# Patient Record
Sex: Male | Born: 1960 | Race: White | Hispanic: No | Marital: Married | Smoking: Current every day smoker
Health system: Southern US, Community
[De-identification: ages and names within clinical notes are randomized; demographics above are authoritative.]

## PROBLEM LIST (undated history)

## (undated) DIAGNOSIS — M5030 Other cervical disc degeneration, unspecified cervical region: Secondary | ICD-10-CM

## (undated) DIAGNOSIS — E785 Hyperlipidemia, unspecified: Secondary | ICD-10-CM

## (undated) DIAGNOSIS — I739 Peripheral vascular disease, unspecified: Secondary | ICD-10-CM

## (undated) DIAGNOSIS — I10 Essential (primary) hypertension: Secondary | ICD-10-CM

## (undated) DIAGNOSIS — I639 Cerebral infarction, unspecified: Secondary | ICD-10-CM

## (undated) DIAGNOSIS — I69320 Aphasia following cerebral infarction: Secondary | ICD-10-CM

## (undated) DIAGNOSIS — N529 Male erectile dysfunction, unspecified: Secondary | ICD-10-CM

## (undated) HISTORY — DX: Male erectile dysfunction, unspecified: N52.9

## (undated) HISTORY — DX: Cerebral infarction, unspecified: I63.9

## (undated) HISTORY — DX: Aphasia following cerebral infarction: I69.320

## (undated) HISTORY — DX: Other cervical disc degeneration, unspecified cervical region: M50.30

## (undated) HISTORY — DX: Peripheral vascular disease, unspecified: I73.9

## (undated) HISTORY — DX: Hyperlipidemia, unspecified: E78.5

---

## 2005-09-14 ENCOUNTER — Emergency Department: Payer: Self-pay | Admitting: Emergency Medicine

## 2012-08-14 ENCOUNTER — Emergency Department: Payer: Self-pay | Admitting: Unknown Physician Specialty

## 2012-08-14 LAB — URINALYSIS, COMPLETE
Bacteria: NONE SEEN
Bilirubin,UR: NEGATIVE
Glucose,UR: NEGATIVE mg/dL (ref 0–75)
Nitrite: NEGATIVE
Protein: NEGATIVE
RBC,UR: <1 /HPF (ref 0–5)
WBC UR: <1 /HPF (ref 0–5)

## 2012-08-14 LAB — COMPREHENSIVE METABOLIC PANEL
Alkaline Phosphatase: 84 U/L (ref 50–136)
Anion Gap: 8 (ref 7–16)
Chloride: 103 mmol/L (ref 98–107)
EGFR (Non-African Amer.): >60
Glucose: 84 mg/dL (ref 65–99)
Osmolality: 274 (ref 275–301)
Potassium: 3.9 mmol/L (ref 3.5–5.1)
SGOT(AST): 34 U/L (ref 15–37)
SGPT (ALT): 46 U/L (ref 12–78)
Sodium: 138 mmol/L (ref 136–145)
Total Protein: 8.5 g/dL — ABNORMAL HIGH (ref 6.4–8.2)

## 2012-08-14 LAB — CBC
HCT: 49 % (ref 40.0–52.0)
MCH: 31.5 pg (ref 26.0–34.0)
RDW: 13.5 % (ref 11.5–14.5)
WBC: 12.1 10*3/uL — ABNORMAL HIGH (ref 3.8–10.6)

## 2012-08-14 LAB — TROPONIN I: Troponin-I: <0.02 ng/mL

## 2012-08-16 ENCOUNTER — Ambulatory Visit: Payer: Self-pay | Admitting: Emergency Medicine

## 2012-08-27 ENCOUNTER — Ambulatory Visit: Payer: Self-pay | Admitting: Emergency Medicine

## 2015-06-30 ENCOUNTER — Other Ambulatory Visit: Payer: Self-pay | Admitting: Vascular Surgery

## 2015-07-20 ENCOUNTER — Other Ambulatory Visit
Admission: RE | Admit: 2015-07-20 | Discharge: 2015-07-20 | Disposition: A | Discharge status: Home / Self Care | Payer: BLUE CROSS/BLUE SHIELD | Source: Ambulatory Visit | Attending: Vascular Surgery | Admitting: Vascular Surgery

## 2015-07-20 DIAGNOSIS — Z029 Encounter for administrative examinations, unspecified: Secondary | ICD-10-CM | POA: Diagnosis not present

## 2015-07-20 LAB — BUN: BUN: 11 mg/dL (ref 6–20)

## 2015-07-20 LAB — CREATININE, SERUM
CREATININE: 0.8 mg/dL (ref 0.61–1.24)
GFR calc Af Amer: >60 mL/min (ref >60)

## 2015-07-21 ENCOUNTER — Ambulatory Visit
Admission: RE | Admit: 2015-07-21 | Discharge: 2015-07-21 | Disposition: A | Discharge status: Home / Self Care | Payer: BLUE CROSS/BLUE SHIELD | Source: Ambulatory Visit | Attending: Vascular Surgery | Admitting: Vascular Surgery

## 2015-07-21 ENCOUNTER — Encounter
Admission: RE | Disposition: A | Discharge status: Home / Self Care | Payer: Self-pay | Source: Ambulatory Visit | Attending: Vascular Surgery

## 2015-07-21 DIAGNOSIS — E785 Hyperlipidemia, unspecified: Secondary | ICD-10-CM | POA: Diagnosis not present

## 2015-07-21 DIAGNOSIS — I10 Essential (primary) hypertension: Secondary | ICD-10-CM | POA: Insufficient documentation

## 2015-07-21 DIAGNOSIS — Z79899 Other long term (current) drug therapy: Secondary | ICD-10-CM | POA: Insufficient documentation

## 2015-07-21 DIAGNOSIS — F17200 Nicotine dependence, unspecified, uncomplicated: Secondary | ICD-10-CM | POA: Insufficient documentation

## 2015-07-21 DIAGNOSIS — I70213 Atherosclerosis of native arteries of extremities with intermittent claudication, bilateral legs: Secondary | ICD-10-CM | POA: Insufficient documentation

## 2015-07-21 DIAGNOSIS — I120 Hypertensive chronic kidney disease with stage 5 chronic kidney disease or end stage renal disease: Secondary | ICD-10-CM | POA: Diagnosis not present

## 2015-07-21 DIAGNOSIS — I708 Atherosclerosis of other arteries: Secondary | ICD-10-CM | POA: Insufficient documentation

## 2015-07-21 HISTORY — PX: PERIPHERAL VASCULAR CATHETERIZATION: SHX172C

## 2015-07-21 SURGERY — ABDOMINAL AORTOGRAM W/LOWER EXTREMITY
Wound class: Clean

## 2015-07-21 MED ORDER — CLOPIDOGREL BISULFATE 75 MG PO TABS
300.0000 mg | ORAL_TABLET | ORAL | Status: AC
  Administered 2015-07-21: 300 mg via ORAL

## 2015-07-21 MED ORDER — DEXTROSE 5 % IV SOLN
INTRAVENOUS | Status: AC
  Filled 2015-07-21 (×12): qty 1.5

## 2015-07-21 MED ORDER — METHYLPREDNISOLONE SODIUM SUCC 125 MG IJ SOLR: 125.0000 mg | INTRAMUSCULAR | Status: DC | PRN

## 2015-07-21 MED ORDER — HEPARIN (PORCINE) IN NACL 2-0.9 UNIT/ML-% IJ SOLN
INTRAMUSCULAR | Status: AC
  Filled 2015-07-21: qty 1000

## 2015-07-21 MED ORDER — HYDROMORPHONE HCL 1 MG/ML IJ SOLN: 1.0000 mg | Freq: Once | INTRAMUSCULAR | Status: DC

## 2015-07-21 MED ORDER — DEXTROSE 5 % IV SOLN
1.5000 g | INTRAVENOUS | Status: AC
  Administered 2015-07-21: 1.5 g via INTRAVENOUS

## 2015-07-21 MED ORDER — FENTANYL CITRATE (PF) 100 MCG/2ML IJ SOLN
INTRAMUSCULAR | Status: DC | PRN
  Administered 2015-07-21: 50 ug via INTRAVENOUS
  Administered 2015-07-21: 100 ug via INTRAVENOUS
  Administered 2015-07-21: 50 ug via INTRAVENOUS

## 2015-07-21 MED ORDER — CLOPIDOGREL BISULFATE 75 MG PO TABS: 75.0000 mg | ORAL_TABLET | Freq: Every day | ORAL | Status: DC

## 2015-07-21 MED ORDER — MIDAZOLAM HCL 5 MG/5ML IJ SOLN
INTRAMUSCULAR | Status: AC
  Filled 2015-07-21: qty 5

## 2015-07-21 MED ORDER — FAMOTIDINE 20 MG PO TABS: 40.0000 mg | ORAL_TABLET | ORAL | Status: DC | PRN

## 2015-07-21 MED ORDER — SODIUM CHLORIDE 0.9 % IJ SOLN
INTRAMUSCULAR | Status: AC
  Filled 2015-07-21: qty 15

## 2015-07-21 MED ORDER — HEPARIN SODIUM (PORCINE) 1000 UNIT/ML IJ SOLN
INTRAMUSCULAR | Status: AC
  Filled 2015-07-21: qty 1

## 2015-07-21 MED ORDER — HEPARIN SODIUM (PORCINE) 1000 UNIT/ML IJ SOLN
INTRAMUSCULAR | Status: DC | PRN
  Administered 2015-07-21: 4000 [IU] via INTRAVENOUS

## 2015-07-21 MED ORDER — LIDOCAINE-EPINEPHRINE 1 %-1:100000 IJ SOLN
INTRAMUSCULAR | Status: DC | PRN
Start: 2015-07-21 — End: 2015-07-21
  Administered 2015-07-21: 10 mL

## 2015-07-21 MED ORDER — FENTANYL CITRATE (PF) 100 MCG/2ML IJ SOLN
INTRAMUSCULAR | Status: AC
  Filled 2015-07-21: qty 2

## 2015-07-21 MED ORDER — IOHEXOL 300 MG/ML  SOLN
INTRAMUSCULAR | Status: DC | PRN
Start: 2015-07-21 — End: 2015-07-21
  Administered 2015-07-21: 65 mL via INTRA_ARTERIAL

## 2015-07-21 MED ORDER — CLOPIDOGREL BISULFATE 75 MG PO TABS
ORAL_TABLET | ORAL | Status: AC
  Filled 2015-07-21: qty 4

## 2015-07-21 MED ORDER — ONDANSETRON HCL 4 MG/2ML IJ SOLN: 4.0000 mg | Freq: Four times a day (QID) | INTRAMUSCULAR | Status: DC | PRN

## 2015-07-21 MED ORDER — SODIUM CHLORIDE 0.9 % IV SOLN
INTRAVENOUS | Status: DC
Start: 2015-07-21 — End: 2015-07-21
  Administered 2015-07-21 (×2): via INTRAVENOUS

## 2015-07-21 MED ORDER — MIDAZOLAM HCL 2 MG/2ML IJ SOLN
INTRAMUSCULAR | Status: DC | PRN
  Administered 2015-07-21: 2 mg via INTRAVENOUS
  Administered 2015-07-21: 1 mg via INTRAVENOUS
  Administered 2015-07-21: 2 mg via INTRAVENOUS

## 2015-07-21 MED ORDER — LIDOCAINE HCL (PF) 1 % IJ SOLN
INTRAMUSCULAR | Status: AC
  Filled 2015-07-21: qty 30

## 2015-07-21 SURGICAL SUPPLY — 17 items
BALLN LUTONIX DCB 5X60X130 (BALLOONS) ×5
BALLN ULTRVRSE 6X60X130C (BALLOONS) ×5
BALLOON LUTONIX DCB 5X60X130 (BALLOONS) ×3 IMPLANT
BALLOON ULTRVRSE 6X60X130C (BALLOONS) ×3 IMPLANT
CATH PIG 70CM (CATHETERS) ×5 IMPLANT
DEVICE CLOSURE MYNXGRIP 5F (Vascular Products) ×5 IMPLANT
DEVICE PRESTO INFLATION (MISCELLANEOUS) ×5 IMPLANT
GLIDEWIRE ANGLED SS 035X260CM (WIRE) ×5 IMPLANT
LIFESTENT 6X60X130 (Permanent Stent) ×5 IMPLANT
PACK ANGIOGRAPHY (CUSTOM PROCEDURE TRAY) ×5 IMPLANT
SET INTRO CAPELLA COAXIAL (SET/KITS/TRAYS/PACK) ×5 IMPLANT
SHEATH BALKIN 6FR (SHEATH) ×5 IMPLANT
SHEATH BRITE TIP 5FRX11 (SHEATH) ×5 IMPLANT
SYR MEDRAD MARK V 150ML (SYRINGE) ×5 IMPLANT
TUBING CONTRAST HIGH PRESS 72 (TUBING) ×5 IMPLANT
WIRE J 3MM .035X145CM (WIRE) ×5 IMPLANT
WIRE MAGIC TORQUE 260C (WIRE) ×5 IMPLANT

## 2015-07-21 NOTE — Progress Notes (Signed)
Patient stable following procedure.  Voices no c/o of pain or discomfort.  Right groin site dressing C,D, and I.  Site w/o any swelling or drainage.  Dr. Gilda CreaseSchnier into see patient and talk with him.  Discharge instructions reviewed.  All questions answered.  No distress noted.  Wife into see patient and discharge instructions reviewed with her.

## 2015-07-21 NOTE — H&P (Signed)
[facility] History & Physical Update  The patient was interviewed and re-examined.  The patient's previous History and Physical has been reviewed and is unchanged.  There is no change in the plan of care. We plan to proceed with the scheduled procedure.  Tamarcus Condie, Latina CraverGregory G, MD  07/21/2015, 8:22 AM

## 2015-07-21 NOTE — Discharge Instructions (Signed)
Angiogram, Care After °Refer to this sheet in the next few weeks. These instructions provide you with information about caring for yourself after your procedure. Your health care provider may also give you more specific instructions. Your treatment has been planned according to current medical practices, but problems sometimes occur. Call your health care provider if you have any problems or questions after your procedure. °WHAT TO EXPECT AFTER THE PROCEDURE °After your procedure, it is typical to have the following: °· Bruising at the catheter insertion site that usually fades within 1-2 weeks. °· Blood collecting in the tissue (hematoma) that may be painful to the touch. It should usually decrease in size and tenderness within 1-2 weeks. °HOME CARE INSTRUCTIONS °· Take medicines only as directed by your health care provider. °· You may shower 24-48 hours after the procedure or as directed by your health care provider. Remove the bandage (dressing) and gently wash the site with plain soap and water. Pat the area dry with a clean towel. Do not rub the site, because this may cause bleeding. °· Do not take baths, swim, or use a hot tub until your health care provider approves. °· Check your insertion site every day for redness, swelling, or drainage. °· Do not apply powder or lotion to the site. °· Do not lift over 10 lb (4.5 kg) for 5 days after your procedure or as directed by your health care provider. °· Ask your health care provider when it is okay to: °¨ Return to work or school. °¨ Resume usual physical activities or sports. °¨ Resume sexual activity. °· Do not drive home if you are discharged the same day as the procedure. Have someone else drive you. °· You may drive 24 hours after the procedure unless otherwise instructed by your health care provider. °· Do not operate machinery or power tools for 24 hours after the procedure or as directed by your health care provider. °· If your procedure was done as an  outpatient procedure, which means that you went home the same day as your procedure, a responsible adult should be with you for the first 24 hours after you arrive home. °· Keep all follow-up visits as directed by your health care provider. This is important. °SEEK MEDICAL CARE IF: °· You have a fever. °· You have chills. °· You have increased bleeding from the catheter insertion site. Hold pressure on the site. °SEEK IMMEDIATE MEDICAL CARE IF: °· You have unusual pain at the catheter insertion site. °· You have redness, warmth, or swelling at the catheter insertion site. °· You have drainage (other than a small amount of blood on the dressing) from the catheter insertion site. °· The catheter insertion site is bleeding, and the bleeding does not stop after 30 minutes of holding steady pressure on the site. °· The area near or just beyond the catheter insertion site becomes pale, cool, tingly, or numb. °  °This information is not intended to replace advice given to you by your health care provider. Make sure you discuss any questions you have with your health care provider. °  °Document Released: 01/20/2005 Document Revised: 07/25/2014 Document Reviewed: 12/05/2012 °Elsevier Interactive Patient Education ©2016 [employer] ° °

## 2015-07-21 NOTE — Op Note (Signed)
Jonathan [name] Percutaneous Study/Intervention Procedural Note   Date of Surgery: 07/21/2015  Surgeon:  Katha Cabal, MD.  Pre-operative Diagnosis: Atherosclerotic occlusive disease bilateral lower extremities with lifestyle limiting claudication  Post-operative diagnosis: Same  Procedure(s) Performed: 1. Introduction catheter into left lower extremity 3rd order catheter placement  2. Contrast injection left lower extremity for distal runoff   3. Percutaneous transluminal angioplasty and stent placement left external iliac artery 4. Minx closure right common femoral arteriotomy  Anesthesia: Conscious sedation with IV Versed and fentanyl  Sheath: 6 [place] Balkan  Contrast: 65 cc  Fluoroscopy Time: 5.5 minutes  Indications: Jonathan [name] presents with lifestyle limiting claudication left leg is worse than the right. The risks and benefits are reviewed all questions answered patient agrees to proceed.  Procedure: Jonathan [name] is a 55 y.o. y.o. male who was identified and appropriate procedural time out was performed. The patient was then placed supine on the table and prepped and draped in the usual sterile fashion.   Ultrasound was placed in the sterile sleeve and the right groin was evaluated the right common femoral artery was echolucent and pulsatile indicating patency.  Image was recorded for the permanent record and under real-time visualization a microneedle was inserted into the common femoral artery microwire followed by a micro-sheath.  A J-wire was then advanced through the micro-sheath and a  5 [place] sheath was then inserted over a J-wire. J-wire was then advanced and a 5 French pigtail catheter was positioned at the level of T12. AP projection of the aorta was then obtained. Pigtail catheter was repositioned to above the bifurcation and a RAO view of the pelvis was obtained.   Subsequently a pigtail catheter with the stiff angle Glidewire was used to cross the aortic bifurcation the catheter wire were advanced down into the left distal external iliac artery. Oblique view of the femoral bifurcation was then obtained and subsequently the wire was reintroduced and the pigtail catheter negotiated into the SFA representing third order catheter placement. Distal runoff was then performed.  5000 units of heparin was then given and allowed to circulate and a 6 [place] Balkan sheath was advanced up and over the bifurcation and positioned in the femoral artery  A 5 x 60 Lutonix balloon was utilized inflating to 12 atm for 2 full minutes. Follow-up imaging demonstrated greater than 50% residual stenosis and therefore a 6 x 60 life stent was deployed and subsequently postdilated with a 6 x 60 balloon. Distal runoff was then reassessed.  After review of these images the sheath is pulled into the right external iliac oblique of the common femoral is obtained and a minx device deployed. There no immediate Complications.  Findings: Abdominal aorta is opacified with a bolus injection contrast. There are no hemodynamically significant lesions noted. Bilateral common iliacs are patent without hemodynamically significant stenosis. The external iliac arteries are noted to have high-grade stenosis bilaterally with a long segment string sign on the left and a more focal 70-80% narrowing on the right right at the ileo-inguinal ligament.  Left common femoral SFA popliteal and profunda femoris are widely patent. There appears to be three-vessel runoff to the foot although flow is very sluggish at the level of the ankle visualization is poor. Trifurcation however is widely patent.  The left external iliac is then angioplasty with a 5 mm Lutonix balloon subsequently a stent is placed for greater than 50% residual stenosis and postdilated to 6 mm with an excellent result  area  Right external iliac  lesion is not treated as it is too close to the actual puncture site to allow for safe treatment should the patient desired treatment of his right lower extremity I would wait a few months and then approach this lesion from the left femoral approach.   Disposition: Patient was taken to the recovery room in stable condition having tolerated the procedure well.  [name], Jonathan Lory 07/21/2015, 9:16 am

## 2015-07-22 ENCOUNTER — Encounter: Payer: Self-pay | Admitting: Vascular Surgery

## 2015-12-01 ENCOUNTER — Ambulatory Visit
Admission: RE | Admit: 2015-12-01 | Discharge: 2015-12-01 | Disposition: A | Discharge status: Home / Self Care | Payer: BLUE CROSS/BLUE SHIELD | Source: Ambulatory Visit | Attending: Family Medicine | Admitting: Family Medicine

## 2015-12-01 ENCOUNTER — Other Ambulatory Visit: Payer: Self-pay | Admitting: Family Medicine

## 2015-12-01 DIAGNOSIS — M542 Cervicalgia: Secondary | ICD-10-CM | POA: Insufficient documentation

## 2016-01-07 DIAGNOSIS — M542 Cervicalgia: Secondary | ICD-10-CM | POA: Insufficient documentation

## 2016-01-07 DIAGNOSIS — M5030 Other cervical disc degeneration, unspecified cervical region: Secondary | ICD-10-CM | POA: Insufficient documentation

## 2016-08-15 ENCOUNTER — Other Ambulatory Visit (INDEPENDENT_AMBULATORY_CARE_PROVIDER_SITE_OTHER): Payer: Self-pay | Admitting: Vascular Surgery

## 2016-10-25 DIAGNOSIS — I739 Peripheral vascular disease, unspecified: Secondary | ICD-10-CM | POA: Insufficient documentation

## 2017-03-16 ENCOUNTER — Other Ambulatory Visit (INDEPENDENT_AMBULATORY_CARE_PROVIDER_SITE_OTHER): Payer: Self-pay | Admitting: Vascular Surgery

## 2017-06-15 ENCOUNTER — Other Ambulatory Visit (INDEPENDENT_AMBULATORY_CARE_PROVIDER_SITE_OTHER): Payer: Self-pay | Admitting: Vascular Surgery

## 2019-09-17 ENCOUNTER — Other Ambulatory Visit: Payer: Self-pay

## 2019-09-17 ENCOUNTER — Emergency Department: Payer: BC Managed Care – PPO

## 2019-09-17 ENCOUNTER — Encounter: Payer: Self-pay | Admitting: Emergency Medicine

## 2019-09-17 ENCOUNTER — Emergency Department
Admission: EM | Admit: 2019-09-17 | Discharge: 2019-09-17 | Disposition: A | Discharge status: Home / Self Care | Payer: BC Managed Care – PPO | Attending: Emergency Medicine | Admitting: Emergency Medicine

## 2019-09-17 DIAGNOSIS — Z79899 Other long term (current) drug therapy: Secondary | ICD-10-CM | POA: Insufficient documentation

## 2019-09-17 DIAGNOSIS — Z7901 Long term (current) use of anticoagulants: Secondary | ICD-10-CM | POA: Diagnosis not present

## 2019-09-17 DIAGNOSIS — I63412 Cerebral infarction due to embolism of left middle cerebral artery: Secondary | ICD-10-CM | POA: Diagnosis not present

## 2019-09-17 DIAGNOSIS — I10 Essential (primary) hypertension: Secondary | ICD-10-CM | POA: Diagnosis not present

## 2019-09-17 DIAGNOSIS — F17210 Nicotine dependence, cigarettes, uncomplicated: Secondary | ICD-10-CM | POA: Diagnosis not present

## 2019-09-17 DIAGNOSIS — R4781 Slurred speech: Secondary | ICD-10-CM | POA: Diagnosis present

## 2019-09-17 DIAGNOSIS — Z20822 Contact with and (suspected) exposure to covid-19: Secondary | ICD-10-CM | POA: Diagnosis not present

## 2019-09-17 HISTORY — DX: Essential (primary) hypertension: I10

## 2019-09-17 LAB — PROTIME-INR
INR: 0.9 (ref 0.8–1.2)
Prothrombin Time: 12 seconds (ref 11.4–15.2)

## 2019-09-17 LAB — DIFFERENTIAL
Abs Immature Granulocytes: 0.04 10*3/uL (ref 0.00–0.07)
Basophils Absolute: 0.1 10*3/uL (ref 0.0–0.1)
Basophils Relative: 1 %
Eosinophils Absolute: 0.1 10*3/uL (ref 0.0–0.5)
Eosinophils Relative: 1 %
Immature Granulocytes: 0 %
Lymphocytes Relative: 36 %
Lymphs Abs: 3.3 10*3/uL (ref 0.7–4.0)
Monocytes Absolute: 0.9 10*3/uL (ref 0.1–1.0)
Monocytes Relative: 10 %
Neutro Abs: 4.7 10*3/uL (ref 1.7–7.7)
Neutrophils Relative %: 52 %

## 2019-09-17 LAB — CBC
HCT: 51.5 % (ref 39.0–52.0)
Hemoglobin: 17.7 g/dL — ABNORMAL HIGH (ref 13.0–17.0)
MCH: 31.6 pg (ref 26.0–34.0)
MCHC: 34.4 g/dL (ref 30.0–36.0)
MCV: 92 fL (ref 80.0–100.0)
Platelets: 238 10*3/uL (ref 150–400)
RBC: 5.6 MIL/uL (ref 4.22–5.81)
RDW: 12.7 % (ref 11.5–15.5)
WBC: 9.1 10*3/uL (ref 4.0–10.5)
nRBC: 0 % (ref 0.0–0.2)

## 2019-09-17 LAB — COMPREHENSIVE METABOLIC PANEL
ALT: 55 U/L — ABNORMAL HIGH (ref 0–44)
AST: 48 U/L — ABNORMAL HIGH (ref 15–41)
Albumin: 4.5 g/dL (ref 3.5–5.0)
Alkaline Phosphatase: 59 U/L (ref 38–126)
Anion gap: 13 (ref 5–15)
BUN: 11 mg/dL (ref 6–20)
CO2: 26 mmol/L (ref 22–32)
Calcium: 9.9 mg/dL (ref 8.9–10.3)
Chloride: 101 mmol/L (ref 98–111)
Creatinine, Ser: 0.82 mg/dL (ref 0.61–1.24)
GFR calc Af Amer: >60 mL/min (ref >60)
GFR calc non Af Amer: >60 mL/min (ref >60)
Glucose, Bld: 146 mg/dL — ABNORMAL HIGH (ref 70–99)
Potassium: 3.9 mmol/L (ref 3.5–5.1)
Sodium: 140 mmol/L (ref 135–145)
Total Bilirubin: 0.7 mg/dL (ref 0.3–1.2)
Total Protein: 8.2 g/dL — ABNORMAL HIGH (ref 6.5–8.1)

## 2019-09-17 LAB — SARS CORONAVIRUS 2 (TAT 6-24 HRS): SARS Coronavirus 2: NEGATIVE

## 2019-09-17 LAB — GLUCOSE, CAPILLARY: Glucose-Capillary: 129 mg/dL — ABNORMAL HIGH (ref 70–99)

## 2019-09-17 LAB — APTT: aPTT: 31 seconds (ref 24–36)

## 2019-09-17 MED ORDER — ASPIRIN EC 325 MG PO TBEC: 325.0000 mg | DELAYED_RELEASE_TABLET | Freq: Every day | ORAL | 0 refills | Status: DC

## 2019-09-17 MED ORDER — ASPIRIN 81 MG PO CHEW
324.0000 mg | CHEWABLE_TABLET | Freq: Once | ORAL | Status: AC
  Administered 2019-09-17: 324 mg via ORAL
  Filled 2019-09-17: qty 4

## 2019-09-17 MED ORDER — SODIUM CHLORIDE 0.9% FLUSH: 3.0000 mL | Freq: Once | INTRAVENOUS | Status: DC

## 2019-09-17 NOTE — ED Notes (Signed)
Patient on phone with MRI tech at this time 

## 2019-09-17 NOTE — ED Provider Notes (Signed)
[facility] Emergency Department Provider Note  ____________________________________________  Time seen: Approximately 1:11 PM  I have reviewed the triage vital signs and the nursing notes.   HISTORY  Chief Complaint Aphasia  Level 5 Caveat: Portions of the History and Physical including HPI and review of systems are unable to be completely obtained due to patient being a poor historian  Additional history obtained from wife at bedside   HPI Jonathan [name] is a 59 y.o. male with a history of hypertension who comes the ED complaining of slurred speech, incoherent speech.   Wife reports this is been going on for 2 days, intermittent, waxing waning, no aggravating or alleviating factors.  Patient denies any lateralizing weakness vision changes or headaches.  No trauma.  Wife notes that during these episodes he appears to have some facial droop as well but she does not remember which side  Wife notes that patient's been working extra shifts lately because 2 of his coworkers have had Covid and so he has been working extra while they are out.   Past Medical History:  Diagnosis Date  . Hypertension      There are no problems to display for this patient.    Past Surgical History:  Procedure Laterality Date  . PERIPHERAL VASCULAR CATHETERIZATION N/A 07/21/2015   Procedure: Abdominal Aortogram w/Lower Extremity;  Surgeon: Katha Cabal, MD;  Location: [facility] [facility];  Service: Cardiovascular;  Laterality: N/A;  . PERIPHERAL VASCULAR CATHETERIZATION  07/21/2015   Procedure: Lower Extremity Intervention;  Surgeon: Katha Cabal, MD;  Location: [place] [facility];  Service: Cardiovascular;;     Prior to Admission medications   Medication Sig Start Date End Date Taking? Authorizing Provider  amLODipine-benazepril (LOTREL) 10-20 MG capsule Take 1 capsule by mouth daily.   Yes [provider]  atenolol (TENORMIN) 50 MG tablet Take 50 mg by  mouth daily.   Yes [provider]  atorvastatin (LIPITOR) 40 MG tablet Take 40 mg by mouth daily. 04/29/19  Yes [provider]  clopidogrel (PLAVIX) 75 MG tablet TAKE 1 TABLET BY MOUTH EVERY DAY 06/15/17  Yes [name], [name], MD  aspirin EC 325 MG tablet Take 1 tablet (325 mg total) by mouth daily. 09/17/19   Carrie Mew, MD     Allergies Patient has no known allergies.   No family history on file.  Social History Social History   Tobacco Use  . Smoking status: Current Every Day Smoker    Types: Cigarettes  . Smokeless tobacco: Never Used  Substance Use Topics  . Alcohol use: Not on file  . Drug use: Not on file    Review of Systems  Constitutional:   No fever or chills.  ENT:   No sore throat. No rhinorrhea. Cardiovascular:   No chest pain or syncope. Respiratory:   No dyspnea or cough. Gastrointestinal:   Negative for abdominal pain, vomiting and diarrhea.  Musculoskeletal:   Negative for focal pain or swelling All other systems reviewed and are negative except as documented above in ROS and HPI.  ____________________________________________   PHYSICAL EXAM:  VITAL SIGNS: ED Triage Vitals  Enc Vitals Group     BP 09/17/19 0925 (!) 199/96     Pulse Rate 09/17/19 0925 88     Resp 09/17/19 0925 16     Temp 09/17/19 0925 98.2 F (36.8 C)     Temp Source 09/17/19 0925 Oral     SpO2 09/17/19 0925 97 %  Weight 09/17/19 0922 177 lb 14.6 oz (80.7 kg)     Height 09/17/19 0922 5\' 7"  (1.702 m)     Head Circumference --      Peak Flow --      Pain Score 09/17/19 0922 0     Pain Loc --      Pain Edu? --      Excl. in GC? --     Vital signs reviewed, nursing assessments reviewed.   Constitutional:   Alert and oriented. Non-toxic appearance. Eyes:   Conjunctivae are normal. EOMI. PERRL. ENT      Head:   Normocephalic and atraumatic.      Nose:   Wearing a mask.      Mouth/Throat:   Wearing a mask.      Neck:   No meningismus. Full  ROM. Hematological/Lymphatic/Immunilogical:   No cervical lymphadenopathy. Cardiovascular:   RRR. Symmetric bilateral radial and DP pulses.  No murmurs. Cap refill less than 2 seconds. Respiratory:   Normal respiratory effort without tachypnea/retractions. Breath sounds are clear and equal bilaterally. No wheezes/rales/rhonchi. Gastrointestinal:   Soft and nontender. Non distended. There is no CVA tenderness.  No rebound, rigidity, or guarding. Genitourinary:   deferred Musculoskeletal:   Normal range of motion in all extremities. No joint effusions.  No lower extremity tenderness.  No edema. Neurologic:   Normal speech and language.  Cranial nerves III through XII intact Motor grossly intact.  No drift Normal finger-to-nose, normal heel-to-shin, normal gait No acute focal neurologic deficits are appreciated.  Skin:    Skin is warm, dry and intact. No rash noted.  No petechiae, purpura, or bullae.  ____________________________________________    LABS (pertinent positives/negatives) (all labs ordered are listed, but only abnormal results are displayed) Labs Reviewed  CBC - Abnormal; Notable for the following components:      Result Value   Hemoglobin 17.7 (*)    All other components within normal limits  COMPREHENSIVE METABOLIC PANEL - Abnormal; Notable for the following components:   Glucose, Bld 146 (*)    Total Protein 8.2 (*)    AST 48 (*)    ALT 55 (*)    All other components within normal limits  GLUCOSE, CAPILLARY - Abnormal; Notable for the following components:   Glucose-Capillary 129 (*)    All other components within normal limits  SARS CORONAVIRUS 2 (TAT 6-24 HRS)  PROTIME-INR  APTT  DIFFERENTIAL  CBG MONITORING, ED   ____________________________________________   EKG  interpreted by me Sinus rhythm rate of 83, normal axis and intervals.  Poor R wave progression.  Normal ST segments and T waves.  No ischemic  changes.  ____________________________________________    RADIOLOGY  CT HEAD WO CONTRAST  Result Date: 09/17/2019 CLINICAL DATA:  59 year old male with history of focal neurologic deficit. Suspected stroke. EXAM: CT HEAD WITHOUT CONTRAST TECHNIQUE: Contiguous axial images were obtained from the base of the skull through the vertex without intravenous contrast. COMPARISON:  No priors. FINDINGS: Brain: Patchy and confluent areas of decreased attenuation are noted throughout the deep and periventricular white matter of the cerebral hemispheres bilaterally, compatible with chronic microvascular ischemic disease. No evidence of acute infarction, hemorrhage, hydrocephalus, extra-axial collection or mass lesion/mass effect. Vascular: No hyperdense vessel or unexpected calcification. Skull: Normal. Negative for fracture or focal lesion. Sinuses/Orbits: No acute finding. Other: None. IMPRESSION: 1. No acute intracranial abnormalities. 2. Mild chronic microvascular ischemic changes in the cerebral white matter, as above. Electronically Signed   By: 41  Entrikin M.D.   On: 09/17/2019 09:48   MR BRAIN WO CONTRAST  Result Date: 09/17/2019 CLINICAL DATA:  Transient ischemic attack (TIA). Additional history provided: Patient with slurred speech for 2 days EXAM: MRI HEAD WITHOUT CONTRAST TECHNIQUE: Multiplanar, multiecho pulse sequences of the brain and surrounding structures were obtained without intravenous contrast. COMPARISON:  Head CT 09/17/2019 FINDINGS: Brain: There are multiple small foci of restricted diffusion consistent with acute infarction within the left frontoparietal white matter. The largest infarct within the left parietal white matter measures 15 mm. Corresponding T2/FLAIR hyperintensity at these sites. No evidence of hemorrhagic conversion. No significant mass effect. No midline shift or extra-axial fluid collection. No evidence of intracranial mass. There is a small chronic cortical infarct  within the left middle frontal gyrus (series 15, images 37-38). Mild generalized parenchymal atrophy. Vascular: Flow voids maintained within the proximal large arterial vessels. Skull and upper cervical spine: No focal marrow lesion. Sinuses/Orbits: Visualized orbits demonstrate no acute abnormality. Minimal paranasal sinus mucosal thickening. No significant mastoid effusion. IMPRESSION: Multiple small acute infarcts within the left frontoparietal white matter within the left MCA vascular territory. These infarcts measure up to 15 mm. No hemorrhagic conversion or significant mass effect. Small chronic cortical infarct within the left superior frontal gyrus. Mild generalized parenchymal atrophy. Electronically Signed   By: Jackey Loge DO   On: 09/17/2019 12:40    ____________________________________________   PROCEDURES Procedures  ____________________________________________  DIFFERENTIAL DIAGNOSIS   Acute ischemic stroke, fatigue, anxiety  CLINICAL IMPRESSION / ASSESSMENT AND PLAN / ED COURSE  Medications ordered in the ED: Medications  aspirin chewable tablet 324 mg (324 mg Oral Given 09/17/19 1309)    Pertinent labs & imaging results that were available during my care of the patient were reviewed by me and considered in my medical decision making (see chart for details).  Jonathan [name] was evaluated in Emergency Department on 09/17/2019 for the symptoms described in the history of present illness. He was evaluated in the context of the global COVID-19 pandemic, which necessitated consideration that the patient might be at risk for infection with the SARS-CoV-2 virus that causes COVID-19. Institutional protocols and algorithms that pertain to the evaluation of patients at risk for COVID-19 are in a state of rapid change based on information released by regulatory bodies including the CDC and federal and state organizations. These policies and algorithms were followed during the patient's  care in the ED.     Clinical Course as of Sep 16 1316  Tue Sep 17, 2019  1052 CT head is unremarkable. Labs unremarkable. He is neurologically intact on exam, back to baseline according to spouse. Possible TIA versus fatigue versus anxiety attacks. I will obtain an MRI of the brain, if no signs of stroke with plan to discharge to outpatient follow-up.   [PS]    Clinical Course User Index [PS] Sharman Cheek, MD     ----------------------------------------- 1:17 PM on 09/17/2019 -----------------------------------------  MRI positive for multiple small acute strokes in the left MCA distribution, concerning for embolic phenomena versus a single stroke that dispersed in the small vessels.  Recommended admission given this finding for further work-up and telemetry monitoring, patient declines wishes to go home.  We will start him on full dose aspirin, strongly encouraged him to follow-up with neurology this week for further assessment, return precautions for any worsening or recurrent symptoms to come back to the ED via 911 immediately.  He does have medical decision-making capacity at this time, and is  in company of his wife, and and they both agree that he would not be admitted right now and chooses to go home.  Will discharge AGAINST MEDICAL ADVICE.  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 Dictation errors may occur despite best attempts at proofreading.   Sharman Cheek, MD 09/17/19 1318

## 2019-09-17 NOTE — ED Notes (Signed)
Per MD [address], no code stroke workup

## 2019-09-17 NOTE — Discharge Instructions (Signed)
Your MRI today shows multiple small strokes, which are the cause of your symptoms.  You should follow up with neurology and primary care as soon as possible to continue evaluating the causes of your strokes and work to prevent any future strokes.  Please call 911 and return to the emergency department immediately if you have any recurrent symptoms including difficulty speaking, confusion, weakness on one side of the body, loss of vision, or other new concerns.  Results for orders placed or performed during the hospital encounter of 09/17/19  Protime-INR  Result Value Ref Range   Prothrombin Time 12.0 11.4 - 15.2 seconds   INR 0.9 0.8 - 1.2  APTT  Result Value Ref Range   aPTT 31 24 - 36 seconds  CBC  Result Value Ref Range   WBC 9.1 4.0 - 10.5 K/uL   RBC 5.60 4.22 - 5.81 MIL/uL   Hemoglobin 17.7 (H) 13.0 - 17.0 g/dL   HCT 91.4 78.2 - 95.6 %   MCV 92.0 80.0 - 100.0 fL   MCH 31.6 26.0 - 34.0 pg   MCHC 34.4 30.0 - 36.0 g/dL   RDW 21.3 08.6 - 57.8 %   Platelets 238 150 - 400 K/uL   nRBC 0.0 0.0 - 0.2 %  Differential  Result Value Ref Range   Neutrophils Relative % 52 %   Neutro Abs 4.7 1.7 - 7.7 K/uL   Lymphocytes Relative 36 %   Lymphs Abs 3.3 0.7 - 4.0 K/uL   Monocytes Relative 10 %   Monocytes Absolute 0.9 0.1 - 1.0 K/uL   Eosinophils Relative 1 %   Eosinophils Absolute 0.1 0.0 - 0.5 K/uL   Basophils Relative 1 %   Basophils Absolute 0.1 0.0 - 0.1 K/uL   Immature Granulocytes 0 %   Abs Immature Granulocytes 0.04 0.00 - 0.07 K/uL  Comprehensive metabolic panel  Result Value Ref Range   Sodium 140 135 - 145 mmol/L   Potassium 3.9 3.5 - 5.1 mmol/L   Chloride 101 98 - 111 mmol/L   CO2 26 22 - 32 mmol/L   Glucose, Bld 146 (H) 70 - 99 mg/dL   BUN 11 6 - 20 mg/dL   Creatinine, Ser 4.69 0.61 - 1.24 mg/dL   Calcium 9.9 8.9 - 62.9 mg/dL   Total Protein 8.2 (H) 6.5 - 8.1 g/dL   Albumin 4.5 3.5 - 5.0 g/dL   AST 48 (H) 15 - 41 U/L   ALT 55 (H) 0 - 44 U/L   Alkaline Phosphatase 59  38 - 126 U/L   Total Bilirubin 0.7 0.3 - 1.2 mg/dL   GFR calc non Af Amer >60 >60 mL/min   GFR calc Af Amer >60 >60 mL/min   Anion gap 13 5 - 15  Glucose, capillary  Result Value Ref Range   Glucose-Capillary 129 (H) 70 - 99 mg/dL   CT HEAD WO CONTRAST  Result Date: 09/17/2019 CLINICAL DATA:  59 year old male with history of focal neurologic deficit. Suspected stroke. EXAM: CT HEAD WITHOUT CONTRAST TECHNIQUE: Contiguous axial images were obtained from the base of the skull through the vertex without intravenous contrast. COMPARISON:  No priors. FINDINGS: Brain: Patchy and confluent areas of decreased attenuation are noted throughout the deep and periventricular white matter of the cerebral hemispheres bilaterally, compatible with chronic microvascular ischemic disease. No evidence of acute infarction, hemorrhage, hydrocephalus, extra-axial collection or mass lesion/mass effect. Vascular: No hyperdense vessel or unexpected calcification. Skull: Normal. Negative for fracture or focal lesion. Sinuses/Orbits: No  acute finding. Other: None. IMPRESSION: 1. No acute intracranial abnormalities. 2. Mild chronic microvascular ischemic changes in the cerebral white matter, as above. Electronically Signed   By: Vinnie Langton M.D.   On: 09/17/2019 09:48   MR BRAIN WO CONTRAST  Result Date: 09/17/2019 CLINICAL DATA:  Transient ischemic attack (TIA). Additional history provided: Patient with slurred speech for 2 days EXAM: MRI HEAD WITHOUT CONTRAST TECHNIQUE: Multiplanar, multiecho pulse sequences of the brain and surrounding structures were obtained without intravenous contrast. COMPARISON:  Head CT 09/17/2019 FINDINGS: Brain: There are multiple small foci of restricted diffusion consistent with acute infarction within the left frontoparietal white matter. The largest infarct within the left parietal white matter measures 15 mm. Corresponding T2/FLAIR hyperintensity at these sites. No evidence of hemorrhagic  conversion. No significant mass effect. No midline shift or extra-axial fluid collection. No evidence of intracranial mass. There is a small chronic cortical infarct within the left middle frontal gyrus (series 15, images 37-38). Mild generalized parenchymal atrophy. Vascular: Flow voids maintained within the proximal large arterial vessels. Skull and upper cervical spine: No focal marrow lesion. Sinuses/Orbits: Visualized orbits demonstrate no acute abnormality. Minimal paranasal sinus mucosal thickening. No significant mastoid effusion. IMPRESSION: Multiple small acute infarcts within the left frontoparietal white matter within the left MCA vascular territory. These infarcts measure up to 15 mm. No hemorrhagic conversion or significant mass effect. Small chronic cortical infarct within the left superior frontal gyrus. Mild generalized parenchymal atrophy. Electronically Signed   By: Kellie Simmering DO   On: 09/17/2019 12:40

## 2019-09-17 NOTE — ED Notes (Signed)
Rainbow was drawn and sent to lab. ?

## 2019-09-17 NOTE — ED Triage Notes (Addendum)
C/O slurred speech x 2 days.  Patient states he has history of shuddering, but at work people are telling him that his speech is slurred.  Patient states he just wants a COVID test today.  AAOx3.  Skin warm and dry. MAE equally and strong.  Facial movements equal.  Speech is slurred, difficult to understand at times.  Patient states he recently restarted blood pressure medication.

## 2019-09-29 DIAGNOSIS — I10 Essential (primary) hypertension: Secondary | ICD-10-CM | POA: Insufficient documentation

## 2019-09-29 DIAGNOSIS — Z8673 Personal history of transient ischemic attack (TIA), and cerebral infarction without residual deficits: Secondary | ICD-10-CM | POA: Insufficient documentation

## 2019-09-29 DIAGNOSIS — I69320 Aphasia following cerebral infarction: Secondary | ICD-10-CM | POA: Insufficient documentation

## 2019-10-03 ENCOUNTER — Ambulatory Visit: Payer: BC Managed Care – PPO | Attending: Neurology | Admitting: Physical Therapy

## 2020-02-20 ENCOUNTER — Other Ambulatory Visit (INDEPENDENT_AMBULATORY_CARE_PROVIDER_SITE_OTHER): Payer: Self-pay | Admitting: Nurse Practitioner

## 2020-02-20 DIAGNOSIS — I739 Peripheral vascular disease, unspecified: Secondary | ICD-10-CM

## 2020-02-20 DIAGNOSIS — R230 Cyanosis: Secondary | ICD-10-CM

## 2020-02-21 ENCOUNTER — Inpatient Hospital Stay
Admission: RE | Admit: 2020-02-21 | Discharge: 2020-02-23 | DRG: 252 | Disposition: A | Discharge status: Other Acute Inpatient Hospital | Payer: BC Managed Care – PPO | Source: Ambulatory Visit | Attending: Surgery | Admitting: Surgery

## 2020-02-21 ENCOUNTER — Other Ambulatory Visit (INDEPENDENT_AMBULATORY_CARE_PROVIDER_SITE_OTHER): Payer: Self-pay | Admitting: Nurse Practitioner

## 2020-02-21 ENCOUNTER — Encounter (INDEPENDENT_AMBULATORY_CARE_PROVIDER_SITE_OTHER): Payer: Self-pay

## 2020-02-21 ENCOUNTER — Encounter (INDEPENDENT_AMBULATORY_CARE_PROVIDER_SITE_OTHER): Payer: Self-pay | Admitting: Nurse Practitioner

## 2020-02-21 ENCOUNTER — Encounter
Admission: RE | Disposition: A | Discharge status: Other Acute Inpatient Hospital | Payer: Self-pay | Source: Ambulatory Visit | Attending: Surgery

## 2020-02-21 ENCOUNTER — Ambulatory Visit (INDEPENDENT_AMBULATORY_CARE_PROVIDER_SITE_OTHER): Payer: BC Managed Care – PPO | Admitting: Nurse Practitioner

## 2020-02-21 ENCOUNTER — Other Ambulatory Visit: Payer: Self-pay

## 2020-02-21 ENCOUNTER — Ambulatory Visit (INDEPENDENT_AMBULATORY_CARE_PROVIDER_SITE_OTHER): Payer: BC Managed Care – PPO

## 2020-02-21 VITALS — BP 212/97 | HR 87 | Resp 16 | Ht 67.0 in | Wt 175.0 lb

## 2020-02-21 DIAGNOSIS — Z20822 Contact with and (suspected) exposure to covid-19: Secondary | ICD-10-CM | POA: Diagnosis present

## 2020-02-21 DIAGNOSIS — I6529 Occlusion and stenosis of unspecified carotid artery: Secondary | ICD-10-CM

## 2020-02-21 DIAGNOSIS — R230 Cyanosis: Secondary | ICD-10-CM | POA: Diagnosis not present

## 2020-02-21 DIAGNOSIS — I70222 Atherosclerosis of native arteries of extremities with rest pain, left leg: Secondary | ICD-10-CM

## 2020-02-21 DIAGNOSIS — I70201 Unspecified atherosclerosis of native arteries of extremities, right leg: Secondary | ICD-10-CM

## 2020-02-21 DIAGNOSIS — I10 Essential (primary) hypertension: Secondary | ICD-10-CM | POA: Diagnosis not present

## 2020-02-21 DIAGNOSIS — G8191 Hemiplegia, unspecified affecting right dominant side: Secondary | ICD-10-CM | POA: Diagnosis not present

## 2020-02-21 DIAGNOSIS — F17210 Nicotine dependence, cigarettes, uncomplicated: Secondary | ICD-10-CM | POA: Diagnosis present

## 2020-02-21 DIAGNOSIS — Z720 Tobacco use: Secondary | ICD-10-CM | POA: Diagnosis not present

## 2020-02-21 DIAGNOSIS — E785 Hyperlipidemia, unspecified: Secondary | ICD-10-CM | POA: Diagnosis not present

## 2020-02-21 DIAGNOSIS — I739 Peripheral vascular disease, unspecified: Secondary | ICD-10-CM | POA: Diagnosis not present

## 2020-02-21 DIAGNOSIS — I743 Embolism and thrombosis of arteries of the lower extremities: Secondary | ICD-10-CM | POA: Diagnosis present

## 2020-02-21 DIAGNOSIS — Z978 Presence of other specified devices: Secondary | ICD-10-CM

## 2020-02-21 DIAGNOSIS — R4701 Aphasia: Secondary | ICD-10-CM | POA: Diagnosis not present

## 2020-02-21 DIAGNOSIS — I745 Embolism and thrombosis of iliac artery: Secondary | ICD-10-CM | POA: Diagnosis present

## 2020-02-21 DIAGNOSIS — T82868A Thrombosis of vascular prosthetic devices, implants and grafts, initial encounter: Secondary | ICD-10-CM | POA: Diagnosis not present

## 2020-02-21 DIAGNOSIS — I63232 Cerebral infarction due to unspecified occlusion or stenosis of left carotid arteries: Secondary | ICD-10-CM | POA: Diagnosis not present

## 2020-02-21 DIAGNOSIS — Z825 Family history of asthma and other chronic lower respiratory diseases: Secondary | ICD-10-CM

## 2020-02-21 DIAGNOSIS — I998 Other disorder of circulatory system: Secondary | ICD-10-CM

## 2020-02-21 DIAGNOSIS — I70229 Atherosclerosis of native arteries of extremities with rest pain, unspecified extremity: Secondary | ICD-10-CM | POA: Diagnosis present

## 2020-02-21 DIAGNOSIS — Z8249 Family history of ischemic heart disease and other diseases of the circulatory system: Secondary | ICD-10-CM

## 2020-02-21 DIAGNOSIS — I70223 Atherosclerosis of native arteries of extremities with rest pain, bilateral legs: Principal | ICD-10-CM | POA: Diagnosis present

## 2020-02-21 DIAGNOSIS — Z95820 Peripheral vascular angioplasty status with implants and grafts: Secondary | ICD-10-CM

## 2020-02-21 DIAGNOSIS — Z9119 Patient's noncompliance with other medical treatment and regimen: Secondary | ICD-10-CM

## 2020-02-21 DIAGNOSIS — R29722 NIHSS score 22: Secondary | ICD-10-CM | POA: Diagnosis not present

## 2020-02-21 HISTORY — PX: LOWER EXTREMITY ANGIOGRAPHY: CATH118251

## 2020-02-21 LAB — CREATININE, SERUM
Creatinine, Ser: 0.68 mg/dL (ref 0.61–1.24)
GFR calc Af Amer: >60 mL/min (ref >60)
GFR calc non Af Amer: >60 mL/min (ref >60)

## 2020-02-21 LAB — SARS CORONAVIRUS 2 BY RT PCR (HOSPITAL ORDER, PERFORMED IN ~~LOC~~ HOSPITAL LAB): SARS Coronavirus 2: NEGATIVE

## 2020-02-21 LAB — GLUCOSE, CAPILLARY: Glucose-Capillary: 241 mg/dL — ABNORMAL HIGH (ref 70–99)

## 2020-02-21 LAB — BUN: BUN: 13 mg/dL (ref 6–20)

## 2020-02-21 SURGERY — LOWER EXTREMITY ANGIOGRAPHY
Anesthesia: Moderate Sedation | Site: Leg Lower | Laterality: Left

## 2020-02-21 MED ORDER — ATORVASTATIN CALCIUM 20 MG PO TABS
40.0000 mg | ORAL_TABLET | Freq: Every day | ORAL | Status: DC
  Administered 2020-02-22: 40 mg via ORAL
  Filled 2020-02-21 (×2): qty 2

## 2020-02-21 MED ORDER — NICOTINE 21 MG/24HR TD PT24
21.0000 mg | MEDICATED_PATCH | Freq: Every day | TRANSDERMAL | Status: DC
  Administered 2020-02-22 – 2020-02-23 (×2): 21 mg via TRANSDERMAL
  Filled 2020-02-21 (×2): qty 1

## 2020-02-21 MED ORDER — DIPHENHYDRAMINE HCL 50 MG/ML IJ SOLN
INTRAMUSCULAR | Status: DC | PRN
  Administered 2020-02-21: 25 mg via INTRAVENOUS

## 2020-02-21 MED ORDER — MIDAZOLAM HCL 5 MG/5ML IJ SOLN
INTRAMUSCULAR | Status: AC
  Filled 2020-02-21: qty 5

## 2020-02-21 MED ORDER — ALBUTEROL SULFATE (2.5 MG/3ML) 0.083% IN NEBU: 3.0000 mL | INHALATION_SOLUTION | Freq: Four times a day (QID) | RESPIRATORY_TRACT | Status: DC | PRN

## 2020-02-21 MED ORDER — OXYCODONE HCL 5 MG PO TABS: 5.0000 mg | ORAL_TABLET | Freq: Four times a day (QID) | ORAL | Status: DC | PRN

## 2020-02-21 MED ORDER — HEPARIN (PORCINE) 25000 UT/250ML-% IV SOLN: 1300.0000 [IU]/h | INTRAVENOUS | Status: DC

## 2020-02-21 MED ORDER — HEPARIN SODIUM (PORCINE) 1000 UNIT/ML IJ SOLN
INTRAMUSCULAR | Status: DC | PRN
  Administered 2020-02-21: 5000 [IU] via INTRAVENOUS

## 2020-02-21 MED ORDER — BENAZEPRIL HCL 20 MG PO TABS
20.0000 mg | ORAL_TABLET | Freq: Every day | ORAL | Status: DC
  Filled 2020-02-21 (×3): qty 1

## 2020-02-21 MED ORDER — HYDROMORPHONE HCL 1 MG/ML IJ SOLN: 1.0000 mg | Freq: Once | INTRAMUSCULAR | Status: DC | PRN

## 2020-02-21 MED ORDER — ACETAMINOPHEN 325 MG PO TABS
650.0000 mg | ORAL_TABLET | Freq: Four times a day (QID) | ORAL | Status: DC | PRN
  Filled 2020-02-21: qty 2

## 2020-02-21 MED ORDER — FAMOTIDINE 20 MG PO TABS: 40.0000 mg | ORAL_TABLET | Freq: Once | ORAL | Status: DC | PRN

## 2020-02-21 MED ORDER — HEPARIN (PORCINE) 25000 UT/250ML-% IV SOLN
1300.0000 [IU]/h | INTRAVENOUS | Status: DC
  Administered 2020-02-21: 1300 [IU]/h via INTRAVENOUS
  Filled 2020-02-21: qty 250

## 2020-02-21 MED ORDER — FENTANYL CITRATE (PF) 100 MCG/2ML IJ SOLN
INTRAMUSCULAR | Status: AC
  Filled 2020-02-21: qty 2

## 2020-02-21 MED ORDER — MIDAZOLAM HCL 2 MG/2ML IJ SOLN
INTRAMUSCULAR | Status: AC
  Filled 2020-02-21: qty 2

## 2020-02-21 MED ORDER — AMLODIPINE BESY-BENAZEPRIL HCL 10-20 MG PO CAPS: 1.0000 | ORAL_CAPSULE | Freq: Every day | ORAL | Status: DC

## 2020-02-21 MED ORDER — METHYLPREDNISOLONE SODIUM SUCC 125 MG IJ SOLR: 125.0000 mg | Freq: Once | INTRAMUSCULAR | Status: DC | PRN

## 2020-02-21 MED ORDER — CEFAZOLIN SODIUM-DEXTROSE 2-4 GM/100ML-% IV SOLN
INTRAVENOUS | Status: AC
  Administered 2020-02-21: 2 g via INTRAVENOUS
  Filled 2020-02-21: qty 100

## 2020-02-21 MED ORDER — MORPHINE SULFATE (PF) 4 MG/ML IV SOLN
2.0000 mg | INTRAVENOUS | Status: DC | PRN
  Administered 2020-02-22 (×2): 2 mg via INTRAVENOUS
  Filled 2020-02-21 (×2): qty 1

## 2020-02-21 MED ORDER — IODIXANOL 320 MG/ML IV SOLN
INTRAVENOUS | Status: DC | PRN
  Administered 2020-02-21: 110 mL via INTRA_ARTERIAL

## 2020-02-21 MED ORDER — ONDANSETRON HCL 4 MG/2ML IJ SOLN: 4.0000 mg | Freq: Four times a day (QID) | INTRAMUSCULAR | Status: DC | PRN

## 2020-02-21 MED ORDER — AMLODIPINE BESYLATE 10 MG PO TABS
10.0000 mg | ORAL_TABLET | Freq: Every day | ORAL | Status: DC
  Filled 2020-02-21: qty 1

## 2020-02-21 MED ORDER — SODIUM CHLORIDE 0.9 % IV SOLN: INTRAVENOUS | Status: DC

## 2020-02-21 MED ORDER — ACETAMINOPHEN 650 MG RE SUPP
650.0000 mg | Freq: Four times a day (QID) | RECTAL | Status: DC | PRN
  Filled 2020-02-21: qty 1

## 2020-02-21 MED ORDER — DIPHENHYDRAMINE HCL 50 MG/ML IJ SOLN: 50.0000 mg | Freq: Once | INTRAMUSCULAR | Status: DC | PRN

## 2020-02-21 MED ORDER — ATENOLOL 25 MG PO TABS
50.0000 mg | ORAL_TABLET | Freq: Every day | ORAL | Status: DC
  Filled 2020-02-21: qty 2
  Filled 2020-02-21 (×2): qty 1

## 2020-02-21 MED ORDER — SODIUM CHLORIDE 0.9 % IV SOLN
INTRAVENOUS | Status: DC
  Administered 2020-02-23: 75 mL/h via INTRAVENOUS

## 2020-02-21 MED ORDER — DIPHENHYDRAMINE HCL 50 MG/ML IJ SOLN
INTRAMUSCULAR | Status: AC
  Filled 2020-02-21: qty 1

## 2020-02-21 MED ORDER — FENTANYL CITRATE (PF) 100 MCG/2ML IJ SOLN
INTRAMUSCULAR | Status: DC | PRN
  Administered 2020-02-21: 25 ug via INTRAVENOUS
  Administered 2020-02-21 (×2): 50 ug via INTRAVENOUS
  Administered 2020-02-21: 25 ug via INTRAVENOUS
  Administered 2020-02-21: 50 ug via INTRAVENOUS

## 2020-02-21 MED ORDER — CEFAZOLIN SODIUM-DEXTROSE 2-4 GM/100ML-% IV SOLN: 2.0000 g | Freq: Once | INTRAVENOUS | Status: AC

## 2020-02-21 MED ORDER — MIDAZOLAM HCL 2 MG/ML PO SYRP: 8.0000 mg | ORAL_SOLUTION | Freq: Once | ORAL | Status: DC | PRN

## 2020-02-21 MED ORDER — MIDAZOLAM HCL 2 MG/2ML IJ SOLN
INTRAMUSCULAR | Status: DC | PRN
  Administered 2020-02-21: 1 mg via INTRAVENOUS
  Administered 2020-02-21: 2 mg via INTRAVENOUS
  Administered 2020-02-21: 1 mg via INTRAVENOUS
  Administered 2020-02-21: 2 mg via INTRAVENOUS

## 2020-02-21 MED ORDER — HEPARIN SODIUM (PORCINE) 1000 UNIT/ML IJ SOLN
INTRAMUSCULAR | Status: AC
  Filled 2020-02-21: qty 1

## 2020-02-21 SURGICAL SUPPLY — 33 items
BALLN DORADO 7X40X80 (BALLOONS) ×3
BALLN LUTONIX DCB 6X80X130 (BALLOONS) ×6
BALLN LUTONIX DCB 7X60X130 (BALLOONS) ×3
BALLN ULTRV 018 7X40X75 (BALLOONS) ×3
BALLOON DORADO 7X40X80 (BALLOONS) ×1 IMPLANT
BALLOON LUTONIX DCB 6X80X130 (BALLOONS) ×2 IMPLANT
BALLOON LUTONIX DCB 7X60X130 (BALLOONS) ×1 IMPLANT
BALLOON ULTRV 018 7X40X75 (BALLOONS) ×1 IMPLANT
CATH ANGIO 5F PIGTAIL 65CM (CATHETERS) ×3 IMPLANT
CATH BEACON 5 .035 65 KMP TIP (CATHETERS) ×3 IMPLANT
DEVICE CLOSURE MYNXGRIP 6/7F (Vascular Products) ×3 IMPLANT
DEVICE PRESTO INFLATION (MISCELLANEOUS) ×6 IMPLANT
DEVICE STARCLOSE SE CLOSURE (Vascular Products) ×3 IMPLANT
GLIDEWIRE ADV .035X180CM (WIRE) ×3 IMPLANT
INTRODUCER 7FR 23CM (INTRODUCER) ×6 IMPLANT
NEEDLE ENTRY 21GA 7CM ECHOTIP (NEEDLE) ×3 IMPLANT
PACK ANGIOGRAPHY (CUSTOM PROCEDURE TRAY) ×3 IMPLANT
SET INTRO CAPELLA COAXIAL (SET/KITS/TRAYS/PACK) ×3 IMPLANT
SHEATH BRITE TIP 5FRX11 (SHEATH) ×3 IMPLANT
SHEATH BRITE TIP 7FRX11 (SHEATH) ×3 IMPLANT
STENT LIFESTAR 8X80X80 (Permanent Stent) ×3 IMPLANT
STENT LIFESTREAM 7X58X80 (Permanent Stent) ×3 IMPLANT
STENT LIFESTREAM 8X58X80 (Permanent Stent) ×6 IMPLANT
STENT VIABAHN 7X25X120 (Permanent Stent) ×3 IMPLANT
STENT VIABAHN 7X50X120 (Permanent Stent) ×3 IMPLANT
STENT VIABAHN 7X5X120 7FR (Permanent Stent) ×1 IMPLANT
STENT VIABAHN2.5X120X7X (Permanent Stent) ×1 IMPLANT
SYR MEDRAD MARK 7 150ML (SYRINGE) ×3 IMPLANT
TUBING CONTRAST HIGH PRESS 72 (TUBING) ×3 IMPLANT
WIRE G 018X200 V18 (WIRE) ×6 IMPLANT
WIRE G V18X300CM (WIRE) ×3 IMPLANT
WIRE J 3MM .035X145CM (WIRE) ×6 IMPLANT
WIRE MAGIC TOR.035 180C (WIRE) ×3 IMPLANT

## 2020-02-21 NOTE — Progress Notes (Signed)
Subjective:    Patient ID: Jonathan [name], male    DOB: 1961/03/26, 59 y.o.   MRN: [id number] Chief Complaint  Patient presents with  . New Patient (Initial Visit)    PAD    The patient presents today as an urgent referral from Dr. Zada Finders.  The patient has a history of peripheral arterial disease and had a history of a stent placed in his left external iliac artery in 2017.  The patient did not have this done at our facility.  However, the patient has not been following up with his vascular surgeon.  The patient has also continued to smoke since that time.  The patient also stopped taking his Plavix as well.  The patient notes that he has had severe pain in his left leg for approximately 1 month but a week ago his foot became numb.  He notes that the pain is severe and constant.  He also notes color changes with elevation of his extremities.  He denies any fever, chills, nausea, vomiting or diarrhea.  Today noninvasive studies show a right ABI of 1.07 with a left ABI of 0.29 with a TBI of 0.12.  Duplex imaging showed dampened monophasic flow throughout.  The anterior tibial and peroneal arteries are patent however the posterior tibial artery distally is occluded.  The mid external iliac artery shows significant stenosis with no flow seen in the proximal external iliac artery which is suggestive of occlusion.   Review of Systems  Cardiovascular:       Rest pain and severe claudication  Musculoskeletal: Positive for gait problem.  Neurological: Positive for numbness.       Objective:   Physical Exam Vitals reviewed.  HENT:     Head: Normocephalic.  Cardiovascular:     Rate and Rhythm: Normal rate.     Pulses:          Dorsalis pedis pulses are 0 on the left side.       Posterior tibial pulses are 0 on the left side.  Pulmonary:     Effort: Pulmonary effort is normal.  Skin:    General: Skin is cool.     Capillary Refill: Capillary refill takes more than 3 seconds.      Coloration: Skin is cyanotic and pale.  Neurological:     Mental Status: He is alert and oriented to person, place, and time.  Psychiatric:        Mood and Affect: Mood normal.        Behavior: Behavior normal.        Thought Content: Thought content normal.        Judgment: Judgment normal.     BP (!) 212/97 (BP Location: Right Arm)   Pulse 87   Resp 16   Ht 5\' 7"  (1.702 m)   Wt 175 lb (79.4 kg)   BMI 27.41 kg/m   Past Medical History:  Diagnosis Date  . Aphasia S/P CVA   . DDD (degenerative disc disease), cervical   . ED (erectile dysfunction)   . Hyperlipidemia   . Hypertension   . Peripheral arterial disease (HCC)   . Stroke [facility])     Social History   Socioeconomic History  . Marital status: Married    Spouse name: Not on file  . Number of children: Not on file  . Years of education: Not on file  . Highest education level: Not on file  Occupational History  . Not on file  Tobacco Use  .  Smoking status: Current Every Day Smoker    Types: Cigarettes  . Smokeless tobacco: Never Used  Substance and Sexual Activity  . Alcohol use: Yes  . Drug use: Never  . Sexual activity: Not on file  Other Topics Concern  . Not on file  Social History Narrative  . Not on file   Social Determinants of Health   Financial Resource Strain:   . Difficulty of Paying Living Expenses:   Food Insecurity:   . Worried About Running Out of Food in the Last Year:   . Ran Out of Food in the Last Year:   Transportation Needs:   . Lack of Transportation (Medical):   . Lack of Transportation (Non-Medical):   Physical Activity:   . Days of Exercise per Week:   . Minutes of Exercise per Session:   Stress:   . Feeling of Stress :   Social Connections:   . Frequency of Communication with Friends and Family:   . Frequency of Social Gatherings with Friends and Family:   . Attends Religious Services:   . Active Member of Clubs or Organizations:   . Attends Club or Organization  Meetings:   . Marital Status:   Intimate Partner Violence:   . Fear of Current or Ex-Partner:   . Emotionally Abused:   . Physically Abused:   . Sexually Abused:     Past Surgical History:  Procedure Laterality Date  . PERIPHERAL VASCULAR CATHETERIZATION N/A 07/21/2015   Procedure: Abdominal Aortogram w/Lower Extremity;  Surgeon: Gregory G [name], MD;  Location: [facility] INVASIVE CV LAB;  Service: Cardiovascular;  Laterality: N/A;  . PERIPHERAL VASCULAR CATHETERIZATION  07/21/2015   Procedure: Lower Extremity Intervention;  Surgeon: Gregory G [name], MD;  Location: [facility] INVASIVE CV LAB;  Service: Cardiovascular;;    Family History  Problem Relation Age of Onset  . Emphysema Mother   . Heart attack Father   . Heart attack Paternal Grandmother     No Known Allergies     Assessment & Plan:   1. Ischemic leg Recommend:  The patient has evidence of an ischemic left lower extremity with rest pain that is associated with preulcerative changes and impending tissue loss of the foot.  Patient also has severe numbness within the last week.  This represents a limb threatening ischemia and places the patient at the risk for limb loss.  Patient should undergo angiography of the lower extremities with the hope for intervention for limb salvage.  The risks and benefits as well as the alternative therapies was discussed in detail with the patient.  All questions were answered.  Patient agrees to proceed with angiography.  Due to the significant ischemia the patient is currently having, he was sent directly to the angiogram suite from our office today.      2. Hyperlipidemia, unspecified hyperlipidemia type Continue statin as ordered and reviewed, no changes at this time   3. Tobacco use Smoking cessation was discussed, 3-10 minutes spent on this topic specifically   4. Hypertension, essential Blood pressure is excessively elevated today.  This may be exacerbated by his ischemic leg.   However this will be addressed in the hospital.   Current Outpatient Medications on File Prior to Visit  Medication Sig Dispense Refill  . albuterol (VENTOLIN HFA) 108 (90 Base) MCG/ACT inhaler Inhale into the lungs.    . amLODipine (NORVASC) 5 MG tablet Take 5 mg by mouth every morning.    . aspirin 81 MG chewable tablet Chew by   mouth.    . atenolol (TENORMIN) 50 MG tablet Take 50 mg by mouth daily.    [name] atorvastatin (LIPITOR) 40 MG tablet Take 40 mg by mouth daily.    . clopidogrel (PLAVIX) 75 MG tablet TAKE 1 TABLET BY MOUTH EVERY DAY 90 tablet 3  . lisinopril (ZESTRIL) 20 MG tablet Take by mouth.    [name] amLODipine-benazepril (LOTREL) 10-20 MG capsule Take 1 capsule by mouth daily. (Patient not taking: Reported on 02/21/2020)    . aspirin EC 325 MG tablet Take 1 tablet (325 mg total) by mouth daily. (Patient not taking: Reported on 02/21/2020) 30 tablet 0   No current facility-administered medications on file prior to visit.    There are no Patient Instructions on file for this visit. No follow-ups on file.   Georgiana Spinner, NP

## 2020-02-21 NOTE — Interval H&P Note (Signed)
History and Physical Interval Note:  02/21/2020 4:22 PM  Jonathan [name]  has presented today for surgery, with the diagnosis of LT leg angio      BARD    Ischemic leg  Covid  Aug 6.  The various methods of treatment have been discussed with the patient and family. After consideration of risks, benefits and other options for treatment, the patient has consented to  Procedure(s): LOWER EXTREMITY ANGIOGRAPHY (Left) as a surgical intervention.  The patient's history has been reviewed, patient examined, no change in status, stable for surgery.  I have reviewed the patient's chart and labs.  Questions were answered to the patient's satisfaction.     Levora Dredge

## 2020-02-21 NOTE — Progress Notes (Signed)
Spoke to Dr. Myra Gianotti by phone; received orders for carotid US, IVF, and nicotine patch. Will CTM closely.

## 2020-02-21 NOTE — H&P (View-Only) (Signed)
Subjective:    Patient ID: Jonathan [name], male    DOB: 1961/03/26, 59 y.o.   MRN: [id number] Chief Complaint  Patient presents with  . New Patient (Initial Visit)    PAD    The patient presents today as an urgent referral from Dr. Zada Finders.  The patient has a history of peripheral arterial disease and had a history of a stent placed in his left external iliac artery in 2017.  The patient did not have this done at our facility.  However, the patient has not been following up with his vascular surgeon.  The patient has also continued to smoke since that time.  The patient also stopped taking his Plavix as well.  The patient notes that he has had severe pain in his left leg for approximately 1 month but a week ago his foot became numb.  He notes that the pain is severe and constant.  He also notes color changes with elevation of his extremities.  He denies any fever, chills, nausea, vomiting or diarrhea.  Today noninvasive studies show a right ABI of 1.07 with a left ABI of 0.29 with a TBI of 0.12.  Duplex imaging showed dampened monophasic flow throughout.  The anterior tibial and peroneal arteries are patent however the posterior tibial artery distally is occluded.  The mid external iliac artery shows significant stenosis with no flow seen in the proximal external iliac artery which is suggestive of occlusion.   Review of Systems  Cardiovascular:       Rest pain and severe claudication  Musculoskeletal: Positive for gait problem.  Neurological: Positive for numbness.       Objective:   Physical Exam Vitals reviewed.  HENT:     Head: Normocephalic.  Cardiovascular:     Rate and Rhythm: Normal rate.     Pulses:          Dorsalis pedis pulses are 0 on the left side.       Posterior tibial pulses are 0 on the left side.  Pulmonary:     Effort: Pulmonary effort is normal.  Skin:    General: Skin is cool.     Capillary Refill: Capillary refill takes more than 3 seconds.      Coloration: Skin is cyanotic and pale.  Neurological:     Mental Status: He is alert and oriented to person, place, and time.  Psychiatric:        Mood and Affect: Mood normal.        Behavior: Behavior normal.        Thought Content: Thought content normal.        Judgment: Judgment normal.     BP (!) 212/97 (BP Location: Right Arm)   Pulse 87   Resp 16   Ht 5\' 7"  (1.702 m)   Wt 175 lb (79.4 kg)   BMI 27.41 kg/m   Past Medical History:  Diagnosis Date  . Aphasia S/P CVA   . DDD (degenerative disc disease), cervical   . ED (erectile dysfunction)   . Hyperlipidemia   . Hypertension   . Peripheral arterial disease (HCC)   . Stroke [facility])     Social History   Socioeconomic History  . Marital status: Married    Spouse name: Not on file  . Number of children: Not on file  . Years of education: Not on file  . Highest education level: Not on file  Occupational History  . Not on file  Tobacco Use  .  Smoking status: Current Every Day Smoker    Types: Cigarettes  . Smokeless tobacco: Never Used  Substance and Sexual Activity  . Alcohol use: Yes  . Drug use: Never  . Sexual activity: Not on file  Other Topics Concern  . Not on file  Social History Narrative  . Not on file   Social Determinants of Health   Financial Resource Strain:   . Difficulty of Paying Living Expenses:   Food Insecurity:   . Worried About Programme researcher, broadcasting/film/video in the Last Year:   . Barista in the Last Year:   Transportation Needs:   . Freight forwarder (Medical):   [name] Lack of Transportation (Non-Medical):   Physical Activity:   . Days of Exercise per Week:   . Minutes of Exercise per Session:   Stress:   . Feeling of Stress :   Social Connections:   . Frequency of Communication with Friends and Family:   . Frequency of Social Gatherings with Friends and Family:   . Attends Religious Services:   . Active Member of Clubs or Organizations:   . Attends Banker  Meetings:   [name] Marital Status:   Intimate Partner Violence:   . Fear of Current or Ex-Partner:   . Emotionally Abused:   [name] Physically Abused:   . Sexually Abused:     Past Surgical History:  Procedure Laterality Date  . PERIPHERAL VASCULAR CATHETERIZATION N/A 07/21/2015   Procedure: Abdominal Aortogram w/Lower Extremity;  Surgeon: Renford Dills, MD;  Location: [facility] INVASIVE CV LAB;  Service: Cardiovascular;  Laterality: N/A;  . PERIPHERAL VASCULAR CATHETERIZATION  07/21/2015   Procedure: Lower Extremity Intervention;  Surgeon: Renford Dills, MD;  Location: [facility] INVASIVE CV LAB;  Service: Cardiovascular;;    Family History  Problem Relation Age of Onset  . Emphysema Mother   . Heart attack Father   . Heart attack Paternal Grandmother     No Known Allergies     Assessment & Plan:   1. Ischemic leg Recommend:  The patient has evidence of an ischemic left lower extremity with rest pain that is associated with preulcerative changes and impending tissue loss of the foot.  Patient also has severe numbness within the last week.  This represents a limb threatening ischemia and places the patient at the risk for limb loss.  Patient should undergo angiography of the lower extremities with the hope for intervention for limb salvage.  The risks and benefits as well as the alternative therapies was discussed in detail with the patient.  All questions were answered.  Patient agrees to proceed with angiography.  Due to the significant ischemia the patient is currently having, he was sent directly to the angiogram suite from our office today.      2. Hyperlipidemia, unspecified hyperlipidemia type Continue statin as ordered and reviewed, no changes at this time   3. Tobacco use Smoking cessation was discussed, 3-10 minutes spent on this topic specifically   4. Hypertension, essential Blood pressure is excessively elevated today.  This may be exacerbated by his ischemic leg.   However this will be addressed in the hospital.   Current Outpatient Medications on File Prior to Visit  Medication Sig Dispense Refill  . albuterol (VENTOLIN HFA) 108 (90 Base) MCG/ACT inhaler Inhale into the lungs.    [name] amLODipine (NORVASC) 5 MG tablet Take 5 mg by mouth every morning.    [name] aspirin 81 MG chewable tablet Chew by  mouth.    . atenolol (TENORMIN) 50 MG tablet Take 50 mg by mouth daily.    [name] atorvastatin (LIPITOR) 40 MG tablet Take 40 mg by mouth daily.    . clopidogrel (PLAVIX) 75 MG tablet TAKE 1 TABLET BY MOUTH EVERY DAY 90 tablet 3  . lisinopril (ZESTRIL) 20 MG tablet Take by mouth.    [name] amLODipine-benazepril (LOTREL) 10-20 MG capsule Take 1 capsule by mouth daily. (Patient not taking: Reported on 02/21/2020)    . aspirin EC 325 MG tablet Take 1 tablet (325 mg total) by mouth daily. (Patient not taking: Reported on 02/21/2020) 30 tablet 0   No current facility-administered medications on file prior to visit.    There are no Patient Instructions on file for this visit. No follow-ups on file.   Georgiana Spinner, NP

## 2020-02-21 NOTE — Progress Notes (Signed)
Spoke to Dr. Myra Gianotti by phone; patient complains of severe left leg pain; yelling out and moaning. Left leg has increased coldness; DP pulse not palpable as previously. Leg is mottled. Heparin is infusing.

## 2020-02-21 NOTE — Progress Notes (Signed)
Patient post bilateral groin arterial sticks for bil. Kissing ileac stent placement per DR [name], patient tolerated procedure well. Bilateral PAD's intact with 67ml air instilled to protect from bleeding. No visible bleeding nor hematoma noted at this time.noting patient having pre procedure large hernia extending from right mid groin to bilateral scrotum in which patient states has never had surgery for and in fact has been present for "years". Awake/alert and oriented,post procedure. To restart heparin gtt at 2300.denies complaints of pain at this time. Will cont to monitor. Noting slight discoloration on right lower leg however DR [name] present at time, to assess with positive popliteal pulses felt.

## 2020-02-21 NOTE — Progress Notes (Addendum)
Patient was talking with this RN, and patient was noted to have slurred speech. He was unable to state his name, or DOB with garbled speech and was attempting to count. Vital signs were taken and CBG checked. All WNL. Pt was unable to follow commands, attempting to get OOB to void, and also attempting to sit up, despite this RN reminding and assisting him to lie back. Episode lasted approximately 5 minutes.   Patient is currently alert and oriented x 4, back to baseline. Neuro checks, ie pupils, hand grasps, Bilat lower extremity WNL.  Secure chat was sent to Dr. Gilda Crease, and on call Dr. Myra Gianotti, on call for vascular paged to this RN's phone.   Groin sites are unchanged. Bladder scan reveals no urine in bladder; (was in and out cathed at 7 pm by procedure nurse for 200 cc). Pt offered urinal and able to void 50 cc. Will CTM  Update at 10:09: No response from Dr. Gilda Crease re: secure chat that was sent at 8:34 pm. Also no response from Dr. Myra Gianotti re page that was sent around that same time. A second page was sent at 10:10 pm. Patient remains alert and oreinted with VSS, and groins without bleeding or other changes. States that he has history of a stroke and sometimes he does get "tongue tied" from time to time.

## 2020-02-21 NOTE — Consult Note (Signed)
ANTICOAGULATION CONSULT NOTE  Pharmacy Consult for heparin infusion Indication: lower extremity ischemia  No Known Allergies  Patient Measurements: Height: 5\' 7"  (170.2 cm) Weight: 79.4 kg (175 lb) IBW/kg (Calculated) : 66.1  Vital Signs: Temp: 98.2 F (36.8 C) (08/06 1612) Temp Source: Oral (08/06 1612) BP: 189/94 (08/06 1805) Pulse Rate: 70 (08/06 1805)  Labs: Recent Labs    02/21/20 1618  CREATININE 0.68    Estimated Creatinine Clearance: 100.4 mL/min (by C-G formula based on SCr of 0.68 mg/dL).   Medical History: Past Medical History:  Diagnosis Date  . Aphasia S/P CVA   . DDD (degenerative disc disease), cervical   . ED (erectile dysfunction)   . Hyperlipidemia   . Hypertension   . Peripheral arterial disease (HCC)   . Stroke [facility])     Assessment: 42 YOM presents with a limb threatening ischemia and places the patient at the risk for limb loss. He has a h/o PAD and had a history of a stent placed in his left external iliac artery in 2017. He is now s/p angiography of the lower extremity for limb salvage. He is on no chronic anticoagulation therapy according to medical records. He received 5000 units of heparin in the cath lab.  Goal of Therapy:  Heparin level 0.3-0.7 units/ml Monitor platelets by anticoagulation protocol: Yes   Plan:   Start heparin infusion at 1300 units/hr (avoid bolus given recent administration in cath lab)  Check anti-Xa level in 6 hours after heparin infusion initiation and daily while on heparin  Continue to monitor H&H and platelets  2018 02/21/2020,6:20 PM

## 2020-02-21 NOTE — Progress Notes (Signed)
Report given to Seward Speck on telemetry, noting no bleeding nor hematoma at bilateral sites, upon arrival, also Dr Gilda Crease in to speak with patient and reassessing patient prior to my departure.

## 2020-02-21 NOTE — Op Note (Signed)
[facility]  Percutaneous Study/Intervention Procedural Note   Date of Surgery: 02/21/2020  Surgeon:Teshia Mahone, [name]   Pre-operative Diagnosis: Atherosclerotic occlusive disease bilateral lower extremities with rest pain of the left lower extremity; acute ischemia left lower extremity  Post-operative diagnosis:  Same  Procedure(s) Performed:  1.  Abdominal aortogram  2.  Bilateral distal runoff  3.  Percutaneous transluminal angioplasty and stent placement right common iliac artery; "kissing balloon" technique  4.  Percutaneous transluminal angioplasty and stent placement left common iliac artery; "kissing balloon" technique  5.  Percutaneous transluminal angioplasty and stent placement bilateral external iliac arteries.             6.  Ultrasound guided access bilateral common femoral arteries  7.  StarClose closure device bilateral common femoral arteries  Anesthesia: Conscious sedation was administered under my direct supervision by the interventional radiology RN. IV Versed plus fentanyl were utilized. Continuous ECG, pulse oximetry and blood pressure was monitored throughout the entire procedure. Conscious sedation was for a total of 130 minutes.  Sheath: Bilateral 7 French sheath retrograde common femoral arteries  Contrast: 110 cc  Fluoroscopy Time: 11.0  Indications: Patient presented with acute critical limb ischemia.  Noninvasive studies suggested occlusion of the external iliac on the left.  Risks and benefits for intervention were reviewed potential for limb loss was also discussed.  All questions been answered patient wishes for [place] to proceed  Procedure:  Jonathan [name] a 58 y.o. male who was identified and appropriate procedural time out was performed.  The patient was then placed supine on the table and prepped and draped in the usual sterile fashion.  Ultrasound was used to evaluate the right common femoral artery.  It was echolucent and  pulsatile indicating it is patent .  An ultrasound image was acquired for the permanent record.  A micropuncture needle was used to access the right common femoral artery under direct ultrasound guidance.  The microwire was then advanced under fluoroscopic guidance without difficulty followed by the micro-sheath  A 0.035 J wire was advanced without resistance and a 5Fr sheath was placed.    The pigtail catheter was then positioned at the level of T12 and an AP image of the aorta was obtained. After review the images the pigtail catheter was repositioned above the aortic bifurcation and bilateral oblique views of the pelvis were obtained. Subsequently the detector was returned to the AP position and bilateral lower extremity runoff was obtained.  Diagnostic interpretation: The abdominal aorta is opacified with a bolus injection contrast.  Is diffusely diseased but there are no hemodynamically significant lesions noted.  The right common iliac artery demonstrates a greater than 70% stenosis in its proximal and mid portions.  The right external iliac artery demonstrates greater than 90% stenosis throughout the majority of its course.  The left common and external iliac artery are occluded.  Upon accessing the left common femoral artery with a microsheath hand-injection contrast demonstrates the left common femoral artery is patent filling both the proximal SFA as well as the profunda femoris.  On the right the common femoral artery is patent filling the proximal SFA and the profunda femoris.  After review the images the ultrasound was reprepped and delivered back onto the sterile field. The left common femoral was then imaged with the ultrasound it was noted to be echolucent and pulsatile indicating patency. Images recorded for the permanent record. Under real-time visualization a microneedle was inserted into the anterior wall the  common femoral artery microwire was then advanced without difficulty under  fluoroscopic guidance followed by placement of the micro-sheath.  It was at this point that hand-injection contrast demonstrated this anatomy and I felt that crossing the lesion was a appropriate course of action.  An advantage wire was then negotiated under fluoroscopic guidance into the aorta from the left femoral approach.  A 7 French sheath was then placed.  5000 units of heparin was given and allowed to circulate for proximally 4 minutes.  The right sheath was then upsized to a 7 [place] sheath as well after a Magic torque wire was advanced through the pigtail catheter. Magnified images of the aortic bifurcation were then made using hand injection contrast from the femoral sheaths. After appropriate sizing a 8 mm x 58 lifestream stent was selected for the right and a 8 mm x 58 lifestream stent was selected for the left. There were then advanced and positioned just above the aortic bifurcation. Insufflation for full expansion of the stents was performed simultaneously.  Focusing now on the left a 7 mm x 58 mm lifestream stent was then deployed extending into the external iliac and then adding a 7 mm x 50 mm Viabahn down to the level of the ileal inguinal ligament.  This was postdilated with a 7 mm balloon.  Follow-up imaging demonstrated an excellent result with complete reconstruction of the common and external iliac arteries down to the circumflex vessels and less than 10% residual stenosis.  Now returning to the right side a 9 x 80 life star stent was then deployed overlapping the initial lifestream followed by a 7 mm x 25 mm Viabahn.  These were then postdilated using 7 mm Lutonix drug-eluting balloons inflated to 12 atm for 1 minute.  Follow-up imaging now demonstrated reconstruction of the right common and external iliac arteries down to the common femoral with less than 10% residual stenosis.  The pigtail catheter was then introduced up the left and bolus injection of contrast was used to perform  final imaging of the distal aortic reconstruction.  This demonstrates the reconstruction in its entirety there is less than 10% residual stenosis with successful reconstruction of both the right and left iliac systems  Oblique views were then obtained of the groins in succession and Star close device is deployed without difficulty. There were no immediate complications   Findings:   Aortogram:  The abdominal aorta is opacified with a bolus injection contrast.  Is diffusely diseased but there are no hemodynamically significant lesions noted.  The right common iliac artery demonstrates a greater than 70% stenosis in its proximal and mid portions.  The right external iliac artery demonstrates greater than 90% stenosis throughout the majority of its course.  The left common and external iliac artery are occluded.  Upon accessing the left common femoral artery with a microsheath hand-injection contrast demonstrates the left common femoral artery is patent filling both the proximal SFA as well as the profunda femoris.  On the right the common femoral artery is patent filling the proximal SFA and the profunda femoris.   Following placement of the iliac stents there is now wide patency with less than 10% residual stenosis with rapid flow through the aortic bifurcation bilaterally.  Summary:  Successful reconstruction of the distal aorta and bilateral iliac arteries  Disposition: Patient was taken to the recovery room in stable condition having tolerated the procedure well.  Jonathan [name] 02/21/2020,7:07 PM

## 2020-02-22 ENCOUNTER — Observation Stay: Payer: BC Managed Care – PPO | Admitting: Anesthesiology

## 2020-02-22 ENCOUNTER — Encounter
Admission: RE | Disposition: A | Discharge status: Other Acute Inpatient Hospital | Payer: Self-pay | Source: Ambulatory Visit | Attending: Surgery

## 2020-02-22 ENCOUNTER — Observation Stay: Payer: BC Managed Care – PPO

## 2020-02-22 DIAGNOSIS — I63232 Cerebral infarction due to unspecified occlusion or stenosis of left carotid arteries: Secondary | ICD-10-CM | POA: Diagnosis not present

## 2020-02-22 DIAGNOSIS — I6389 Other cerebral infarction: Secondary | ICD-10-CM | POA: Diagnosis not present

## 2020-02-22 DIAGNOSIS — I745 Embolism and thrombosis of iliac artery: Secondary | ICD-10-CM | POA: Diagnosis present

## 2020-02-22 DIAGNOSIS — Z95820 Peripheral vascular angioplasty status with implants and grafts: Secondary | ICD-10-CM | POA: Diagnosis not present

## 2020-02-22 DIAGNOSIS — I743 Embolism and thrombosis of arteries of the lower extremities: Secondary | ICD-10-CM | POA: Diagnosis present

## 2020-02-22 DIAGNOSIS — R29722 NIHSS score 22: Secondary | ICD-10-CM | POA: Diagnosis not present

## 2020-02-22 DIAGNOSIS — I70229 Atherosclerosis of native arteries of extremities with rest pain, unspecified extremity: Secondary | ICD-10-CM | POA: Diagnosis present

## 2020-02-22 DIAGNOSIS — T82868A Thrombosis of vascular prosthetic devices, implants and grafts, initial encounter: Secondary | ICD-10-CM | POA: Diagnosis not present

## 2020-02-22 DIAGNOSIS — Z9119 Patient's noncompliance with other medical treatment and regimen: Secondary | ICD-10-CM | POA: Diagnosis not present

## 2020-02-22 DIAGNOSIS — R4701 Aphasia: Secondary | ICD-10-CM | POA: Diagnosis not present

## 2020-02-22 DIAGNOSIS — I70223 Atherosclerosis of native arteries of extremities with rest pain, bilateral legs: Secondary | ICD-10-CM | POA: Diagnosis present

## 2020-02-22 DIAGNOSIS — Z978 Presence of other specified devices: Secondary | ICD-10-CM

## 2020-02-22 DIAGNOSIS — Z8249 Family history of ischemic heart disease and other diseases of the circulatory system: Secondary | ICD-10-CM | POA: Diagnosis not present

## 2020-02-22 DIAGNOSIS — G8191 Hemiplegia, unspecified affecting right dominant side: Secondary | ICD-10-CM | POA: Diagnosis not present

## 2020-02-22 DIAGNOSIS — Z20822 Contact with and (suspected) exposure to covid-19: Secondary | ICD-10-CM | POA: Diagnosis present

## 2020-02-22 DIAGNOSIS — R0602 Shortness of breath: Secondary | ICD-10-CM | POA: Diagnosis not present

## 2020-02-22 DIAGNOSIS — I63132 Cerebral infarction due to embolism of left carotid artery: Secondary | ICD-10-CM | POA: Diagnosis not present

## 2020-02-22 DIAGNOSIS — F17210 Nicotine dependence, cigarettes, uncomplicated: Secondary | ICD-10-CM | POA: Diagnosis present

## 2020-02-22 DIAGNOSIS — E785 Hyperlipidemia, unspecified: Secondary | ICD-10-CM | POA: Diagnosis present

## 2020-02-22 DIAGNOSIS — Z825 Family history of asthma and other chronic lower respiratory diseases: Secondary | ICD-10-CM | POA: Diagnosis not present

## 2020-02-22 DIAGNOSIS — I10 Essential (primary) hypertension: Secondary | ICD-10-CM | POA: Diagnosis present

## 2020-02-22 HISTORY — PX: FEMORAL-FEMORAL BYPASS GRAFT: SHX936

## 2020-02-22 LAB — BLOOD GAS, ARTERIAL
Acid-base deficit: 7 mmol/L — ABNORMAL HIGH (ref 0.0–2.0)
Acid-base deficit: 18.3 mmol/L — ABNORMAL HIGH (ref 0.0–2.0)
Acid-base deficit: 4.6 mmol/L — ABNORMAL HIGH (ref 0.0–2.0)
Bicarbonate: 19.7 mmol/L — ABNORMAL LOW (ref 20.0–28.0)
Bicarbonate: 10.1 mmol/L — ABNORMAL LOW (ref 20.0–28.0)
Bicarbonate: 21.6 mmol/L (ref 20.0–28.0)
FIO2: 0.4
MECHVT: 500 mL
O2 Saturation: 100 %
O2 Saturation: 94.3 %
O2 Saturation: 98.8 %
PEEP: 5 cmH2O
Patient temperature: 37
Patient temperature: 37
Patient temperature: 37
RATE: 15 resp/min
pCO2 arterial: 43 mmHg (ref 32.0–48.0)
pCO2 arterial: 34 mmHg (ref 32.0–48.0)
pCO2 arterial: 44 mmHg (ref 32.0–48.0)
pH, Arterial: 7.27 — ABNORMAL LOW (ref 7.350–7.450)
pH, Arterial: 7.08 — CL (ref 7.350–7.450)
pH, Arterial: 7.3 — ABNORMAL LOW (ref 7.350–7.450)
pO2, Arterial: 493 mmHg — ABNORMAL HIGH (ref 83.0–108.0)
pO2, Arterial: 99 mmHg (ref 83.0–108.0)
pO2, Arterial: 136 mmHg — ABNORMAL HIGH (ref 83.0–108.0)

## 2020-02-22 LAB — CBC
HCT: 35.2 % — ABNORMAL LOW (ref 39.0–52.0)
HCT: 26.6 % — ABNORMAL LOW (ref 39.0–52.0)
Hemoglobin: 12 g/dL — ABNORMAL LOW (ref 13.0–17.0)
Hemoglobin: 9.3 g/dL — ABNORMAL LOW (ref 13.0–17.0)
MCH: 32.3 pg (ref 26.0–34.0)
MCH: 32.2 pg (ref 26.0–34.0)
MCHC: 34.1 g/dL (ref 30.0–36.0)
MCHC: 35 g/dL (ref 30.0–36.0)
MCV: 94.6 fL (ref 80.0–100.0)
MCV: 92 fL (ref 80.0–100.0)
Platelets: 170 10*3/uL (ref 150–400)
Platelets: 148 10*3/uL — ABNORMAL LOW (ref 150–400)
RBC: 3.72 MIL/uL — ABNORMAL LOW (ref 4.22–5.81)
RBC: 2.89 MIL/uL — ABNORMAL LOW (ref 4.22–5.81)
RDW: 12.4 % (ref 11.5–15.5)
RDW: 12.9 % (ref 11.5–15.5)
WBC: 14.2 10*3/uL — ABNORMAL HIGH (ref 4.0–10.5)
WBC: 12.4 10*3/uL — ABNORMAL HIGH (ref 4.0–10.5)
nRBC: 0 % (ref 0.0–0.2)
nRBC: 0 % (ref 0.0–0.2)

## 2020-02-22 LAB — COMPREHENSIVE METABOLIC PANEL
ALT: 32 U/L (ref 0–44)
AST: 25 U/L (ref 15–41)
Albumin: 3.2 g/dL — ABNORMAL LOW (ref 3.5–5.0)
Alkaline Phosphatase: 37 U/L — ABNORMAL LOW (ref 38–126)
Anion gap: 11 (ref 5–15)
BUN: 16 mg/dL (ref 6–20)
CO2: 20 mmol/L — ABNORMAL LOW (ref 22–32)
Calcium: 7.9 mg/dL — ABNORMAL LOW (ref 8.9–10.3)
Chloride: 107 mmol/L (ref 98–111)
Creatinine, Ser: 0.87 mg/dL (ref 0.61–1.24)
GFR calc Af Amer: >60 mL/min (ref >60)
GFR calc non Af Amer: >60 mL/min (ref >60)
Glucose, Bld: 246 mg/dL — ABNORMAL HIGH (ref 70–99)
Potassium: 3.5 mmol/L (ref 3.5–5.1)
Sodium: 138 mmol/L (ref 135–145)
Total Bilirubin: 0.9 mg/dL (ref 0.3–1.2)
Total Protein: 5.5 g/dL — ABNORMAL LOW (ref 6.5–8.1)

## 2020-02-22 LAB — ABO/RH: ABO/RH(D): B POS

## 2020-02-22 LAB — URINALYSIS, ROUTINE W REFLEX MICROSCOPIC
Bilirubin Urine: NEGATIVE
Glucose, UA: NEGATIVE mg/dL
Ketones, ur: NEGATIVE mg/dL
Nitrite: NEGATIVE
Protein, ur: 30 mg/dL — AB
Specific Gravity, Urine: 1.032 — ABNORMAL HIGH (ref 1.005–1.030)
pH: 5 (ref 5.0–8.0)

## 2020-02-22 LAB — LACTIC ACID, PLASMA: Lactic Acid, Venous: 2 mmol/L (ref 0.5–1.9)

## 2020-02-22 LAB — GLUCOSE, CAPILLARY: Glucose-Capillary: 224 mg/dL — ABNORMAL HIGH (ref 70–99)

## 2020-02-22 LAB — BASIC METABOLIC PANEL
Anion gap: 8 (ref 5–15)
BUN: 14 mg/dL (ref 6–20)
CO2: 24 mmol/L (ref 22–32)
Calcium: 7.2 mg/dL — ABNORMAL LOW (ref 8.9–10.3)
Chloride: 107 mmol/L (ref 98–111)
Creatinine, Ser: 0.86 mg/dL (ref 0.61–1.24)
GFR calc Af Amer: >60 mL/min (ref >60)
GFR calc non Af Amer: >60 mL/min (ref >60)
Glucose, Bld: 185 mg/dL — ABNORMAL HIGH (ref 70–99)
Potassium: 5 mmol/L (ref 3.5–5.1)
Sodium: 139 mmol/L (ref 135–145)

## 2020-02-22 LAB — PROTIME-INR
INR: 1.1 (ref 0.8–1.2)
Prothrombin Time: 13.5 seconds (ref 11.4–15.2)

## 2020-02-22 LAB — APTT
aPTT: 34 seconds (ref 24–36)
aPTT: 28 seconds (ref 24–36)
aPTT: 28 seconds (ref 24–36)
aPTT: 29 seconds (ref 24–36)

## 2020-02-22 LAB — FIBRINOGEN: Fibrinogen: 275 mg/dL (ref 210–475)

## 2020-02-22 LAB — HEPARIN LEVEL (UNFRACTIONATED): Heparin Unfractionated: <0.1 IU/mL — ABNORMAL LOW (ref 0.30–0.70)

## 2020-02-22 SURGERY — CREATION, BYPASS, ARTERIAL, FEMORAL TO FEMORAL, USING GRAFT
Anesthesia: General | Site: Groin | Laterality: Bilateral

## 2020-02-22 MED ORDER — EPHEDRINE SULFATE 50 MG/ML IJ SOLN
INTRAMUSCULAR | Status: DC | PRN
  Administered 2020-02-22: 15 mg via INTRAVENOUS
  Administered 2020-02-22: 10 mg via INTRAVENOUS

## 2020-02-22 MED ORDER — FIBRIN SEALANT 2 ML SINGLE DOSE KIT
PACK | CUTANEOUS | Status: DC | PRN
  Administered 2020-02-22: 2 mL via TOPICAL

## 2020-02-22 MED ORDER — ACETAMINOPHEN 650 MG RE SUPP: 325.0000 mg | RECTAL | Status: DC | PRN

## 2020-02-22 MED ORDER — ORAL CARE MOUTH RINSE
15.0000 mL | OROMUCOSAL | Status: DC
  Administered 2020-02-22 (×2): 15 mL via OROMUCOSAL

## 2020-02-22 MED ORDER — ONDANSETRON HCL 4 MG/2ML IJ SOLN: 4.0000 mg | Freq: Four times a day (QID) | INTRAMUSCULAR | Status: DC | PRN

## 2020-02-22 MED ORDER — FENTANYL CITRATE (PF) 100 MCG/2ML IJ SOLN
INTRAMUSCULAR | Status: AC
  Filled 2020-02-22: qty 2

## 2020-02-22 MED ORDER — PROPOFOL 10 MG/ML IV BOLUS
INTRAVENOUS | Status: DC | PRN
  Administered 2020-02-22: 100 mg via INTRAVENOUS

## 2020-02-22 MED ORDER — PROPOFOL 500 MG/50ML IV EMUL
INTRAVENOUS | Status: DC | PRN
Start: 2020-02-22 — End: 2020-02-22
  Administered 2020-02-22: 50 ug/kg/min via INTRAVENOUS

## 2020-02-22 MED ORDER — CHLORHEXIDINE GLUCONATE CLOTH 2 % EX PADS
6.0000 | MEDICATED_PAD | Freq: Every day | CUTANEOUS | Status: DC
  Administered 2020-02-22 – 2020-02-23 (×2): 6 via TOPICAL

## 2020-02-22 MED ORDER — PROPOFOL 1000 MG/100ML IV EMUL
INTRAVENOUS | Status: AC
  Administered 2020-02-22: 30 ug/kg/min via INTRAVENOUS
  Filled 2020-02-22: qty 100

## 2020-02-22 MED ORDER — SUCCINYLCHOLINE CHLORIDE 20 MG/ML IJ SOLN
INTRAMUSCULAR | Status: DC | PRN
  Administered 2020-02-22: 100 mg via INTRAVENOUS

## 2020-02-22 MED ORDER — INSULIN ASPART 100 UNIT/ML ~~LOC~~ SOLN
0.0000 [IU] | SUBCUTANEOUS | Status: DC
  Administered 2020-02-23: 2 [IU] via SUBCUTANEOUS
  Administered 2020-02-23 (×4): 1 [IU] via SUBCUTANEOUS
  Filled 2020-02-22 (×4): qty 1

## 2020-02-22 MED ORDER — SODIUM CHLORIDE 0.9 % IV SOLN
INTRAVENOUS | Status: DC | PRN
  Administered 2020-02-22: 25 ug/min via INTRAVENOUS

## 2020-02-22 MED ORDER — MIDAZOLAM HCL 2 MG/2ML IJ SOLN
INTRAMUSCULAR | Status: AC
  Filled 2020-02-22: qty 4

## 2020-02-22 MED ORDER — SODIUM CHLORIDE 0.9 % IV SOLN: INTRAVENOUS | Status: DC

## 2020-02-22 MED ORDER — FENTANYL CITRATE (PF) 100 MCG/2ML IJ SOLN: 100.0000 ug | Freq: Once | INTRAMUSCULAR | Status: DC

## 2020-02-22 MED ORDER — MIDAZOLAM HCL 2 MG/2ML IJ SOLN: 2.0000 mg | Freq: Once | INTRAMUSCULAR | Status: DC

## 2020-02-22 MED ORDER — PHENOL 1.4 % MT LIQD: 1.0000 | OROMUCOSAL | Status: DC | PRN

## 2020-02-22 MED ORDER — CEFAZOLIN SODIUM 1 G IJ SOLR
INTRAMUSCULAR | Status: AC
  Filled 2020-02-22: qty 10

## 2020-02-22 MED ORDER — LACTATED RINGERS IV SOLN: INTRAVENOUS | Status: DC | PRN

## 2020-02-22 MED ORDER — CHLORHEXIDINE GLUCONATE 0.12% ORAL RINSE (MEDLINE KIT)
15.0000 mL | Freq: Two times a day (BID) | OROMUCOSAL | Status: DC
  Administered 2020-02-22 – 2020-02-23 (×2): 15 mL via OROMUCOSAL

## 2020-02-22 MED ORDER — PANTOPRAZOLE SODIUM 40 MG PO TBEC
40.0000 mg | DELAYED_RELEASE_TABLET | Freq: Every day | ORAL | Status: DC
  Filled 2020-02-22: qty 1

## 2020-02-22 MED ORDER — SODIUM CHLORIDE 0.9 % IV SOLN: 500.0000 mL | Freq: Once | INTRAVENOUS | Status: DC | PRN

## 2020-02-22 MED ORDER — ARGATROBAN 50 MG/50ML IV SOLN
0.5500 ug/kg/min | INTRAVENOUS | Status: DC
  Administered 2020-02-22: 0.25 ug/kg/min via INTRAVENOUS
  Administered 2020-02-22: 0.4225 ug/kg/min via INTRAVENOUS
  Filled 2020-02-22: qty 50

## 2020-02-22 MED ORDER — MAGNESIUM SULFATE 2 GM/50ML IV SOLN: 2.0000 g | Freq: Every day | INTRAVENOUS | Status: DC | PRN

## 2020-02-22 MED ORDER — CEFAZOLIN SODIUM-DEXTROSE 2-3 GM-%(50ML) IV SOLR
INTRAVENOUS | Status: DC | PRN
  Administered 2020-02-22 (×2): 2 g via INTRAVENOUS

## 2020-02-22 MED ORDER — DEXAMETHASONE SODIUM PHOSPHATE 10 MG/ML IJ SOLN
INTRAMUSCULAR | Status: DC | PRN
  Administered 2020-02-22: 5 mg via INTRAVENOUS

## 2020-02-22 MED ORDER — LABETALOL HCL 5 MG/ML IV SOLN: 10.0000 mg | INTRAVENOUS | Status: DC | PRN

## 2020-02-22 MED ORDER — POLYETHYLENE GLYCOL 3350 17 G PO PACK
17.0000 g | PACK | Freq: Every day | ORAL | Status: DC
  Administered 2020-02-22: 17 g via ORAL
  Filled 2020-02-22 (×2): qty 1

## 2020-02-22 MED ORDER — DOCUSATE SODIUM 50 MG/5ML PO LIQD
100.0000 mg | Freq: Two times a day (BID) | ORAL | Status: DC
  Administered 2020-02-22: 100 mg via ORAL
  Filled 2020-02-22 (×3): qty 10

## 2020-02-22 MED ORDER — SUCCINYLCHOLINE CHLORIDE 200 MG/10ML IV SOSY
PREFILLED_SYRINGE | INTRAVENOUS | Status: AC
  Filled 2020-02-22: qty 10

## 2020-02-22 MED ORDER — MIDAZOLAM HCL 2 MG/2ML IJ SOLN
INTRAMUSCULAR | Status: DC | PRN
Start: 2020-02-22 — End: 2020-02-22
  Administered 2020-02-22: 2 mg via INTRAVENOUS
  Administered 2020-02-22: 4 mg via INTRAVENOUS

## 2020-02-22 MED ORDER — SODIUM CHLORIDE 0.9 % IV SOLN
INTRAVENOUS | Status: DC | PRN
  Administered 2020-02-22: 1000 mL via INTRAMUSCULAR

## 2020-02-22 MED ORDER — DOCUSATE SODIUM 100 MG PO CAPS
100.0000 mg | ORAL_CAPSULE | Freq: Every day | ORAL | Status: DC
  Filled 2020-02-22: qty 1

## 2020-02-22 MED ORDER — BUPIVACAINE LIPOSOME 1.3 % IJ SUSP
INTRAMUSCULAR | Status: AC
  Filled 2020-02-22: qty 20

## 2020-02-22 MED ORDER — LIDOCAINE HCL (CARDIAC) PF 100 MG/5ML IV SOSY
PREFILLED_SYRINGE | INTRAVENOUS | Status: DC | PRN
  Administered 2020-02-22: 50 mg via INTRAVENOUS

## 2020-02-22 MED ORDER — ARTIFICIAL TEARS OPHTHALMIC OINT
TOPICAL_OINTMENT | OPHTHALMIC | Status: AC
  Filled 2020-02-22: qty 3.5

## 2020-02-22 MED ORDER — HYDRALAZINE HCL 20 MG/ML IJ SOLN: 5.0000 mg | INTRAMUSCULAR | Status: DC | PRN

## 2020-02-22 MED ORDER — FENTANYL CITRATE (PF) 100 MCG/2ML IJ SOLN: 50.0000 ug | INTRAMUSCULAR | Status: DC | PRN

## 2020-02-22 MED ORDER — HEPARIN SODIUM (PORCINE) 5000 UNIT/ML IJ SOLN
INTRAMUSCULAR | Status: AC
  Filled 2020-02-22: qty 1

## 2020-02-22 MED ORDER — SODIUM CHLORIDE 0.9 % IV SOLN
INTRAVENOUS | Status: DC | PRN
Start: 2020-02-22 — End: 2020-02-22

## 2020-02-22 MED ORDER — IPRATROPIUM-ALBUTEROL 0.5-2.5 (3) MG/3ML IN SOLN
3.0000 mL | Freq: Four times a day (QID) | RESPIRATORY_TRACT | Status: DC
  Administered 2020-02-22 – 2020-02-23 (×4): 3 mL via RESPIRATORY_TRACT
  Filled 2020-02-22 (×3): qty 3

## 2020-02-22 MED ORDER — METOPROLOL TARTRATE 5 MG/5ML IV SOLN: 2.0000 mg | INTRAVENOUS | Status: DC | PRN

## 2020-02-22 MED ORDER — PANTOPRAZOLE SODIUM 40 MG IV SOLR
40.0000 mg | Freq: Every day | INTRAVENOUS | Status: DC
  Administered 2020-02-22 – 2020-02-23 (×2): 40 mg via INTRAVENOUS
  Filled 2020-02-22 (×2): qty 40

## 2020-02-22 MED ORDER — BUPIVACAINE HCL (PF) 0.5 % IJ SOLN
INTRAMUSCULAR | Status: AC
  Filled 2020-02-22: qty 30

## 2020-02-22 MED ORDER — ROCURONIUM BROMIDE 100 MG/10ML IV SOLN
INTRAVENOUS | Status: DC | PRN
  Administered 2020-02-22: 40 mg via INTRAVENOUS
  Administered 2020-02-22 (×3): 20 mg via INTRAVENOUS

## 2020-02-22 MED ORDER — POTASSIUM CHLORIDE CRYS ER 20 MEQ PO TBCR: 20.0000 meq | EXTENDED_RELEASE_TABLET | Freq: Every day | ORAL | Status: DC | PRN

## 2020-02-22 MED ORDER — PROPOFOL 10 MG/ML IV BOLUS
INTRAVENOUS | Status: AC
  Filled 2020-02-22: qty 20

## 2020-02-22 MED ORDER — ACETAMINOPHEN 325 MG PO TABS: 325.0000 mg | ORAL_TABLET | ORAL | Status: DC | PRN

## 2020-02-22 MED ORDER — MIDAZOLAM HCL 2 MG/2ML IJ SOLN
INTRAMUSCULAR | Status: AC
  Filled 2020-02-22: qty 2

## 2020-02-22 MED ORDER — HEPARIN SODIUM (PORCINE) 1000 UNIT/ML IJ SOLN
INTRAMUSCULAR | Status: AC
  Filled 2020-02-22: qty 1

## 2020-02-22 MED ORDER — FENTANYL CITRATE (PF) 100 MCG/2ML IJ SOLN
INTRAMUSCULAR | Status: DC | PRN
  Administered 2020-02-22: 100 ug via INTRAVENOUS
  Administered 2020-02-22 (×2): 50 ug via INTRAVENOUS
  Administered 2020-02-22 (×2): 100 ug via INTRAVENOUS

## 2020-02-22 MED ORDER — PROPOFOL 500 MG/50ML IV EMUL
INTRAVENOUS | Status: AC
  Filled 2020-02-22: qty 50

## 2020-02-22 MED ORDER — MORPHINE SULFATE (PF) 2 MG/ML IV SOLN: 2.0000 mg | Freq: Once | INTRAVENOUS | Status: AC

## 2020-02-22 MED ORDER — CEFAZOLIN SODIUM-DEXTROSE 2-4 GM/100ML-% IV SOLN
2.0000 g | Freq: Three times a day (TID) | INTRAVENOUS | Status: AC
  Administered 2020-02-22 (×2): 2 g via INTRAVENOUS
  Filled 2020-02-22 (×2): qty 100

## 2020-02-22 MED ORDER — CEFAZOLIN SODIUM 1 G IJ SOLR
INTRAMUSCULAR | Status: AC
  Filled 2020-02-22: qty 20

## 2020-02-22 MED ORDER — SODIUM BICARBONATE 8.4 % IV SOLN
INTRAVENOUS | Status: DC | PRN
  Administered 2020-02-22: 50 meq via INTRAVENOUS

## 2020-02-22 MED ORDER — LIDOCAINE HCL (PF) 2 % IJ SOLN
INTRAMUSCULAR | Status: AC
  Filled 2020-02-22: qty 5

## 2020-02-22 MED ORDER — EPHEDRINE 5 MG/ML INJ
INTRAVENOUS | Status: AC
  Filled 2020-02-22: qty 10

## 2020-02-22 MED ORDER — PROPOFOL 1000 MG/100ML IV EMUL
0.0000 ug/kg/min | INTRAVENOUS | Status: DC
  Administered 2020-02-22: 30 ug/kg/min via INTRAVENOUS
  Filled 2020-02-22 (×2): qty 100

## 2020-02-22 MED ORDER — PHENYLEPHRINE HCL (PRESSORS) 10 MG/ML IV SOLN
INTRAVENOUS | Status: DC | PRN
Start: 2020-02-22 — End: 2020-02-22
  Administered 2020-02-22 (×2): 200 ug via INTRAVENOUS
  Administered 2020-02-22: 100 ug via INTRAVENOUS
  Administered 2020-02-22: 200 ug via INTRAVENOUS
  Administered 2020-02-22 (×2): 100 ug via INTRAVENOUS

## 2020-02-22 MED ORDER — ROCURONIUM BROMIDE 10 MG/ML (PF) SYRINGE
PREFILLED_SYRINGE | INTRAVENOUS | Status: AC
  Filled 2020-02-22: qty 10

## 2020-02-22 MED ORDER — GUAIFENESIN-DM 100-10 MG/5ML PO SYRP: 15.0000 mL | ORAL_SOLUTION | ORAL | Status: DC | PRN

## 2020-02-22 MED ORDER — HEPARIN SODIUM (PORCINE) 1000 UNIT/ML IJ SOLN
INTRAMUSCULAR | Status: DC | PRN
Start: 2020-02-22 — End: 2020-02-22
  Administered 2020-02-22: 8000 [IU] via INTRAVENOUS
  Administered 2020-02-22 (×3): 3000 [IU] via INTRAVENOUS

## 2020-02-22 MED ORDER — ALUM & MAG HYDROXIDE-SIMETH 200-200-20 MG/5ML PO SUSP: 15.0000 mL | ORAL | Status: DC | PRN

## 2020-02-22 SURGICAL SUPPLY — 80 items
ADH SKN CLS APL DERMABOND .7 (GAUZE/BANDAGES/DRESSINGS)
BAG COUNTER SPONGE EZ (MISCELLANEOUS) IMPLANT
BAG DECANTER FOR FLEXI CONT (MISCELLANEOUS) ×3 IMPLANT
BAG SPNG 4X4 CLR HAZ (MISCELLANEOUS)
BLADE SURG 15 STRL LF DISP TIS (BLADE) ×1 IMPLANT
BLADE SURG 15 STRL SS (BLADE) ×3
BLADE SURG SZ11 CARB STEEL (BLADE) ×3 IMPLANT
BOOT SUTURE AID YELLOW STND (SUTURE) ×3 IMPLANT
BRUSH SCRUB EZ  4% CHG (MISCELLANEOUS)
BRUSH SCRUB EZ 4% CHG (MISCELLANEOUS) IMPLANT
CANISTER SUCT 1200ML W/VALVE (MISCELLANEOUS) ×3 IMPLANT
CATH EMBOLECTOMY 3X80 (CATHETERS) ×3 IMPLANT
CATH EMBOLECTOMY 4X80 (CATHETERS) ×6 IMPLANT
COUNTER SPONGE BAG EZ (MISCELLANEOUS)
COVER WAND RF STERILE (DRAPES) ×3 IMPLANT
DERMABOND ADVANCED (GAUZE/BANDAGES/DRESSINGS)
DERMABOND ADVANCED .7 DNX12 (GAUZE/BANDAGES/DRESSINGS) IMPLANT
DRAPE 3/4 80X56 (DRAPES) ×3 IMPLANT
DRAPE INCISE IOBAN 66X45 STRL (DRAPES) ×6 IMPLANT
DRESSING SURGICEL FIBRLLR 1X2 (HEMOSTASIS) IMPLANT
DRSG OPSITE POSTOP 4X8 (GAUZE/BANDAGES/DRESSINGS) ×6 IMPLANT
DRSG SURGICEL FIBRILLAR 1X2 (HEMOSTASIS)
DURAPREP 26ML APPLICATOR (WOUND CARE) IMPLANT
ELECT CAUTERY BLADE 6.4 (BLADE) ×6 IMPLANT
ELECT REM PT RETURN 9FT ADLT (ELECTROSURGICAL) ×3
ELECTRODE REM PT RTRN 9FT ADLT (ELECTROSURGICAL) ×1 IMPLANT
GAUZE 4X4 16PLY RFD (DISPOSABLE) IMPLANT
GEL ULTRASOUND 20GR AQUASONIC (MISCELLANEOUS) ×3 IMPLANT
GLOVE BIO SURGEON STRL SZ7 (GLOVE) ×3 IMPLANT
GLOVE INDICATOR 7.5 STRL GRN (GLOVE) ×6 IMPLANT
GLOVE PROTEXIS LATEX SZ 7.5 (GLOVE) ×18 IMPLANT
GLOVE SURG SYN 8.0 (GLOVE) IMPLANT
GOWN STRL REUS W/ TWL LRG LVL3 (GOWN DISPOSABLE) ×1 IMPLANT
GOWN STRL REUS W/ TWL XL LVL3 (GOWN DISPOSABLE) ×1 IMPLANT
GOWN STRL REUS W/TWL LRG LVL3 (GOWN DISPOSABLE) ×3
GOWN STRL REUS W/TWL XL LVL3 (GOWN DISPOSABLE) ×3
HEMOSTAT SURGICEL 2X14 (HEMOSTASIS) ×6 IMPLANT
IV NS 500ML (IV SOLUTION) ×3
IV NS 500ML BAXH (IV SOLUTION) ×1 IMPLANT
KIT PREVENA INCISION MGT 13 (CANNISTER) ×6 IMPLANT
KIT TURNOVER KIT A (KITS) ×3 IMPLANT
LABEL OR SOLS (LABEL) ×3 IMPLANT
LOOP RED MAXI  1X406MM (MISCELLANEOUS) ×6
LOOP VESSEL MAXI 1X406 RED (MISCELLANEOUS) ×3 IMPLANT
LOOP VESSEL MINI 0.8X406 BLUE (MISCELLANEOUS) ×2 IMPLANT
LOOPS BLUE MINI 0.8X406MM (MISCELLANEOUS) ×4
NEEDLE FILTER BLUNT 18X 1/2SAF (NEEDLE) ×2
NEEDLE FILTER BLUNT 18X1 1/2 (NEEDLE) ×1 IMPLANT
NS IRRIG 500ML POUR BTL (IV SOLUTION) ×3 IMPLANT
PACK BASIN MAJOR ARMC (MISCELLANEOUS) ×3 IMPLANT
PACK UNIVERSAL (MISCELLANEOUS) ×3 IMPLANT
PATCH CAROTID ECM VASC 1X10 (Prosthesis & Implant Heart) ×3 IMPLANT
PENCIL ELECTRO HAND CTR (MISCELLANEOUS) IMPLANT
SPONGE LAP 18X18 RF (DISPOSABLE) ×3 IMPLANT
STAPLER SKIN PROX 35W (STAPLE) ×3 IMPLANT
STOCKINETTE STRL 6IN 960660 (GAUZE/BANDAGES/DRESSINGS) ×6 IMPLANT
SUT GTX CV-5 TTC13 DBL (SUTURE) IMPLANT
SUT MNCRL+ 5-0 UNDYED PC-3 (SUTURE) ×1 IMPLANT
SUT MONOCRYL 5-0 (SUTURE) ×2
SUT PROLENE 5 0 RB 1 DA (SUTURE) ×15 IMPLANT
SUT PROLENE 6 0 BV (SUTURE) ×6 IMPLANT
SUT PROLENE 6 0 CC (SUTURE) ×6 IMPLANT
SUT SILK 2 0 (SUTURE) ×3
SUT SILK 2 0 SH (SUTURE) IMPLANT
SUT SILK 2-0 18XBRD TIE 12 (SUTURE) ×1 IMPLANT
SUT SILK 3 0 (SUTURE) ×3
SUT SILK 3-0 18XBRD TIE 12 (SUTURE) ×1 IMPLANT
SUT SILK 4 0 (SUTURE) ×3
SUT SILK 4-0 18XBRD TIE 12 (SUTURE) ×1 IMPLANT
SUT VIC AB 2-0 CT1 (SUTURE) ×6 IMPLANT
SUT VIC AB 3-0 SH 27 (SUTURE) ×9
SUT VIC AB 3-0 SH 27X BRD (SUTURE) ×3 IMPLANT
SUT VICRYL+ 3-0 36IN CT-1 (SUTURE) IMPLANT
SYR 20ML LL LF (SYRINGE) ×3 IMPLANT
SYR 3ML LL SCALE MARK (SYRINGE) ×3 IMPLANT
SYR BULB IRRIG 60ML STRL (SYRINGE) IMPLANT
TAPE UMBIL 1/8X18 RADIOPA (MISCELLANEOUS) IMPLANT
TOWEL OR 17X26 4PK STRL BLUE (TOWEL DISPOSABLE) ×12 IMPLANT
TRAY FOLEY MTR SLVR 16FR STAT (SET/KITS/TRAYS/PACK) ×3 IMPLANT
lemaitre 4f over-the-wire embolectomy cath IMPLANT

## 2020-02-22 NOTE — Consult Note (Signed)
ANTICOAGULATION CONSULT NOTE   Pharmacy Consult for Argatroban dosing and monitoring  Indication: Possible HIT  No Known Allergies  Patient Measurements: Height: 5\' 7"  (170.2 cm) Weight: 79.6 kg (175 lb 8 oz) IBW/kg (Calculated) : 66.1   Vital Signs: Temp: 94.3 F (34.6 C) (08/07 0830) BP: 101/70 (08/07 0830) Pulse Rate: 74 (08/07 0830)  Labs: Recent Labs    02/21/20 1618 02/22/20 0210 02/22/20 1137  HGB  --  12.0* 9.3*  HCT  --  35.2* 26.6*  PLT  --  170 148*  APTT  --  34  --   LABPROT  --  13.5  --   INR  --  1.1  --   HEPARINUNFRC  --  <0.10*  --   CREATININE 0.68 0.87  --     Estimated Creatinine Clearance: 92.5 mL/min (by C-G formula based on SCr of 0.87 mg/dL).   Medical History: Past Medical History:  Diagnosis Date  . Aphasia S/P CVA   . DDD (degenerative disc disease), cervical   . ED (erectile dysfunction)   . Hyperlipidemia   . Hypertension   . Peripheral arterial disease (HCC)   . Stroke [facility])     Assessment: Pharmacy consulted for argatroban dosing and monitoring for possible HIT. Patient admitted 8/6 with ischemic left lower extremity and initiated on heparin infusion. Patient under went recanalization of occluded left iliac w/stenting of bilateral iliac arteries 8/6. On 8/7 AM patient was complaining of significant leg pain with loss of sensation. He was taken to operating room for emergent revascularization where multiple clots were found.   Goal of Therapy:  aPTT 50-90 seconds Monitor platelets by anticoagulation protocol: Yes   Plan:  8/7 After discussion with Dr. 10/7, argatroban will be initiated at half rate due to high risk of bleeds from surgical procedure this morning.   Will start argatroban @ 0.98mcg/kg/min (19.9 mcg/min). Will order aPTT level every 2 hours after infusion is started until aPTT within goal range.   Pharmacy will adjust infusion rate based on aPTT levels.    32m, PharmD, BCPS Clinical  Pharmacist 02/22/2020 12:20 PM

## 2020-02-22 NOTE — Progress Notes (Signed)
Dr. Myra Gianotti in to see patient. Patient had received morphine 2 mg IVP without relief. Per Dr. Myra Gianotti, patient given second dose of Morphine 2 mg IV.

## 2020-02-22 NOTE — Transfer of Care (Signed)
Immediate Anesthesia Transfer of Care Note  Patient: Jonathan [name]  Procedure(s) Performed: bilateral ileofemoral thrombectomy, left popliteal exposure and thrombectomy, four compartment fasciotomy, left femoral endarterectomy with patch angioplasty (Bilateral Groin)  Patient Location: ICU  Anesthesia Type:General  Level of Consciousness: Patient remains intubated per anesthesia plan  Airway & Oxygen Therapy: Patient remains intubated per anesthesia plan  Post-op Assessment: Report given to RN and Post -op Vital signs reviewed and stable  Post vital signs: Reviewed  Last Vitals:  Vitals Value Taken Time  BP 123/55 02/22/20 0622  Temp    Pulse 89 02/22/20 0622  Resp 15 02/22/20 0623  SpO2 100 % 02/22/20 0622  Vitals shown include unvalidated device data.  Last Pain:  Vitals:   02/22/20 0022  TempSrc:   PainSc: 10-Worst pain ever         Complications: No complications documented.

## 2020-02-22 NOTE — Consult Note (Signed)
PULMONARY / CRITICAL CARE MEDICINE  Name: Jonathan [name] MRN: [id number] DOB: 03-28-1961    LOS: 0  Referring Provider:  Dr Myra Gianotti Reason for Referral:  Ventilator support  Brief patient description:   59 Y/O MALE presenting with leg ischemia. Now s/p angiography with PCI and stent placement   HPI:  This is a 59 y/o male who presented with a medical history as indicated below who was sent from [facility] for an urgent due to leg pain with severe claudication at rest.  He was diagnosed with Atherosclerotic occlusive disease bilateral lower extremities with rest pain of the left lower extremity; acute ischemia left lower extremity.  On 02/21/2020, he underwent:  Procedure(s) Performed:             1.  Abdominal aortogram             2.  Bilateral distal runoff             3.  Percutaneous transluminal angioplasty and stent placement right common iliac artery; "kissing balloon" technique             4.  Percutaneous transluminal angioplasty and stent placement left common iliac artery; "kissing balloon" technique             5.  Percutaneous transluminal angioplasty and stent placement bilateral external iliac arteries.             6.  Ultrasound guided access bilateral common femoral arteries             7.  StarClose closure device bilateral common femoral arteries  At about 11:51 PM, patient started complaining of severe left leg pain.  Upon assessment he had no palpable pulses in his left leg was mottled.  He was on a heparin drip at the time.  He was taken back to the OR by Dr. Cheree Ditto and underwent lower extremity angiography with PCI and stent placement in the left iliofemoral artery and right external iliac artery.  He has been kept intubated post procedure as he is due to return to the OR later today.  PCCM is consulted for vent management  SIGNIFICANT EVENTS: 02/21/2020: OR for revascularization with stent placement to the right common iliac artery and the  left common iliac artery 02/22/2020: Back to the OR for revascularization of the right and left iliac arteries; kept intubated and sedated     Past Medical History:  Diagnosis Date  . Aphasia S/P CVA   . DDD (degenerative disc disease), cervical   . ED (erectile dysfunction)   . Hyperlipidemia   . Hypertension   . Peripheral arterial disease (HCC)   . Stroke [facility])    Past Surgical History:  Procedure Laterality Date  . PERIPHERAL VASCULAR CATHETERIZATION N/A 07/21/2015   Procedure: Abdominal Aortogram w/Lower Extremity;  Surgeon: Renford Dills, MD;  Location: [facility] INVASIVE CV LAB;  Service: Cardiovascular;  Laterality: N/A;  . PERIPHERAL VASCULAR CATHETERIZATION  07/21/2015   Procedure: Lower Extremity Intervention;  Surgeon: Renford Dills, MD;  Location: [facility] INVASIVE CV LAB;  Service: Cardiovascular;;   No current facility-administered medications on file prior to encounter.   Current Outpatient Medications on File Prior to Encounter  Medication Sig  . albuterol (VENTOLIN HFA) 108 (90 Base) MCG/ACT inhaler Inhale into the lungs.  [name] amLODipine (NORVASC) 5 MG tablet Take 5 mg by mouth every morning.  [name] amLODipine-benazepril (LOTREL) 10-20 MG capsule Take 1 capsule by mouth daily.   [name] atenolol (  TENORMIN) 50 MG tablet Take 50 mg by mouth daily.  [name] atorvastatin (LIPITOR) 40 MG tablet Take 40 mg by mouth daily.  . clopidogrel (PLAVIX) 75 MG tablet TAKE 1 TABLET BY MOUTH EVERY DAY  . lisinopril (ZESTRIL) 20 MG tablet Take by mouth.  [name] aspirin 81 MG chewable tablet Chew by mouth.  [name] aspirin EC 325 MG tablet Take 1 tablet (325 mg total) by mouth daily. (Patient not taking: Reported on 02/21/2020)    Allergies No Known Allergies  Family History Family History  Problem Relation Age of Onset  . Emphysema Mother   . Heart attack Father   . Heart attack Paternal Grandmother    Social History  reports that he has been smoking cigarettes. He has never used smokeless tobacco. He  reports current alcohol use. He reports that he does not use drugs.  Review Of Systems: Unable to obtain as patient is intubated and sedated  VITAL SIGNS: BP (!) 123/55   Pulse 89   Temp 97.9 F (36.6 C)   Resp 12   Ht 5\' 7"  (1.702 m)   Wt 79.6 kg   SpO2 100%   BMI 27.49 kg/m   HEMODYNAMICS:    VENTILATOR SETTINGS: Vent Mode: PRVC FiO2 (%):  [40 %] 40 % Set Rate:  [15 bmp] 15 bmp Vt Set:  [500 mL] 500 mL PEEP:  [5 cmH20] 5 cmH20  INTAKE / OUTPUT: I/O last 3 completed shifts: In: 5000 [I.V.:5000] Out: 600 [Urine:500; Blood:100]  PHYSICAL EXAMINATION: General: Intubated, sedated and in no acute distress HEENT: PERRLA, trachea midline, no JVD Neuro: Withdraws to noxious stimulus, corneal reflexes intact Cardiovascular: Apical pulse regular, S1-S2, no murmur regurg or gallop Extremities: +2 pulses in bilateral upper extremities, bilateral lower extremities cool to touch, pulses only by Doppler, no bleeding noted from surgical incisions in bilateral lower extremities Lungs: Bilateral breath sounds with expiratory wheezes in right lung field, no rhonchi Abdomen: Nondistended, normal bowel sounds in all 4 quadrants Musculoskeletal: Positive range of motion, no joint deformities Skin: Warm and dry, mild cyanosis of left lower extremity  LABS:  BMET Recent Labs  Lab 02/21/20 1618 02/22/20 0210  NA  --  138  K  --  3.5  CL  --  107  CO2  --  20*  BUN 13 16  CREATININE 0.68 0.87  GLUCOSE  --  246*    Electrolytes Recent Labs  Lab 02/22/20 0210  CALCIUM 7.9*    CBC Recent Labs  Lab 02/22/20 0210  WBC 14.2*  HGB 12.0*  HCT 35.2*  PLT 170    Coag's Recent Labs  Lab 02/22/20 0210  APTT 34  INR 1.1    Sepsis Markers Recent Labs  Lab 02/22/20 0242  LATICACIDVEN 2.0*    ABG Recent Labs  Lab 02/22/20 0214 02/22/20 0417  PHART 7.27* 7.08*  PCO2ART 43 34  PO2ART 493* 99    Liver Enzymes Recent Labs  Lab 02/22/20 0210  AST 25  ALT 32   ALKPHOS 37*  BILITOT 0.9  ALBUMIN 3.2*    Cardiac Enzymes No results for input(s): TROPONINI, PROBNP in the last 168 hours.  Glucose Recent Labs  Lab 02/21/20 2021 02/22/20 0632  GLUCAP 241* 224*    Imaging PERIPHERAL VASCULAR CATHETERIZATION  Result Date: 02/21/2020 See op note   STUDIES:  Bilateral lower extremity angiography  CULTURES: None  ANTIBIOTICS: Empiric antibiotics for surgical prophylaxis  LINES/TUBES: PIV's ET tube OG tube StarClose closure device bilateral common femoral arteries  DISCUSSION: 59 year old male presenting with severe atherosclerotic occlusive disease of bilateral lower extremities now s/p PCI with stent placement.  Patient is at extremely high risk for limb loss.  Currently intubated and sedated for procedural purposes.  ASSESSMENT  Acute limb ischemia Vent dependence for procedures Atherosclerotic occlusive disease of bilateral lower extremity extremities Hypertension Hyperlipidemia Tobacco use disorder PAD History of CVA Degenerative disc disease  PLAN Full vent support with current settings VAP protocol Weaning trials once cleared by vascular surgery Nebulized bronchodilators every 6 hours ABG and chest x-ray post intubation ordered Rest of the treatment plan per vascular surgery  Best Practice: Code Status: Full code Diet: N.p.o. GI prophylaxis: Protonix VTE prophylaxis: Already on heparin infusion  FAMILY  - Updates: No family at bedside.  Please update after interdisciplinary rounds  Yalanda Soderman S. [facility] ANP-BC Pulmonary and Critical Care Medicine [facility] Pager [phone] or [phone]  NB: This document was prepared using Dragon voice recognition software and may include unintentional dictation errors.    02/22/2020, 7:07 AM

## 2020-02-22 NOTE — Progress Notes (Signed)
Pt extubated and placed on 3 1/2 Lnc per MD order

## 2020-02-22 NOTE — Progress Notes (Signed)
Report called to [facility] RN in ICU.

## 2020-02-22 NOTE — Plan of Care (Signed)
Pt was extubated today around 1600 to 3l/[place], tolerating well. VSS, wife was updated bt bedside over the phone by DR Myra Gianotti,.

## 2020-02-22 NOTE — Consult Note (Signed)
ANTICOAGULATION CONSULT NOTE   Pharmacy Consult for Argatroban dosing and monitoring  Indication: Possible HIT  No Known Allergies  Patient Measurements: Height: 5\' 7"  (170.2 cm) Weight: 79.6 kg (175 lb 8 oz) IBW/kg (Calculated) : 66.1   Vital Signs: Temp: 99.9 F (37.7 C) (08/07 1543) Temp Source: Rectal (08/07 1400) BP: 134/74 (08/07 1400) Pulse Rate: 108 (08/07 1543)  Labs: Recent Labs    02/21/20 1618 02/22/20 0210 02/22/20 1137 02/22/20 1542  HGB  --  12.0* 9.3*  --   HCT  --  35.2* 26.6*  --   PLT  --  170 148*  --   APTT  --  34  --  28  LABPROT  --  13.5  --   --   INR  --  1.1  --   --   HEPARINUNFRC  --  <0.10*  --   --   CREATININE 0.68 0.87 0.86  --     Estimated Creatinine Clearance: 93.5 mL/min (by C-G formula based on SCr of 0.86 mg/dL).   Medical History: Past Medical History:  Diagnosis Date  . Aphasia S/P CVA   . DDD (degenerative disc disease), cervical   . ED (erectile dysfunction)   . Hyperlipidemia   . Hypertension   . Peripheral arterial disease (HCC)   . Stroke [facility])     Assessment: Pharmacy consulted for argatroban dosing and monitoring for possible HIT. Patient admitted 8/6 with ischemic left lower extremity and initiated on heparin infusion. Patient under went recanalization of occluded left iliac w/stenting of bilateral iliac arteries 8/6. On 8/7 AM patient was complaining of significant leg pain with loss of sensation. He was taken to operating room for emergent revascularization where multiple clots were found.   After discussion with Dr. 10/7, argatroban was initiated at half rate due to high risk of bleeds following surgical procedure 8/7.  8/7 @ 1400 argatroban started @ 0.67mcg/kg/min.   Goal of Therapy:  aPTT 50-90 seconds Monitor platelets by anticoagulation protocol: Yes   Plan:  8/7 @ 1542 aPTT: 28. Level is subtherapeutic.   Will increase argatroban by ~30% to  0.325 mcg/kg/min (25.87 mcg/min). APTT ordered for  2 hours after infusion rate change and every 2 hours until aPTT within goal range.   Pharmacy will adjust infusion rate based on aPTT levels.    10/7, PharmD, BCPS Clinical Pharmacist 02/22/2020 4:33 PM

## 2020-02-22 NOTE — Consult Note (Addendum)
ANTICOAGULATION CONSULT NOTE   Pharmacy Consult for Argatroban dosing and monitoring  Indication: Possible HIT  No Known Allergies  Patient Measurements: Height: 5\' 7"  (170.2 cm) Weight: 79.6 kg (175 lb 8 oz) Actual BW/kg (Calculated) : 79.4 kg   Vital Signs: Temp: 99.1 F (37.3 C) (08/07 2200) Temp Source: Rectal (08/07 1400) BP: 148/78 (08/07 2200) Pulse Rate: 92 (08/07 2200)  Labs: Recent Labs    02/21/20 1618 02/22/20 0210 02/22/20 0210 02/22/20 1137 02/22/20 1542 02/22/20 1851 02/22/20 2200  HGB  --  12.0*  --  9.3*  --   --   --   HCT  --  35.2*  --  26.6*  --   --   --   PLT  --  170  --  148*  --   --   --   APTT  --  34   < >  --  28 28 29   LABPROT  --  13.5  --   --   --   --   --   INR  --  1.1  --   --   --   --   --   HEPARINUNFRC  --  <0.10*  --   --   --   --   --   CREATININE 0.68 0.87  --  0.86  --   --   --    < > = values in this interval not displayed.    Estimated Creatinine Clearance: 93.5 mL/min (by C-G formula based on SCr of 0.86 mg/dL).   Medical History: Past Medical History:  Diagnosis Date  . Aphasia S/P CVA   . DDD (degenerative disc disease), cervical   . ED (erectile dysfunction)   . Hyperlipidemia   . Hypertension   . Peripheral arterial disease (HCC)   . Stroke [facility])     Assessment: Pharmacy consulted for argatroban dosing and monitoring for possible HIT. Patient admitted 8/6 with ischemic left lower extremity and initiated on heparin infusion. Patient under went recanalization of occluded left iliac w/stenting of bilateral iliac arteries 8/6. On 8/7 AM patient was complaining of significant leg pain with loss of sensation. He was taken to operating room for emergent revascularization where multiple clots were found.   After discussion with Dr. 10/6, argatroban was initiated at half rate due to high risk of bleeds following surgical procedure 8/7.  8/7 @ 1400 argatroban started @ 0.17mcg/kg/min.  8/7 @ 1542 aPTT: 28.  Level is subtherapeutic.   Will increase argatroban by ~30% to 0.325 mcg/kg/min (25.87 mcg/min). 8/7 @ 1851 aPTT: 28. Level is subtherapeutic.   Will increase argatroban by ~30% to 0.4225 mcg/kg/min (33.63 mcg/min).   Goal of Therapy:  aPTT 50-90 seconds Monitor platelets by anticoagulation protocol: Yes   Plan:  8/7 @ 2304  aPTT: 29. Level is subtherapeutic.   Will increase argatroban by ~30% to 0.5500 mcg/kg/min (43.67 mcg/min). APTT ordered for 2 hours after infusion rate change and every 2 hours until aPTT within goal range.   Pharmacy will adjust infusion rate based on aPTT levels.    10/7 02/22/2020 11:10 PM

## 2020-02-22 NOTE — Progress Notes (Signed)
eLink Physician-Brief Progress Note Patient Name: Jonathan [name] DOB: 14-Jun-1961 MRN: [id number]   Date of Service  02/22/2020  HPI/Events of Note  Patient with post-op respiratory failure s/p lower extremity vascular surgery, he is on the ventilator.[name]  eICU Interventions  New Patient Evaluation completed.        Thomasene Lot Iyahna Obriant 02/22/2020, 7:04 AM

## 2020-02-22 NOTE — Anesthesia Procedure Notes (Signed)
Arterial Line Insertion Performed by: Karleen Hampshire, MD, anesthesiologist  Patient location: Pre-op. Preanesthetic checklist: patient identified, IV checked, site marked, risks and benefits discussed, surgical consent, monitors and equipment checked, pre-op evaluation, timeout performed and anesthesia consent Patient sedated Left, radial was placed Catheter size: 20 Fr Hand hygiene performed , maximum sterile barriers used  and Seldinger technique used  Attempts: 1 Procedure performed using ultrasound guided technique. Ultrasound Notes:anatomy identified, needle tip was noted to be adjacent to the nerve/plexus identified and no ultrasound evidence of intravascular and/or intraneural injection Following insertion, dressing applied. Post procedure assessment: normal and unchanged  Patient tolerated the procedure well with no immediate complications.

## 2020-02-22 NOTE — Consult Note (Signed)
ANTICOAGULATION CONSULT NOTE   Pharmacy Consult for Argatroban dosing and monitoring  Indication: Possible HIT  No Known Allergies  Patient Measurements: Height: 5\' 7"  (170.2 cm) Weight: 79.6 kg (175 lb 8 oz) IBW/kg (Calculated) : 66.1   Vital Signs: Temp: 99.5 F (37.5 C) (08/07 1800) Temp Source: Rectal (08/07 1400) BP: 150/74 (08/07 1800) Pulse Rate: 86 (08/07 1800)  Labs: Recent Labs    02/21/20 1618 02/22/20 0210 02/22/20 1137 02/22/20 1542 02/22/20 1851  HGB  --  12.0* 9.3*  --   --   HCT  --  35.2* 26.6*  --   --   PLT  --  170 148*  --   --   APTT  --  34  --  28 28  LABPROT  --  13.5  --   --   --   INR  --  1.1  --   --   --   HEPARINUNFRC  --  <0.10*  --   --   --   CREATININE 0.68 0.87 0.86  --   --     Estimated Creatinine Clearance: 93.5 mL/min (by C-G formula based on SCr of 0.86 mg/dL).   Medical History: Past Medical History:  Diagnosis Date   Aphasia S/P CVA    DDD (degenerative disc disease), cervical    ED (erectile dysfunction)    Hyperlipidemia    Hypertension    Peripheral arterial disease (HCC)    Stroke [facility])     Assessment: Pharmacy consulted for argatroban dosing and monitoring for possible HIT. Patient admitted 8/6 with ischemic left lower extremity and initiated on heparin infusion. Patient under went recanalization of occluded left iliac w/stenting of bilateral iliac arteries 8/6. On 8/7 AM patient was complaining of significant leg pain with loss of sensation. He was taken to operating room for emergent revascularization where multiple clots were found.   After discussion with Dr. 10/7, argatroban was initiated at half rate due to high risk of bleeds following surgical procedure 8/7.  8/7 @ 1400 argatroban started @ 0.25mcg/kg/min.  8/7 @ 1542 aPTT: 28. Level is subtherapeutic.   Will increase argatroban by ~30% to 0.325 mcg/kg/min (25.87 mcg/min).  Goal of Therapy:  aPTT 50-90 seconds Monitor platelets by  anticoagulation protocol: Yes   Plan:  8/7 @ 1851 aPTT: 28. Level is subtherapeutic.   Will increase argatroban by ~30% to 0.4225 mcg/kg/min (33.63 mcg/min). APTT ordered for 2 hours after infusion rate change and every 2 hours until aPTT within goal range.   Pharmacy will adjust infusion rate based on aPTT levels.    10/7 02/22/2020 7:25 PM

## 2020-02-22 NOTE — Anesthesia Postprocedure Evaluation (Signed)
Anesthesia Post Note  Patient: Jonathan [name]  Procedure(s) Performed: bilateral ileofemoral thrombectomy, left popliteal exposure and thrombectomy, four compartment fasciotomy, left femoral endarterectomy with patch angioplasty (Bilateral Groin)  Patient location during evaluation: SICU Anesthesia Type: General Level of consciousness: sedated Pain management: pain level controlled Vital Signs Assessment: post-procedure vital signs reviewed and stable Respiratory status: patient remains intubated per anesthesia plan Cardiovascular status: stable Postop Assessment: no apparent nausea or vomiting Anesthetic complications: no   No complications documented.   Last Vitals:  Vitals:   02/22/20 0829 02/22/20 0830  BP: 97/67 101/70  Pulse:  74  Resp:    Temp:  (!) 34.6 C  SpO2:      Last Pain:  Vitals:   02/22/20 0022  TempSrc:   PainSc: 10-Worst pain ever                 Cleda Mccreedy Heith Haigler

## 2020-02-22 NOTE — Op Note (Signed)
Patient name: Jonathan [name] MRN: [id number] DOB: February 10, 1961 Sex: male  02/22/2020 Pre-operative Diagnosis: Acute bilateral lower extremity ischemia Post-operative diagnosis:  Same Surgeon:  Durene Cal Assistants: None Procedure:   #1: Bilateral iliofemoral thromboembolectomy   #2: Left femoral endarterectomy with CorMatrix patch angioplasty   #3: Left below-knee popliteal artery exposure and tibial thrombectomy   #4: 4 compartment left leg fasciotomy   #5: Bilateral groin Prevena wound VAC Anesthesia: General Blood Loss: 500 cc Specimens: None  Findings: Acute thrombus was noted within bilateral iliac stents which was able to be evacuated.  I was able to pass the Fogarty catheter down to the ankle bilaterally.  There was posterior plaque in the left groin necessitating endarterectomy and patch angioplasty.  I was not happy with the signals in the left foot after embolectomy and so I exposed the below-knee popliteal artery and did a thrombectomy of the tibial vessels.  4 compartment fasciotomies of the left leg was performed.  I stapled the skin closed.  Indications: Earlier today the patient underwent recanalization of the occluded left iliac stents and right iliac stenting as well.  He developed acute ischemia in the left leg.  When I evaluated him he did not have any motor or sensory function and was in agonizing pain.  Both legs appeared mottled.  He also did not have signals in the right leg.  I discussed proceeding with emergent revascularization.  After his procedure the nurses did describe an episode of transient aphasia which he has had in the past.  Procedure:  The patient was identified in the holding area and taken to [facility] OR ROOM 08  The patient was then placed supine on the table. general anesthesia was administered.  The patient was prepped and draped in the usual sterile fashion.  A time out was called and antibiotics were administered.  A longitudinal incision was made  in the left groin.  Cautery was used divide subcutaneous tissue down to the femoral sheath which was opened sharply.  A small amount of hematoma around the artery was identified from prior catheterization.  I exposed the common femoral profundofemoral and superficial femoral artery and isolated them with Vesseloops.  The patient was bolused with heparin.  Heparin was redosed throughout the procedure.  A #11 blade was used to make an arteriotomy which was extended longitudinally with Potts scissors.  The prior [employer] was removed.  There was no flow within the common femoral artery but no thrombus was visualized.  There was a significant posterior plaque.  I inserted a Fogarty catheter proximally which went without resistance into the aorta.  I gently performed thrombectomy so as not to disrupt his recently placed stents.  A significant amount of thrombus was evacuated and blood flow was reestablished.  Approximately 6 passes were made until there was a negative pass and excellent inflow.  I then proceeded with endarterectomy of the common femoral artery.  I got a good distal endpoint just proximal to the profunda origin and tacked the plaque down.  I then passed the Fogarty catheter down the superficial femoral artery to the ankle as well as the profundofemoral artery and did evacuate thrombus in both vessels.  Thrombectomies were performed until a negative pass was achieved.  The vessels were flushed with heparin saline.  A core matrix patch was then brought onto the table.  Patch angioplasty was performed with running 5-0 Prolene.  Prior to completion the appropriate flushing maneuvers were performed and the anastomosis was  completed.  The patient had brisk superficial femoral and profundofemoral Doppler signals and an excellent pulse within the common femoral artery.  I was unable to get pedal Doppler signals however he did have a good popliteal signal.  I packed the groin with a lap pad and turned attention  towards the right groin as there was no right femoral pulse.  I wanted to let the left leg warm up to see if pedal Doppler signals returned.  A longitudinal left groin incision was made with a 10 blade.  Cautery was used divide subcutaneous tissue down to the femoral sheath which was opened sharply.  I exposed the common femoral artery from the inguinal ligament down to the bifurcation.  The superficial femoral artery and profundofemoral artery were also individually isolated with Vesseloops.  The common femoral artery was soft without significant plaque.  Therefore I elected to make a transverse arteriotomy in the distal common femoral artery.  There was no obvious thrombus however there was no backbleeding or antegrade blood flow.  #4 Fogarty catheter was then advanced retrograde up into the aorta without resistance.  Thrombectomy was performed, evacuating acute thrombus.  Several passes were made until a negative pass was achieved.  There was excellent inflow.  I then passed the Fogarty catheter down the profundofemoral artery and evacuated a small thrombus.  A second negative pass was performed.  The artery was flushed with heparin saline and occluded.  The catheter was then advanced down the superficial femoral artery to the ankle.  Thrombectomy was performed evacuating a small amount of thrombus.  A second negative pass was performed.  The artery was flushed with heparin saline.  I then closed the arteriotomy transversely with 5-0 Prolene.  There were excellent signals in the groin and popliteal artery however no Doppler signal was obtained at the ankle.  I then reevaluated the left foot.  Again I could not get pedal Doppler signals and the popliteal signal had diminished.  The groin signals were still very strong.  I then made a medial below-knee incision with a 10 blade.  Cautery was used about subcutaneous tissue.  The saphenous vein was not visualized or disturbed.  The fascia was divided with cautery.   The gastrocnemius muscle was reflected posteriorly.  The popliteal artery was exposed.  It was without a palpable pulse.  I divided the anterior tibial vein between silk ties so that I can isolate the tibioperoneal trunk and anterior tibial artery.  The below-knee popliteal artery was soft without calcified plaque.  I made a transverse arteriotomy at the origin of the anterior tibial artery.  There was good backbleeding from the tibioperoneal trunk and anterior tibial artery.  There was minimal bleeding from the popliteal artery.  I passed a #3 Fogarty and a #4 Fogarty up to the groin and performed thrombectomy, removing a 2 cm piece of thrombus.  With this maneuver excellent inflow was established.  I then tried to pass the #3 Fogarty catheter down the anterior tibial artery.  It would only go approximately 10 cm.  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  The catheter did go all the way down to the ankle when passed down the tibioperoneal trunk.  There was good backbleeding.  I then closed the arteriotomy transversely with 6-0 Prolene.  At this point, I was able to get a dorsalis pedis signal on the right leg and a faint signal in the anterior tibial on the  left.  I took down the tibial attachments of the gastrocnemius muscle entering the deep posterior compartment and used Metzenbaum scissors to fully open the superficial deep compartment.  I then made a lateral incision between the tibia and fibula.  The anterior and lateral compartments were then opened with cautery and released down to the ankle with Metzenbaum scissors.  There was mild swelling in the muscle.  All muscle appeared viable.  At this point I continued to have a Doppler signal in the anterior tibial artery bilaterally.  I elected to stop at this time and to rewarm the patient and minimize his pressors to see if his blood flow improves.  I elected to close the skin on the fasciotomy sites as this was  not under significant tension at this time.  The groins were then irrigated.  Hemostasis was achieved.  I did not reverse the heparin.  The groins were closed by reapproximating the femoral sheath and the subcutaneous tissue with 2-0 and 3-0 Vicryl.  The skin was closed with 3-0 Vicryl.  Prevena wound vacs were then placed.  The patient was brought to the ICU and remained intubated.   Disposition: To ICU intubated.   Juleen China, M.D., [facility] [place] Office: [phone] Pager:  [phone]

## 2020-02-22 NOTE — Anesthesia Procedure Notes (Signed)
Procedure Name: Intubation Performed by: Mathews Argyle, CRNA Pre-anesthesia Checklist: Patient identified, Patient being monitored, Timeout performed, Emergency Drugs available and Suction available Patient Re-evaluated:Patient Re-evaluated prior to induction Oxygen Delivery Method: Circle system utilized Preoxygenation: Pre-oxygenation with 100% oxygen Induction Type: IV induction and Rapid sequence Laryngoscope Size: 3 and McGraph Grade View: Grade I Tube type: Oral Tube size: 8.0 mm Number of attempts: 1 Airway Equipment and Method: Stylet and Video-laryngoscopy Placement Confirmation: ETT inserted through vocal cords under direct vision,  positive ETCO2 and breath sounds checked- equal and bilateral Secured at: 21 cm Tube secured with: Tape Dental Injury: Teeth and Oropharynx as per pre-operative assessment

## 2020-02-22 NOTE — Progress Notes (Signed)
    Subjective  -  Yesterday, the patient under went recanalization of an occluded left iiliac with subsequent stenting of bilateral iliac arteries.  He is now complaining of significant pain in his left leg with the loss of sensation and motor function   Physical Exam:  No palpable femoral pulses.  No pedal doppler signals Mottling to both legs No motor or sensory function of the left leg       Assessment/Plan:  Acute left leg ischemia  I discussed emergent embolectomy, possible bilateral for acute ischemia.  I am concerned that the right leg is ischemic as well.  He reportedly had a palpable right DP 10 minutes ago, but I can not feel a femoral pulse or get a pedal doppler signal  Wells Jonathan [name] 02/22/2020 12:40 AM --  Vitals:   02/22/20 0009 02/22/20 0022  BP: (!) 189/92 (!) 189/92  Pulse: 97 99  Resp:    Temp:    SpO2: 98% 99%    Intake/Output Summary (Last 24 hours) at 02/22/2020 0040 Last data filed at 02/21/2020 2321 Gross per 24 hour  Intake --  Output 250 ml  Net -250 ml     Laboratory CBC    Component Value Date/Time   WBC 9.1 09/17/2019 0927   HGB 17.7 (H) 09/17/2019 0927   HGB 16.8 08/14/2012 1008   HCT 51.5 09/17/2019 0927   HCT 49.0 08/14/2012 1008   PLT 238 09/17/2019 0927   PLT 291 08/14/2012 1008    BMET    Component Value Date/Time   NA 140 09/17/2019 0927   NA 138 08/14/2012 1008   K 3.9 09/17/2019 0927   K 3.9 08/14/2012 1008   CL 101 09/17/2019 0927   CL 103 08/14/2012 1008   CO2 26 09/17/2019 0927   CO2 27 08/14/2012 1008   GLUCOSE 146 (H) 09/17/2019 0927   GLUCOSE 84 08/14/2012 1008   BUN 13 02/21/2020 1618   BUN 10 08/14/2012 1008   CREATININE 0.68 02/21/2020 1618   CREATININE 0.70 08/14/2012 1008   CALCIUM 9.9 09/17/2019 0927   CALCIUM 9.3 08/14/2012 1008   GFRNONAA >60 02/21/2020 1618   GFRNONAA >60 08/14/2012 1008   GFRAA >60 02/21/2020 1618   GFRAA >60 08/14/2012 1008    COAG Lab Results  Component Value Date    INR 0.9 09/17/2019   No results found for: PTT  Antibiotics Anti-infectives (From admission, onward)   Start     Dose/Rate Route Frequency Ordered Stop   02/21/20 1600  ceFAZolin (ANCEF) IVPB 2g/100 mL premix        2 g 200 mL/hr over 30 Minutes Intravenous  Once 02/21/20 1555 02/21/20 1724       V. Charlena Cross, M.D., [facility] [place] Office: [phone] Pager:  [phone]

## 2020-02-22 NOTE — Anesthesia Preprocedure Evaluation (Addendum)
Anesthesia Evaluation  Patient identified by MRN, date of birth, ID band Patient awake    Reviewed: Allergy & Precautions, H&P , NPO status , Patient's Chart, lab work & pertinent test results  History of Anesthesia Complications Negative for: history of anesthetic complications  Airway Mallampati: II  TM Distance: >3 FB     Dental  (+) Upper Dentures, Lower Dentures   Pulmonary neg sleep apnea, neg COPD, Current Smoker,    breath sounds clear to auscultation       Cardiovascular hypertension, (-) angina+ Peripheral Vascular Disease  (-) Past MI and (-) Cardiac Stents (-) dysrhythmias  Rhythm:regular Rate:Tachycardia     Neuro/Psych -H/o stroke with aphasia in past -Episode of aphasia/confusion lasting 5 minutes occurred about 5 hours ago per ner nursing notes, now back to baseline mental status TIACVA negative psych ROS   GI/Hepatic negative GI ROS, Neg liver ROS,   Endo/Other  negative endocrine ROS  Renal/GU      Musculoskeletal  (+) Arthritis ,   Abdominal   Peds  Hematology negative hematology ROS (+)   Anesthesia Other Findings Yesterday underwent recanalization of an occluded left iliac with subsequent stenting of bilateral iliac arteries, now presenting with ischemic left leg, possibly ischemic right leg, for emergent fem-fem bypass graft  Past Medical History: No date: Aphasia S/P CVA No date: DDD (degenerative disc disease), cervical No date: ED (erectile dysfunction) No date: Hyperlipidemia No date: Hypertension No date: Peripheral arterial disease (HCC) No date: Stroke [facility])  Past Surgical History: 07/21/2015: PERIPHERAL VASCULAR CATHETERIZATION; N/A     Comment:  Procedure: Abdominal Aortogram w/Lower Extremity;                Surgeon: Renford Dills, MD;  Location: [facility] INVASIVE               CV LAB;  Service: Cardiovascular;  Laterality: N/A; 07/21/2015: PERIPHERAL VASCULAR CATHETERIZATION      Comment:  Procedure: Lower Extremity Intervention;  Surgeon:               Renford Dills, MD;  Location: [facility] INVASIVE CV LAB;                Service: Cardiovascular;;  BMI    Body Mass Index: 27.49 kg/m      Reproductive/Obstetrics negative OB ROS                           Anesthesia Physical Anesthesia Plan  ASA: IV and emergent  Anesthesia Plan: General ETT and Rapid Sequence   Post-op Pain Management:    Induction: Rapid sequence  PONV Risk Score and Plan: Ondansetron, Dexamethasone and Treatment may vary due to age or medical condition  Airway Management Planned:   Additional Equipment: Arterial line and Ultrasound Guidance Line Placement  Intra-op Plan:   Post-operative Plan: Post-operative intubation/ventilation  Informed Consent: I have reviewed the patients History and Physical, chart, labs and discussed the procedure including the risks, benefits and alternatives for the proposed anesthesia with the patient or authorized representative who has indicated his/her understanding and acceptance.     Dental Advisory Given  Plan Discussed with: Anesthesiologist, CRNA and Surgeon  Anesthesia Plan Comments:        Anesthesia Quick Evaluation

## 2020-02-23 ENCOUNTER — Inpatient Hospital Stay: Payer: BC Managed Care – PPO

## 2020-02-23 ENCOUNTER — Inpatient Hospital Stay (HOSPITAL_COMMUNITY): Payer: BC Managed Care – PPO

## 2020-02-23 ENCOUNTER — Inpatient Hospital Stay (HOSPITAL_COMMUNITY)
Admission: AD | Admit: 2020-02-23 | Discharge: 2020-03-10 | DRG: 064 | Disposition: A | Discharge status: Skilled Nursing Facility | Payer: BC Managed Care – PPO | Attending: Internal Medicine | Admitting: Internal Medicine

## 2020-02-23 DIAGNOSIS — Z720 Tobacco use: Secondary | ICD-10-CM | POA: Diagnosis not present

## 2020-02-23 DIAGNOSIS — I248 Other forms of acute ischemic heart disease: Secondary | ICD-10-CM | POA: Diagnosis present

## 2020-02-23 DIAGNOSIS — I482 Chronic atrial fibrillation, unspecified: Secondary | ICD-10-CM | POA: Diagnosis present

## 2020-02-23 DIAGNOSIS — J690 Pneumonitis due to inhalation of food and vomit: Secondary | ICD-10-CM | POA: Diagnosis not present

## 2020-02-23 DIAGNOSIS — E785 Hyperlipidemia, unspecified: Secondary | ICD-10-CM

## 2020-02-23 DIAGNOSIS — E876 Hypokalemia: Secondary | ICD-10-CM | POA: Diagnosis present

## 2020-02-23 DIAGNOSIS — R29721 NIHSS score 21: Secondary | ICD-10-CM | POA: Diagnosis present

## 2020-02-23 DIAGNOSIS — I63132 Cerebral infarction due to embolism of left carotid artery: Principal | ICD-10-CM | POA: Diagnosis present

## 2020-02-23 DIAGNOSIS — Z825 Family history of asthma and other chronic lower respiratory diseases: Secondary | ICD-10-CM

## 2020-02-23 DIAGNOSIS — R471 Dysarthria and anarthria: Secondary | ICD-10-CM | POA: Diagnosis present

## 2020-02-23 DIAGNOSIS — I69320 Aphasia following cerebral infarction: Secondary | ICD-10-CM | POA: Diagnosis not present

## 2020-02-23 DIAGNOSIS — I480 Paroxysmal atrial fibrillation: Secondary | ICD-10-CM | POA: Diagnosis present

## 2020-02-23 DIAGNOSIS — I745 Embolism and thrombosis of iliac artery: Secondary | ICD-10-CM | POA: Diagnosis not present

## 2020-02-23 DIAGNOSIS — M5030 Other cervical disc degeneration, unspecified cervical region: Secondary | ICD-10-CM | POA: Diagnosis present

## 2020-02-23 DIAGNOSIS — I10 Essential (primary) hypertension: Secondary | ICD-10-CM | POA: Diagnosis present

## 2020-02-23 DIAGNOSIS — I6522 Occlusion and stenosis of left carotid artery: Secondary | ICD-10-CM

## 2020-02-23 DIAGNOSIS — G936 Cerebral edema: Secondary | ICD-10-CM | POA: Diagnosis present

## 2020-02-23 DIAGNOSIS — R29810 Facial weakness: Secondary | ICD-10-CM | POA: Diagnosis present

## 2020-02-23 DIAGNOSIS — I4821 Permanent atrial fibrillation: Secondary | ICD-10-CM | POA: Diagnosis not present

## 2020-02-23 DIAGNOSIS — Z8673 Personal history of transient ischemic attack (TIA), and cerebral infarction without residual deficits: Secondary | ICD-10-CM | POA: Diagnosis not present

## 2020-02-23 DIAGNOSIS — R0902 Hypoxemia: Secondary | ICD-10-CM | POA: Diagnosis not present

## 2020-02-23 DIAGNOSIS — Z79899 Other long term (current) drug therapy: Secondary | ICD-10-CM

## 2020-02-23 DIAGNOSIS — N5089 Other specified disorders of the male genital organs: Secondary | ICD-10-CM | POA: Diagnosis present

## 2020-02-23 DIAGNOSIS — Z8249 Family history of ischemic heart disease and other diseases of the circulatory system: Secondary | ICD-10-CM

## 2020-02-23 DIAGNOSIS — R4701 Aphasia: Secondary | ICD-10-CM

## 2020-02-23 DIAGNOSIS — I63512 Cerebral infarction due to unspecified occlusion or stenosis of left middle cerebral artery: Secondary | ICD-10-CM | POA: Diagnosis present

## 2020-02-23 DIAGNOSIS — D72829 Elevated white blood cell count, unspecified: Secondary | ICD-10-CM | POA: Diagnosis not present

## 2020-02-23 DIAGNOSIS — R1310 Dysphagia, unspecified: Secondary | ICD-10-CM

## 2020-02-23 DIAGNOSIS — R7401 Elevation of levels of liver transaminase levels: Secondary | ICD-10-CM

## 2020-02-23 DIAGNOSIS — Z20822 Contact with and (suspected) exposure to covid-19: Secondary | ICD-10-CM | POA: Diagnosis present

## 2020-02-23 DIAGNOSIS — D62 Acute posthemorrhagic anemia: Secondary | ICD-10-CM | POA: Diagnosis present

## 2020-02-23 DIAGNOSIS — R0602 Shortness of breath: Secondary | ICD-10-CM

## 2020-02-23 DIAGNOSIS — F17210 Nicotine dependence, cigarettes, uncomplicated: Secondary | ICD-10-CM | POA: Diagnosis present

## 2020-02-23 DIAGNOSIS — I70229 Atherosclerosis of native arteries of extremities with rest pain, unspecified extremity: Secondary | ICD-10-CM | POA: Diagnosis present

## 2020-02-23 DIAGNOSIS — E870 Hyperosmolality and hypernatremia: Secondary | ICD-10-CM

## 2020-02-23 DIAGNOSIS — I4891 Unspecified atrial fibrillation: Secondary | ICD-10-CM | POA: Diagnosis not present

## 2020-02-23 DIAGNOSIS — G8191 Hemiplegia, unspecified affecting right dominant side: Secondary | ICD-10-CM | POA: Diagnosis present

## 2020-02-23 DIAGNOSIS — I998 Other disorder of circulatory system: Secondary | ICD-10-CM | POA: Diagnosis not present

## 2020-02-23 DIAGNOSIS — I6389 Other cerebral infarction: Secondary | ICD-10-CM | POA: Diagnosis not present

## 2020-02-23 DIAGNOSIS — Z7982 Long term (current) use of aspirin: Secondary | ICD-10-CM

## 2020-02-23 LAB — CBC
HCT: 21.7 % — ABNORMAL LOW (ref 39.0–52.0)
HCT: 21.3 % — ABNORMAL LOW (ref 39.0–52.0)
HCT: 22.3 % — ABNORMAL LOW (ref 39.0–52.0)
Hemoglobin: 7.3 g/dL — ABNORMAL LOW (ref 13.0–17.0)
Hemoglobin: 7.2 g/dL — ABNORMAL LOW (ref 13.0–17.0)
Hemoglobin: 7.4 g/dL — ABNORMAL LOW (ref 13.0–17.0)
MCH: 31.9 pg (ref 26.0–34.0)
MCH: 31.9 pg (ref 26.0–34.0)
MCH: 31 pg (ref 26.0–34.0)
MCHC: 33.6 g/dL (ref 30.0–36.0)
MCHC: 33.8 g/dL (ref 30.0–36.0)
MCHC: 33.2 g/dL (ref 30.0–36.0)
MCV: 94.8 fL (ref 80.0–100.0)
MCV: 94.2 fL (ref 80.0–100.0)
MCV: 93.3 fL (ref 80.0–100.0)
Platelets: 136 10*3/uL — ABNORMAL LOW (ref 150–400)
Platelets: 126 10*3/uL — ABNORMAL LOW (ref 150–400)
Platelets: UNDETERMINED 10*3/uL (ref 150–400)
RBC: 2.29 MIL/uL — ABNORMAL LOW (ref 4.22–5.81)
RBC: 2.26 MIL/uL — ABNORMAL LOW (ref 4.22–5.81)
RBC: 2.39 MIL/uL — ABNORMAL LOW (ref 4.22–5.81)
RDW: 12.8 % (ref 11.5–15.5)
RDW: 12.8 % (ref 11.5–15.5)
RDW: 13.1 % (ref 11.5–15.5)
WBC: 10.1 10*3/uL (ref 4.0–10.5)
WBC: 9.9 10*3/uL (ref 4.0–10.5)
WBC: 10.1 10*3/uL (ref 4.0–10.5)
nRBC: 0 % (ref 0.0–0.2)
nRBC: 0 % (ref 0.0–0.2)
nRBC: 0 % (ref 0.0–0.2)

## 2020-02-23 LAB — COMPREHENSIVE METABOLIC PANEL
ALT: 46 U/L — ABNORMAL HIGH (ref 0–44)
AST: 203 U/L — ABNORMAL HIGH (ref 15–41)
Albumin: 2.3 g/dL — ABNORMAL LOW (ref 3.5–5.0)
Alkaline Phosphatase: 34 U/L — ABNORMAL LOW (ref 38–126)
Anion gap: 10 (ref 5–15)
BUN: 11 mg/dL (ref 6–20)
CO2: 24 mmol/L (ref 22–32)
Calcium: 7.9 mg/dL — ABNORMAL LOW (ref 8.9–10.3)
Chloride: 105 mmol/L (ref 98–111)
Creatinine, Ser: 0.68 mg/dL (ref 0.61–1.24)
GFR calc Af Amer: >60 mL/min (ref >60)
GFR calc non Af Amer: >60 mL/min (ref >60)
Glucose, Bld: 159 mg/dL — ABNORMAL HIGH (ref 70–99)
Potassium: 3.3 mmol/L — ABNORMAL LOW (ref 3.5–5.1)
Sodium: 139 mmol/L (ref 135–145)
Total Bilirubin: 0.6 mg/dL (ref 0.3–1.2)
Total Protein: 4.6 g/dL — ABNORMAL LOW (ref 6.5–8.1)

## 2020-02-23 LAB — TRIGLYCERIDES: Triglycerides: 186 mg/dL — ABNORMAL HIGH (ref <150)

## 2020-02-23 LAB — BASIC METABOLIC PANEL
Anion gap: 7 (ref 5–15)
BUN: 18 mg/dL (ref 6–20)
CO2: 24 mmol/L (ref 22–32)
Calcium: 7.4 mg/dL — ABNORMAL LOW (ref 8.9–10.3)
Chloride: 107 mmol/L (ref 98–111)
Creatinine, Ser: 0.84 mg/dL (ref 0.61–1.24)
GFR calc Af Amer: >60 mL/min (ref >60)
GFR calc non Af Amer: >60 mL/min (ref >60)
Glucose, Bld: 158 mg/dL — ABNORMAL HIGH (ref 70–99)
Potassium: 3.7 mmol/L (ref 3.5–5.1)
Sodium: 138 mmol/L (ref 135–145)

## 2020-02-23 LAB — PREPARE RBC (CROSSMATCH)

## 2020-02-23 LAB — APTT
aPTT: 33 seconds (ref 24–36)
aPTT: 43 seconds — ABNORMAL HIGH (ref 24–36)
aPTT: 46 seconds — ABNORMAL HIGH (ref 24–36)
aPTT: 27 seconds (ref 24–36)

## 2020-02-23 LAB — HEMOGLOBIN A1C
Hgb A1c MFr Bld: 6.2 % — ABNORMAL HIGH (ref 4.8–5.6)
Mean Plasma Glucose: 131.24 mg/dL

## 2020-02-23 LAB — LACTIC ACID, PLASMA: Lactic Acid, Venous: 1.4 mmol/L (ref 0.5–1.9)

## 2020-02-23 LAB — GLUCOSE, CAPILLARY
Glucose-Capillary: 130 mg/dL — ABNORMAL HIGH (ref 70–99)
Glucose-Capillary: 136 mg/dL — ABNORMAL HIGH (ref 70–99)
Glucose-Capillary: 138 mg/dL — ABNORMAL HIGH (ref 70–99)
Glucose-Capillary: 146 mg/dL — ABNORMAL HIGH (ref 70–99)
Glucose-Capillary: 164 mg/dL — ABNORMAL HIGH (ref 70–99)

## 2020-02-23 LAB — MRSA PCR SCREENING: MRSA by PCR: NEGATIVE

## 2020-02-23 LAB — PHOSPHORUS: Phosphorus: 1.9 mg/dL — ABNORMAL LOW (ref 2.5–4.6)

## 2020-02-23 LAB — SODIUM: Sodium: 139 mmol/L (ref 135–145)

## 2020-02-23 LAB — MAGNESIUM: Magnesium: 1.9 mg/dL (ref 1.7–2.4)

## 2020-02-23 MED ORDER — POLYETHYLENE GLYCOL 3350 17 G PO PACK: 17.0000 g | PACK | Freq: Every day | ORAL | 0 refills | Status: DC

## 2020-02-23 MED ORDER — FAMOTIDINE IN NACL 20-0.9 MG/50ML-% IV SOLN
20.0000 mg | Freq: Two times a day (BID) | INTRAVENOUS | Status: DC
  Administered 2020-02-23 – 2020-02-24 (×2): 20 mg via INTRAVENOUS
  Filled 2020-02-23: qty 50

## 2020-02-23 MED ORDER — SODIUM CHLORIDE 0.9% IV SOLUTION: Freq: Once | INTRAVENOUS | Status: DC

## 2020-02-23 MED ORDER — ARGATROBAN 50 MG/50ML IV SOLN
0.5500 ug/kg/min | INTRAVENOUS | Status: DC
  Administered 2020-02-23: 0.55 ug/kg/min via INTRAVENOUS
  Filled 2020-02-23: qty 50

## 2020-02-23 MED ORDER — GUAIFENESIN-DM 100-10 MG/5ML PO SYRP: 15.0000 mL | ORAL_SOLUTION | ORAL | 0 refills | Status: DC | PRN

## 2020-02-23 MED ORDER — INSULIN ASPART 100 UNIT/ML ~~LOC~~ SOLN: 0.0000 [IU] | SUBCUTANEOUS | 11 refills | Status: DC

## 2020-02-23 MED ORDER — ONDANSETRON HCL 4 MG/2ML IJ SOLN: 4.0000 mg | Freq: Four times a day (QID) | INTRAMUSCULAR | 0 refills | Status: DC | PRN

## 2020-02-23 MED ORDER — ATENOLOL 50 MG PO TABS: 50.0000 mg | ORAL_TABLET | Freq: Every day | ORAL | 3 refills | Status: DC

## 2020-02-23 MED ORDER — CHLORHEXIDINE GLUCONATE 0.12 % MT SOLN
15.0000 mL | Freq: Two times a day (BID) | OROMUCOSAL | Status: DC
  Administered 2020-02-23 – 2020-02-24 (×2): 15 mL via OROMUCOSAL

## 2020-02-23 MED ORDER — STROKE: EARLY STAGES OF RECOVERY BOOK: Freq: Once | Status: DC

## 2020-02-23 MED ORDER — NICOTINE 21 MG/24HR TD PT24: 21.0000 mg | MEDICATED_PATCH | Freq: Every day | TRANSDERMAL | 0 refills | Status: DC

## 2020-02-23 MED ORDER — IOHEXOL 350 MG/ML SOLN
100.0000 mL | Freq: Once | INTRAVENOUS | Status: AC | PRN
  Administered 2020-02-23: 100 mL via INTRAVENOUS

## 2020-02-23 MED ORDER — POLYETHYLENE GLYCOL 3350 17 G PO PACK: 17.0000 g | PACK | Freq: Every day | ORAL | Status: DC | PRN

## 2020-02-23 MED ORDER — ASPIRIN 81 MG PO CHEW: 324.0000 mg | CHEWABLE_TABLET | Freq: Once | ORAL | Status: DC

## 2020-02-23 MED ORDER — DOCUSATE SODIUM 100 MG PO CAPS: 100.0000 mg | ORAL_CAPSULE | Freq: Every day | ORAL | 0 refills | Status: DC

## 2020-02-23 MED ORDER — POTASSIUM PHOSPHATES 15 MMOLE/5ML IV SOLN
20.0000 mmol | Freq: Once | INTRAVENOUS | Status: AC
  Administered 2020-02-23: 20 mmol via INTRAVENOUS
  Filled 2020-02-23: qty 6.67

## 2020-02-23 MED ORDER — BENAZEPRIL HCL 20 MG PO TABS: 20.0000 mg | ORAL_TABLET | Freq: Every day | ORAL | 1 refills | Status: DC

## 2020-02-23 MED ORDER — ENOXAPARIN SODIUM 40 MG/0.4ML ~~LOC~~ SOLN: 40.0000 mg | SUBCUTANEOUS | Status: DC

## 2020-02-23 MED ORDER — ASPIRIN 81 MG PO CHEW: 324.0000 mg | CHEWABLE_TABLET | Freq: Once | ORAL | 0 refills | Status: DC

## 2020-02-23 MED ORDER — ALBUTEROL SULFATE (2.5 MG/3ML) 0.083% IN NEBU: 3.0000 mL | INHALATION_SOLUTION | Freq: Four times a day (QID) | RESPIRATORY_TRACT | 12 refills | Status: DC | PRN

## 2020-02-23 MED ORDER — SODIUM CHLORIDE 3 % IV SOLN
INTRAVENOUS | Status: DC
  Filled 2020-02-23 (×11): qty 500

## 2020-02-23 MED ORDER — DOCUSATE SODIUM 100 MG PO CAPS: 100.0000 mg | ORAL_CAPSULE | Freq: Two times a day (BID) | ORAL | Status: DC | PRN

## 2020-02-23 MED ORDER — ATORVASTATIN CALCIUM 20 MG PO TABS: 80.0000 mg | ORAL_TABLET | Freq: Every day | ORAL | Status: DC

## 2020-02-23 MED ORDER — DOCUSATE SODIUM 50 MG/5ML PO LIQD: 100.0000 mg | Freq: Two times a day (BID) | ORAL | 0 refills | Status: DC

## 2020-02-23 MED ORDER — CLOPIDOGREL BISULFATE 75 MG PO TABS: 75.0000 mg | ORAL_TABLET | Freq: Every day | ORAL | 30 refills | Status: DC

## 2020-02-23 MED ORDER — ALBUTEROL SULFATE (2.5 MG/3ML) 0.083% IN NEBU: 2.5000 mg | INHALATION_SOLUTION | RESPIRATORY_TRACT | Status: DC | PRN

## 2020-02-23 MED ORDER — SODIUM CHLORIDE 0.9 % IV SOLN: INTRAVENOUS | Status: DC

## 2020-02-23 MED ORDER — ASPIRIN 325 MG PO TABS: 325.0000 mg | ORAL_TABLET | Freq: Every day | ORAL | Status: DC

## 2020-02-23 MED ORDER — ACETAMINOPHEN 650 MG RE SUPP: 650.0000 mg | Freq: Four times a day (QID) | RECTAL | 0 refills | Status: DC | PRN

## 2020-02-23 MED ORDER — ALUM & MAG HYDROXIDE-SIMETH 200-200-20 MG/5ML PO SUSP: 15.0000 mL | ORAL | 0 refills | Status: DC | PRN

## 2020-02-23 MED ORDER — ONDANSETRON HCL 4 MG/2ML IJ SOLN: 4.0000 mg | Freq: Four times a day (QID) | INTRAMUSCULAR | Status: DC | PRN

## 2020-02-23 MED ORDER — CHLORHEXIDINE GLUCONATE CLOTH 2 % EX PADS
6.0000 | MEDICATED_PAD | Freq: Every day | CUTANEOUS | Status: DC
  Administered 2020-02-24 – 2020-03-10 (×16): 6 via TOPICAL

## 2020-02-23 MED ORDER — ATORVASTATIN CALCIUM 80 MG PO TABS: 80.0000 mg | ORAL_TABLET | Freq: Every day | ORAL | 1 refills | Status: DC

## 2020-02-23 MED ORDER — CLOPIDOGREL BISULFATE 75 MG PO TABS: 75.0000 mg | ORAL_TABLET | Freq: Every day | ORAL | Status: DC

## 2020-02-23 MED ORDER — CHLORHEXIDINE GLUCONATE 0.12% ORAL RINSE (MEDLINE KIT): 15.0000 mL | Freq: Two times a day (BID) | OROMUCOSAL | 0 refills | Status: DC

## 2020-02-23 MED ORDER — ORAL CARE MOUTH RINSE: 15.0000 mL | Freq: Two times a day (BID) | OROMUCOSAL | Status: DC

## 2020-02-23 MED ORDER — IPRATROPIUM-ALBUTEROL 0.5-2.5 (3) MG/3ML IN SOLN: 3.0000 mL | RESPIRATORY_TRACT | Status: DC | PRN

## 2020-02-23 NOTE — Progress Notes (Signed)
CRITICAL CARE   Patient was noted to have slurred speech consistent with acute CVA. He was communicative last night and this morning with last normal around 8AM - 02/23/20. Ordered stat CTH and MRI at 2pm with code stroke activation if +.    Vida Rigger, M.D.  Pulmonary & Critical Care Medicine  [facility]

## 2020-02-23 NOTE — Consult Note (Addendum)
ANTICOAGULATION CONSULT NOTE   Pharmacy Consult for Argatroban dosing and monitoring  Indication: Possible HIT  No Known Allergies  Patient Measurements: Height: 5\' 7"  (170.2 cm) Weight: 79.6 kg (175 lb 8 oz) Actual BW/kg (Calculated) : 79.4 kg   Vital Signs: Temp: 99.3 F (37.4 C) (08/08 0200) BP: 157/68 (08/08 0200) Pulse Rate: 91 (08/08 0200)  Labs: Recent Labs    02/22/20 0210 02/22/20 0210 02/22/20 1137 02/22/20 1137 02/22/20 1542 02/22/20 2200 02/23/20 0124 02/23/20 0401  HGB 12.0*   < > 9.3*   < >  --   --  7.3* 7.2*  HCT 35.2*   < > 26.6*  --   --   --  21.7* 21.3*  PLT 170   < > 148*  --   --   --  136* 126*  APTT 34  --   --   --    < > 29 33 43*  LABPROT 13.5  --   --   --   --   --   --   --   INR 1.1  --   --   --   --   --   --   --   HEPARINUNFRC <0.10*  --   --   --   --   --   --   --   CREATININE 0.87  --  0.86  --   --   --  0.84  --    < > = values in this interval not displayed.    Estimated Creatinine Clearance: 95.8 mL/min (by C-G formula based on SCr of 0.84 mg/dL).   Medical History: Past Medical History:  Diagnosis Date  . Aphasia S/P CVA   . DDD (degenerative disc disease), cervical   . ED (erectile dysfunction)   . Hyperlipidemia   . Hypertension   . Peripheral arterial disease (HCC)   . Stroke [facility])     Assessment: Pharmacy consulted for argatroban dosing and monitoring for possible HIT. Patient admitted 8/6 with ischemic left lower extremity and initiated on heparin infusion. Patient under went recanalization of occluded left iliac w/stenting of bilateral iliac arteries 8/6. On 8/7 AM patient was complaining of significant leg pain with loss of sensation. He was taken to operating room for emergent revascularization where multiple clots were found.   After discussion with Dr. 10/7, argatroban was initiated at half rate due to high risk of bleeds following surgical procedure 8/7.  8/7 @ 1400 argatroban started @  0.9mcg/kg/min.  8/7 @ 1542 aPTT: 28. Level is subtherapeutic.   Will increase argatroban by ~30% to 0.325 mcg/kg/min (25.87 mcg/min). 8/7 @ 1851 aPTT: 28. Level is subtherapeutic.   Will increase argatroban by ~30% to 0.4225 mcg/kg/min (33.63 mcg/min). 8/7 @ 2304  aPTT: 29. Level is subtherapeutic.   Will increase argatroban by ~30% to 0.5500 mcg/kg/min (43.78 mcg/min). 8/8 @ 0200  aPTT: 33. Level is subtherapeutic.  Hemoglobin 12> 9.3> 7.3. D/w ICU nurse bleedings concerns and was informed there are no active bleeding events. Per Dr. 10/7, do not change rate from 0.5500 mcg/kg/min and to recheck aPTT.  8/8 @ 0435 aPTT: 43. Level is subtherapeutic with no rate change. Hgb 7.2. Confirmed with nurse no active bleeding.  Goal of Therapy:  aPTT 50-90 seconds Monitor platelets by anticoagulation protocol: Yes   Plan:  8/8 @ 0200  aPTT: 43. Level is subtherapeutic. Notified Dr. Myra Gianotti and awaiting reply.  Will CONTINUE current rate of 0.5500 mcg/kg/min given prior  discussion with Dr. Myra Gianotti (bleeding concern) and will await provider's response on whether rate should be increased.   Will order aPTT in 2 hours.   Pharmacy will adjust infusion rate based on aPTT levels.    Katha Cabal 02/23/2020 4:42 AM

## 2020-02-23 NOTE — Evaluation (Signed)
Occupational Therapy Evaluation Patient Details Name: Jonathan [name] MRN: [id number] DOB: 07/21/1960 Today's Date: 02/23/2020    History of Present Illness Pt is 59 y/o M with PMH: DDD, HTN, HLD, h/o CVA with some acute aphasia (wife reports his speech was normal before this admission), and PAD s/p stent in 2017. Presented with L LE pain. Now s/p angio with PCI and stent placement on 8/6 and revascularization of L LE on 8/7. Extubated 02/23/20. Of note, during OT assessment, significant R sided weakness and significant aphasia present. OT notified RN immediately who called code stroke.   Clinical Impression   Pt was seen for OT evaluation this date in setting of acute hospitalization for L LE pain now s/p Now s/p angio with PCI and stent placement on 8/6 and revascularization of L LE on 8/7 . Prior to hospital admission, pt was Indep with all I/ADLs and was working and driving. Pt lives in [facility] with 2 STE at the primarily used entrance (side entrance) with no railing. Currently pt demonstrates impairments as described below (See OT problem list) which functionally limit his ability to perform ADL/self-care tasks. Pt currently requires MOD A to participate in bed level UB grooming ADLs and is unsafe to mobilize as pt's heart rate significantly increases at bed level/at rest, with his attempts to communicate (from 107bpm to 141bpm during OT assessment).  Pt appears to have difficulty with expressive speech t/o OT assessment as well as R sided weakness of UE and LE. Basic assessment data gathered, but mobility deferred d/t safety concerns. RN notified by this author immediately and she calls code stroke. Dr. Karna Christmas present on unit and aware as well. At this time, it appears plan is for pt to transfer to [facility]. Anticipate that CIR is most appropriate d/c recommendation based on current functional performance relative to PLOF.     Follow Up Recommendations  CIR    Equipment Recommendations  Other  (comment) (defer to next venue of care)    Recommendations for Other Services Rehab consult     Precautions / Restrictions Precautions Precautions: Fall Restrictions Weight Bearing Restrictions: No      Mobility Bed Mobility               General bed mobility comments: NT d/t concerns re: stroke-like symptoms. Reported to RN and mobility deferred this date.  Transfers                 General transfer comment: NT    Balance                                           ADL either performed or assessed with clinical judgement   ADL Overall ADL's : Needs assistance/impaired                                       General ADL Comments: Minimal ADL assessment able to be completed. Pt requires MOD A to perform oral care with swab at bed level with HOB elevated. Only able to contribute with L hand, but requires hand over hand d/t decreased coordination and R sided facial droop.     Vision   Additional Comments: unable to formally assess as pt with difficulty communicating. Pt appears to have less visual attention/potential visual neglect to R  side and is more engaged when OT speaks to him from his L side.     Perception     Praxis      Pertinent Vitals/Pain Pain Assessment: Faces Faces Pain Scale: No hurt     Hand Dominance Right (per spouse)   Extremity/Trunk Assessment Upper Extremity Assessment Upper Extremity Assessment: RUE deficits/detail;LUE deficits/detail RUE Deficits / Details: elbow, wrist, hand all 0/5, shld 1/5 MMT. Unable to initiate movement against gravity. RUE Coordination: decreased fine motor;decreased gross motor LUE Deficits / Details: WFL ROM and MMT   Lower Extremity Assessment Lower Extremity Assessment: RLE deficits/detail;LLE deficits/detail;Defer to PT evaluation RLE Deficits / Details: hip grossly 1/5 MMT. Knee, ankle and foot MMT all 0/5. LLE Deficits / Details: assessed at bed level d/t concerns  re: R UE/R LE weakness. ROM and MMT of L LE appear to be [facility].       Communication Communication Communication: Expressive difficulties (Pt with significant expressive aphasia noted on assessment which pt's spouse reports is different from baseline. OT noted that nursing notes indicated short period of slurred speech on 8/6 but had resolved. OT notifies RN immediately of findings.)   Cognition Arousal/Alertness: Awake/alert Behavior During Therapy: Restless;Agitated;Anxious Overall Cognitive Status: Difficult to assess                                 General Comments: Pt's spouse reoprts that at baseline, he is alert and oriented with clear speech and appropriate communication. This date, pt is difficult to understand, but clearly trying to communicate. He is able to follow simple one step commands showing appropriate receptive communication. He becomes frustrated with being unable to make needs known at this time through speech. OT attempts to cue patient to relax, take his time as his HR elevates with his frustration (from 107 to 141bpm). When calmin tactics don't work, OT leaves room and reports this finding to Charity fundraiser.   General Comments  NT    Exercises     Shoulder Instructions      Home Living Family/patient expects to be discharged to:: Private residence Living Arrangements: Spouse/significant other;Other (Comment) (2 grand-children) Available Help at Discharge: Family;Available PRN/intermittently Type of Home: House Home Access: Stairs to enter [employer] of Steps: 2 STE at side entrance from car port with no railing, 3 STE in back entrance to deck with one railing, 5 STE to front entrance with b/l railing.   Home Layout: One level     Bathroom Shower/Tub: Tub/shower unit         Home Equipment: Cane - single point   Additional Comments: spouse via phone states that her sister ordered pt SPC, but that he doesn't use. No other equipment.       Prior Functioning/Environment Level of Independence: Independent        Comments: Pt's spouse via phone indicates that he was completely Indep, working nights at [employer]" standing/walking/active work. Has called out x1wk d/t L LE pain.        OT Problem List: Decreased strength;Decreased range of motion;Decreased activity tolerance;Impaired balance (sitting and/or standing);Decreased coordination;Decreased cognition;Decreased knowledge of use of DME or AE;Cardiopulmonary status limiting activity;Impaired tone;Impaired UE functional use      OT Treatment/Interventions: Self-care/ADL training;Therapeutic exercise;DME and/or AE instruction;Therapeutic activities;Patient/family education;Balance training;Neuromuscular education    OT Goals(Current goals can be found in the care plan section) Acute Rehab OT Goals Patient Stated Goal: none stated OT Goal Formulation:  Patient unable to participate in goal setting Time For Goal Achievement: 03/08/20 Potential to Achieve Goals: Fair  OT Frequency: Min 2X/week   Barriers to D/C:            Co-evaluation              AM-PAC OT "6 Clicks" Daily Activity     Outcome Measure Help from another person eating meals?: A Lot Help from another person taking care of personal grooming?: A Lot Help from another person toileting, which includes using toliet, bedpan, or urinal?: Total Help from another person bathing (including washing, rinsing, drying)?: Total Help from another person to put on and taking off regular upper body clothing?: A Lot Help from another person to put on and taking off regular lower body clothing?: Total 6 Click Score: 9   End of Session Nurse Communication: Other (comment) (communicated to nurse re: concerns r/t HR increase at bed level with pt's attempts to utilize expressive speech, potentially new appearance of expressive aphasia and R sided weakness.)  Activity Tolerance: Treatment limited secondary to medical  complications (Comment) Patient left: in bed;with call bell/phone within reach;with bed alarm set;with nursing/sitter in room (nurse in room to assess pt.)  OT Visit Diagnosis: Muscle weakness (generalized) (M62.81);Other symptoms and signs involving the nervous system (R29.898);Hemiplegia and hemiparesis Hemiplegia - Right/Left: Right Hemiplegia - dominant/non-dominant: Dominant Hemiplegia - caused by: Cerebral infarction (unspecified at time of eval. After imaging, noted to have acute CVA)                Time: 0630-1601 OT Time Calculation (min): 18 min Charges:  OT General Charges $OT Visit: 1 Visit OT Evaluation $OT Eval Moderate Complexity: 1 [address], [place], OTR/L ascom [phone] 02/23/20, 4:57 PM

## 2020-02-23 NOTE — Progress Notes (Signed)
PULMONARY / CRITICAL CARE MEDICINE  Name: Jonathan [name] MRN: [id number] DOB: 23-Feb-1961    LOS: 1  Referring Provider:  Dr Myra Gianotti Reason for Referral:  Ventilator support  Brief patient description:   59 Y/O MALE presenting with leg ischemia. Now s/p angiography with PCI and stent placement   HPI:  This is a 59 y/o male who presented with a medical history as indicated below who was sent from [facility] for an urgent due to leg pain with severe claudication at rest.  He was diagnosed with Atherosclerotic occlusive disease bilateral lower extremities with rest pain of the left lower extremity; acute ischemia left lower extremity.  On 02/21/2020, he underwent:  Procedure(s) Performed:             1.  Abdominal aortogram             2.  Bilateral distal runoff             3.  Percutaneous transluminal angioplasty and stent placement right common iliac artery; "kissing balloon" technique             4.  Percutaneous transluminal angioplasty and stent placement left common iliac artery; "kissing balloon" technique             5.  Percutaneous transluminal angioplasty and stent placement bilateral external iliac arteries.             6.  Ultrasound guided access bilateral common femoral arteries             7.  StarClose closure device bilateral common femoral arteries  At about 11:51 PM, patient started complaining of severe left leg pain.  Upon assessment he had no palpable pulses in his left leg was mottled.  He was on a heparin drip at the time.  He was taken back to the OR by Dr. Cheree Ditto and underwent lower extremity angiography with PCI and stent placement in the left iliofemoral artery and right external iliac artery.  He has been kept intubated post procedure as he is due to return to the OR later today.  PCCM is consulted for vent management  SIGNIFICANT EVENTS: 02/21/2020: OR for revascularization with stent placement to the right common iliac artery and the  left common iliac artery 02/22/2020: Back to the OR for revascularization of the right and left iliac arteries; kept intubated and sedated 02/23/20- patient extubated yesteday liberated from MV, no events overnight. Disucssed with vascular surgeon no additional plan for surgery at this time. Will optimize for transfer, have paged [facility] for pick up.     Past Medical History:  Diagnosis Date  . Aphasia S/P CVA   . DDD (degenerative disc disease), cervical   . ED (erectile dysfunction)   . Hyperlipidemia   . Hypertension   . Peripheral arterial disease (HCC)   . Stroke [facility])    Past Surgical History:  Procedure Laterality Date  . PERIPHERAL VASCULAR CATHETERIZATION N/A 07/21/2015   Procedure: Abdominal Aortogram w/Lower Extremity;  Surgeon: Renford Dills, MD;  Location: [facility] INVASIVE CV LAB;  Service: Cardiovascular;  Laterality: N/A;  . PERIPHERAL VASCULAR CATHETERIZATION  07/21/2015   Procedure: Lower Extremity Intervention;  Surgeon: Renford Dills, MD;  Location: [facility] INVASIVE CV LAB;  Service: Cardiovascular;;   No current facility-administered medications on file prior to encounter.   Current Outpatient Medications on File Prior to Encounter  Medication Sig  . albuterol (VENTOLIN HFA) 108 (90 Base) MCG/ACT inhaler Inhale into the  lungs.  . amLODipine (NORVASC) 5 MG tablet Take 5 mg by mouth every morning.  [name] atenolol (TENORMIN) 50 MG tablet Take 50 mg by mouth daily.  [name] atorvastatin (LIPITOR) 40 MG tablet Take 40 mg by mouth daily.  . clopidogrel (PLAVIX) 75 MG tablet TAKE 1 TABLET BY MOUTH EVERY DAY (Patient taking differently: Take 75 mg by mouth daily. )  . lisinopril (ZESTRIL) 20 MG tablet Take 20 mg by mouth daily.   [name] amLODipine-benazepril (LOTREL) 10-20 MG capsule Take 1 capsule by mouth daily.  (Patient not taking: Reported on 02/22/2020)  . aspirin 81 MG chewable tablet Chew by mouth. (Patient not taking: Reported on 02/22/2020)  . aspirin EC 325 MG tablet Take 1 tablet (325 mg  total) by mouth daily. (Patient not taking: Reported on 02/21/2020)    Allergies No Known Allergies  Family History Family History  Problem Relation Age of Onset  . Emphysema Mother   . Heart attack Father   . Heart attack Paternal Grandmother    Social History  reports that he has been smoking cigarettes. He has never used smokeless tobacco. He reports current alcohol use. He reports that he does not use drugs.  Review Of Systems: Unable to obtain as patient is intubated and sedated  VITAL SIGNS: BP (!) 157/75   Pulse 95   Temp 99.5 F (37.5 C)   Resp 17   Ht 5\' 7"  (1.702 m)   Wt 79.6 kg   SpO2 99%   BMI 27.49 kg/m   HEMODYNAMICS:    VENTILATOR SETTINGS: Vent Mode: PSV FiO2 (%):  [35 %] 35 % Set Rate:  [16 bmp] 16 bmp Vt Set:  [500 mL] 500 mL PEEP:  [5 cmH20] 5 cmH20 Pressure Support:  [5 cmH20] 5 cmH20 Plateau Pressure:  [9 cmH20-12 cmH20] 9 cmH20  INTAKE / OUTPUT: I/O last 3 completed shifts: In: 6537.3 [I.V.:6374.7; IV Piggyback:162.7] Out: 1650 [Urine:1550; Blood:100]  PHYSICAL EXAMINATION: General: Intubated, sedated and in no acute distress HEENT: PERRLA, trachea midline, no JVD Neuro: Withdraws to noxious stimulus, corneal reflexes intact Cardiovascular: Apical pulse regular, S1-S2, no murmur regurg or gallop Extremities: +2 pulses in bilateral upper extremities, bilateral lower extremities cool to touch, pulses only by Doppler, no bleeding noted from surgical incisions in bilateral lower extremities Lungs: Bilateral breath sounds with expiratory wheezes in right lung field, no rhonchi Abdomen: Nondistended, normal bowel sounds in all 4 quadrants Musculoskeletal: Positive range of motion, no joint deformities Skin: Warm and dry, mild cyanosis of left lower extremity  LABS:  BMET Recent Labs  Lab 02/22/20 0210 02/22/20 1137 02/23/20 0124  NA 138 139 138  K 3.5 5.0 3.7  CL 107 107 107  CO2 20* 24 24  BUN 16 14 18   CREATININE 0.87 0.86 0.84   GLUCOSE 246* 185* 158*    Electrolytes Recent Labs  Lab 02/22/20 0210 02/22/20 1137 02/23/20 0124  CALCIUM 7.9* 7.2* 7.4*    CBC Recent Labs  Lab 02/22/20 1137 02/23/20 0124 02/23/20 0401  WBC 12.4* 10.1 9.9  HGB 9.3* 7.3* 7.2*  HCT 26.6* 21.7* 21.3*  PLT 148* 136* 126*    Coag's Recent Labs  Lab 02/22/20 0210 02/22/20 1542 02/23/20 0124 02/23/20 0401 02/23/20 0630  APTT 34   < > 33 43* 46*  INR 1.1  --   --   --   --    < > = values in this interval not displayed.    Sepsis Markers Recent Labs  Lab 02/22/20  0242 02/23/20 0630  LATICACIDVEN 2.0* 1.4    ABG Recent Labs  Lab 02/22/20 0214 02/22/20 0417 02/22/20 0730  PHART 7.27* 7.08* 7.30*  PCO2ART 43 34 44  PO2ART 493* 99 136*    Liver Enzymes Recent Labs  Lab 02/22/20 0210  AST 25  ALT 32  ALKPHOS 37*  BILITOT 0.9  ALBUMIN 3.2*    Cardiac Enzymes No results for input(s): TROPONINI, PROBNP in the last 168 hours.  Glucose Recent Labs  Lab 02/21/20 2021 02/22/20 0632 02/23/20 0017 02/23/20 0346 02/23/20 0727  GLUCAP 241* 224* 164* 136* 130*    Imaging No results found.  STUDIES:  Bilateral lower extremity angiography  CULTURES: None  ANTIBIOTICS: Empiric antibiotics for surgical prophylaxis  LINES/TUBES: PIV's ET tube OG tube StarClose closure device bilateral common femoral arteries   DISCUSSION: 59 year old male presenting with severe atherosclerotic occlusive disease of bilateral lower extremities now s/p PCI with stent placement.  Patient is at extremely high risk for limb loss.  Currently intubated and sedated for procedural purposes.  ASSESSMENT  Acute limb ischemia- s/p vascular surgery as outlined in Op note.            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 GI prophylaxis: Protonix VTE prophylaxis: Already on heparin infusion  FAMILY  - Updates: No family at bedside.  Please update after interdisciplinary rounds    Critical care provider statement:    Critical care time (minutes):  33   Critical care time was exclusive of:  Separately billable procedures and  treating other patients   Critical care was necessary to treat or prevent imminent or  life-threatening deterioration of the following conditions:  critical limb ischemia, multiple comorbid conditions   Critical care was time spent personally by me on the following  activities:  Development of treatment plan with patient or surrogate,  discussions with consultants, evaluation of patient's response to  treatment, examination of patient, obtaining history from patient or  surrogate, ordering and performing treatments and interventions, ordering  and review of laboratory studies and re-evaluation of patient's condition   I assumed direction of critical care for this patient from another  provider in my specialty: no      Vida Rigger, M.D.  Pulmonary & Critical Care Medicine  [facility]

## 2020-02-23 NOTE — Consult Note (Signed)
Neurology Consultation [name] for Consult: Stroke Referring Physician: Genia Hotter  CC: Right-sided weakness  History is obtained from: Chart review  HPI: Jonathan [name] is a 59 y.o. male with a history of hypertension, hyperlipidemia who presented with acute critical limb ischemia secondary to occlusion of the iliac artery who underwent stenting on 8/6.  He was taken back to the OR on 8/7 found to have acute thrombosis of bilateral iliac stents underwent thrombectomy and endarterectomy.  He also underwent fasciotomies of the left leg.  There was concern for heparin-induced thrombocytopenia, though his platelets have not dropped significantly, and he was started on argatroban and a HIT panel was sent.  Today, he developed right hemiplegia with aphasia.  Due to the large size of his infarct, there is concern that he may develop malignant cerebral edema therefore he was transferred to [facility] for close neuro ICU monitoring.   LKW: 8 AM tpa given?: no, large infarct on initial head CT, on argatroban    ROS: Unable to obtain due to altered mental status.   Past Medical History:  Diagnosis Date   Aphasia S/P CVA    DDD (degenerative disc disease), cervical    ED (erectile dysfunction)    Hyperlipidemia    Hypertension    Peripheral arterial disease (HCC)    Stroke (HCC)      Family History  Problem Relation Age of Onset   Emphysema Mother    Heart attack Father    Heart attack Paternal Grandmother      Social History:  reports that he has been smoking cigarettes. He has never used smokeless tobacco. He reports current alcohol use. He reports that he does not use drugs.   Exam: Current vital signs: BP 131/65    Pulse 96    Temp (!) 100.6 F (38.1 C) (Axillary)    Resp (!) 22    SpO2 94%  Vital signs in last 24 hours: Temp:  [99.1 F (37.3 C)-100.6 F (38.1 C)] 100.6 F (38.1 C) (08/08 1720) Pulse Rate:  [88-126] 96 (08/08 1807) Resp:  [16-25] 22 (08/08  1807) BP: (131-171)/(63-99) 131/65 (08/08 1800) SpO2:  [94 %-100 %] 94 % (08/08 1800) Arterial Line BP: (124-166)/(55-86) 138/61 (08/08 1800)   Physical Exam  Constitutional: Appears well-developed and well-nourished.  Psych: Affect appropriate to situation Eyes: No scleral injection HENT: No OP obstruction MSK: no joint deformities.  Cardiovascular: Normal rate and regular rhythm.  Respiratory: Effort normal, non-labored breathing GI: Soft.  No distension. There is no tenderness.  Skin: WDI  Neuro: Mental Status: Patient is awake, alert, oriented to person, place, month, year, and situation.  Patient is able to give a clear and coherent history. No signs of aphasia or neglect Cranial Nerves: II:  Pupils are equal, round, and reactive to light.   III,IV, VI: L gaze deviation.  V: Facial sensation is symmetric to temperature VII: Facial movement with right facial weakness.  Motor: Tone is normal. Bulk is normal. R hemiplegia, he has minimal shoulder adduction, but no movement distally.  Sensory: Sensation is diminished on the left.  Cerebellar: Does not perform  I have reviewed labs in epic and the results pertinent to this consultation are: BMP - unremarkable  I have reviewed the images obtained: CT head - large area of developing edema in the L MCA distribution  Impression: 59 yo M with left MCA infarct and bilateral LE iliac stent thrombosis. With concern for HIT, he had been on argatroban, but with the  size of his infarct he is at high risk of hemorrhagic conversion. Also, it is very likely that he will need an emergent hemicraniectomy and anticoagulation could delay or preclude this. There is risk to his stents, but at the current time, unfortunately I think that the risk of anticoagulation is higher than holding it.   Recommendations: - HgbA1c, fasting lipid panel - MRI of the brain without contrast - Frequent neuro checks - Echocardiogram - Prophylactic  therapy-Antiplatelet med: Aspirin - dose 325mg  PO or 300mg  PR - Risk factor modification - Telemetry monitoring - PT consult, OT consult, Speech consult - Start hypertonic saline at 75 ml/hr - Will discuss options for hemicrani with neurosurgery.  - Stroke team to follow   This patient is critically ill and at significant risk of neurological worsening, death and care requires constant monitoring of vital signs, hemodynamics,respiratory and cardiac monitoring, neurological assessment, discussion with family, other specialists and medical decision making of high complexity. I spent 60 minutes of neurocritical care time  in the care of  this patient. This was time spent independent of any time provided by nurse practitioner or PA.  , MD [facility] [phone]  If 7pm- 7am, please page neurology on call as listed in AMION. 02/23/2020  7:42 PM

## 2020-02-23 NOTE — Progress Notes (Addendum)
1635 Transferred to [facility] [place] [facility] via [facility]. Wife called to inform of transfer. 1645 report called to [place] on [facility] [place]

## 2020-02-23 NOTE — Consult Note (Addendum)
ANTICOAGULATION CONSULT NOTE   Pharmacy Consult for Argatroban dosing and monitoring  Indication: Possible HIT  No Known Allergies  Patient Measurements: Height: 5\' 7"  (170.2 cm) Weight: 79.6 kg (175 lb 8 oz) Actual BW/kg (Calculated) : 79.4 kg   Vital Signs: Temp: 99.1 F (37.3 C) (08/08 0100) BP: 149/64 (08/08 0100) Pulse Rate: 88 (08/08 0100)  Labs: Recent Labs    02/22/20 0210 02/22/20 0210 02/22/20 1137 02/22/20 1542 02/22/20 1851 02/22/20 2200 02/23/20 0124  HGB 12.0*   < > 9.3*  --   --   --  7.3*  HCT 35.2*  --  26.6*  --   --   --  21.7*  PLT 170  --  148*  --   --   --  136*  APTT 34  --   --    < > 28 29 33  LABPROT 13.5  --   --   --   --   --   --   INR 1.1  --   --   --   --   --   --   HEPARINUNFRC <0.10*  --   --   --   --   --   --   CREATININE 0.87  --  0.86  --   --   --  0.84   < > = values in this interval not displayed.    Estimated Creatinine Clearance: 95.8 mL/min (by C-G formula based on SCr of 0.84 mg/dL).   Medical History: Past Medical History:  Diagnosis Date  . Aphasia S/P CVA   . DDD (degenerative disc disease), cervical   . ED (erectile dysfunction)   . Hyperlipidemia   . Hypertension   . Peripheral arterial disease (HCC)   . Stroke [facility])     Assessment: Pharmacy consulted for argatroban dosing and monitoring for possible HIT. Patient admitted 8/6 with ischemic left lower extremity and initiated on heparin infusion. Patient under went recanalization of occluded left iliac w/stenting of bilateral iliac arteries 8/6. On 8/7 AM patient was complaining of significant leg pain with loss of sensation. He was taken to operating room for emergent revascularization where multiple clots were found.   After discussion with Dr. 10/7, argatroban was initiated at half rate due to high risk of bleeds following surgical procedure 8/7.  8/7 @ 1400 argatroban started @ 0.66mcg/kg/min.  8/7 @ 1542 aPTT: 28. Level is subtherapeutic.   Will  increase argatroban by ~30% to 0.325 mcg/kg/min (25.87 mcg/min). 8/7 @ 1851 aPTT: 28. Level is subtherapeutic.   Will increase argatroban by ~30% to 0.4225 mcg/kg/min (33.63 mcg/min). 8/7 @ 2304  aPTT: 29. Level is subtherapeutic.   Will increase argatroban by ~30% to 0.5500 mcg/kg/min (43.78 mcg/min). Hemoglobin 12> 9.3> 7.3. D/w ICU nurse bleedings concerns and was informed there are no active bleeding events. Paged Dr. 10/7 and waiting response.    Goal of Therapy:  aPTT 50-90 seconds Monitor platelets by anticoagulation protocol: Yes   Plan:  8/8 @ 0200  aPTT: 33. Level is subtherapeutic.   D/w Dr. Myra Gianotti the downtrending Hgb and concern for bleeding. It was discussed to CONTINUE current rate of 0.5500 mcg/kg/min and follow up with next lab draw to determine appropriate rate. Nursing aware there are no changes to rate. Therefore, will NOT increase argatroban by ~30% to 0.7150 mcg/kg/min (56.77 mcg/min) for the above reason.  Will order aPTT and CBC in 2 hours.   Pharmacy will adjust infusion rate based on aPTT  levels.    Katha Cabal 02/23/2020 2:10 AM

## 2020-02-23 NOTE — Progress Notes (Signed)
PT Cancellation Note  Patient Details Name: Jonathan [name] MRN: [id number] DOB: 1960-09-09   Cancelled Treatment:    Reason Eval/Treat Not Completed: Patient not medically ready.  Per OT and chart review, recent stroke symptoms noted.  Pt now pending CT of head and MRI of brain.  Will hold PT at this time and re-attempt PT evaluation at a later date/time as medically appropriate.  Hendricks Limes, PT 02/23/20, 11:54 AM

## 2020-02-23 NOTE — Discharge Summary (Signed)
Physician Discharge Summary  Patient ID: Jonathan [name] MRN: [id number] DOB/AGE: [birth year] 59 y.o.  Admit date: 02/21/2020 Discharge date: 02/23/2020  Admission Diagnoses:  Discharge Diagnoses:  Active Problems:   Ischemic leg   Endotracheal tube present   Discharged Condition: critical  Hospital Course: This is a 59 year old male who presented to vascular clinic on 02-21-2020 with left leg rest pain.  He has a history of a left iliac stent placed in 2017.  He has been medically non-compliant.  ABI was 0.29.  He went to the Cath Lab later that day and underwent recanalization of his occluded left iliac system as well as kissing iliac stents.  After the procedure, he had a transient episode of slurred speech and confusion which completely resolved.  Shortly after that, he developed severe ischemic changes to his left leg and moderate ischemic changes to the right leg.  I took him to the operating room that night and performed open thrombectomy of bilateral iliac systems.  I also performed thrombectomy of the distal extremity on the left through a popliteal incision.  Fasciotomies were performed on the left leg.  He returned to the ICU intubated.  Throughout the course of the next several hours he regained Doppler signals in both legs and his legs were clearly viable.  He was then extubated.  I started him on argatroban because I did not have an obvious explanation of his stent occlusion and could not rule out the possibility of HIT.  A HIT antibody was sent but has not returned.  This morning, 02/23/2020 he developed confusion and right-sided hemiparesis.  Stroke work-up was performed which showed a large left MCA distribution stroke and an occluded left internal carotid artery.  It was felt that because of his recent surgery, he was not a candidate for TPA or clot retrieval.  Because of the large size of his stroke, transferred to [facility] for neurosurgical availability was recommended in case he developed  significant swelling and was at risk for herniation.  Consults: stroke team    Discharge Exam: Blood pressure (!) 154/75, pulse (!) 115, temperature (!) 100.6 F (38.1 C), resp. rate (!) 21, height '5\' 7"'  (1.702 m), weight 79.6 kg, SpO2 97 %. Right-sided hemiparesis palpable Doppler signalsSoft compartments  Disposition: Critical condition for transfer to [facility] for neurosurgical availability   Allergies as of 02/23/2020   No Known Allergies     Medication List    STOP taking these medications   albuterol 108 (90 Base) MCG/ACT inhaler Commonly known as: VENTOLIN HFA Replaced by: albuterol (2.5 MG/3ML) 0.083% nebulizer solution   amLODipine 5 MG tablet Commonly known as: NORVASC     TAKE these medications   acetaminophen 650 MG suppository Commonly known as: TYLENOL Place 1 suppository (650 mg total) rectally every 6 (six) hours as needed for mild pain (or Fever >/= 101).   albuterol (2.5 MG/3ML) 0.083% nebulizer solution Commonly known as: PROVENTIL Inhale 3 mLs into the lungs every 6 (six) hours as needed for wheezing or shortness of breath. Replaces: albuterol 108 (90 Base) MCG/ACT inhaler   alum & mag hydroxide-simeth 200-200-20 MG/5ML suspension Commonly known as: MAALOX/MYLANTA Take 15-30 mLs by mouth every 2 (two) hours as needed for indigestion.   amLODipine-benazepril 10-20 MG capsule Commonly known as: LOTREL Take 1 capsule by mouth daily.   aspirin EC 325 MG tablet Take 1 tablet (325 mg total) by mouth daily. What changed: Another medication with the same name was changed. Make sure  you understand how and when to take each.   aspirin 81 MG chewable tablet Chew 4 tablets (324 mg total) by mouth once for 1 dose. What changed:   how much to take  when to take this   atenolol 50 MG tablet Commonly known as: TENORMIN Take 1 tablet (50 mg total) by mouth daily. Start taking on: February 24, 2020   atorvastatin 80 MG tablet Commonly known as:  LIPITOR Take 1 tablet (80 mg total) by mouth daily. Start taking on: February 24, 2020 What changed:   medication strength  how much to take   benazepril 20 MG tablet Commonly known as: LOTENSIN Take 1 tablet (20 mg total) by mouth daily. Start taking on: February 24, 2020   chlorhexidine gluconate (MEDLINE KIT) 0.12 % solution Commonly known as: PERIDEX 15 mLs by Mouth Rinse route 2 (two) times daily.   clopidogrel 75 MG tablet Commonly known as: PLAVIX Take 1 tablet (75 mg total) by mouth daily.   docusate 50 MG/5ML liquid Commonly known as: COLACE Take 10 mLs (100 mg total) by mouth 2 (two) times daily.   docusate sodium 100 MG capsule Commonly known as: COLACE Take 1 capsule (100 mg total) by mouth daily. Start taking on: February 24, 2020   guaiFENesin-dextromethorphan 100-10 MG/5ML syrup Commonly known as: ROBITUSSIN DM Take 15 mLs by mouth every 4 (four) hours as needed for cough.   insulin aspart 100 UNIT/ML injection Commonly known as: novoLOG Inject 0-9 Units into the skin every 4 (four) hours.   lisinopril 20 MG tablet Commonly known as: ZESTRIL Take 20 mg by mouth daily.   nicotine 21 mg/24hr patch Commonly known as: NICODERM CQ - dosed in mg/24 hours Place 1 patch (21 mg total) onto the skin daily. Start taking on: February 24, 2020   ondansetron 4 MG/2ML Soln injection Commonly known as: ZOFRAN Inject 2 mLs (4 mg total) into the vein every 6 (six) hours as needed for nausea or vomiting.   ondansetron 4 MG/2ML Soln injection Commonly known as: ZOFRAN Inject 2 mLs (4 mg total) into the vein every 6 (six) hours as needed for nausea or vomiting.   polyethylene glycol 17 g packet Commonly known as: MIRALAX / GLYCOLAX Take 17 g by mouth daily. Start taking on: February 24, 2020        Signed: Annamarie Major 02/23/2020, 3:44 PM

## 2020-02-23 NOTE — Consult Note (Addendum)
NEUROLOGY CONSULTATION NOTE   Date of service: February 23, 2020 Patient Name: Jonathan [name] MRN:  [id number] DOB:  [birth year] Reason for consult: "Stroke code"  History of Present Illness  Jonathan [name] is a 59 y.o. male with PMH significant for DJD, HLD, HTN, Peripheral arterial disease who is admitted with acute critical limb ischemia 2/2 occlusion of the external iliac artery occlusion s/p BL iliac stenting on 8/6. He was taken back to the OR on 8/7 for acute left limb ischemia and found to have acute thrombosis of BL iliac stents s/p thrombectomy and endarterectomy of plaque in left groin and 4 compartment fasciotomies of left leg. He was noted to have a transient episode of aphasia after the procedure. He was placed on argatroban after the procedure and a HIT Panel was sent which is pending.  Today a stroke code was called for R hemiplegia and aphasia with a LKW of 0800 on 02/23/20. He was noted to be at his baseline at shift change per staff. Talkative and making sense. A STAT CTH demonstrated a large hypodensity concerning for a large territory infarct in the L MCA territory with an ASPECT of 4. CTA with L ICA occlusion noted at the bifurcation and remains occluded intracranially with minimal distal reconstitution.  TPA: Not eligible, he is Argatroban Thrombectomy: Given his ASPECT score of 4, he was not a candidate for thrombectomy. NIHSS 22 Baseline mRS: 1  1a Level of Conscious.: 0 1b LOC Questions: 2 1c LOC Commands:2  2 Best Gaze: 1 3 Visual: 1 4 Facial Palsy: 2 5a Motor Arm - left: 0 5b Motor Arm - Right: 3 6a Motor Leg - Left: 0 6b Motor Leg - Right: 4 7 Limb Ataxia: 0 8 Sensory: 2 9 Best Language: 2 10 Dysarthria: 2 11 Extinct. and Inatten.: 1 TOTAL: 22   ROS   Unable to obtain 2/2 significant aphasia. He does not appear to be in pain.  Past History   Past Medical History:  Diagnosis Date  . Aphasia S/P CVA   . DDD (degenerative disc disease),  cervical   . ED (erectile dysfunction)   . Hyperlipidemia   . Hypertension   . Peripheral arterial disease (HCC)   . Stroke [facility])    Past Surgical History:  Procedure Laterality Date  . PERIPHERAL VASCULAR CATHETERIZATION N/A 07/21/2015   Procedure: Abdominal Aortogram w/Lower Extremity;  Surgeon: Renford Dills, MD;  Location: [facility] INVASIVE CV LAB;  Service: Cardiovascular;  Laterality: N/A;  . PERIPHERAL VASCULAR CATHETERIZATION  07/21/2015   Procedure: Lower Extremity Intervention;  Surgeon: Renford Dills, MD;  Location: [facility] INVASIVE CV LAB;  Service: Cardiovascular;;   Family History  Problem Relation Age of Onset  . Emphysema Mother   . Heart attack Father   . Heart attack Paternal Grandmother    Social History   Socioeconomic History  . Marital status: Married    Spouse name: Not on file  . Number of children: Not on file  . Years of education: Not on file  . Highest education level: Not on file  Occupational History  . Not on file  Tobacco Use  . Smoking status: Current Every Day Smoker    Types: Cigarettes  . Smokeless tobacco: Never Used  Substance and Sexual Activity  . Alcohol use: Yes  . Drug use: Never  . Sexual activity: Not on file  Other Topics Concern  . Not on file  Social History Narrative  . Not on file  Social Determinants of Health   Financial Resource Strain:   . Difficulty of Paying Living Expenses:   Food Insecurity:   . Worried About Programme researcher, broadcasting/film/video in the Last Year:   . Barista in the Last Year:   Transportation Needs:   . Freight forwarder (Medical):   [name] Lack of Transportation (Non-Medical):   Physical Activity:   . Days of Exercise per Week:   . Minutes of Exercise per Session:   Stress:   . Feeling of Stress :   Social Connections:   . Frequency of Communication with Friends and Family:   . Frequency of Social Gatherings with Friends and Family:   . Attends Religious Services:   . Active Member of Clubs  or Organizations:   . Attends Banker Meetings:   [name] Marital Status:    No Known Allergies  Medications   Medications Prior to Admission  Medication Sig Dispense Refill Last Dose  . albuterol (VENTOLIN HFA) 108 (90 Base) MCG/ACT inhaler Inhale into the lungs.   02/20/2020 at Unknown time  . amLODipine (NORVASC) 5 MG tablet Take 5 mg by mouth every morning.   Past Week at Unknown time  . atenolol (TENORMIN) 50 MG tablet Take 50 mg by mouth daily.   Past Week at Unknown time  . atorvastatin (LIPITOR) 40 MG tablet Take 40 mg by mouth daily.   Past Week at Unknown time  . clopidogrel (PLAVIX) 75 MG tablet TAKE 1 TABLET BY MOUTH EVERY DAY (Patient taking differently: Take 75 mg by mouth daily. ) 90 tablet 3 Past Week at Unknown time  . lisinopril (ZESTRIL) 20 MG tablet Take 20 mg by mouth daily.    02/20/2020 at Unknown time  . amLODipine-benazepril (LOTREL) 10-20 MG capsule Take 1 capsule by mouth daily.  (Patient not taking: Reported on 02/22/2020)   Not Taking at Unknown time  . aspirin 81 MG chewable tablet Chew by mouth. (Patient not taking: Reported on 02/22/2020)   Not Taking at Unknown time  . aspirin EC 325 MG tablet Take 1 tablet (325 mg total) by mouth daily. (Patient not taking: Reported on 02/21/2020) 30 tablet 0      Vitals  Temp:  [99.1 F (37.3 C)-99.9 F (37.7 C)] 99.5 F (37.5 C) (08/08 0600) Pulse Rate:  [86-116] 95 (08/08 0600) Resp:  [14-21] 17 (08/08 0600) BP: (111-166)/(63-85) 157/75 (08/08 0600) SpO2:  [96 %-100 %] 99 % (08/08 0600) FiO2 (%):  [35 %] 35 % (08/07 1500)  Body mass index is 27.49 kg/m.  Physical Exam   General: Laying comfortably in bed; in no acute distress.  HENT: Normal oropharynx and mucosa. Normal external appearance of ears and nose. Neck: Supple, no pain or tenderness CV: No JVD. No peripheral edema. Pulmonary: Symmetric Chest rise. Normal respiratory effort. Abdomen: Soft to touch, non-tender Ext: No cyanosis, edema, or deformity   Skin: No rash. Normal palpation of skin.   Musculoskeletal: Normal digits and nails by inspection. No clubbing.  Neurologic Examination  Mental status/Cognition: awake alert, smiles at me. Speech/language: mumbling and says yeah Cranial nerves:   CN II Pupils equal and reactive to light, does not blink to threat on the R   CN III,IV,VI L gaze preference, no nystagmus, eyes do not cross midline   CN V Does not respond to pinprick in R face.   CN VII R facial droop   CN VIII Looks towards the direction of speech   CN  IX & X normal palatal elevation, no uvular deviation   CN XI    CN XII midline tongue protrusion   Motor:  Muscle bulk: normal, tone flaccid in RUE and RLE  RUE: 1/5 LUE: 5/5 LLE: 5/5 across the foot and knee. Held off on assessing hip 2/2 recent angioplasty. RLE: 0/5.  Reflexes:  Right Left Comments  Pectoralis      Biceps (C5/6)     Brachioradialis (C5/6) 0 0    Triceps (C6/7) 0 0    Patellar (L3/4) 0 0    Achilles (S1) 0 0    Hoffman      Plantar mute withdraws   Jaw jerk    Sensation:  Light touch Intact on left, dminished on R   Pin prick diminshed on R   Temperature    Vibration   Proprioception    Coordination/Complex Motor:  - Finger to Nose intact on left - Gait: not safe to assess.  Labs   Lab Results  Component Value Date   NA 138 02/23/2020   K 3.7 02/23/2020   CL 107 02/23/2020   CO2 24 02/23/2020   GLUCOSE 158 (H) 02/23/2020   BUN 18 02/23/2020   CREATININE 0.84 02/23/2020   CALCIUM 7.4 (L) 02/23/2020   ALBUMIN 3.2 (L) 02/22/2020   AST 25 02/22/2020   ALT 32 02/22/2020   ALKPHOS 37 (L) 02/22/2020   BILITOT 0.9 02/22/2020   GFRNONAA >60 02/23/2020   GFRAA >60 02/23/2020     Imaging and Diagnostic studies  CTH without contrast: Large acute demarcated left MCA vascular territory as described. ASPECTS is 4. No evidence of hemorrhagic conversion. Mild mass effect with 3 mm rightward midline shift. Stable background mild  generalized parenchymal atrophy and chronic small vessel ischemic disease. Superimposed small chronic infarcts within the left cerebral hemisphere.  CTA neck:  1. 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 2. There is at least moderate calcified plaque at the right carotid bifurcation. There is stenosis at the origin of the cervical right vertebral artery which is poorly quantified due to motion degradation and blooming artifact from calcified plaque at this site. Carotid artery duplex is recommended for further evaluation. Additionally, there is apparent 50-60% stenosis of the mid cervical right ICA. 3. Moderate/severe atherosclerotic narrowing within the Dom V1 right vertebral artery. 4. The left vertebral artery is developmentally diminutive, but patent throughout the neck.  CTA head:  1. The left internal carotid artery remains occluded intracranially throughout the siphon region. There is minimal reconstitution of flow within the supraclinoid left ICA. The M1 and M2 left middle cerebral arteries are patent, although with asymmetrically decreased opacification as compared to the right side. Additionally, there is a moderate to moderately severe focal stenosis within a superior division proximal M2 left MCA branch. 2. Additional intracranial atherosclerotic disease with multifocal stenoses, most notably as follows. 3. High-grade stenoses within the cavernous/paraclinoid right ICA. 4. Moderate to moderately severe focal stenosis within an inferior division mid M2 right MCA branch vessel. 5. Moderate stenosis within the V4 right vertebral artery. 6. High-grade stenosis within the P2 left PCA. 7. High-grade stenosis within the right PCA at the P2/P3 junction. 8. Multifocal high-grade stenoses within the A2 anterior cerebral arteries bilaterally.  CT perfusion head:  The perfusion software identifies an  89 mL core infarct within the left MCA vascular territory. The perfusion software identifies a 149 mL region of critically hypoperfused parenchyma  within the left MCA vascular territory. Reported mismatch volume 60 mL.   Impression   Timmothy SoursRaymond C Veilleux is a 59 y.o. male with PMH significant for DJD, HLD, HTN, Peripheral arterial disease who is admitted with acute critical limb ischemia 2/2 occlusion of the external iliac artery occlusion s/p BL iliac stenting on 8/6 and reocclusion s/p thrombectomy and endarterectomy who had acute onset R hemiplegia and aphasia with NIHSS of 22 found to have Large L MCA hypodensity on CTH concerning for acute ischemic stroke. Not a candidate for tPA 2/2 argatroban. Found to have L ICA occlusion at the bifurcation but not a candidate for thrombectomy 2/2 ASPECT of 4.  He needs Argatroban for the noted rethrombosis of the L iliac artery resulting in acute critical limb ischemia but is going to be very high risk for hemorrhagic conversion on Argatroban and will need to be closely watched in the ICU for potential need for hemicraniectomy. His HIT panel is pending.  I did discuss with his wife Harriett Sineancy in detail his stroke with language deficits and R sided weakness. Disussed his need for anticoagulation 2/2 recent thrombosis of the iliac artery stent but high  risk of ICH given his large infarct size. Discussed risk of swelling, cerebral edema and potential mass effect from the noted L MCA stroke and need [facility] Neuro ICU.  Recommendations  - I stopped argatroban and ordered Aspirin 325mg  daily and Plavix 75 mg daily. - Frequent Neuro checks Q1H - MRI Brain is pending - TTE - pending - Lipid panel - ordered - Statin - Atorvastatin 80mg  daily - A1C - ordered  - DVT ppx - SCD (hold off on Lovenox given pending HIT panel). - SBP goal - permissive hypertension first 24 h < 220/110. - Telemetry monitoring for arrythmia- ordered -  Swallow screen - ordered - Stroke education - ordered  - PT/OT/SLP consulted - [facility] for close monitoring and Hemicrani watch. - Sodium and Osmolality ordered for tomorrow AM - Repeat CTH without contrast tomorrow AM to assess for any edema/mass effect/midline shift - STAT CTH for worsening mentation or increase in NIHSS of 3 or more.  ______________________________________________________________________  Thank you for the opportunity to take part in the care of this patient. If you have any further questions, please contact the neurology consultation attending.  Signed,  Erick BlinksSalman Marchelle Rinella [facility] Pager Number [phone]

## 2020-02-23 NOTE — Progress Notes (Signed)
PCCM to [facility] transfer:  Patient with h/o CVA: PAD; HTN; and HLD who was admitted by vascular surgery Dr. Myra Gianotti on 8/6.  Has h/o severe PAD, prior h/o stents, presenting with rethrombosis and critical limb ischemia.  He had thrombectomy with restored blood flow.  He remained intubated to go back to OR but this was not necessary and so extubated and can transfer to hospitalist service.  [facility] will assume care on 8/9.  Georgana Curio, M.D.

## 2020-02-23 NOTE — H&P (Signed)
NAME:  Jonathan [name], MRN:  [id number], DOB:  Jan 13, 1961, LOS: 0 ADMISSION DATE:  02/23/2020, CONSULTATION DATE:  02/23/2020 REFERRING MD:  Dr. 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 Transferred to [facility] in case neuro status declined and needed neurosurgical support.  History of present illness   This is a 59 y/o male who presented with a medical history HTN, dyslipidemia, tobacco abuse,and significant peripheral vascular disease sent to [facility] from [facility] for an urgent due to leg pain with severe claudication at rest on 8/6.  He was diagnosed with occlusive disease of the bilateral lower extremities,  acute ischemia left lower extremity.  On admission 02/21/2020, he underwent:  Percutaneous transluminal angioplasty and stent placement bilateral common iliac arteries ([place] balloon), and angioplasty and stent placement bilateral external iliac arteries.  In the early morning of 8/7, patient started complaining of severe left leg pain.  Upon assessment he had no palpable pulses in his left leg was mottled.  He was on a heparin drip at the time.  He was taken back to the OR urgently by Dr. Trula Slade and underwent bilateral thrombectomy, left popliteal exposure and thrombectomy, left femoral endarterectomy and patch angioplasty as well as left 4-compartement fasciotomy. Argatroban was started with concern of maintaining vascular patency in a setting of rethrombosis while on heparin (and thus question of HIIT).  He was recovering in the ICU, extubated, when he acutely developed slurred speech, right sided weakness late morning 8/8. Stat angiography and head CT was obtained that demonstrated L MCA  stroke. Volume of stroke including penumbra is ~150cc. Neurology at [facility] concerned for potential expansion requiring NS assistance and transferred to [facility].  Past Medical History  HTN Tobacco abuse Dyslipidemia PAD  Significant Hospital Events   8/6-8/7--critical limb ischemia requiring surgical intervention 8/8--stroke in the face of anticoagulation with heparin, subsequently argatroban when rethrombosed.   Consults:  >>neurology >>vascular  Procedures:  8/7 a-line ([place])  Significant Diagnostic Tests:  8/8 CT head: Large acute demarcated left MCA vascular territory as described.ASPECTS is 4. No evidence of hemorrhagic conversion. Mild mass effect with 3 mm rightward midline shift. 8/8 CT angio head/neck: The cervical left ICA is occluded from its origin and remains occluded throughout the remainder of the neck.The left internal carotid artery remains occluded intracranially throughout the siphon region. There is minimal reconstitution of flow within the supraclinoid left ICA. The M1 and M2 left middle cerebral arteries are patent, although with asymmetrically decreased opacification as compared to the right side. Additionally, there is a moderate to moderately severe focal stenosis within a superior division proximal M2 left MCA branch.The perfusion software identifies an 89 mL core infarct within the left MCA vascular territory. The perfusion software identifies a 189m region of critically hypoperfused parenchyma within the left MCA vascular territory. Reported mismatch volume 60 mL.  Micro Data:  8/6>> COVID neg 8/8>>MRSA pending  Antimicrobials:  none   Interim history/subjective:  Patient stable in transport, see physical exam below  Objective   Blood pressure (!) 146/97, pulse (!) 123, temperature (!) 100.6 F (38.1 C), temperature source Axillary, resp. rate 18, SpO2 95 %.       No intake or output data in the 24  hours ending 02/23/20 1748 There were no  vitals filed for this visit.  Examination: General: NAD HENT: AT, [place], MMM,  Lungs: upper airway sounds, CTAB otherwise Cardiovascular: tachy, regular, no r/m/g Abdomen: umbilical hernia some 5 cm, reduces easily; SNDNT Extremities: wound vacs in place B groin, L calf, a-line left radial, no c/c/e Neuro: PERRL, EOM but L dominant gaze, hard to direct. CN V grossly intact; left facial droop, asymetry; R shoulder shrug, L neglible. Pt. Clearing secretions, can't swallow on request. Tongue deviates left. Expressive aphasia  LUE: grip, biceps, triceps 5/5 strength, full ROM LLE: 5/5, follows requests though less predictably RUE flacid, RLE moves toes, minimal proximal strength. 2 beat clonus r ankle.   GU: scrotal, penile edema, eccymosis; foley in place  VASC: 2+ radial pulses, 1-PT right, 1-/doppler PT left Resolved Hospital Problem list     Assessment & Plan:  59 year old with bilateral LE PVD, LLE ischemia s/p revascularization, now with L MCA stroke  Blood loss anemia: --Transfused 1 U PRBC for HGb 7.2.  --T & S here, H&H, transfuse if needed  Lower extremity thrombosis, s/p revascularization:  --will ask Dr. Trula Slade and colleagues to follow --possible HIT syndrome, panel still out, but seeming less likely --balance risk of hemorrhagic conversion of stroke against risks of stent thrombosis  Stroke: --neurology/stroke following --serial neuro exam --BP currently within normal parameters; will treat as needed --GCS >8; no need for intubation/airway protection.   F/E/N:  --serial labs, correct as needed --NPO for now, will get swallow study for patient safety     Best practice:  Diet: NPO, SLP to eval Pain/Anxiety/Delirium protocol (if indicated): n/a VAP protocol (if indicated): n/a DVT prophylaxis: mechanical for now; high risk for hemorrhagic conversion of stroke vs DVT and risk of stent rethrombosis GI prophylaxis: pepcid Glucose control: SSI if needed Mobility:  bed Code Status: full Family Communication: will update Disposition: ICU  Labs   CBC: Recent Labs  Lab 02/22/20 0210 02/22/20 1137 02/23/20 0124 02/23/20 0401  WBC 14.2* 12.4* 10.1 9.9  HGB 12.0* 9.3* 7.3* 7.2*  HCT 35.2* 26.6* 21.7* 21.3*  MCV 94.6 92.0 94.8 94.2  PLT 170 148* 136* 126*    Basic Metabolic Panel: Recent Labs  Lab 02/21/20 1618 02/22/20 0210 02/22/20 1137 02/23/20 0124  NA  --  138 139 138  K  --  3.5 5.0 3.7  CL  --  107 107 107  CO2  --  20* 24 24  GLUCOSE  --  246* 185* 158*  BUN '13 16 14 18  ' CREATININE 0.68 0.87 0.86 0.84  CALCIUM  --  7.9* 7.2* 7.4*   GFR: Estimated Creatinine Clearance: 95.8 mL/min (by C-G formula based on SCr of 0.84 mg/dL). Recent Labs  Lab 02/22/20 0210 02/22/20 0242 02/22/20 1137 02/23/20 0124 02/23/20 0401 02/23/20 0630  WBC 14.2*  --  12.4* 10.1 9.9  --   LATICACIDVEN  --  2.0*  --   --   --  1.4    Liver Function Tests: Recent Labs  Lab 02/22/20 0210  AST 25  ALT 32  ALKPHOS 37*  BILITOT 0.9  PROT 5.5*  ALBUMIN 3.2*   No results for input(s): LIPASE, AMYLASE in the last 168 hours. No results for input(s): AMMONIA in the last 168 hours.  ABG    Component Value Date/Time   PHART 7.30 (L) 02/22/2020 0730   PCO2ART 44 02/22/2020 0730   PO2ART 136 (H) 02/22/2020 0730   HCO3 21.6 02/22/2020  0730   ACIDBASEDEF 4.6 (H) 02/22/2020 0730   O2SAT 98.8 02/22/2020 0730     Coagulation Profile: Recent Labs  Lab 02/22/20 0210  INR 1.1    Cardiac Enzymes: No results for input(s): CKTOTAL, CKMB, CKMBINDEX, TROPONINI in the last 168 hours.  HbA1C: Hgb A1c MFr Bld  Date/Time Value Ref Range Status  02/23/2020 01:24 AM 6.2 (H) 4.8 - 5.6 % Final    Comment:    (NOTE) Pre diabetes:          5.7%-6.4%  Diabetes:              >6.4%  Glycemic control for   <7.0% adults with diabetes     CBG: Recent Labs  Lab 02/23/20 0017 02/23/20 0346 02/23/20 0727 02/23/20 1128 02/23/20 1553  GLUCAP 164*  136* 130* 138* 146*    Review of Systems:   n/a  Past Medical History  He,  has a past medical history of Aphasia S/P CVA, DDD (degenerative disc disease), cervical, ED (erectile dysfunction), Hyperlipidemia, Hypertension, Peripheral arterial disease ([place]), and Stroke ([place]).   Surgical History    Past Surgical History:  Procedure Laterality Date  . PERIPHERAL VASCULAR CATHETERIZATION N/A 07/21/2015   Procedure: Abdominal Aortogram w/Lower Extremity;  Surgeon: Katha Cabal, MD;  Location: [place] [facility];  Service: Cardiovascular;  Laterality: N/A;  . PERIPHERAL VASCULAR CATHETERIZATION  07/21/2015   Procedure: Lower Extremity Intervention;  Surgeon: Katha Cabal, MD;  Location: [place] [facility];  Service: Cardiovascular;;     Social History   reports that he has been smoking cigarettes. He has never used smokeless tobacco. He reports current alcohol use. He reports that he does not use drugs.   Family History   His family history includes Emphysema in his mother; Heart attack in his father and paternal grandmother.   Allergies No Known Allergies   Home Medications  Prior to Admission medications   Medication Sig Start Date End Date Taking? Authorizing Provider  acetaminophen (TYLENOL) 650 MG suppository Place 1 suppository (650 mg total) rectally every 6 (six) hours as needed for mild pain (or Fever >/= 101). 02/23/20   Serafina Mitchell, MD  albuterol (PROVENTIL) (2.5 MG/3ML) 0.083% nebulizer solution Inhale 3 mLs into the lungs every 6 (six) hours as needed for wheezing or shortness of breath. 02/23/20   Serafina Mitchell, MD  alum & mag hydroxide-simeth (MAALOX/MYLANTA) 200-200-20 MG/5ML suspension Take 15-30 mLs by mouth every 2 (two) hours as needed for indigestion. 02/23/20   Serafina Mitchell, MD  amLODipine-benazepril (LOTREL) 10-20 MG capsule Take 1 capsule by mouth daily.     [provider]  aspirin 81 MG chewable tablet Chew 4 tablets (324 mg total) by  mouth once for 1 dose. 02/23/20 02/23/20  Serafina Mitchell, MD  aspirin EC 325 MG tablet Take 1 tablet (325 mg total) by mouth daily. 09/17/19   Carrie Mew, MD  atenolol (TENORMIN) 50 MG tablet Take 1 tablet (50 mg total) by mouth daily. 02/24/20   Serafina Mitchell, MD  atorvastatin (LIPITOR) 80 MG tablet Take 1 tablet (80 mg total) by mouth daily. 02/24/20   Serafina Mitchell, MD  benazepril (LOTENSIN) 20 MG tablet Take 1 tablet (20 mg total) by mouth daily. 02/24/20   Serafina Mitchell, MD  chlorhexidine gluconate, MEDLINE KIT, (PERIDEX) 0.12 % solution 15 mLs by Mouth Rinse route 2 (two) times daily. 02/23/20   Serafina Mitchell, MD  clopidogrel (PLAVIX) 75 MG tablet  Take 1 tablet (75 mg total) by mouth daily. 02/23/20   Serafina Mitchell, MD  docusate (COLACE) 50 MG/5ML liquid Take 10 mLs (100 mg total) by mouth 2 (two) times daily. 02/23/20   Serafina Mitchell, MD  docusate sodium (COLACE) 100 MG capsule Take 1 capsule (100 mg total) by mouth daily. 02/24/20   Serafina Mitchell, MD  guaiFENesin-dextromethorphan (ROBITUSSIN DM) 100-10 MG/5ML syrup Take 15 mLs by mouth every 4 (four) hours as needed for cough. 02/23/20   Serafina Mitchell, MD  insulin aspart (NOVOLOG) 100 UNIT/ML injection Inject 0-9 Units into the skin every 4 (four) hours. 02/23/20   Serafina Mitchell, MD  lisinopril (ZESTRIL) 20 MG tablet Take 20 mg by mouth daily.  02/19/20 02/18/21  [provider]  nicotine (NICODERM CQ - DOSED IN MG/24 HOURS) 21 mg/24hr patch Place 1 patch (21 mg total) onto the skin daily. 02/24/20   Serafina Mitchell, MD  ondansetron (ZOFRAN) 4 MG/2ML SOLN injection Inject 2 mLs (4 mg total) into the vein every 6 (six) hours as needed for nausea or vomiting. 02/23/20   Serafina Mitchell, MD  ondansetron (ZOFRAN) 4 MG/2ML SOLN injection Inject 2 mLs (4 mg total) into the vein every 6 (six) hours as needed for nausea or vomiting. 02/23/20   Serafina Mitchell, MD  polyethylene glycol (MIRALAX / GLYCOLAX) 17 g packet Take 17 g by mouth  daily. 02/24/20   Serafina Mitchell, MD     Critical care time: 60 minutes     Bonna Gains MD, PhD 6:46 PM

## 2020-02-24 ENCOUNTER — Inpatient Hospital Stay (HOSPITAL_COMMUNITY): Payer: BC Managed Care – PPO

## 2020-02-24 ENCOUNTER — Encounter: Payer: Self-pay | Admitting: Vascular Surgery

## 2020-02-24 DIAGNOSIS — I6522 Occlusion and stenosis of left carotid artery: Secondary | ICD-10-CM

## 2020-02-24 DIAGNOSIS — I6389 Other cerebral infarction: Secondary | ICD-10-CM

## 2020-02-24 DIAGNOSIS — I63132 Cerebral infarction due to embolism of left carotid artery: Principal | ICD-10-CM

## 2020-02-24 DIAGNOSIS — I745 Embolism and thrombosis of iliac artery: Secondary | ICD-10-CM

## 2020-02-24 LAB — TYPE AND SCREEN
ABO/RH(D): B POS
Antibody Screen: NEGATIVE
Unit division: 0

## 2020-02-24 LAB — BPAM RBC
Blood Product Expiration Date: 202109072359
ISSUE DATE / TIME: 202108081351
Unit Type and Rh: 7300

## 2020-02-24 LAB — CBC
HCT: 28.3 % — ABNORMAL LOW (ref 39.0–52.0)
Hemoglobin: 9.4 g/dL — ABNORMAL LOW (ref 13.0–17.0)
MCH: 31.2 pg (ref 26.0–34.0)
MCHC: 33.2 g/dL (ref 30.0–36.0)
MCV: 94 fL (ref 80.0–100.0)
Platelets: 117 10*3/uL — ABNORMAL LOW (ref 150–400)
RBC: 3.01 MIL/uL — ABNORMAL LOW (ref 4.22–5.81)
RDW: 14.1 % (ref 11.5–15.5)
WBC: 10 10*3/uL (ref 4.0–10.5)
nRBC: 0 % (ref 0.0–0.2)

## 2020-02-24 LAB — LIPID PANEL
Cholesterol: 132 mg/dL (ref 0–200)
HDL: 35 mg/dL — ABNORMAL LOW (ref >40)
LDL Cholesterol: 71 mg/dL (ref 0–99)
Total CHOL/HDL Ratio: 3.8 RATIO
Triglycerides: 129 mg/dL (ref <150)
VLDL: 26 mg/dL (ref 0–40)

## 2020-02-24 LAB — ECHOCARDIOGRAM COMPLETE
Area-P 1/2: 7.16 cm2
Calc EF: 63.2 %
S' Lateral: 3.6 cm
Single Plane A2C EF: 45.7 %
Single Plane A4C EF: 73.4 %

## 2020-02-24 LAB — SODIUM
Sodium: 142 mmol/L (ref 135–145)
Sodium: 147 mmol/L — ABNORMAL HIGH (ref 135–145)
Sodium: 143 mmol/L (ref 135–145)

## 2020-02-24 LAB — HEPARIN INDUCED PLATELET AB (HIT ANTIBODY): Heparin Induced Plt Ab: 0.068 OD (ref 0.000–0.400)

## 2020-02-24 LAB — BASIC METABOLIC PANEL
Anion gap: 10 (ref 5–15)
BUN: 8 mg/dL (ref 6–20)
CO2: 23 mmol/L (ref 22–32)
Calcium: 8 mg/dL — ABNORMAL LOW (ref 8.9–10.3)
Chloride: 112 mmol/L — ABNORMAL HIGH (ref 98–111)
Creatinine, Ser: 0.66 mg/dL (ref 0.61–1.24)
GFR calc Af Amer: >60 mL/min (ref >60)
GFR calc non Af Amer: >60 mL/min (ref >60)
Glucose, Bld: 135 mg/dL — ABNORMAL HIGH (ref 70–99)
Potassium: 3.9 mmol/L (ref 3.5–5.1)
Sodium: 145 mmol/L (ref 135–145)

## 2020-02-24 LAB — APTT: aPTT: <20 seconds — ABNORMAL LOW (ref 24–36)

## 2020-02-24 LAB — HIV ANTIBODY (ROUTINE TESTING W REFLEX): HIV Screen 4th Generation wRfx: NONREACTIVE

## 2020-02-24 MED ORDER — PANTOPRAZOLE SODIUM 40 MG PO TBEC
40.0000 mg | DELAYED_RELEASE_TABLET | Freq: Every day | ORAL | Status: DC
  Administered 2020-02-25 – 2020-03-09 (×14): 40 mg via ORAL
  Filled 2020-02-24 (×14): qty 1

## 2020-02-24 MED ORDER — ORAL CARE MOUTH RINSE
15.0000 mL | Freq: Two times a day (BID) | OROMUCOSAL | Status: DC
  Administered 2020-02-24 – 2020-03-10 (×24): 15 mL via OROMUCOSAL

## 2020-02-24 MED ORDER — ASPIRIN 325 MG PO TABS
325.0000 mg | ORAL_TABLET | Freq: Every day | ORAL | Status: DC
  Administered 2020-02-25 – 2020-03-01 (×6): 325 mg via ORAL
  Filled 2020-02-24 (×6): qty 1

## 2020-02-24 MED ORDER — CHLORHEXIDINE GLUCONATE 0.12 % MT SOLN
15.0000 mL | Freq: Two times a day (BID) | OROMUCOSAL | Status: DC
  Administered 2020-02-24 – 2020-03-10 (×31): 15 mL via OROMUCOSAL
  Filled 2020-02-24 (×29): qty 15

## 2020-02-24 MED ORDER — POLYETHYLENE GLYCOL 3350 17 G PO PACK: 17.0000 g | PACK | Freq: Every day | ORAL | Status: DC

## 2020-02-24 MED ORDER — SODIUM CHLORIDE 0.9 % IV SOLN
INTRAVENOUS | Status: DC | PRN
  Administered 2020-02-24: 500 mL via INTRAVENOUS

## 2020-02-24 MED ORDER — PERFLUTREN LIPID MICROSPHERE
1.0000 mL | INTRAVENOUS | Status: DC | PRN
  Administered 2020-02-24: 2 mL via INTRAVENOUS
  Filled 2020-02-24: qty 10

## 2020-02-24 MED ORDER — SODIUM CHLORIDE 3 % IN NEBU
4.0000 mL | INHALATION_SOLUTION | Freq: Every day | RESPIRATORY_TRACT | Status: AC
  Administered 2020-02-24 – 2020-02-26 (×3): 4 mL via RESPIRATORY_TRACT
  Filled 2020-02-24 (×3): qty 4

## 2020-02-24 MED ORDER — NICOTINE 21 MG/24HR TD PT24
21.0000 mg | MEDICATED_PATCH | Freq: Every day | TRANSDERMAL | Status: DC
  Administered 2020-02-24 – 2020-03-09 (×15): 21 mg via TRANSDERMAL
  Filled 2020-02-24 (×15): qty 1

## 2020-02-24 MED ORDER — ATORVASTATIN CALCIUM 80 MG PO TABS
80.0000 mg | ORAL_TABLET | Freq: Every day | ORAL | Status: DC
  Administered 2020-02-25 – 2020-03-10 (×15): 80 mg via ORAL
  Filled 2020-02-24 (×15): qty 1

## 2020-02-24 MED ORDER — CLOPIDOGREL BISULFATE 75 MG PO TABS
75.0000 mg | ORAL_TABLET | Freq: Every day | ORAL | Status: DC
  Administered 2020-02-25 – 2020-03-10 (×15): 75 mg via ORAL
  Filled 2020-02-24 (×15): qty 1

## 2020-02-24 MED ORDER — ASPIRIN 300 MG RE SUPP
300.0000 mg | Freq: Every day | RECTAL | Status: DC
  Administered 2020-02-24: 300 mg via RECTAL

## 2020-02-24 MED ORDER — ENOXAPARIN SODIUM 40 MG/0.4ML ~~LOC~~ SOLN: 40.0000 mg | SUBCUTANEOUS | Status: DC

## 2020-02-24 MED ORDER — POTASSIUM CHLORIDE 10 MEQ/100ML IV SOLN: 10.0000 meq | INTRAVENOUS | Status: DC

## 2020-02-24 NOTE — Evaluation (Signed)
Clinical/Bedside Swallow Evaluation Patient Details  Name: Jonathan [name] MRN: [id number] Date of Birth: 01/01/1961  Today's Date: 02/24/2020 Time: SLP Start Time (ACUTE ONLY): 1131 SLP Stop Time (ACUTE ONLY): 1145 SLP Time Calculation (min) (ACUTE ONLY): 14 min  Past Medical History:  Past Medical History:  Diagnosis Date  . Aphasia S/P CVA   . DDD (degenerative disc disease), cervical   . ED (erectile dysfunction)   . Hyperlipidemia   . Hypertension   . Peripheral arterial disease (HCC)   . Stroke [facility])    Past Surgical History:  Past Surgical History:  Procedure Laterality Date  . FEMORAL-FEMORAL BYPASS GRAFT Bilateral 02/22/2020   Procedure: bilateral ileofemoral thrombectomy, left popliteal exposure and thrombectomy, four compartment fasciotomy, left femoral endarterectomy with patch angioplasty;  Surgeon: Nada Libman, MD;  Location: [facility] ORS;  Service: Vascular;  Laterality: Bilateral;  . LOWER EXTREMITY ANGIOGRAPHY Left 02/21/2020   Procedure: LOWER EXTREMITY ANGIOGRAPHY;  Surgeon: Renford Dills, MD;  Location: [facility];  Service: Cardiovascular;  Laterality: Left;  . PERIPHERAL VASCULAR CATHETERIZATION N/A 07/21/2015   Procedure: Abdominal Aortogram w/Lower Extremity;  Surgeon: Renford Dills, MD;  Location: [facility];  Service: Cardiovascular;  Laterality: N/A;  . PERIPHERAL VASCULAR CATHETERIZATION  07/21/2015   Procedure: Lower Extremity Intervention;  Surgeon: Renford Dills, MD;  Location: [facility];  Service: Cardiovascular;;   HPI:  59 y.o. male with a history of hypertension, hyperlipidemia, CVA, severe PAD  who presented with acute critical limb ischemia secondary to occlusion of the iliac artery who underwent stenting on 8/6 at [facility].  He was taken back to the OR on 8/7 found to have acute thrombosis of bilateral iliac stents underwent thrombectomy and endarterectomy;  fasciotomies of the left leg. Extubated 8/8. Developed  right hemiplegia and aphasia morning of 8/8. CTH: large territory infarct left MCA. Pt transferred to [facility] same day.     Assessment / Plan / Recommendation Clinical Impression  Pt presents with s/s of a neurogenic dysphagia with sensorimotor deficits and concerns for aspiration at bedside.  He has difficulty following commands due to aphasia.  There is sensory loss on the right side of oral cavity; poor recognition of material spilling from right side.  Pt verbalized continuously with perseveratory output - voice was wet and expirations were congested.  There was no coughing associated with PO intake, but pt is at high risk for silent aspiration. Recommend proceeding with MBS today to determine nature of dysphagia. D/W RN and contacted RD in the event cortrak is needed this afternoon.  SLP Visit Diagnosis: Dysphagia, oropharyngeal phase (R13.12)    Aspiration Risk    tba   Diet Recommendation   Pending results of MBS         Swallow Study   General HPI: 59 y.o. male with a history of hypertension, hyperlipidemia, CVA, severe PAD  who presented with acute critical limb ischemia secondary to occlusion of the iliac artery who underwent stenting on 8/6 at [facility]  He was taken back to the OR on 8/7 found to have acute thrombosis of bilateral iliac stents underwent thrombectomy and endarterectomy;  fasciotomies of the left leg. Extubated 8/8. Developed right hemiplegia and aphasia morning of 8/8. CTH: large territory infarct left MCA. Pt transferred to [facility] same day.   Type of Study: Bedside Swallow Evaluation Previous Swallow Assessment: no Diet Prior to this Study: NPO Temperature Spikes Noted: No Respiratory Status: Nasal cannula History of Recent Intubation: Yes  Length of Intubations (days):  (for procedure) Behavior/Cognition: Alert;Impulsive Oral Cavity Assessment: Within Functional Limits Oral Care Completed by SLP: No Oral Cavity - Dentition: Missing dentition Self-Feeding Abilities:  Needs assist Patient Positioning: Upright in bed Baseline Vocal Quality: Wet Volitional Cough: Cognitively unable to elicit Volitional Swallow: Unable to elicit    Oral/Motor/Sensory Function Overall Oral Motor/Sensory Function: Mild impairment Facial Symmetry: Abnormal symmetry right;Suspected CN VII (facial) dysfunction Facial Sensation: Reduced right   Ice Chips Ice chips: Within functional limits   Thin Liquid Thin Liquid: Impaired Presentation: Cup Oral Phase Impairments: Reduced labial seal Oral Phase Functional Implications: Right anterior spillage Pharyngeal  Phase Impairments: Suspected delayed Swallow;Wet Vocal Quality    Nectar Thick Nectar Thick Liquid: Not tested   Honey Thick Honey Thick Liquid: Not tested   Puree Puree: Within functional limits Presentation: Spoon Oral Phase Functional Implications: Right anterior spillage   Solid     Solid: Not tested      Blenda Mounts Laurice 02/24/2020,12:10 PM  Marchelle Folks L. Samson Frederic, MA CCC/SLP Acute Rehabilitation Services Office number [phone] Pager [phone]

## 2020-02-24 NOTE — Progress Notes (Signed)
Pt pulled off L groin site wound vac.  Incision w/ no drainage.  Dr. Darrick Penna notified and ordered to leave wound vac off.

## 2020-02-24 NOTE — Progress Notes (Signed)
Called by nurse.  PT pulled off one provena from groin will leave off for now.  Dr Myra Gianotti to reval in am  Fabienne Bruns, MD [facility] Office: [phone]

## 2020-02-24 NOTE — Evaluation (Addendum)
Speech Language Pathology Evaluation Patient Details Name: Jonathan [name] MRN: [id number] DOB: 07-May-1961 Today's Date: 02/24/2020 Time: 4497-5300 SLP Time Calculation (min) (ACUTE ONLY): 15 min  Problem List:  Patient Active Problem List   Diagnosis Date Noted  . 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 Endotracheal tube present   . Tobacco use 02/21/2020  . Ischemic leg 02/21/2020  . Aphasia S/P CVA 09/29/2019  . History of stroke 09/29/2019  . Hypertension, essential 09/29/2019  . PAD (peripheral artery disease) (HCC) 10/25/2016  . DDD (degenerative disc disease), cervical 01/07/2016  . Neck pain 01/07/2016  . Hyperlipidemia, unspecified 05/23/2013  . ED (erectile dysfunction) 11/13/2012   Past Medical History:  Past Medical History:  Diagnosis Date  . Aphasia S/P CVA   . DDD (degenerative disc disease), cervical   . ED (erectile dysfunction)   . Hyperlipidemia   . Hypertension   . Peripheral arterial disease (HCC)   . Stroke [facility])    Past Surgical History:  Past Surgical History:  Procedure Laterality Date  . FEMORAL-FEMORAL BYPASS GRAFT Bilateral 02/22/2020   Procedure: bilateral ileofemoral thrombectomy, left popliteal exposure and thrombectomy, four compartment fasciotomy, left femoral endarterectomy with patch angioplasty;  Surgeon: Jonathan Libman, MD;  Location: [facility] ORS;  Service: Vascular;  Laterality: Bilateral;  . LOWER EXTREMITY ANGIOGRAPHY Left 02/21/2020   Procedure: LOWER EXTREMITY ANGIOGRAPHY;  Surgeon: Jonathan Dills, MD;  Location: [facility];  Service: Cardiovascular;  Laterality: Left;  . PERIPHERAL VASCULAR CATHETERIZATION N/A 07/21/2015   Procedure: Abdominal Aortogram w/Lower Extremity;  Surgeon: Jonathan Dills, MD;  Location: [facility];  Service: Cardiovascular;  Laterality: N/A;  . PERIPHERAL VASCULAR CATHETERIZATION  07/21/2015   Procedure: Lower Extremity Intervention;  Surgeon: Jonathan Dills, MD;  Location: [facility];  Service: Cardiovascular;;   HPI:  59 y.o. male with a history of hypertension, hyperlipidemia, CVA, severe PAD  who presented with acute critical limb ischemia secondary to occlusion of the iliac artery who underwent stenting on 8/6 at [facility].  He was taken back to the OR on 8/7 found to have acute thrombosis of bilateral iliac stents underwent thrombectomy and endarterectomy;  fasciotomies of the left leg. Extubated 8/8. Developed right hemiplegia and aphasia morning of 8/8. CTH: large territory infarct left MCA. Pt transferred to [facility] same day. Notes indicate there may have been a mild baseline aphasia, but degree unknown.     Assessment / Plan / Recommendation Clinical Impression  Pt presents with severe receptive and expressive aphasia. Expression is marked my stereotypies like "I want a.Marland KitchenMarland KitchenI want a...." followed by expletives and reasonable frustration.  He was able to inhibit perseveratory responses on one occasion, when asked his name he stated "39." (He is 59.) Pt had difficulty following commands excluding the direction to close his eyes.  Yes/ no reliability was poor during verbal responses (answered "yes" to all questions with 50% accuracy), however perseverations may have interfered with responses.  He was unable to discriminate between two familiar objects (0/3 opportunities).  He was unable to repeat.  Recommend intensive speech therapy; SLP will f/u to determine if any augmentative communication will help Jonathan [name] express his needs.  He demonstrates awareness of difficulty but thus far is unable to self-correct.      SLP Assessment  SLP Recommendation/Assessment: Patient needs continued Speech Lanaguage Pathology Services SLP Visit Diagnosis: Aphasia (R47.01)    Follow Up Recommendations  Inpatient Rehab    Frequency  and Duration min 3x week  2 weeks      SLP Evaluation Cognition  Overall Cognitive Status: Impaired/Different from  baseline Arousal/Alertness: Awake/alert Orientation Level: Oriented to person Attention: Focused Focused Attention: Appears intact       Comprehension  Auditory Comprehension Overall Auditory Comprehension: Impaired Yes/No Questions: Impaired Basic Biographical Questions: 0-25% accurate Commands: Impaired One Step Basic Commands: 0-24% accurate Conversation: Simple EffectiveTechniques: Extra processing time;Pausing Visual Recognition/Discrimination Discrimination: Exceptions to [facility] Common Objects: Unable to indentify Reading Comprehension Reading Status: Not tested    Expression Expression Primary Mode of Expression: Verbal Verbal Expression Overall Verbal Expression: Impaired Automatic Speech:  (unable) Level of Generative/Spontaneous Verbalization: Phrase Repetition: Impaired Level of Impairment: Word level Naming: Impairment Responsive: 0-25% accurate Confrontation: Impaired Common Objects: Unable to indentify Convergent: 0-24% accurate Verbal Errors: Semantic paraphasias;Perseveration;Aware of errors Written Expression Dominant Hand: Right   Oral / Motor  Oral Motor/Sensory Function Overall Oral Motor/Sensory Function: Mild impairment Facial Symmetry: Abnormal symmetry right;Suspected CN VII (facial) dysfunction Facial Sensation: Reduced right Motor Speech Overall Motor Speech: Appears within functional limits for tasks assessed Phonation: Normal Resonance: Within functional limits Articulation: Within functional limitis Intelligibility: Intelligible   GO                   Jonathan [name] L. Jonathan Frederic, MA CCC/SLP Acute Rehabilitation Services Office number [phone] Pager [phone]  Jonathan [name] Jonathan [name] 02/24/2020, 12:23 PM

## 2020-02-24 NOTE — Progress Notes (Signed)
  Echocardiogram 2D Echocardiogram with defintiy has been performed.  Leta Jungling M 02/24/2020, 9:46 AM

## 2020-02-24 NOTE — Progress Notes (Addendum)
NAME:  Jonathan [name], MRN:  [id number], DOB:  1961/07/03, LOS: 1 ADMISSION DATE:  02/23/2020, CONSULTATION DATE:  02/23/2020 REFERRING MD:  Dr. Myra Gianotti , CHIEF COMPLAINT:  L MCA stroke, vasculopathy  Brief History   Jonathan [name] is a 59 year old male with past medical history of hypertension, hyperlipidemia, tobacco abuse, significant PAD, who was admitted to [facility] for acute ischemia of the left lower extremity status post percutaneous transluminal angioplasty and stent placement bilateral common iliac arteries and external iliac arteries on 02/21/2020.  On 02/22/2020, he was found to have acute thrombosis of bilateral iliac stents which he underwent bilateral thrombectomy, left popliteal exposure and thrombectomy, left femoral endarterectomy and patch angioplasty as well as left fasciotomy.  Argatroban was started for concerning of heparin-induced thrombocytopenia.  On 02/23/2020, patient developed slurred speech, right-sided weakness.  CT head shows left MCA stroke.  Patient was transferred to [facility] for further care   Past Medical History  Hypertension Tobacco abuse Hyperlipidemia PAD  Significant Hospital Events   8/6-8/7--critical limb ischemia requiring surgical intervention 8/8--stroke in the face of anticoagulation with heparin, subsequently argatroban when rethrombosed.   Consults:  Neurology Vascular surgery  Procedures:  02/21/2020 angioplasty and stents in iliac arteries 02/22/2020 thrombectomy and endarterectomy and fasciotomy  Significant Diagnostic Tests:  8/8 CT head: Large acute demarcated left MCA vascular territory as described.ASPECTS is 4. No evidence of hemorrhagic conversion. Mild mass effect with 3 mm rightward midline shift. 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 There is minimal reconstitution of flow within the supraclinoid left ICA. The M1 and M2 left middle cerebral arteries are patent, although with asymmetrically decreased opacification as compared to the right side. Additionally, there is a moderate to moderately severe focal stenosis within a superior division proximal M2 left MCA branch.The perfusion software identifies an 89 mL core infarct within the left MCA vascular territory. The perfusion software identifies a region of critically hypoperfused parenchyma within the left MCA vascular territory. Reported mismatch volume 60 mL  Micro Data:  8/6>> COVID neg 8/8>>MRSA negative  Antimicrobials:  None  Interim history/subjective:  Patient is seen at bedside.  Unable to obtain any history due to profound expressive aphasia.  He appears in no acute respiratory distress.  Objective   Blood pressure (!) 151/80, pulse 93, temperature 99.2 F (37.3 C), temperature source Oral, resp. rate (!) 21, SpO2 95 %.        Intake/Output Summary (Last 24 hours) at 02/24/2020 0803 Last data filed at 02/24/2020 0600 Gross per 24 hour  Intake 1235.08 ml  Output 1100 ml  Net 135.08 ml   There were no vitals filed for this visit.  Examination: Physical Exam Constitutional:      General: He is not in acute distress.    Comments: Expressive aphasia  HENT:     Head: Normocephalic.  Eyes:     General: No scleral icterus.       Right eye: No discharge.        Left eye: No discharge.     Extraocular Movements: Extraocular movements intact.     Conjunctiva/sclera: Conjunctivae normal.  Cardiovascular:     Rate and Rhythm: Normal rate and regular rhythm.     Heart sounds: No murmur heard.   Pulmonary:     Effort: Pulmonary effort is normal. No respiratory distress.  Comments: Coarse breath sounds Abdominal:     General: Bowel sounds are normal.  Musculoskeletal:     Cervical back: Normal range of motion.     Right lower leg: No  edema.     Left lower leg: No edema.  Skin:    General: Skin is warm.     Coloration: Skin is not jaundiced.  Neurological:     Mental Status: He is alert.     Comments: Profound expressive aphasia Patient is able to follow commands Right upper extremity: Flaccid Right lower extremities: Able to raise leg against gravity Left lower extremity: Able to raise leg against gravity, seems to be a little bit weaker compared to the right     Resolved Hospital Problem list     Assessment & Plan:  Left MCA stroke Acute lower leg ischemia status post angioplasty and stent placement Blood loss anemia  Plan:  Left ICA stroke Patient developed slurred speech and right-sided weakness on 8/8.  CT head showed large acute left MCA infarct with no acute hemorrhage, 3 mm rightward midline shift.  CTA shows occluded left ICA, moderately severe focal stenosis of proximal M2 left MCA branch, and high-grade stenosis of PCA.  Neurology is on board.  After speaking with Dr. Roda Shutters, this is likely an ischemic stroke secondary to hypotension following the procedure.  Patient had history of left MCA infarct in March 2021 and the hypotension has exacerbated the carotid stenosis. -Appreciate neurology recommendation -Frequent neurochecks -Pending MRI and echocardiogram -Continue aspirin 325 mg p.o. -Hypertonic saline IV 75 cc/h for cerebral edema -PT OT and speech   Acute lower leg ischemia status post angioplasty and stent placement Patient underwent thrombectomy and endarterectomy with fasciotomy.  There is a concern for heparin-induced thrombocytopenia even though the platelet drops is not significant.  Pending HIT panel.  Argatroban was held due to risk of hemorrhagic stroke conversion. -Pending HIT panel -Mechanical DVT prophylaxis   Blood loss anemia Patient received 1 unit of packed RBC yesterday.  Current hemoglobin of 7.4 -CBC daily -Transfuse if less than 7  Best practice:  Diet: NPO, SLP to  eval Pain/Anxiety/Delirium protocol (if indicated): n/a VAP protocol (if indicated): n/a DVT prophylaxis: mechanical for now; high risk for hemorrhagic conversion of stroke vs DVT and risk of stent rethrombosis GI prophylaxis: pepcid Glucose control: SSI if needed Mobility: bed Code Status: full Family Communication: will update Disposition: ICU   Critical care time: 20 min     Doran Stabler, DO Internal Medicine Residency My pager: [phone]

## 2020-02-24 NOTE — Progress Notes (Signed)
Pt having increased work of breathing and increased rhonchi within the past hour.  Pt is restless and states it's hard to catch his breath.  A few sips of water given 2 hours ago.  Joneen Roach NP called to bedside.  Chest xray and hypertonic nebulizer ordered.   Will continue to monitor.

## 2020-02-24 NOTE — Progress Notes (Signed)
° ° °  Subjective  - POD #3  Remains confused   Physical Exam:  Doppler bilateral At signals, triphasic Groins with provena intact Bilateral calfs are soft without evidence of compartment syndrome      Assessment/Plan:  POD #3  Still with significant neuro issues Acute blood loss anemia:  Hb pending for today.  Received blood yesterday PAD:  Argatroban off due to stroke and risk for hemorrhagic conversion.  He is now on 325 aspirin.  I would at least like to add Plavix when cleared by neurology.  Wells Jonathan [name] 02/24/2020 8:29 AM --  Vitals:   02/24/20 0500 02/24/20 0600  BP: (!) 153/78 (!) 151/80  Pulse: 89 93  Resp: (!) 23 (!) 21  Temp:    SpO2: 98% 95%    Intake/Output Summary (Last 24 hours) at 02/24/2020 0829 Last data filed at 02/24/2020 0600 Gross per 24 hour  Intake 1235.08 ml  Output 1100 ml  Net 135.08 ml     Laboratory CBC    Component Value Date/Time   WBC 10.1 02/23/2020 1741   HGB 7.4 (L) 02/23/2020 1741   HGB 16.8 08/14/2012 1008   HCT 22.3 (L) 02/23/2020 1741   HCT 49.0 08/14/2012 1008   PLT PLATELET CLUMPS NOTED ON SMEAR, UNABLE TO ESTIMATE 02/23/2020 1741   PLT 291 08/14/2012 1008    BMET    Component Value Date/Time   NA 142 02/24/2020 0346   NA 138 08/14/2012 1008   K 3.3 (L) 02/23/2020 1741   K 3.9 08/14/2012 1008   CL 105 02/23/2020 1741   CL 103 08/14/2012 1008   CO2 24 02/23/2020 1741   CO2 27 08/14/2012 1008   GLUCOSE 159 (H) 02/23/2020 1741   GLUCOSE 84 08/14/2012 1008   BUN 11 02/23/2020 1741   BUN 10 08/14/2012 1008   CREATININE 0.68 02/23/2020 1741   CREATININE 0.70 08/14/2012 1008   CALCIUM 7.9 (L) 02/23/2020 1741   CALCIUM 9.3 08/14/2012 1008   GFRNONAA >60 02/23/2020 1741   GFRNONAA >60 08/14/2012 1008   GFRAA >60 02/23/2020 1741   GFRAA >60 08/14/2012 1008    COAG Lab Results  Component Value Date   INR 1.1 02/22/2020   INR 0.9 09/17/2019   No results found for: PTT  Antibiotics Anti-infectives (From  admission, onward)   None       V. Charlena Cross, M.D., [facility] [place] Office: [phone] Pager:  [phone]

## 2020-02-24 NOTE — Progress Notes (Signed)
STROKE TEAM PROGRESS NOTE   SUBJECTIVE (INTERVAL HISTORY) His RN is at the bedside.  Overall his condition is stable. Pt lying in bed, awake alert, global aphasia with word salad. Still has right facial droop, right UE flaccid but some against gravity on the RLE. Left fasciotomy drain in place. Scrotum significant ecchymosis and swelling. On 3% saline and ASA. Has not passed swallowing and likely need cortrak.   OBJECTIVE Temp:  [98.7 F (37.1 C)-100.6 F (38.1 C)] 98.7 F (37.1 C) (08/09 0800) Pulse Rate:  [89-126] 93 (08/09 0600) Cardiac Rhythm: Normal sinus rhythm (08/08 2000) Resp:  [18-32] 21 (08/09 0600) BP: (120-171)/(65-99) 151/80 (08/09 0600) SpO2:  [91 %-99 %] 95 % (08/09 0600) Arterial Line BP: (94-166)/(55-149) 152/127 (08/09 0600)  Recent Labs  Lab 02/23/20 0017 02/23/20 0346 02/23/20 0727 02/23/20 1128 02/23/20 1553  GLUCAP 164* 136* 130* 138* 146*   Recent Labs  Lab 02/22/20 0210 02/22/20 0210 02/22/20 1137 02/22/20 1137 02/23/20 0124 02/23/20 1741 02/23/20 2113 02/24/20 0346 02/24/20 0747  NA 138   < > 139   < > 138 139 139 142 145  K 3.5  --  5.0  --  3.7 3.3*  --   --  3.9  CL 107  --  107  --  107 105  --   --  112*  CO2 20*  --  24  --  24 24  --   --  23  GLUCOSE 246*  --  185*  --  158* 159*  --   --  135*  BUN 16  --  14  --  18 11  --   --  8  CREATININE 0.87  --  0.86  --  0.84 0.68  --   --  0.66  CALCIUM 7.9*   < > 7.2*   < > 7.4* 7.9*  --   --  8.0*  MG  --   --   --   --   --  1.9  --   --   --   PHOS  --   --   --   --   --  1.9*  --   --   --    < > = values in this interval not displayed.   Recent Labs  Lab 02/22/20 0210 02/23/20 1741  AST 25 203*  ALT 32 46*  ALKPHOS 37* 34*  BILITOT 0.9 0.6  PROT 5.5* 4.6*  ALBUMIN 3.2* 2.3*   Recent Labs  Lab 02/22/20 0210 02/22/20 1137 02/23/20 0124 02/23/20 0401 02/23/20 1741  WBC 14.2* 12.4* 10.1 9.9 10.1  HGB 12.0* 9.3* 7.3* 7.2* 7.4*  HCT 35.2* 26.6* 21.7* 21.3* 22.3*  MCV  94.6 92.0 94.8 94.2 93.3  PLT 170 148* 136* 126* PLATELET CLUMPS NOTED ON SMEAR, UNABLE TO ESTIMATE   No results for input(s): CKTOTAL, CKMB, CKMBINDEX, TROPONINI in the last 168 hours. Recent Labs    02/22/20 0210  LABPROT 13.5  INR 1.1   Recent Labs    02/22/20 1400  COLORURINE AMBER*  LABSPEC 1.032*  PHURINE 5.0  GLUCOSEU NEGATIVE  HGBUR MODERATE*  BILIRUBINUR NEGATIVE  KETONESUR NEGATIVE  PROTEINUR 30*  NITRITE NEGATIVE  LEUKOCYTESUR TRACE*       Component Value Date/Time   CHOL 132 02/24/2020 0748   TRIG 129 02/24/2020 0748   HDL 35 (L) 02/24/2020 0748   CHOLHDL 3.8 02/24/2020 0748   VLDL 26 02/24/2020 0748   LDLCALC 71 02/24/2020 0748  Lab Results  Component Value Date   HGBA1C 6.2 (H) 02/23/2020   No results found for: LABOPIA, COCAINSCRNUR, LABBENZ, AMPHETMU, THCU, LABBARB  No results for input(s): ETH in the last 168 hours.  I have personally reviewed the radiological images below and agree with the radiology interpretations.  CT HEAD WO CONTRAST  Result Date: 02/24/2020 CLINICAL DATA:  Stroke follow-up EXAM: CT HEAD WITHOUT CONTRAST TECHNIQUE: Contiguous axial images were obtained from the base of the skull through the vertex without intravenous contrast. COMPARISON:  02/23/2020 head CT. FINDINGS: Brain: Sequela of evolving left MCA territory infarct. 3-4 mm rightward midline shift is unchanged. No intracranial hemorrhage. No mass lesion. No ventriculomegaly or extra-axial fluid collection. Vascular: No hyperdense vessel. Bilateral skull base atherosclerotic calcifications. Skull: Negative for fracture or focal lesion. Sinuses/Orbits: Normal orbits. Soft tissue impacted left maxillary molar, unchanged. Mild right sphenoid sinus mucosal thickening. No mastoid effusion. Other: None. IMPRESSION: Evolving left MCA territory infarct with unchanged 3-4 mm rightward midline shift. No intracranial hemorrhage. Electronically Signed   By: Stana Bunting M.D.   On:  02/24/2020 09:29   CT HEAD WO CONTRAST  Result Date: 02/23/2020 CLINICAL DATA:  Stroke follow-up EXAM: CT HEAD WITHOUT CONTRAST TECHNIQUE: Contiguous axial images were obtained from the base of the skull through the vertex without intravenous contrast. COMPARISON:  Head CT 02/21/2020 FINDINGS: Brain: Large area of cytotoxic edema throughout the left MCA territory. No acute hemorrhage. 3 mm rightward midline shift. Vascular: Atherosclerotic calcification of the internal carotid arteries at the skull base. Skull: Normal. Negative for fracture or focal lesion. Sinuses/Orbits: No acute finding. Other: None. IMPRESSION: 1. Slightly increased cytotoxic edema within the left MCA territory. 2. No acute hemorrhage. 3. 3 mm rightward midline shift. Electronically Signed   By: Deatra Robinson M.D.   On: 02/23/2020 22:00   US Carotid Bilateral  Result Date: 02/23/2020 CLINICAL DATA:  Slurred speech.  Carotid stenosis. EXAM: BILATERAL CAROTID DUPLEX ULTRASOUND TECHNIQUE: Wallace Cullens scale imaging, color Doppler and duplex ultrasound were performed of bilateral carotid and vertebral arteries in the neck. COMPARISON:  CTA neck 02/23/2020 FINDINGS: Criteria: Quantification of carotid stenosis is based on velocity parameters that correlate the residual internal carotid diameter with NASCET-based stenosis levels, using the diameter of the distal internal carotid lumen as the denominator for stenosis measurement. The following velocity measurements were obtained: RIGHT ICA: 117/63 cm/sec CCA: 130/38 cm/sec SYSTOLIC ICA/CCA RATIO:  0.9 ECA: 220 cm/sec LEFT ICA: Occluded CCA: 75/10 cm/sec ECA: 195 cm/sec RIGHT CAROTID ARTERY: Mild atherosclerotic disease in the right common carotid artery. Heterogeneous plaque at the right carotid bulb. External carotid artery is patent with normal waveform. Atherosclerotic plaque in the proximal internal carotid artery. Normal waveforms and velocities in the internal carotid artery. RIGHT VERTEBRAL  ARTERY: Antegrade flow and normal waveform in the right vertebral artery. LEFT CAROTID ARTERY: Left common carotid artery is patent with intimal thickening. Left carotid artery is patent. External carotid artery is patent with normal waveform. 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 No flow in the mid or distal left internal carotid artery. LEFT VERTEBRAL ARTERY: Antegrade flow and normal waveform in the left vertebral artery. IMPRESSION: 1. 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 Findings correspond with recent CTA findings. 2. Atherosclerotic plaque involving the carotid arteries. Estimated degree of stenosis in the right internal carotid artery is less than 50%. 3. Patent vertebral arteries with antegrade flow. Electronically Signed   By: Richarda Overlie M.D.   On: 02/23/2020  14:42   PERIPHERAL VASCULAR CATHETERIZATION  Result Date: 02/21/2020 See op note  CT CEREBRAL PERFUSION W CONTRAST  Result Date: 02/23/2020 CLINICAL DATA:  Neuro deficit, acute, stroke suspected. EXAM: CT ANGIOGRAPHY HEAD AND NECK CT PERFUSION BRAIN TECHNIQUE: Multidetector CT imaging of the head and neck was performed using the standard protocol during bolus administration of intravenous contrast. Multiplanar CT image reconstructions and MIPs were obtained to evaluate the vascular anatomy. Carotid stenosis measurements (when applicable) are obtained utilizing NASCET criteria, using the distal internal carotid diameter as the denominator. Multiphase CT imaging of the brain was performed following IV bolus contrast injection. Subsequent parametric perfusion maps were calculated using RAPID software. CONTRAST:  OMNIPAQUE IOHEXOL 350 MG/ML SOLN COMPARISON:  Noncontrast head CT performed earlier the same day. FINDINGS: CTA NECK FINDINGS Aortic arch: The left vertebral artery arises directly from the aortic arch. Atherosclerotic plaque within the visualized aortic arch and proximal major branch vessels of  the neck. No hemodynamically significant innominate or proximal subclavian artery stenosis. Right carotid system: The CCA and ICA are patent within the neck. Atherosclerotic plaque within these vessels. Most notably there is at least moderate calcified plaque within the carotid bifurcation. Exact quantification of stenosis within the proximal right ICA is difficult due to motion artifact and blooming artifact of calcified plaque at this site. Additionally, there is 50-60% stenosis of the mid cervical ICA. Left carotid system: The CCA is patent to the bifurcation. 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 Vertebral arteries: The dominant right vertebral artery is patent throughout the neck. Moderate/severe atherosclerotic narrowing within the V1 right vertebral artery. The non dominant left vertebral artery is developmentally diminutive, but patent throughout the neck Skeleton: Reversal of the expected cervical lordosis. Cervical spondylosis with multilevel disc space narrowing, posterior disc osteophytes and uncovertebral hypertrophy. Other neck: No neck mass or cervical lymphadenopathy. Upper chest: No consolidation within the imaged lung apices. 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 There is minimal reconstitution of flow within the supraclinoid left ICA. The M1 and M2 left middle cerebral arteries are patent, although with asymmetrically decreased opacification as compared to the right side. Additionally, there is a moderate to moderately severe focal stenosis within a superior division proximal M2 left MCA branch (series 8, image 17). The intracranial right internal carotid artery is patent. However, there are sites of severe stenosis within the  cavernous and paraclinoid segments. The M1 right middle cerebral artery is patent without significant stenosis. No right M2 proximal branch occlusion is identified. There is a moderate to moderately severe stenosis within an inferior division mid M2 right MCA branch vessel (series 9, image 19). The A1 left anterior cerebral artery is hypoplastic. The A1 right anterior cerebral artery is dominant. The A2 and more distal anterior cerebral arteries are patent. However, there are multifocal high-grade stenoses within the bilateral A2 anterior cerebral arteries. Posterior circulation: The non dominant intracranial left vertebral artery is developmentally diminutive, but patent and terminates as the left PICA. The dominant intracranial right vertebral artery is patent, although with sites of moderate stenosis. The basilar artery is patent. The posterior cerebral arteries are patent. However, there is a high-grade focal stenosis within the P2 left PCA and high-grade focal stenosis within the right PCA at the P2/P3 junction.  Venous sinuses: Within limitations of contrast timing, no convincing thrombus. Anatomic variants: Posterior communicating arteries are hypoplastic or absent bilaterally. Review of the MIP images confirms the above findings CT Brain Perfusion Findings: ASPECTS: 4 CBF (<30%) Volume: 89mL (left MCA vascular territory) Perfusion (Tmax>6.0s) volume: 149mL (left MCA vascular territory) Mismatch Volume: 60mL Infarction Location:Left MCA vascular territory. These results were called by telephone at the time of interpretation on 02/23/2020 at 12:40 pm to provider [facility] K[facility]DINA , who verbally acknowledged these results. IMPRESSION: CTA neck: 1. 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 2. There is at least moderate calcified plaque at the right carotid bifurcation. There is stenosis at the origin of the cervical right vertebral artery which is  poorly quantified due to motion degradation and blooming artifact from calcified plaque at this site. Carotid artery duplex is recommended for further evaluation. Additionally, there is apparent 50-60% stenosis of the mid cervical right ICA. 3. Moderate/severe atherosclerotic narrowing within the Dom V1 right vertebral artery. 4. The left vertebral artery is developmentally diminutive, but patent throughout the neck. CTA head: 1. The left internal carotid artery remains occluded intracranially throughout the siphon region. There is minimal reconstitution of flow within the supraclinoid left ICA. The M1 and M2 left middle cerebral arteries are patent, although with asymmetrically decreased opacification as compared to the right side. Additionally, there is a moderate to moderately severe focal stenosis within a superior division proximal M2 left MCA branch. 2. Additional intracranial atherosclerotic disease with multifocal stenoses, most notably as follows. 3. High-grade stenoses within the cavernous/paraclinoid right ICA. 4. Moderate to moderately severe focal stenosis within an inferior division mid M2 right MCA branch vessel. 5. Moderate stenosis within the V4 right vertebral artery. 6. High-grade stenosis within the P2 left PCA. 7. High-grade stenosis within the right PCA at the P2/P3 junction. 8. Multifocal high-grade stenoses within the A2 anterior cerebral arteries bilaterally. CT perfusion head: The perfusion software identifies an 89 mL core infarct within the left MCA vascular territory. The perfusion software identifies a 149 mL region of critically hypoperfused parenchyma within the left MCA vascular territory. Reported mismatch volume 60 mL. Electronically Signed   By: Jackey LogeKyle  Golden DO   On: 02/23/2020 12:53   Portable Chest x-ray  Result Date: 02/22/2020 CLINICAL DATA:  Left cerebral artery and polyp stroke, postop bilateral iliofemoral thrombectomy, ETT EXAM: PORTABLE CHEST 1 VIEW COMPARISON:   01/12/2013 FINDINGS: Lungs are essentially clear.  No pleural effusion or pneumothorax. The heart is normal in size. Endotracheal tube terminates 5 cm above the carina. Enteric tube terminates in the proximal gastric body. IMPRESSION: Endotracheal tube terminates 5 cm above the carina. Enteric tube terminates in the proximal gastric body. Electronically Signed   By: Charline BillsSriyesh  Krishnan M.D.   On: 02/22/2020 08:54   CT HEAD CODE STROKE WO CONTRAST  Result Date: 02/23/2020 CLINICAL DATA:  Code stroke. Neuro deficit, acute, stroke suspected. Mental status change at 11 a.m., last known well 8 a.m. this morning. EXAM: CT HEAD WITHOUT CONTRAST TECHNIQUE: Contiguous axial images were obtained from the base of the skull through the vertex without intravenous contrast. COMPARISON:  Brain MRI 09/17/2018 FINDINGS: Brain: There is a large acute demarcated ischemic infarct within the left MCA vascular territory affecting the left frontal, temporal and parietal lobes as well as left insula and subinsular region. The left caudate and lentiform nuclei are also affected. No evidence of hemorrhagic conversion. There is mild associated  mass effect with 3 mm rightward midline shift. Redemonstrated superimposed small chronic infarcts within the left cerebral hemisphere. Background mild generalized parenchymal atrophy and chronic small vessel ischemic disease. No extra-axial fluid collection. No evidence of intracranial mass. Vascular: No hyperdense vessel Skull: Atherosclerotic calcifications. Sinuses/Orbits: Visualized orbits show no acute finding. Mild ethmoid sinus mucosal thickening. No significant mastoid effusion. ASPECTS [facility] Stroke Program Early CT Score) - Ganglionic level infarction (caudate, lentiform nuclei, internal capsule, insula, M1-M3 cortex): 2 - Supraganglionic infarction (M4-M6 cortex): 2 Total score (0-10 with 10 being normal): 4 These results were called by telephone at the time of interpretation on 02/23/2020  at 12:20 pm to provider Erick Blinks, who verbally acknowledged these results. IMPRESSION: Large acute demarcated left MCA vascular territory as described. ASPECTS is 4. No evidence of hemorrhagic conversion. Mild mass effect with 3 mm rightward midline shift. Stable background mild generalized parenchymal atrophy and chronic small vessel ischemic disease. Superimposed small chronic infarcts within the left cerebral hemisphere. Electronically Signed   By: Jackey Loge DO   On: 02/23/2020 12:22   CT ANGIO HEAD CODE STROKE  Result Date: 02/23/2020 CLINICAL DATA:  Neuro deficit, acute, stroke suspected. EXAM: CT ANGIOGRAPHY HEAD AND NECK CT PERFUSION BRAIN TECHNIQUE: Multidetector CT imaging of the head and neck was performed using the standard protocol during bolus administration of intravenous contrast. Multiplanar CT image reconstructions and MIPs were obtained to evaluate the vascular anatomy. Carotid stenosis measurements (when applicable) are obtained utilizing NASCET criteria, using the distal internal carotid diameter as the denominator. Multiphase CT imaging of the brain was performed following IV bolus contrast injection. Subsequent parametric perfusion maps were calculated using RAPID software. CONTRAST:  OMNIPAQUE IOHEXOL 350 MG/ML SOLN COMPARISON:  Noncontrast head CT performed earlier the same day. FINDINGS: CTA NECK FINDINGS Aortic arch: The left vertebral artery arises directly from the aortic arch. Atherosclerotic plaque within the visualized aortic arch and proximal major branch vessels of the neck. No hemodynamically significant innominate or proximal subclavian artery stenosis. Right carotid system: The CCA and ICA are patent within the neck. Atherosclerotic plaque within these vessels. Most notably there is at least moderate calcified plaque within the carotid bifurcation. Exact quantification of stenosis within the proximal right ICA is difficult due to motion artifact and blooming  artifact of calcified plaque at this site. Additionally, there is 50-60% stenosis of the mid cervical ICA. Left carotid system: The CCA is patent to the bifurcation. 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 Vertebral arteries: The dominant right vertebral artery is patent throughout the neck. Moderate/severe atherosclerotic narrowing within the V1 right vertebral artery. The non dominant left vertebral artery is developmentally diminutive, but patent throughout the neck Skeleton: Reversal of the expected cervical lordosis. Cervical spondylosis with multilevel disc space narrowing, posterior disc osteophytes and uncovertebral hypertrophy. Other neck: No neck mass or cervical lymphadenopathy. Upper chest: No consolidation within the imaged lung apices. 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 There is minimal reconstitution of flow within the supraclinoid left ICA. The M1 and M2 left middle cerebral arteries are patent, although with asymmetrically decreased opacification as compared to the right side. Additionally, there is a moderate to moderately severe focal stenosis within a superior division proximal M2 left MCA branch (series 8, image 17).  The intracranial right internal carotid artery is patent. However, there are sites of severe stenosis within the cavernous and paraclinoid segments. The M1 right middle cerebral artery is patent without significant stenosis. No right M2 proximal branch occlusion is identified. There is a moderate to moderately severe stenosis within an inferior division mid M2 right MCA branch vessel (series 9, image 19). The A1 left anterior cerebral artery is hypoplastic. The A1 right anterior cerebral artery is dominant. The A2 and  more distal anterior cerebral arteries are patent. However, there are multifocal high-grade stenoses within the bilateral A2 anterior cerebral arteries. Posterior circulation: The non dominant intracranial left vertebral artery is developmentally diminutive, but patent and terminates as the left PICA. The dominant intracranial right vertebral artery is patent, although with sites of moderate stenosis. The basilar artery is patent. The posterior cerebral arteries are patent. However, there is a high-grade focal stenosis within the P2 left PCA and high-grade focal stenosis within the right PCA at the P2/P3 junction. Venous sinuses: Within limitations of contrast timing, no convincing thrombus. Anatomic variants: Posterior communicating arteries are hypoplastic or absent bilaterally. Review of the MIP images confirms the above findings CT Brain Perfusion Findings: ASPECTS: 4 CBF (<30%) Volume: 89mL (left MCA vascular territory) Perfusion (Tmax>6.0s) volume: (left MCA vascular territory) Mismatch Volume: 60mL Infarction Location:Left MCA vascular territory. These results were called by telephone at the time of interpretation on 02/23/2020 at 12:40 pm to provider [facility] , who verbally acknowledged these results. IMPRESSION: CTA neck: 1. 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 2. There is at least moderate calcified plaque at the right carotid bifurcation. There is stenosis at the origin of the cervical right vertebral artery which is poorly quantified due to motion degradation and blooming artifact from calcified plaque at this site. Carotid artery duplex is recommended for further evaluation. Additionally, there is apparent 50-60% stenosis of the mid cervical right ICA. 3. Moderate/severe atherosclerotic narrowing within the Dom V1 right vertebral artery. 4. The left vertebral artery is developmentally diminutive, but patent throughout  the neck. CTA head: 1. The left internal carotid artery remains occluded intracranially throughout the siphon region. There is minimal reconstitution of flow within the supraclinoid left ICA. The M1 and M2 left middle cerebral arteries are patent, although with asymmetrically decreased opacification as compared to the right side. Additionally, there is a moderate to moderately severe focal stenosis within a superior division proximal M2 left MCA branch. 2. Additional intracranial atherosclerotic disease with multifocal stenoses, most notably as follows. 3. High-grade stenoses within the cavernous/paraclinoid right ICA. 4. Moderate to moderately severe focal stenosis within an inferior division mid M2 right MCA branch vessel. 5. Moderate stenosis within the V4 right vertebral artery. 6. High-grade stenosis within the P2 left PCA. 7. High-grade stenosis within the right PCA at the P2/P3 junction. 8. Multifocal high-grade stenoses within the A2 anterior cerebral arteries bilaterally. CT perfusion head: The perfusion software identifies an 89 mL core infarct within the left MCA vascular territory. The perfusion software identifies a 149 mL region of critically hypoperfused parenchyma within the left MCA vascular territory. Reported mismatch volume 60 mL. Electronically Signed   By: Jackey Loge DO   On: 02/23/2020 12:53   CT ANGIO NECK CODE STROKE  Result Date: 02/23/2020 CLINICAL DATA:  Neuro deficit, acute, stroke suspected. EXAM: CT ANGIOGRAPHY HEAD AND NECK CT PERFUSION BRAIN TECHNIQUE: Multidetector CT imaging of the head and neck was  performed using the standard protocol during bolus administration of intravenous contrast. Multiplanar CT image reconstructions and MIPs were obtained to evaluate the vascular anatomy. Carotid stenosis measurements (when applicable) are obtained utilizing NASCET criteria, using the distal internal carotid diameter as the denominator. Multiphase CT imaging of the brain was performed  following IV bolus contrast injection. Subsequent parametric perfusion maps were calculated using RAPID software. CONTRAST:  OMNIPAQUE IOHEXOL 350 MG/ML SOLN COMPARISON:  Noncontrast head CT performed earlier the same day. FINDINGS: CTA NECK FINDINGS Aortic arch: The left vertebral artery arises directly from the aortic arch. Atherosclerotic plaque within the visualized aortic arch and proximal major branch vessels of the neck. No hemodynamically significant innominate or proximal subclavian artery stenosis. Right carotid system: The CCA and ICA are patent within the neck. Atherosclerotic plaque within these vessels. Most notably there is at least moderate calcified plaque within the carotid bifurcation. Exact quantification of stenosis within the proximal right ICA is difficult due to motion artifact and blooming artifact of calcified plaque at this site. Additionally, there is 50-60% stenosis of the mid cervical ICA. Left carotid system: The CCA is patent to the bifurcation. 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 Vertebral arteries: The dominant right vertebral artery is patent throughout the neck. Moderate/severe atherosclerotic narrowing within the V1 right vertebral artery. The non dominant left vertebral artery is developmentally diminutive, but patent throughout the neck Skeleton: Reversal of the expected cervical lordosis. Cervical spondylosis with multilevel disc space narrowing, posterior disc osteophytes and uncovertebral hypertrophy. Other neck: No neck mass or cervical lymphadenopathy. Upper chest: No consolidation within the imaged lung apices. 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 There is minimal reconstitution of flow within the  supraclinoid left ICA. The M1 and M2 left middle cerebral arteries are patent, although with asymmetrically decreased opacification as compared to the right side. Additionally, there is a moderate to moderately severe focal stenosis within a superior division proximal M2 left MCA branch (series 8, image 17). The intracranial right internal carotid artery is patent. However, there are sites of severe stenosis within the cavernous and paraclinoid segments. The M1 right middle cerebral artery is patent without significant stenosis. No right M2 proximal branch occlusion is identified. There is a moderate to moderately severe stenosis within an inferior division mid M2 right MCA branch vessel (series 9, image 19). The A1 left anterior cerebral artery is hypoplastic. The A1 right anterior cerebral artery is dominant. The A2 and more distal anterior cerebral arteries are patent. However, there are multifocal high-grade stenoses within the bilateral A2 anterior cerebral arteries. Posterior circulation: The non dominant intracranial left vertebral artery is developmentally diminutive, but patent and terminates as the left PICA. The dominant intracranial right vertebral artery is patent, although with sites of moderate stenosis. The basilar artery is patent. The posterior cerebral arteries are patent. However, there is a high-grade focal stenosis within the P2 left PCA and high-grade focal stenosis within the right PCA at the P2/P3 junction. Venous sinuses: Within limitations of contrast timing, no convincing thrombus. Anatomic variants: Posterior communicating arteries are hypoplastic or absent bilaterally. Review of the MIP images confirms the above findings CT Brain Perfusion Findings: ASPECTS: 4 CBF (<30%) Volume: 67mL (left MCA vascular territory) Perfusion (Tmax>6.0s) volume: (left MCA vascular  territory) Mismatch Volume: 60mL Infarction Location:Left MCA vascular territory. These results were called by telephone  at the time of interpretation on 02/23/2020 at 12:40 pm to provider [facility] , who verbally acknowledged these results. IMPRESSION: CTA neck: 1. 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 2. There is at least moderate calcified plaque at the right carotid bifurcation. There is stenosis at the origin of the cervical right vertebral artery which is poorly quantified due to motion degradation and blooming artifact from calcified plaque at this site. Carotid artery duplex is recommended for further evaluation. Additionally, there is apparent 50-60% stenosis of the mid cervical right ICA. 3. Moderate/severe atherosclerotic narrowing within the Dom V1 right vertebral artery. 4. The left vertebral artery is developmentally diminutive, but patent throughout the neck. CTA head: 1. The left internal carotid artery remains occluded intracranially throughout the siphon region. There is minimal reconstitution of flow within the supraclinoid left ICA. The M1 and M2 left middle cerebral arteries are patent, although with asymmetrically decreased opacification as compared to the right side. Additionally, there is a moderate to moderately severe focal stenosis within a superior division proximal M2 left MCA branch. 2. Additional intracranial atherosclerotic disease with multifocal stenoses, most notably as follows. 3. High-grade stenoses within the cavernous/paraclinoid right ICA. 4. Moderate to moderately severe focal stenosis within an inferior division mid M2 right MCA branch vessel. 5. Moderate stenosis within the V4 right vertebral artery. 6. High-grade stenosis within the P2 left PCA. 7. High-grade stenosis within the right PCA at the P2/P3 junction. 8. Multifocal high-grade stenoses within the A2 anterior cerebral arteries bilaterally. CT perfusion head: The perfusion software identifies an 89 mL core infarct within the left MCA vascular territory. The  perfusion software identifies a 149 mL region of critically hypoperfused parenchyma within the left MCA vascular territory. Reported mismatch volume 60 mL. Electronically Signed   By: Jackey Loge DO   On: 02/23/2020 12:53    PHYSICAL EXAM  Temp:  [98.7 F (37.1 C)-100.6 F (38.1 C)] 98.7 F (37.1 C) (08/09 0800) Pulse Rate:  [89-126] 93 (08/09 0600) Resp:  [18-32] 21 (08/09 0600) BP: (120-171)/(65-99) 151/80 (08/09 0600) SpO2:  [91 %-99 %] 95 % (08/09 0600) Arterial Line BP: (94-166)/(55-149) 152/127 (08/09 0600)  General - Well nourished, well developed, in no apparent distress.  Ophthalmologic - fundi not visualized due to noncooperation.  Cardiovascular - Regular rhythm and rate.  Neuro - awake alert, however, global aphasia with word salad. Only able to close eyes on command, but then perseverated on it. Not able to follow peripheral commands. Eyes left gaze preference but able to cross midline, right gaze incomplete. Blinking to visual threat on the left but not on the right. Right significant facial droop. Tongue midline in mouth. LUE 5/5, RUE flaccid. LLE 3/5 and RLE 3-/5 proximally. Not moving toes bilaterally. Sensation, coordination not cooperative and gait not tested.   ASSESSMENT/PLAN Mr. NIAL HAWE is a 59 y.o. male with history of hypertension, hyperlipidemia, smoker, stroke in 09/2019 admitted for bilateral acute limb ischemia status post stenting with reocclusion status post thrombectomy and endarterectomy as well as left leg fasciotomy.  Developed aphasia and right hemiplegia.  CT showed left large MCA infarct.  Transferred to [facility] for further evaluation. No tPA given due to on argatroban and established stroke.    Stroke:  left large MCA infarct due to left ICA occlusion most likely due to chronic left  carotid artery stenosis/occlusion in the setting of operation (possible hypotension), but HIT needs to be ruled out  CT head left MCA large infarct  CTA head and  neck left ICA occlusion, right ICA bulb moderate stenosis with mid cervical right ICA 50 to 60% stenosis.  High-grade stenosis right ICA siphon.  Bilateral M2 moderate to severe focal stenosis.  Right V1 and V4 moderate to severe stenosis.  High-grade stenosis bilateral P2 and A2.  Carotid Doppler left ICA occluded at origin, right ICA less than 50% stenosis.  Repeat CT left MCA infarct increased size and surrounding edema  2D Echo EF 60 to 65%  LDL 71  HgbA1c 6.2  SCDs for VTE prophylaxis -> consider Arixtra??  Argatroban prior to admission, now on aspirin 325 mg daily and clopidogrel 75 mg daily.   Patient counseled to be compliant with his antithrombotic medications  Ongoing aggressive stroke risk factor management  Therapy recommendations: Pending  Disposition: Pending  Cerebral edema  CT showed large left MCA infarct with mild midline shift  On 3% saline  Sodium goal 150-155  Sodium check every 6 hours  ?? HIT  Platelet [id number]  Was on argatroban, now off  HIT panel pending  Avoid heparin or Lovenox  May consider Arixtra for DVT prophylaxis  Bilateral ischemic lower limb  S/p bilateral iliac artery stents  Bilateral stent reocclusion -> bilateral thrombectomy and endarterectomy + as well as left LE fasciotomy  VVS on board  On fasciotomy drainage  History of stroke  09/2019 presented with slurred speech and incoherent speech.  MRI showed left MCA scattered infarcts.  No MRI or CTA performed.  However from MRI T2 imaging concerning for left ICA stenosis  Patient otherwise for admission at that time for further stroke work-up however patient refused and signed AMA.  Home medication including aspirin 325, Plavix 75 and Lipitor 80, not sure compliance  No neurology follow-up noted  Hypertension . Stable on the high end . Permissive hypertension (OK if <180/105) given recent surgery  Long term BP goal 130-150 given left ICA  occlusion  Hyperlipidemia  Home meds: Lipitor 80  LDL 71, goal < 70  Now on Lipitor 80  Continue statin at discharge  Transaminitis  AST/ALT 203/46  Repeat in a.m.  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 This patient's care requires constant monitoring of vital signs, hemodynamics, respiratory and cardiac monitoring, review of multiple databases, neurological assessment, discussion with family, other specialists and medical decision making of high complexity. I spent 40 minutes of neurocritical care time in the care of this patient.  Marvel Plan, MD PhD Stroke Neurology 02/24/2020 10:55 AM    To contact Stroke Continuity provider, please refer to [email or web address]. After hours, contact General  Neurology

## 2020-02-24 NOTE — Progress Notes (Signed)
Modified Barium Swallow Progress Note  Patient Details  Name: Jonathan [name] MRN: [id number] Date of Birth: [birth year]  Today's Date: 02/24/2020  Modified Barium Swallow completed.  Full report located under Chart Review in the Imaging Section.  Brief recommendations include the following:  Clinical Impression  Pt presents with a primary oral dysphagia c/b impaired bolus cohesion and control over release into the pharynx.  There was decreased sensation on the right, with barium residue in right lateral and anterior sulci and limited awareness of its presence.  Pharyngeal function was largely intact, with adequate pharyngeal squeeze, no residue post-swallow, and effective laryngeal vestibule closure.  Occasionally, thin and nectar thick barium reached the pyriforms for a delayed period of time before the swallow was triggered.  On other occasions, the swallow trigger was timely.  There was one incident of penetration to the vocal folds when a bolus of nectar thick liquid prematurely escaped the oral cavity and entered the laryngeal vestibule without eliciting a response.  Pt's impaired sensation appears to be the primary concern and puts him at greater risk of aspiration.  For now, recommend starting a dysphagia 1 diet, thin liquids from cup only - NO STRAWS; give meds whole in applesauce/pudding.  Due to pt's impulsivity, difficulty following commands, and poor sensation/awareness on right side of mouth/throat, he will require careful supervision during meals.  SLP will follow for safety/diet progression and therapeutic training.     Swallow Evaluation Recommendations       SLP Diet Recommendations: Dysphagia 1 (Puree) solids;Thin liquid   Liquid Administration via: Cup;No straw   Medication Administration: Whole meds with puree   Supervision: Full supervision/cueing for compensatory strategies   Compensations: Minimize environmental distractions;Slow rate;Small sips/bites       Oral  Care Recommendations: Oral care BID      Jonathan Gladson L. Samson Frederic, MA CCC/SLP Acute Rehabilitation Services Office number [phone] Pager [phone]   Jonathan [name] 02/24/2020,2:23 PM

## 2020-02-25 DIAGNOSIS — R0602 Shortness of breath: Secondary | ICD-10-CM

## 2020-02-25 LAB — BASIC METABOLIC PANEL
Anion gap: 14 (ref 5–15)
BUN: 10 mg/dL (ref 6–20)
CO2: 22 mmol/L (ref 22–32)
Calcium: 8.7 mg/dL — ABNORMAL LOW (ref 8.9–10.3)
Chloride: 116 mmol/L — ABNORMAL HIGH (ref 98–111)
Creatinine, Ser: 0.72 mg/dL (ref 0.61–1.24)
GFR calc Af Amer: >60 mL/min (ref >60)
GFR calc non Af Amer: >60 mL/min (ref >60)
Glucose, Bld: 155 mg/dL — ABNORMAL HIGH (ref 70–99)
Potassium: 3.2 mmol/L — ABNORMAL LOW (ref 3.5–5.1)
Sodium: 152 mmol/L — ABNORMAL HIGH (ref 135–145)

## 2020-02-25 LAB — BLOOD GAS, ARTERIAL
Acid-base deficit: 19.1 mmol/L — ABNORMAL HIGH (ref 0.0–2.0)
Bicarbonate: 15.3 mmol/L — ABNORMAL LOW (ref 20.0–28.0)
MECHVT: 500 mL
Mechanical Rate: 16
O2 Content: 1 L/min
O2 Saturation: 52.5 %
PEEP: 5 cmH2O
RATE: 16 resp/min
pCO2 arterial: 92 mmHg (ref 32.0–48.0)
pH, Arterial: 6.83 — CL (ref 7.350–7.450)
pO2, Arterial: 22 mmHg — CL (ref 83.0–108.0)

## 2020-02-25 LAB — TYPE AND SCREEN
ABO/RH(D): B POS
Antibody Screen: NEGATIVE
Unit division: 0
Unit division: 0

## 2020-02-25 LAB — TROPONIN I (HIGH SENSITIVITY)
Troponin I (High Sensitivity): 2259 ng/L (ref <18)
Troponin I (High Sensitivity): 2260 ng/L (ref <18)
Troponin I (High Sensitivity): 3089 ng/L (ref <18)

## 2020-02-25 LAB — CBC
HCT: 34.8 % — ABNORMAL LOW (ref 39.0–52.0)
Hemoglobin: 11.5 g/dL — ABNORMAL LOW (ref 13.0–17.0)
MCH: 31.3 pg (ref 26.0–34.0)
MCHC: 33 g/dL (ref 30.0–36.0)
MCV: 94.8 fL (ref 80.0–100.0)
Platelets: 160 10*3/uL (ref 150–400)
RBC: 3.67 MIL/uL — ABNORMAL LOW (ref 4.22–5.81)
RDW: 14.1 % (ref 11.5–15.5)
WBC: 11.9 10*3/uL — ABNORMAL HIGH (ref 4.0–10.5)
nRBC: 0.2 % (ref 0.0–0.2)

## 2020-02-25 LAB — SODIUM
Sodium: 153 mmol/L — ABNORMAL HIGH (ref 135–145)
Sodium: 153 mmol/L — ABNORMAL HIGH (ref 135–145)
Sodium: 153 mmol/L — ABNORMAL HIGH (ref 135–145)

## 2020-02-25 LAB — HEPATIC FUNCTION PANEL
ALT: 56 U/L — ABNORMAL HIGH (ref 0–44)
AST: 163 U/L — ABNORMAL HIGH (ref 15–41)
Albumin: 2.8 g/dL — ABNORMAL LOW (ref 3.5–5.0)
Alkaline Phosphatase: 42 U/L (ref 38–126)
Bilirubin, Direct: 0.3 mg/dL — ABNORMAL HIGH (ref 0.0–0.2)
Indirect Bilirubin: 0.8 mg/dL (ref 0.3–0.9)
Total Bilirubin: 1.1 mg/dL (ref 0.3–1.2)
Total Protein: 6.4 g/dL — ABNORMAL LOW (ref 6.5–8.1)

## 2020-02-25 LAB — MAGNESIUM: Magnesium: 2.2 mg/dL (ref 1.7–2.4)

## 2020-02-25 LAB — BPAM RBC
Blood Product Expiration Date: 202109012359
Blood Product Expiration Date: 202109012359
ISSUE DATE / TIME: 202108082211
ISSUE DATE / TIME: 202108090021
Unit Type and Rh: 7300
Unit Type and Rh: 7300

## 2020-02-25 LAB — TSH: TSH: 1.262 u[IU]/mL (ref 0.350–4.500)

## 2020-02-25 MED ORDER — POTASSIUM CHLORIDE 10 MEQ/100ML IV SOLN
10.0000 meq | INTRAVENOUS | Status: AC
  Administered 2020-02-25 (×4): 10 meq via INTRAVENOUS

## 2020-02-25 MED ORDER — DILTIAZEM HCL-DEXTROSE 125-5 MG/125ML-% IV SOLN (PREMIX)
5.0000 mg/h | INTRAVENOUS | Status: DC
  Administered 2020-02-25: 5 mg/h via INTRAVENOUS
  Administered 2020-02-25: 7.5 mg/h via INTRAVENOUS
  Filled 2020-02-25 (×4): qty 125

## 2020-02-25 MED ORDER — ENOXAPARIN SODIUM 40 MG/0.4ML ~~LOC~~ SOLN
40.0000 mg | SUBCUTANEOUS | Status: DC
  Administered 2020-02-25 – 2020-02-29 (×5): 40 mg via SUBCUTANEOUS
  Filled 2020-02-25 (×4): qty 0.4

## 2020-02-25 MED ORDER — HYDRALAZINE HCL 20 MG/ML IJ SOLN
10.0000 mg | INTRAMUSCULAR | Status: DC | PRN
  Administered 2020-02-25 – 2020-02-26 (×2): 10 mg via INTRAVENOUS
  Filled 2020-02-25 (×2): qty 1

## 2020-02-25 MED ORDER — THIAMINE HCL 100 MG PO TABS
100.0000 mg | ORAL_TABLET | Freq: Every day | ORAL | Status: AC
  Administered 2020-02-25 – 2020-02-29 (×5): 100 mg via ORAL
  Filled 2020-02-25 (×5): qty 1

## 2020-02-25 MED ORDER — METOPROLOL TARTRATE 5 MG/5ML IV SOLN
2.5000 mg | Freq: Four times a day (QID) | INTRAVENOUS | Status: DC
  Administered 2020-02-25 – 2020-02-26 (×5): 2.5 mg via INTRAVENOUS
  Filled 2020-02-25 (×4): qty 5

## 2020-02-25 MED ORDER — LORAZEPAM 2 MG/ML IJ SOLN: 1.0000 mg | INTRAMUSCULAR | Status: DC | PRN

## 2020-02-25 MED ORDER — FOLIC ACID 1 MG PO TABS
1.0000 mg | ORAL_TABLET | Freq: Every day | ORAL | Status: DC
  Administered 2020-02-25 – 2020-03-10 (×15): 1 mg via ORAL
  Filled 2020-02-25 (×15): qty 1

## 2020-02-25 MED ORDER — THIAMINE HCL 100 MG/ML IJ SOLN
100.0000 mg | INTRAMUSCULAR | Status: DC
  Administered 2020-02-25: 100 mg via INTRAVENOUS
  Filled 2020-02-25: qty 2

## 2020-02-25 MED ORDER — LORAZEPAM 1 MG PO TABS: 1.0000 mg | ORAL_TABLET | ORAL | Status: DC | PRN

## 2020-02-25 NOTE — Progress Notes (Signed)
Did not do CPT.  Patient just vomited.  WiIll hold and reassess next time due.

## 2020-02-25 NOTE — Progress Notes (Signed)
eLink Physician-Brief Progress Note Patient Name: BRENNON OTTERNESS DOB: Jan 22, 1961 MRN: [id number]   Date of Service  02/25/2020  HPI/Events of Note  New onset AFIB with RVR - Ventricular rate = 166. BP = 186/118.  eICU Interventions  Plan: 1. 12 Lead EKG STAT. 2. Cardizem IV infusion. Titrate to HR = 65-105.  3. Cycle Troponin.  4. Send AM labs and Mg++ level now.      Intervention Category Major Interventions: Arrhythmia - evaluation and management  Lewayne Pauley Eugene 02/25/2020, 4:18 AM

## 2020-02-25 NOTE — Progress Notes (Addendum)
Progress Note    02/25/2020 9:27 AM 3 Days Post Op  Subjective:  Awake-receiving pulmonary toilet.   Tm 99.7  Vitals:   02/25/20 0845 02/25/20 0900  BP: (!) 166/76 (!) 165/97  Pulse: (!) 102 96  Resp: (!) 29 (!) 28  Temp:    SpO2: 97% 97%    Physical Exam: Incisions:  Right groin with Pravena vac with good seal; left groin clean and looks good; left BK incisions both look good with staples in tact.  Extremities:  Bilateral feet are warm and well perfused.  Bilateral calves are soft and non tender.   CBC    Component Value Date/Time   WBC 11.9 (H) 02/25/2020 0424   RBC 3.67 (L) 02/25/2020 0424   HGB 11.5 (L) 02/25/2020 0424   HGB 16.8 08/14/2012 1008   HCT 34.8 (L) 02/25/2020 0424   HCT 49.0 08/14/2012 1008   PLT 160 02/25/2020 0424   PLT 291 08/14/2012 1008   MCV 94.8 02/25/2020 0424   MCV 92 08/14/2012 1008   MCH 31.3 02/25/2020 0424   MCHC 33.0 02/25/2020 0424   RDW 14.1 02/25/2020 0424   RDW 13.5 08/14/2012 1008   LYMPHSABS 3.3 09/17/2019 0927   MONOABS 0.9 09/17/2019 0927   EOSABS 0.1 09/17/2019 0927   BASOSABS 0.1 09/17/2019 0927    BMET    Component Value Date/Time   NA 153 (H) 02/25/2020 0630   NA 138 08/14/2012 1008   K 3.2 (L) 02/25/2020 0424   K 3.9 08/14/2012 1008   CL 116 (H) 02/25/2020 0424   CL 103 08/14/2012 1008   CO2 22 02/25/2020 0424   CO2 27 08/14/2012 1008   GLUCOSE 155 (H) 02/25/2020 0424   GLUCOSE 84 08/14/2012 1008   BUN 10 02/25/2020 0424   BUN 10 08/14/2012 1008   CREATININE 0.72 02/25/2020 0424   CREATININE 0.70 08/14/2012 1008   CALCIUM 8.7 (L) 02/25/2020 0424   CALCIUM 9.3 08/14/2012 1008   GFRNONAA >60 02/25/2020 0424   GFRNONAA >60 08/14/2012 1008   GFRAA >60 02/25/2020 0424   GFRAA >60 08/14/2012 1008    INR    Component Value Date/Time   INR 1.1 02/22/2020 0210     Intake/Output Summary (Last 24 hours) at 02/25/2020 4332 Last data filed at 02/25/2020 0900 Gross per 24 hour  Intake 2241.09 ml  Output  3025 ml  Net -783.91 ml     Assessment:  59 y.o. male is s/p:  Procedure:   #1: Bilateral iliofemoral thromboembolectomy                         #2: Left femoral endarterectomy with CorMatrix patch angioplasty                         #3: Left below-knee popliteal artery exposure and tibial thrombectomy                         #4: 4 compartment left leg fasciotomy                         #5: Bilateral groin Prevena wound VAC  3 Days Post op  Plan: -bilateral feet are warm and well perfused.  Batteries dead in doppler at bedside.  Pravena on right groin with good seal.  Left Pravena removed yesterday and this incision looks good.  Continue to keep dry.  Bilateral calves are soft.  Lower leg incisions look good. -due to stroke, pt not on AC.  Continue asa and add Plavix when cleared by Neuro per Dr. Vista Lawman, PA-C [facility] [phone] 02/25/2020 9:27 AM   AGgee with the above: -AFIB with RVR overnight.  Not a candidate for anticoagulation due to risk of hemorrhagic conversion of CVA which is secondary to left ICA occlusion  -s/p bilateral LE thrombectomies and left fasciotomies.  Incisions c/d/i.  Unclear etiology of iliac stent occlusion, so HIT being ruled out, although not likely.  I would like some form af anti-platelet or anticoagulation when safe per neuro as I think he is at risk for re-occlusion since there is no explanation as to why he did this.  Durene Cal

## 2020-02-25 NOTE — Progress Notes (Signed)
Pt in new a-fib w/ RVR 140-170s. New onset agitation, yelling into hallway unable to be redirected. CCM notified, orders received for stat EKG/labs and diltiazem drip. Neurology made aware of agitation, no other changes in neuro exam noted.

## 2020-02-25 NOTE — Progress Notes (Signed)
eLink Physician-Brief Progress Note Patient Name: Jonathan [name] DOB: [birth year] MRN: [id number]   Date of Service  02/25/2020  HPI/Events of Note  Hypertension - BP = 195/81. SBP goal < 180 and DBP goal < 105.  eICU Interventions  Plan: 1. Hydralazine 10 mg IV Q 4 hours PRN SBP > 180 or DBP > 105.      Intervention Category Major Interventions: Hypertension - evaluation and management  Antanette Richwine Eugene 02/25/2020, 3:42 AM

## 2020-02-25 NOTE — Progress Notes (Signed)
°  Speech Language Pathology Treatment: Dysphagia  Patient Details Name: Jonathan [name] MRN: [id number] DOB: 04/04/1961 Today's Date: 02/25/2020 Time: 1100-1115 SLP Time Calculation (min) (ACUTE ONLY): 15 min  Assessment / Plan / Recommendation Clinical Impression  Pt was seen for dysphagia tx following MBS on previous date. Events of last night noted, including increased WOB. Per RN, pt had breakfast this morning and seemed to do well, but had an episode of vomiting. Per RN still okay to give PO trials for speech therapy, but SLP limited trials to clear liquids only. Pt is impulsive with self-feeding and aphasia impacts ability to follow verbal commands, with pt therefore requiring full hand-over-hand assist to regulate pacing and bolus size. With assist from SLP, no overt s/s of aspiration are noted. Continue to recommend careful assistance during meals and close monitoring from SLP given cognitive-linguistic status and concerns for aspiration.    HPI HPI: 59 y.o. male with a history of hypertension, hyperlipidemia, CVA, severe PAD  who presented with acute critical limb ischemia secondary to occlusion of the iliac artery who underwent stenting on 8/6 at [facility].  He was taken back to the OR on 8/7 found to have acute thrombosis of bilateral iliac stents underwent thrombectomy and endarterectomy;  fasciotomies of the left leg. Extubated 8/8. Developed right hemiplegia and aphasia morning of 8/8. CTH: large territory infarct left MCA. Pt transferred to [facility] same day.        SLP Plan  Continue with current plan of care       Recommendations  Diet recommendations: Dysphagia 1 (puree);Thin liquid Liquids provided via: Cup;No straw Medication Administration: Whole meds with puree Supervision: Staff to assist with self feeding;Full supervision/cueing for compensatory strategies Compensations: Minimize environmental distractions;Slow rate;Small sips/bites Postural Changes and/or Swallow  Maneuvers: Seated upright 90 degrees;Upright 30-60 min after meal                Oral Care Recommendations: Oral care BID Follow up Recommendations: Inpatient Rehab SLP Visit Diagnosis: Dysphagia, oropharyngeal phase (R13.12) Plan: Continue with current plan of care       GO                Mahala Menghini., M.A. CCC-SLP Acute Rehabilitation Services Pager [phone] Office [phone]  02/25/2020, 12:41 PM

## 2020-02-25 NOTE — Progress Notes (Signed)
Discussed the patient with Dr. Arsenio Loader and Pharmacy. Given the large left MCA stoke which is at high risk for hemorrhagic conversion, the risks of heparin significantly outweigh the benefits in the setting of the patient's acute coronary syndrome. Holding heparin for now. Continuing ASA.   Electronically signed: Dr. Caryl Pina

## 2020-02-25 NOTE — Progress Notes (Signed)
RT holding CPT at this time due to patient eating at scheduled time. RT will attempt at next scheduled time. RT will continue to monitor as needed.

## 2020-02-25 NOTE — Progress Notes (Addendum)
NAME:  Jonathan [name], MRN:  [id number], DOB:  01/25/1961, LOS: 2 ADMISSION DATE:02/23/2020, CONSULTATION DATE:02/23/2020 REFERRING MD:Dr. Myra Gianotti, CHIEF COMPLAINT:L MCA stroke, vasculopathy  Brief History   Jonathan [name] is a 59 year old male with past medical history of hypertension, hyperlipidemia, tobacco abuse, significant PAD, who was admitted to [facility] for acute ischemia of the left lower extremity status post percutaneous transluminal angioplasty and stent placement bilateral common iliac arteries and external iliac arteries on 02/21/2020.  On 02/22/2020, he was found to have acute thrombosis of bilateral iliac stents which he underwent bilateral thrombectomy, left popliteal exposure and thrombectomy, left femoral endarterectomy and patch angioplasty as well as left fasciotomy.  Argatroban was started for concerning of heparin-induced thrombocytopenia.  On 02/23/2020, patient developed slurred speech, right-sided weakness.  CT head shows left MCA stroke.  Patient was transferred to [facility] for further care  Past Medical History  Hypertension Tobacco abuse Hyperlipidemia PAD  Significant Hospital Events   8/6-8/7--critical limb ischemia requiring surgical intervention 8/8--stroke in the face of anticoagulation with heparin, subsequently argatroban when rethrombosed.   Consults:  Neurology Vascular surgery  Procedures:  02/21/2020 angioplasty and stents in iliac arteries 02/22/2020 thrombectomy and endarterectomy and fasciotomy  Significant Diagnostic Tests:  8/8 CT head:Large acute demarcated left MCA vascular territory as described.ASPECTS is 4. No evidence of hemorrhagic conversion. Mild mass effect with 3 mm rightward midline shift. 8/8 CT angio head/neck:The cervical left ICA is occluded from its origin and remains occluded throughout the remainder of the neck.The left internal carotid artery remains occluded intracranially throughout the siphon  region. There is minimal reconstitution of flow within the supraclinoid left ICA. The M1 and M2 left middle cerebral arteries are patent, although with asymmetrically decreased opacification as compared to the right side. Additionally, there is a moderate to moderately severe focal stenosis within a superior division proximal M2 left MCA branch.The perfusion software identifies an 89 mL core infarct within the left MCA vascular territory. The perfusion software identifies a region of critically hypoperfused parenchyma within the left MCA vascular territory. Reported mismatch volume 60 mL  Micro Data:  8/6>> COVID neg 8/8>>MRSA negative  Antimicrobials:  None  Interim history/subjective:  Overnight event: Patient had an episode of increased work of breathing and respiratory distress in the afternoon which has to be put on 5 L of nasal cannula.  This happened after he was given some water orally.  Lung sounds was coarse likely secondary to mucous block.  Chest x-ray shows some mild infiltrates which could indicate aspiration.  He was made n.p.o. overnight.  Chest physiotherapy and hypertonic nebulizer was started to help break up the mucus  Patient also had an episode of A. fib with RVR with rate in the 140s 150s.  Diltiazem drip and metoprolol was given.  Current heart rate is 110 in the room and telemetry shows sinus tach.  Patient is seen at bedside today.  He denies chest pain or shortness of breath.  He also denies chest pain with breathing.  Patient is satting well on 2 L of nasal cannula.  His increase work of breathing is likely secondary to his frustration with inability to speak.  Objective   Blood pressure (!) 158/76, pulse 97, temperature 98.8 F (37.1 C), temperature source Axillary, resp. rate 19, SpO2 99 %.        Intake/Output Summary (Last 24 hours) at 02/25/2020 0737 Last data filed at 02/25/2020 0700 Gross per 24 hour  Intake 2359.27 ml  Output 3025  ml  Net  -665.73 ml   There were no vitals filed for this visit.  Examination: Physical Exam Constitutional:      Appearance: He is not toxic-appearing.     Comments: Slightly increased work of breathing but satting well on 2 L of nasal cannula.  HENT:     Head: Normocephalic.  Eyes:     General:        Right eye: No discharge.        Left eye: No discharge.     Conjunctiva/sclera: Conjunctivae normal.  Cardiovascular:     Rate and Rhythm: Regular rhythm. Tachycardia present.     Heart sounds: No murmur heard.   Pulmonary:     Effort: Respiratory distress (Mild) present.     Comments: Still coarse lung sound but satting well on 2 L of nasal cannula Abdominal:     General: There is no distension.     Palpations: Abdomen is soft.     Tenderness: There is no abdominal tenderness.  Musculoskeletal:     Cervical back: Normal range of motion.     Right lower leg: No edema.     Left lower leg: No edema.     Comments: Swollen and purplish scrotum  Skin:    General: Skin is warm.     Coloration: Skin is not jaundiced.  Neurological:     Mental Status: He is alert.  Psychiatric:     Comments: Frustration due to inability to express himself     Resolved Hospital Problem list     Assessment & Plan:  Left MCA stroke Acute lower leg ischemia status post angioplasty and stent placement  Aspiration pneumonitis A. fib with RVR Elevated Troponin Blood loss anemia  Plan:  Left ICA stroke Patient developed slurred speech and right-sided weakness on 8/8.  CT head showed large acute left MCA infarct with no acute hemorrhage, 3 mm rightward midline shift.  CTA shows occluded left ICA, moderately severe focal stenosis of proximal M2 left MCA branch, and high-grade stenosis of PCA.  Neurology is on board.  After speaking with Dr. Roda Shutters, this is likely an ischemic stroke secondary to hypotension following the procedure.  Patient had history of left MCA infarct in March 2021 and the hypotension  has exacerbated the carotid stenosis. -Appreciate neurology recommendation -Frequent neurochecks -Pending MRI -Continue aspirin 325 mg p.o. -Continue hypertonic saline IV 75 cc/h for cerebral edema with frequent sodium check per neurology   Acute lower leg ischemia status post angioplasty and stent placement Patient underwent thrombectomy and endarterectomy with fasciotomy.  There is a concern for heparin-induced thrombocytopenia even though the platelet drops is not significant.  Pending HIT panel.  Argatroban was held due to risk of hemorrhagic stroke conversion. -Pending HIT panel -Mechanical DVT prophylaxis   A. fib with RVR This seems to be a new diagnosis for him.  EKG shows A. fib with rate of 160, no clear evidence for ischemia.  His echo done yesterday shows a normal EF of 60-65% with normal right and left ventricular function.  Patient does drink alcohol but unknown amount due to his inability to speak.  Unknown last drink. AST/ALT ratio > 2. This A. fib can be a result of alcohol withdrawal.  Place patient on CIWA protocol with Ativan.  TSH normal. -Continue diltiazem drip and metoprolol -He is not a candidate for anticoagulation due to risk of hemorrhagic conversion and HIT.  Continue aspirin -CIWA with Ativan --Continue folate and thiamine   Elevated  troponin Troponin elevated at 2259 and 2260. Last Echo shows normal EF with no abnormal wall motion. EKG did not show any evidence of ischemia. This maybe secondary to his Afib.  Repeat EKG shows normal sinus rhythm with no obvious sign of ischemia.. Patient denies chest pain at the moment.  --Continue to trend troponin    Aspiration pneumonitis Patient passed barium swallow test but still aspirated yesterday afternoon.  We will resume food slowly and encourage patient to chew and swallow carefully. --Continue to monitor for sign of aspiration or respiratory distress -Continue chest physiotherapy and nebs --No antibiotics  indicated at this time   Blood loss anemia-stable -CBC daily -Transfuse if less than 7  Best practice:  Diet:puree Pain/Anxiety/Delirium protocol (if indicated):CIWA w Ativan VAP protocol (if indicated):n/a DVT prophylaxis:mechanical for now; high risk for hemorrhagic conversion of stroke vs DVT and risk of stent rethrombosis GI prophylaxis:protonix Glucose control:SSI if needed Mobility:bed Code Status:full Family Communication:will update Disposition:ICU  Critical care time: 30 min     Doran Stabler, DO Internal Medicine Residency My pager: [phone]

## 2020-02-25 NOTE — Progress Notes (Signed)
STROKE TEAM PROGRESS NOTE   SUBJECTIVE (INTERVAL HISTORY) His RN is at the bedside. Pt had rough night last night, he seems to have aspiration with SOB, then EKG showed afib with RVR, elevated troponin. There was discussion about whether needed heparin IV for ACS but due to risk of hemorrhagic conversion, heparin IV was not initiated. Fortunately, pt troponin curve flat, no concern of ACS at this time. He pulled off left groin catheter but doing OK and VVS on board. Pt awake alert today, still global aphasia but was gurgling sound, and some difficulty with oral secretions.   OBJECTIVE Temp:  [97.8 F (36.6 C)-99.7 F (37.6 C)] 99.5 F (37.5 C) (08/10 1200) Pulse Rate:  [33-161] 77 (08/10 1430) Cardiac Rhythm: Normal sinus rhythm (08/10 1200) Resp:  [18-38] 20 (08/10 1430) BP: (126-225)/(52-122) 147/74 (08/10 1430) SpO2:  [89 %-100 %] 96 % (08/10 1430) Arterial Line BP: (126)/(105) 126/105 (08/09 1813)  Recent Labs  Lab 02/23/20 0017 02/23/20 0346 02/23/20 0727 02/23/20 1128 02/23/20 1553  GLUCAP 164* 136* 130* 138* 146*   Recent Labs  Lab 02/22/20 1137 02/22/20 1137 02/23/20 0124 02/23/20 0124 02/23/20 1741 02/23/20 2113 02/24/20 0747 02/24/20 0747 02/24/20 1341 02/24/20 1930 02/25/20 0424 02/25/20 0630 02/25/20 1221  NA 139   < > 138   < > 139   < > 145   < > 147* 143 152* 153* 153*  K 5.0  --  3.7  --  3.3*  --  3.9  --   --   --  3.2*  --   --   CL 107  --  107  --  105  --  112*  --   --   --  116*  --   --   CO2 24  --  24  --  24  --  23  --   --   --  22  --   --   GLUCOSE 185*  --  158*  --  159*  --  135*  --   --   --  155*  --   --   BUN 14  --  18  --  11  --  8  --   --   --  10  --   --   CREATININE 0.86  --  0.84  --  0.68  --  0.66  --   --   --  0.72  --   --   CALCIUM 7.2*   < > 7.4*   < > 7.9*  --  8.0*  --   --   --  8.7*  --   --   MG  --   --   --   --  1.9  --   --   --   --   --  2.2  --   --   PHOS  --   --   --   --  1.9*  --   --   --   --    --   --   --   --    < > = values in this interval not displayed.   Recent Labs  Lab 02/22/20 0210 02/23/20 1741 02/25/20 0424  AST 25 203* 163*  ALT 32 46* 56*  ALKPHOS 37* 34* 42  BILITOT 0.9 0.6 1.1  PROT 5.5* 4.6* 6.4*  ALBUMIN 3.2* 2.3* 2.8*   Recent Labs  Lab 02/23/20 0124 02/23/20 0401 02/23/20  1741 02/24/20 1025 02/25/20 0424  WBC 10.1 9.9 10.1 10.0 11.9*  HGB 7.3* 7.2* 7.4* 9.4* 11.5*  HCT 21.7* 21.3* 22.3* 28.3* 34.8*  MCV 94.8 94.2 93.3 94.0 94.8  PLT 136* 126* PLATELET CLUMPS NOTED ON SMEAR, UNABLE TO ESTIMATE 117* 160   No results for input(s): CKTOTAL, CKMB, CKMBINDEX, TROPONINI in the last 168 hours. No results for input(s): LABPROT, INR in the last 72 hours. No results for input(s): COLORURINE, LABSPEC, PHURINE, GLUCOSEU, HGBUR, BILIRUBINUR, KETONESUR, PROTEINUR, UROBILINOGEN, NITRITE, LEUKOCYTESUR in the last 72 hours.  Invalid input(s): APPERANCEUR     Component Value Date/Time   CHOL 132 02/24/2020 0748   TRIG 129 02/24/2020 0748   HDL 35 (L) 02/24/2020 0748   CHOLHDL 3.8 02/24/2020 0748   VLDL 26 02/24/2020 0748   LDLCALC 71 02/24/2020 0748   Lab Results  Component Value Date   HGBA1C 6.2 (H) 02/23/2020   No results found for: LABOPIA, COCAINSCRNUR, LABBENZ, AMPHETMU, THCU, LABBARB  No results for input(s): ETH in the last 168 hours.  I have personally reviewed the radiological images below and agree with the radiology interpretations.  CT HEAD WO CONTRAST  Result Date: 02/24/2020 CLINICAL DATA:  Stroke follow-up EXAM: CT HEAD WITHOUT CONTRAST TECHNIQUE: Contiguous axial images were obtained from the base of the skull through the vertex without intravenous contrast. COMPARISON:  02/23/2020 head CT. FINDINGS: Brain: Sequela of evolving left MCA territory infarct. 3-4 mm rightward midline shift is unchanged. No intracranial hemorrhage. No mass lesion. No ventriculomegaly or extra-axial fluid collection. Vascular: No hyperdense vessel. Bilateral  skull base atherosclerotic calcifications. Skull: Negative for fracture or focal lesion. Sinuses/Orbits: Normal orbits. Soft tissue impacted left maxillary molar, unchanged. Mild right sphenoid sinus mucosal thickening. No mastoid effusion. Other: None. IMPRESSION: Evolving left MCA territory infarct with unchanged 3-4 mm rightward midline shift. No intracranial hemorrhage. Electronically Signed   By: Stana Bunting M.D.   On: 02/24/2020 09:29   CT HEAD WO CONTRAST  Result Date: 02/23/2020 CLINICAL DATA:  Stroke follow-up EXAM: CT HEAD WITHOUT CONTRAST TECHNIQUE: Contiguous axial images were obtained from the base of the skull through the vertex without intravenous contrast. COMPARISON:  Head CT 02/21/2020 FINDINGS: Brain: Large area of cytotoxic edema throughout the left MCA territory. No acute hemorrhage. 3 mm rightward midline shift. Vascular: Atherosclerotic calcification of the internal carotid arteries at the skull base. Skull: Normal. Negative for fracture or focal lesion. Sinuses/Orbits: No acute finding. Other: None. IMPRESSION: 1. Slightly increased cytotoxic edema within the left MCA territory. 2. No acute hemorrhage. 3. 3 mm rightward midline shift. Electronically Signed   By: Deatra Robinson M.D.   On: 02/23/2020 22:00   US Carotid Bilateral  Result Date: 02/23/2020 CLINICAL DATA:  Slurred speech.  Carotid stenosis. EXAM: BILATERAL CAROTID DUPLEX ULTRASOUND TECHNIQUE: Wallace Cullens scale imaging, color Doppler and duplex ultrasound were performed of bilateral carotid and vertebral arteries in the neck. COMPARISON:  CTA neck 02/23/2020 FINDINGS: Criteria: Quantification of carotid stenosis is based on velocity parameters that correlate the residual internal carotid diameter with NASCET-based stenosis levels, using the diameter of the distal internal carotid lumen as the denominator for stenosis measurement. The following velocity measurements were obtained: RIGHT ICA: 117/63 cm/sec CCA: 130/38 cm/sec  SYSTOLIC ICA/CCA RATIO:  0.9 ECA: 220 cm/sec LEFT ICA: Occluded CCA: 75/10 cm/sec ECA: 195 cm/sec RIGHT CAROTID ARTERY: Mild atherosclerotic disease in the right common carotid artery. Heterogeneous plaque at the right carotid bulb. External carotid artery is patent with normal waveform. Atherosclerotic plaque in  the proximal internal carotid artery. Normal waveforms and velocities in the internal carotid artery. RIGHT VERTEBRAL ARTERY: Antegrade flow and normal waveform in the right vertebral artery. LEFT CAROTID ARTERY: Left common carotid artery is patent with intimal thickening. Left carotid artery is patent. External carotid artery is patent with normal waveform. 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 No flow in the mid or distal left internal carotid artery. LEFT VERTEBRAL ARTERY: Antegrade flow and normal waveform in the left vertebral artery. IMPRESSION: 1. 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 Findings correspond with recent CTA findings. 2. Atherosclerotic plaque involving the carotid arteries. Estimated degree of stenosis in the right internal carotid artery is less than 50%. 3. Patent vertebral arteries with antegrade flow. Electronically Signed   By: Richarda Overlie M.D.   On: 02/23/2020 14:42   PERIPHERAL VASCULAR CATHETERIZATION  Result Date: 02/21/2020 See op note  CT CEREBRAL PERFUSION W CONTRAST  Result Date: 02/23/2020 CLINICAL DATA:  Neuro deficit, acute, stroke suspected. EXAM: CT ANGIOGRAPHY HEAD AND NECK CT PERFUSION BRAIN TECHNIQUE: Multidetector CT imaging of the head and neck was performed using the standard protocol during bolus administration of intravenous contrast. Multiplanar CT image reconstructions and MIPs were obtained to evaluate the vascular anatomy. Carotid stenosis measurements (when applicable) are obtained utilizing NASCET criteria, using the distal internal carotid diameter as the denominator. Multiphase CT imaging of the brain was  performed following IV bolus contrast injection. Subsequent parametric perfusion maps were calculated using RAPID software. CONTRAST:  OMNIPAQUE IOHEXOL 350 MG/ML SOLN COMPARISON:  Noncontrast head CT performed earlier the same day. FINDINGS: CTA NECK FINDINGS Aortic arch: The left vertebral artery arises directly from the aortic arch. Atherosclerotic plaque within the visualized aortic arch and proximal major branch vessels of the neck. No hemodynamically significant innominate or proximal subclavian artery stenosis. Right carotid system: The CCA and ICA are patent within the neck. Atherosclerotic plaque within these vessels. Most notably there is at least moderate calcified plaque within the carotid bifurcation. Exact quantification of stenosis within the proximal right ICA is difficult due to motion artifact and blooming artifact of calcified plaque at this site. Additionally, there is 50-60% stenosis of the mid cervical ICA. Left carotid system: The CCA is patent to the bifurcation. 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 Vertebral arteries: The dominant right vertebral artery is patent throughout the neck. Moderate/severe atherosclerotic narrowing within the V1 right vertebral artery. The non dominant left vertebral artery is developmentally diminutive, but patent throughout the neck Skeleton: Reversal of the expected cervical lordosis. Cervical spondylosis with multilevel disc space narrowing, posterior disc osteophytes and uncovertebral hypertrophy. Other neck: No neck mass or cervical lymphadenopathy. Upper chest: No consolidation within the imaged lung apices. 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 There is minimal reconstitution of flow  within the supraclinoid left ICA. The M1 and M2 left middle cerebral arteries are patent, although with asymmetrically decreased opacification as compared to the right side. Additionally, there is a moderate to moderately severe focal stenosis within a superior division proximal M2 left MCA branch (series 8, image 17). The intracranial right internal carotid artery is patent. However, there are sites of severe stenosis within the cavernous and paraclinoid segments. The M1 right middle cerebral artery  is patent without significant stenosis. No right M2 proximal branch occlusion is identified. There is a moderate to moderately severe stenosis within an inferior division mid M2 right MCA branch vessel (series 9, image 19). The A1 left anterior cerebral artery is hypoplastic. The A1 right anterior cerebral artery is dominant. The A2 and more distal anterior cerebral arteries are patent. However, there are multifocal high-grade stenoses within the bilateral A2 anterior cerebral arteries. Posterior circulation: The non dominant intracranial left vertebral artery is developmentally diminutive, but patent and terminates as the left PICA. The dominant intracranial right vertebral artery is patent, although with sites of moderate stenosis. The basilar artery is patent. The posterior cerebral arteries are patent. However, there is a high-grade focal stenosis within the P2 left PCA and high-grade focal stenosis within the right PCA at the P2/P3 junction. Venous sinuses: Within limitations of contrast timing, no convincing thrombus. Anatomic variants: Posterior communicating arteries are hypoplastic or absent bilaterally. Review of the MIP images confirms the above findings CT Brain Perfusion Findings: ASPECTS: 4 CBF (<30%) Volume: 34mL (left MCA vascular territory) Perfusion (Tmax>6.0s) volume: (left MCA vascular territory) Mismatch Volume: 67mL Infarction Location:Left MCA vascular territory. These results were called by  telephone at the time of interpretation on 02/23/2020 at 12:40 pm to provider [facility] , who verbally acknowledged these results. IMPRESSION: CTA neck: 1. 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 2. There is at least moderate calcified plaque at the right carotid bifurcation. There is stenosis at the origin of the cervical right vertebral artery which is poorly quantified due to motion degradation and blooming artifact from calcified plaque at this site. Carotid artery duplex is recommended for further evaluation. Additionally, there is apparent 50-60% stenosis of the mid cervical right ICA. 3. Moderate/severe atherosclerotic narrowing within the Dom V1 right vertebral artery. 4. The left vertebral artery is developmentally diminutive, but patent throughout the neck. CTA head: 1. The left internal carotid artery remains occluded intracranially throughout the siphon region. There is minimal reconstitution of flow within the supraclinoid left ICA. The M1 and M2 left middle cerebral arteries are patent, although with asymmetrically decreased opacification as compared to the right side. Additionally, there is a moderate to moderately severe focal stenosis within a superior division proximal M2 left MCA branch. 2. Additional intracranial atherosclerotic disease with multifocal stenoses, most notably as follows. 3. High-grade stenoses within the cavernous/paraclinoid right ICA. 4. Moderate to moderately severe focal stenosis within an inferior division mid M2 right MCA branch vessel. 5. Moderate stenosis within the V4 right vertebral artery. 6. High-grade stenosis within the P2 left PCA. 7. High-grade stenosis within the right PCA at the P2/P3 junction. 8. Multifocal high-grade stenoses within the A2 anterior cerebral arteries bilaterally. CT perfusion head: The perfusion software identifies an 89 mL core infarct within the left MCA vascular  territory. The perfusion software identifies a 149 mL region of critically hypoperfused parenchyma within the left MCA vascular territory. Reported mismatch volume 60 mL. Electronically Signed   By: Jackey Loge DO   On: 02/23/2020 12:53   DG CHEST PORT 1 VIEW  Result Date: 02/24/2020 CLINICAL DATA:  59 year old male with shortness of breath. EXAM: PORTABLE CHEST 1 VIEW COMPARISON:  Chest radiograph dated 02/22/2020. FINDINGS: Status post removal of the endotracheal and enteric tube. There is diffuse interstitial densities which may be chronic or represent edema or atypical pneumonia. No large pleural effusion. No pneumothorax. Stable cardiac silhouette.  No acute osseous pathology. IMPRESSION: Interval removal of the endotracheal and enteric tubes. Electronically Signed   By: Elgie Collard M.D.   On: 02/24/2020 19:11   Portable Chest x-ray  Result Date: 02/22/2020 CLINICAL DATA:  Left cerebral artery and polyp stroke, postop bilateral iliofemoral thrombectomy, ETT EXAM: PORTABLE CHEST 1 VIEW COMPARISON:  01/12/2013 FINDINGS: Lungs are essentially clear.  No pleural effusion or pneumothorax. The heart is normal in size. Endotracheal tube terminates 5 cm above the carina. Enteric tube terminates in the proximal gastric body. IMPRESSION: Endotracheal tube terminates 5 cm above the carina. Enteric tube terminates in the proximal gastric body. Electronically Signed   By: Charline Bills M.D.   On: 02/22/2020 08:54   DG Swallowing Func-Speech Pathology  Result Date: 02/24/2020 Objective Swallowing Evaluation: Type of Study: MBS-Modified Barium Swallow Study  Patient Details Name: AMADOR BRADDY MRN: [id number] Date of Birth: 20-Dec-1960 Today's Date: 02/24/2020 Time: SLP Start Time (ACUTE ONLY): 1310 -SLP Stop Time (ACUTE ONLY): 1340 SLP Time Calculation (min) (ACUTE ONLY): 30 min Past Medical History: Past Medical History: Diagnosis Date . Aphasia S/P CVA  . DDD (degenerative disc disease), cervical  . ED  (erectile dysfunction)  . Hyperlipidemia  . Hypertension  . Peripheral arterial disease (HCC)  . Stroke [facility])  Past Surgical History: Past Surgical History: Procedure Laterality Date . FEMORAL-FEMORAL BYPASS GRAFT Bilateral 02/22/2020  Procedure: bilateral ileofemoral thrombectomy, left popliteal exposure and thrombectomy, four compartment fasciotomy, left femoral endarterectomy with patch angioplasty;  Surgeon: Nada Libman, MD;  Location: [facility] ORS;  Service: Vascular;  Laterality: Bilateral; . LOWER EXTREMITY ANGIOGRAPHY Left 02/21/2020  Procedure: LOWER EXTREMITY ANGIOGRAPHY;  Surgeon: Renford Dills, MD;  Location: [facility] INVASIVE CV LAB;  Service: Cardiovascular;  Laterality: Left; . PERIPHERAL VASCULAR CATHETERIZATION N/A 07/21/2015  Procedure: Abdominal Aortogram w/Lower Extremity;  Surgeon: Renford Dills, MD;  Location: [facility] INVASIVE CV LAB;  Service: Cardiovascular;  Laterality: N/A; . PERIPHERAL VASCULAR CATHETERIZATION  07/21/2015  Procedure: Lower Extremity Intervention;  Surgeon: Renford Dills, MD;  Location: [facility] INVASIVE CV LAB;  Service: Cardiovascular;; HPI: 59 y.o. male with a history of hypertension, hyperlipidemia, CVA, severe PAD  who presented with acute critical limb ischemia secondary to occlusion of the iliac artery who underwent stenting on 8/6 at [facility].  He was taken back to the OR on 8/7 found to have acute thrombosis of bilateral iliac stents underwent thrombectomy and endarterectomy;  fasciotomies of the left leg. Extubated 8/8. Developed right hemiplegia and aphasia morning of 8/8. CTH: large territory infarct left MCA. Pt transferred to [facility] same day.   Subjective: alert, aphasic Assessment / Plan / Recommendation CHL IP CLINICAL IMPRESSIONS 02/24/2020 Clinical Impression Pt presents with a primary oral dysphagia c/b impaired bolus cohesion and control over release into the pharynx.  There was decreased sensation on the right, with barium residue in right lateral and anterior sulci  and limited awareness of its presence.  Pharyngeal function was largely intact, with adequate pharyngeal squeeze, no residue post-swallow, and effective laryngeal vestibule closure.  Occasionally, thin and nectar thick barium reached the pyriforms for a delayed period of time before the swallow was triggered.  On other occasions, the swallow trigger was timely.  There was one incident of penetration to the vocal folds when a bolus of nectar thick liquid prematurely escaped the oral cavity and entered the laryngeal vestibule without eliciting a response.  Pt's impaired sensation appears to be the primary concern and puts him at greater risk of aspiration.  For now, recommend starting a dysphagia 1 diet, thin liquids from cup only - NO STRAWS; give meds whole in applesauce/pudding.  Due to pt's impulsivity, difficulty following commands, and poor sensation/awareness on right side of mouth/throat, he will require careful supervision during meals.  SLP will follow for safety/diet progression and therapeutic training.   SLP Visit Diagnosis Dysphagia, oropharyngeal phase (R13.12) Attention and concentration deficit following -- Frontal lobe and executive function deficit following -- Impact on safety and function Mild aspiration risk   CHL IP TREATMENT RECOMMENDATION 02/24/2020 Treatment Recommendations Therapy as outlined in treatment plan below   Prognosis 02/24/2020 Prognosis for Safe Diet Advancement Good Barriers to Reach Goals -- Barriers/Prognosis Comment -- CHL IP DIET RECOMMENDATION 02/24/2020 SLP Diet Recommendations Dysphagia 1 (Puree) solids;Thin liquid Liquid Administration via Cup;No straw Medication Administration Whole meds with puree Compensations Minimize environmental distractions;Slow rate;Small sips/bites Postural Changes --   CHL IP OTHER RECOMMENDATIONS 02/24/2020 Recommended Consults -- Oral Care Recommendations Oral care BID Other Recommendations --   CHL IP FOLLOW UP RECOMMENDATIONS 02/24/2020 Follow up  Recommendations Inpatient Rehab   CHL IP FREQUENCY AND DURATION 02/24/2020 Speech Therapy Frequency (ACUTE ONLY) min 3x week Treatment Duration 2 weeks      CHL IP ORAL PHASE 02/24/2020 Oral Phase Impaired Oral - Pudding Teaspoon -- Oral - Pudding Cup -- Oral - Honey Teaspoon -- Oral - Honey Cup -- Oral - Nectar Teaspoon -- Oral - Nectar Cup -- Oral - Nectar Straw Right anterior bolus loss;Weak lingual manipulation;Reduced posterior propulsion;Holding of bolus;Right pocketing in lateral sulci;Pocketing in anterior sulcus;Lingual/palatal residue;Piecemeal swallowing;Decreased bolus cohesion Oral - Thin Teaspoon -- Oral - Thin Cup Right anterior bolus loss;Weak lingual manipulation;Reduced posterior propulsion;Holding of bolus;Right pocketing in lateral sulci;Pocketing in anterior sulcus;Lingual/palatal residue;Piecemeal swallowing;Decreased bolus cohesion Oral - Thin Straw Right anterior bolus loss;Weak lingual manipulation;Reduced posterior propulsion;Holding of bolus;Right pocketing in lateral sulci;Pocketing in anterior sulcus;Lingual/palatal residue;Piecemeal swallowing;Decreased bolus cohesion Oral - Puree Right anterior bolus loss;Weak lingual manipulation;Reduced posterior propulsion;Holding of bolus;Right pocketing in lateral sulci;Pocketing in anterior sulcus;Lingual/palatal residue;Piecemeal swallowing;Decreased bolus cohesion Oral - Mech Soft -- Oral - Regular Right anterior bolus loss;Weak lingual manipulation;Reduced posterior propulsion;Holding of bolus;Right pocketing in lateral sulci;Pocketing in anterior sulcus;Lingual/palatal residue;Piecemeal swallowing;Decreased bolus cohesion Oral - Multi-Consistency -- Oral - Pill -- Oral Phase - Comment --  CHL IP PHARYNGEAL PHASE 02/24/2020 Pharyngeal Phase Impaired Pharyngeal- Pudding Teaspoon -- Pharyngeal -- Pharyngeal- Pudding Cup -- Pharyngeal -- Pharyngeal- Honey Teaspoon -- Pharyngeal -- Pharyngeal- Honey Cup -- Pharyngeal -- Pharyngeal- Nectar Teaspoon --  Pharyngeal -- Pharyngeal- Nectar Cup -- Pharyngeal -- Pharyngeal- Nectar Straw Delayed swallow initiation-pyriform sinuses;Delayed swallow initiation-vallecula;Penetration/Aspiration before swallow Pharyngeal Material enters airway, CONTACTS cords and not ejected out Pharyngeal- Thin Teaspoon -- Pharyngeal -- Pharyngeal- Thin Cup Delayed swallow initiation-pyriform sinuses;Delayed swallow initiation-vallecula Pharyngeal -- Pharyngeal- Thin Straw Delayed swallow initiation-vallecula;Delayed swallow initiation-pyriform sinuses Pharyngeal -- Pharyngeal- Puree Delayed swallow initiation-vallecula;Delayed swallow initiation-pyriform sinuses Pharyngeal -- Pharyngeal- Mechanical Soft -- Pharyngeal -- Pharyngeal- Regular Delayed swallow initiation-vallecula Pharyngeal -- Pharyngeal- Multi-consistency -- Pharyngeal -- Pharyngeal- Pill -- Pharyngeal -- Pharyngeal Comment --  CHL IP CERVICAL ESOPHAGEAL PHASE 02/24/2020 Cervical Esophageal Phase WFL Pudding Teaspoon -- Pudding Cup -- Honey Teaspoon -- Honey Cup -- Nectar Teaspoon -- Nectar Cup -- Nectar Straw -- Thin Teaspoon -- Thin Cup -- Thin Straw -- Puree -- Mechanical Soft -- Regular -- Multi-consistency -- Pill -- Cervical Esophageal Comment -- Blenda Mounts Laurice 02/24/2020, 2:58 PM  ECHOCARDIOGRAM COMPLETE  Result Date: 02/24/2020    ECHOCARDIOGRAM REPORT   Patient Name:   DORYAN BAHL Date of Exam: 02/24/2020 Medical Rec #:  [id number]          Height:       67.0 in Accession #:    [id number]         Weight:       175.5 lb Date of Birth:  Jun 11, 1961          BSA:          1.913 m Patient Age:    59 years           BP:           159/96 mmHg Patient Gender: M                  HR:           106 bpm. Exam Location:  Inpatient Procedure: 2D Echo and Intracardiac Opacification Agent Indications:    Stroke 434.91 / I163.9  History:        Patient has no prior history of Echocardiogram examinations.                 Peripheral arterial disease, underwent  stenting, complicated by                 thrombosis requiring thrombectomy and LLE fasciotomy. Developed                 left facial droop. 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  Sonographer:    Leta Jungling RDCS Referring Phys: [id number] Cyleigh Massaro IMPRESSIONS  1. Left ventricular ejection fraction, by estimation, is 60 to 65%. Left ventricular ejection fraction by 2D MOD biplane is 63.2 %. The left ventricle has normal function. The left ventricle has no regional wall motion abnormalities. Left ventricular diastolic parameters are indeterminate.  2. Right ventricular systolic function is normal. The right ventricular size is normal. There is normal pulmonary artery systolic pressure. The estimated right ventricular systolic pressure is 30.9 mmHg.  3. The mitral valve is grossly normal. No evidence of mitral valve regurgitation. No evidence of mitral stenosis.  4. The aortic valve is tricuspid. Aortic valve regurgitation is not visualized. No aortic stenosis is present.  5. The inferior vena cava is normal in size with greater than 50% respiratory variability, suggesting right atrial pressure of 3 mmHg. Conclusion(s)/Recommendation(s): No intracardiac source of embolism detected on this transthoracic study. A transesophageal echocardiogram is recommended to exclude cardiac source of embolism if clinically indicated. FINDINGS  Left Ventricle: Left ventricular ejection fraction, by estimation, is 60 to 65%. Left ventricular ejection fraction by 2D MOD biplane is 63.2 %. The left ventricle has normal function. The left ventricle has no regional wall motion abnormalities. Definity contrast agent was given IV to delineate the left ventricular endocardial borders. The left ventricular internal cavity size was normal in size. There is no left ventricular hypertrophy. Left ventricular diastolic parameters are indeterminate. Right Ventricle: The right ventricular size is normal. No increase in right ventricular wall thickness. Right  ventricular systolic function is normal. There is normal pulmonary artery systolic pressure. The tricuspid regurgitant velocity is 2.64 m/s, and  with an assumed right atrial pressure of 3 mmHg, the estimated right ventricular systolic pressure is 30.9 mmHg. Left Atrium: Left atrial size was normal in size. Right Atrium: Right atrial size was normal in size. Pericardium: Trivial pericardial effusion is present. Presence of pericardial fat pad. Mitral Valve:  The mitral valve is grossly normal. No evidence of mitral valve regurgitation. No evidence of mitral valve stenosis. Tricuspid Valve: The tricuspid valve is grossly normal. Tricuspid valve regurgitation is trivial. No evidence of tricuspid stenosis. Aortic Valve: The aortic valve is tricuspid. Aortic valve regurgitation is not visualized. No aortic stenosis is present. Pulmonic Valve: The pulmonic valve was grossly normal. Pulmonic valve regurgitation is not visualized. No evidence of pulmonic stenosis. Aorta: The aortic root is normal in size and structure. Venous: The inferior vena cava is normal in size with greater than 50% respiratory variability, suggesting right atrial pressure of 3 mmHg. IAS/Shunts: The atrial septum is grossly normal.  LEFT VENTRICLE PLAX 2D                        Biplane EF (MOD) LVIDd:         4.90 cm         LV Biplane EF:   Left LVIDs:         3.60 cm                          ventricular LV PW:         0.90 cm                          ejection LV IVS:        1.10 cm                          fraction by LVOT diam:     1.90 cm                          2D MOD LV SV:         43                               biplane is LV SV Index:   22                               63.2 %. LVOT Area:     2.84 cm                                Diastology                                LV e' lateral:   8.93 cm/s LV Volumes (MOD)               LV E/e' lateral: 11.4 LV vol d, MOD    98.1 ml       LV e' medial:    6.08 cm/s A2C:                           LV  E/e' medial:  16.8 LV vol d, MOD    88.1 ml A4C: LV vol s, MOD    53.3 ml A2C: LV vol s, MOD    23.4 ml A4C: LV SV MOD A2C:   44.8 ml LV SV MOD A4C:   88.1 ml LV SV  MOD BP:    60.6 ml RIGHT VENTRICLE RV S prime:     11.40 cm/s TAPSE (M-mode): 1.9 cm LEFT ATRIUM             Index       RIGHT ATRIUM           Index LA diam:        3.90 cm 2.04 cm/m  RA Area:     13.40 cm LA Vol (A2C):   50.2 ml 26.24 ml/m RA Volume:   34.30 ml  17.93 ml/m LA Vol (A4C):   63.9 ml 33.40 ml/m LA Biplane Vol: 58.4 ml 30.53 ml/m  AORTIC VALVE LVOT Vmax:   88.30 cm/s LVOT Vmean:  53.100 cm/s LVOT VTI:    0.151 m  AORTA Ao Root diam: 3.30 cm MITRAL VALVE                TRICUSPID VALVE MV Area (PHT): 7.16 cm     TR Peak grad:   27.9 mmHg MV Decel Time: 106 msec     TR Vmax:        264.00 cm/s MV E velocity: 102.00 cm/s MV A velocity: 49.20 cm/s   SHUNTS MV E/A ratio:  2.07         Systemic VTI:  0.15 m                             Systemic Diam: 1.90 cm Lennie OdorWesley O'Neal MD Electronically signed by Lennie OdorWesley O'Neal MD Signature Date/Time: 02/24/2020/1:09:12 PM    Final    CT HEAD CODE STROKE WO CONTRAST  Result Date: 02/23/2020 CLINICAL DATA:  Code stroke. Neuro deficit, acute, stroke suspected. Mental status change at 11 a.m., last known well 8 a.m. this morning. EXAM: CT HEAD WITHOUT CONTRAST TECHNIQUE: Contiguous axial images were obtained from the base of the skull through the vertex without intravenous contrast. COMPARISON:  Brain MRI 09/17/2018 FINDINGS: Brain: There is a large acute demarcated ischemic infarct within the left MCA vascular territory affecting the left frontal, temporal and parietal lobes as well as left insula and subinsular region. The left caudate and lentiform nuclei are also affected. No evidence of hemorrhagic conversion. There is mild associated mass effect with 3 mm rightward midline shift. Redemonstrated superimposed small chronic infarcts within the left cerebral hemisphere. Background mild generalized  parenchymal atrophy and chronic small vessel ischemic disease. No extra-axial fluid collection. No evidence of intracranial mass. Vascular: No hyperdense vessel Skull: Atherosclerotic calcifications. Sinuses/Orbits: Visualized orbits show no acute finding. Mild ethmoid sinus mucosal thickening. No significant mastoid effusion. ASPECTS [facility]([place] Stroke Program Early CT Score) - Ganglionic level infarction (caudate, lentiform nuclei, internal capsule, insula, M1-M3 cortex): 2 - Supraganglionic infarction (M4-M6 cortex): 2 Total score (0-10 with 10 being normal): 4 These results were called by telephone at the time of interpretation on 02/23/2020 at 12:20 pm to provider Erick BlinksSalman Khaliqdina, who verbally acknowledged these results. IMPRESSION: Large acute demarcated left MCA vascular territory as described. ASPECTS is 4. No evidence of hemorrhagic conversion. Mild mass effect with 3 mm rightward midline shift. Stable background mild generalized parenchymal atrophy and chronic small vessel ischemic disease. Superimposed small chronic infarcts within the left cerebral hemisphere. Electronically Signed   By: Jackey LogeKyle  Golden DO   On: 02/23/2020 12:22   CT ANGIO HEAD CODE STROKE  Result Date: 02/23/2020 CLINICAL DATA:  Neuro deficit, acute, stroke suspected. EXAM: CT ANGIOGRAPHY HEAD AND NECK CT PERFUSION BRAIN  TECHNIQUE: Multidetector CT imaging of the head and neck was performed using the standard protocol during bolus administration of intravenous contrast. Multiplanar CT image reconstructions and MIPs were obtained to evaluate the vascular anatomy. Carotid stenosis measurements (when applicable) are obtained utilizing NASCET criteria, using the distal internal carotid diameter as the denominator. Multiphase CT imaging of the brain was performed following IV bolus contrast injection. Subsequent parametric perfusion maps were calculated using RAPID software. CONTRAST:  OMNIPAQUE IOHEXOL 350 MG/ML SOLN COMPARISON:   Noncontrast head CT performed earlier the same day. FINDINGS: CTA NECK FINDINGS Aortic arch: The left vertebral artery arises directly from the aortic arch. Atherosclerotic plaque within the visualized aortic arch and proximal major branch vessels of the neck. No hemodynamically significant innominate or proximal subclavian artery stenosis. Right carotid system: The CCA and ICA are patent within the neck. Atherosclerotic plaque within these vessels. Most notably there is at least moderate calcified plaque within the carotid bifurcation. Exact quantification of stenosis within the proximal right ICA is difficult due to motion artifact and blooming artifact of calcified plaque at this site. Additionally, there is 50-60% stenosis of the mid cervical ICA. Left carotid system: The CCA is patent to the bifurcation. 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 Vertebral arteries: The dominant right vertebral artery is patent throughout the neck. Moderate/severe atherosclerotic narrowing within the V1 right vertebral artery. The non dominant left vertebral artery is developmentally diminutive, but patent throughout the neck Skeleton: Reversal of the expected cervical lordosis. Cervical spondylosis with multilevel disc space narrowing, posterior disc osteophytes and uncovertebral hypertrophy. Other neck: No neck mass or cervical lymphadenopathy. Upper chest: No consolidation within the imaged lung apices. 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 There is minimal reconstitution of flow within the supraclinoid left ICA. The M1 and M2 left middle cerebral arteries are patent, although with asymmetrically decreased opacification as compared to the right side. Additionally,  there is a moderate to moderately severe focal stenosis within a superior division proximal M2 left MCA branch (series 8, image 17). The intracranial right internal carotid artery is patent. However, there are sites of severe stenosis within the cavernous and paraclinoid segments. The M1 right middle cerebral artery is patent without significant stenosis. No right M2 proximal branch occlusion is identified. There is a moderate to moderately severe stenosis within an inferior division mid M2 right MCA branch vessel (series 9, image 19). The A1 left anterior cerebral artery is hypoplastic. The A1 right anterior cerebral artery is dominant. The A2 and more distal anterior cerebral arteries are patent. However, there are multifocal high-grade stenoses within the bilateral A2 anterior cerebral arteries. Posterior circulation: The non dominant intracranial left vertebral artery is developmentally diminutive, but patent and terminates as the left PICA. The dominant intracranial right vertebral artery is patent, although with sites of moderate stenosis. The basilar artery is patent. The posterior cerebral arteries are patent. However, there is a high-grade focal stenosis within the P2 left PCA and high-grade focal stenosis within the right PCA at the P2/P3 junction. Venous sinuses: Within limitations of contrast timing, no convincing thrombus. Anatomic variants: Posterior communicating arteries are hypoplastic or absent bilaterally. Review of the MIP images confirms the above findings CT Brain Perfusion Findings: ASPECTS: 4 CBF (<30%) Volume: 89mL (left  MCA vascular territory) Perfusion (Tmax>6.0s) volume: (left MCA vascular territory) Mismatch Volume: 60mL Infarction Location:Left MCA vascular territory. These results were called by telephone at the time of interpretation on 02/23/2020 at 12:40 pm to provider [facility] , who verbally acknowledged these results. IMPRESSION: CTA neck: 1. The cervical left ICA is  occluded from its origin and remains occluded throughout the remainder of the neck. 2. There is at least moderate calcified plaque at the right carotid bifurcation. There is stenosis at the origin of the cervical right vertebral artery which is poorly quantified due to motion degradation and blooming artifact from calcified plaque at this site. Carotid artery duplex is recommended for further evaluation. Additionally, there is apparent 50-60% stenosis of the mid cervical right ICA. 3. Moderate/severe atherosclerotic narrowing within the Dom V1 right vertebral artery. 4. The left vertebral artery is developmentally diminutive, but patent throughout the neck. CTA head: 1. The left internal carotid artery remains occluded intracranially throughout the siphon region. There is minimal reconstitution of flow within the supraclinoid left ICA. The M1 and M2 left middle cerebral arteries are patent, although with asymmetrically decreased opacification as compared to the right side. Additionally, there is a moderate to moderately severe focal stenosis within a superior division proximal M2 left MCA branch. 2. Additional intracranial atherosclerotic disease with multifocal stenoses, most notably as follows. 3. High-grade stenoses within the cavernous/paraclinoid right ICA. 4. Moderate to moderately severe focal stenosis within an inferior division mid M2 right MCA branch vessel. 5. Moderate stenosis within the V4 right vertebral artery. 6. High-grade stenosis within the P2 left PCA. 7. High-grade stenosis within the right PCA at the P2/P3 junction. 8. Multifocal high-grade stenoses within the A2 anterior cerebral arteries bilaterally. CT perfusion head: The perfusion software identifies an 89 mL core infarct within the left MCA vascular territory. The perfusion software identifies a 149 mL region of critically hypoperfused parenchyma within the left MCA vascular territory. Reported mismatch volume 60 mL. Electronically Signed    By: Jackey Loge DO   On: 02/23/2020 12:53   CT ANGIO NECK CODE STROKE  Result Date: 02/23/2020 CLINICAL DATA:  Neuro deficit, acute, stroke suspected. EXAM: CT ANGIOGRAPHY HEAD AND NECK CT PERFUSION BRAIN TECHNIQUE: Multidetector CT imaging of the head and neck was performed using the standard protocol during bolus administration of intravenous contrast. Multiplanar CT image reconstructions and MIPs were obtained to evaluate the vascular anatomy. Carotid stenosis measurements (when applicable) are obtained utilizing NASCET criteria, using the distal internal carotid diameter as the denominator. Multiphase CT imaging of the brain was performed following IV bolus contrast injection. Subsequent parametric perfusion maps were calculated using RAPID software. CONTRAST:  OMNIPAQUE IOHEXOL 350 MG/ML SOLN COMPARISON:  Noncontrast head CT performed earlier the same day. FINDINGS: CTA NECK FINDINGS Aortic arch: The left vertebral artery arises directly from the aortic arch. Atherosclerotic plaque within the visualized aortic arch and proximal major branch vessels of the neck. No hemodynamically significant innominate or proximal subclavian artery stenosis. Right carotid system: The CCA and ICA are patent within the neck. Atherosclerotic plaque within these vessels. Most notably there is at least moderate calcified plaque within the carotid bifurcation. Exact quantification of stenosis within the proximal right ICA is difficult due to motion artifact and blooming artifact of calcified plaque at this site. Additionally, there is 50-60% stenosis of the mid cervical ICA. Left carotid system: The CCA is patent to the bifurcation. The cervical left ICA is occluded from its origin and remains occluded to  the skull base. Vertebral arteries: The dominant right vertebral artery is patent throughout the neck. Moderate/severe atherosclerotic narrowing within the V1 right vertebral artery. The non dominant left vertebral artery  is developmentally diminutive, but patent throughout the neck Skeleton: Reversal of the expected cervical lordosis. Cervical spondylosis with multilevel disc space narrowing, posterior disc osteophytes and uncovertebral hypertrophy. Other neck: No neck mass or cervical lymphadenopathy. Upper chest: No consolidation within the imaged lung apices. 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 There is minimal reconstitution of flow within the supraclinoid left ICA. The M1 and M2 left middle cerebral arteries are patent, although with asymmetrically decreased opacification as compared to the right side. Additionally, there is a moderate to moderately severe focal stenosis within a superior division proximal M2 left MCA branch (series 8, image 17). The intracranial right internal carotid artery is patent. However, there are sites of severe stenosis within the cavernous and paraclinoid segments. The M1 right middle cerebral artery is patent without significant stenosis. No right M2 proximal branch occlusion is identified. There is a moderate to moderately severe stenosis within an inferior division mid M2 right MCA branch vessel (series 9, image 19). The A1 left anterior cerebral artery is hypoplastic. The A1 right anterior cerebral artery is dominant. The A2 and more distal anterior cerebral arteries are patent. However, there are multifocal high-grade stenoses within the bilateral A2 anterior cerebral arteries. Posterior circulation: The non dominant intracranial left vertebral artery is developmentally diminutive, but patent and terminates as the left PICA. The dominant intracranial right vertebral artery is patent, although with sites of moderate stenosis. The basilar artery is patent. The posterior cerebral  arteries are patent. However, there is a high-grade focal stenosis within the P2 left PCA and high-grade focal stenosis within the right PCA at the P2/P3 junction. Venous sinuses: Within limitations of contrast timing, no convincing thrombus. Anatomic variants: Posterior communicating arteries are hypoplastic or absent bilaterally. Review of the MIP images confirms the above findings CT Brain Perfusion Findings: ASPECTS: 4 CBF (<30%) Volume: 89mL (left MCA vascular territory) Perfusion (Tmax>6.0s) volume: (left MCA vascular territory) Mismatch Volume: 60mL Infarction Location:Left MCA vascular territory. These results were called by telephone at the time of interpretation on 02/23/2020 at 12:40 pm to provider [facility] , who verbally acknowledged these results. IMPRESSION: CTA neck: 1. 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 2. There is at least moderate calcified plaque at the right carotid bifurcation. There is stenosis at the origin of the cervical right vertebral artery which is poorly quantified due to motion degradation and blooming artifact from calcified plaque at this site. Carotid artery duplex is recommended for further evaluation. Additionally, there is apparent 50-60% stenosis of the mid cervical right ICA. 3. Moderate/severe atherosclerotic narrowing within the Dom V1 right vertebral artery. 4. The left vertebral artery is developmentally diminutive, but patent throughout the neck. CTA head: 1. The left internal carotid artery remains occluded intracranially throughout the siphon region. There is minimal reconstitution of flow within the supraclinoid left ICA. The M1 and M2 left middle cerebral arteries are patent, although with asymmetrically decreased opacification as compared to the right side. Additionally, there is a moderate to moderately severe focal stenosis within a superior division proximal M2 left MCA branch.  2. Additional intracranial atherosclerotic disease with multifocal stenoses, most notably as follows. 3. High-grade stenoses within the cavernous/paraclinoid right ICA. 4. Moderate to moderately severe focal stenosis within an inferior division mid M2 right MCA branch vessel. 5. Moderate stenosis within the V4 right vertebral artery. 6. High-grade stenosis within the P2 left PCA. 7. High-grade stenosis within the right PCA at the P2/P3 junction. 8. Multifocal high-grade stenoses within the A2 anterior cerebral arteries bilaterally. CT perfusion head: The perfusion software identifies an 89 mL core infarct within the left MCA vascular territory. The perfusion software identifies a 149 mL region of critically hypoperfused parenchyma within the left MCA vascular territory. Reported mismatch volume 60 mL. Electronically Signed   By: Jackey Loge DO   On: 02/23/2020 12:53    PHYSICAL EXAM  Temp:  [97.8 F (36.6 C)-99.7 F (37.6 C)] 99.5 F (37.5 C) (08/10 1200) Pulse Rate:  [33-161] 77 (08/10 1430) Resp:  [18-38] 20 (08/10 1430) BP: (126-225)/(52-122) 147/74 (08/10 1430) SpO2:  [89 %-100 %] 96 % (08/10 1430) Arterial Line BP: (126)/(105) 126/105 (08/09 1813)  General - Well nourished, well developed, in no apparent distress, but upset about his language. Gurgling sound in the throat   Ophthalmologic - fundi not visualized due to noncooperation.  Cardiovascular - Regular rhythm and rate, not in afib.  Neuro - awake alert, still global aphasia with word salad. Only able to close eyes on command, but then perseverated on it. Not able to follow peripheral commands. Eyes left gaze preference but able to cross midline, right gaze incomplete. Blinking to visual threat on the left but not on the right. Right significant facial droop. Tongue midline in mouth. LUE 5/5, RUE flaccid. LLE 3/5 and RLE 3-/5 proximally. Not moving toes bilaterally. Sensation, coordination not cooperative and gait not  tested.   ASSESSMENT/PLAN Mr. NOLTON DENIS is a 59 y.o. male with history of hypertension, hyperlipidemia, smoker, stroke in 09/2019 admitted for bilateral acute limb ischemia status post stenting with reocclusion status post thrombectomy and endarterectomy as well as left leg fasciotomy.  Developed aphasia and right hemiplegia.  CT showed left large MCA infarct.  Transferred to [facility] for further evaluation. No tPA given due to on argatroban and established stroke.    Stroke:  left large MCA infarct due to left ICA occlusion most likely due to chronic left carotid artery stenosis/occlusion in the setting of operation (possible hypotension), but HIT needs to be ruled out  CT head left MCA large infarct  CTA head and neck left ICA occlusion, right ICA bulb moderate stenosis with mid cervical right ICA 50 to 60% stenosis.  High-grade stenosis right ICA siphon.  Bilateral M2 moderate to severe focal stenosis.  Right V1 and V4 moderate to severe stenosis.  High-grade stenosis bilateral P2 and A2.  Carotid Doppler left ICA occluded at origin, right ICA less than 50% stenosis.  Repeat CT left MCA infarct increased size and surrounding edema  2D Echo EF 60 to 65%  LDL 71  HgbA1c 6.2  lovenox for VTE prophylaxis   Argatroban prior to admission, now on aspirin 325 mg daily and clopidogrel 75 mg daily.   Patient counseled to be compliant with his antithrombotic medications  Ongoing aggressive stroke risk factor management  Therapy recommendations: Pending  Disposition: Pending  Cerebral edema  CT showed large left MCA infarct with mild midline shift  On 3% saline  Sodium goal 150-155  Na 143->152->153  Sodium check every 6 hours  CT repeat in  am - if still minimal MLS, will taper off 3%  HIT ruled out  Platelet [phone]  Was on argatroban, now off  HIT panel neg  On lovenox for DVT prophylaxis   Bilateral ischemic lower limb  S/p bilateral iliac artery  stents  Bilateral stent reocclusion -> bilateral thrombectomy and endarterectomy + as well as left LE fasciotomy  VVS on board  On DAPT  History of stroke  09/2019 presented with slurred speech and incoherent speech.  MRI showed left MCA scattered infarcts.  No MRI or CTA performed.  However from MRI T2 imaging concerning for left ICA stenosis  Patient otherwise for admission at that time for further stroke work-up however patient refused and signed AMA.  Home medication including aspirin 325, Plavix 75 and Lipitor 80, not sure compliance  No neurology follow-up noted  Hypertension . Stable at goal . Permissive hypertension (OK if <180/105) given recent surgery  Long term BP goal 130-150 given left ICA occlusion  Hyperlipidemia  Home meds: Lipitor 80  LDL 71, goal < 70  Now on Lipitor 80  Continue statin at discharge  Transaminitis  AST/ALT 203/46 -> 163/56  Continue to monitor  Okay to continue Lipitor at this time  Acute blood loss anemia  Hemoglobin 12.0-9.3-7.2-7.4-9.4->11.5  Close monitoring  Transfuse PRBC if hemoglobin less than 7  PAF Elevated troponin  Transient AF over night - now NSR  Trop 2259->2260->3089  No heparin IV needed at this time  Continue DAPT  Dysphagia Aspiration   Passed swallow  Now on dysphagia 1 diet and thin liquid  Speech on board  Overnight SOB likely due to aspiration  Tobacco abuse  Current smoker  Smoking cessation counseling provided  Nicotine patch provided  Pt is willing to quit  Other Stroke Risk Factors  Multifocal severe vasculopathy  Other Active Problems  Hypokalemia K 3.3->3.9->3.2 - supplement  Fever T-max 100.6-> afebrile  Hospital day # 2  This patient is critically ill due to large left MCA infarct, cerebral edema, bilateral lower extremity ischemic limb, transient A. fib, aspiration, elevated troponin and at significant risk of neurological worsening, death form recurrent  stroke, hemorrhagic conversion, brain herniation, sepsis, seizure, heart failure, respiratory failure, heart attack. This patient's care requires constant monitoring of vital signs, hemodynamics, respiratory and cardiac monitoring, review of multiple databases, neurological assessment, discussion with family, other specialists and medical decision making of high complexity. I spent 40 minutes of neurocritical care time in the care of this patient. I discussed with CCM resident.   Marvel Plan, MD PhD Stroke Neurology 02/25/2020 2:39 PM    To contact Stroke Continuity provider, please refer to [email or web address]. After hours, contact General Neurology

## 2020-02-25 NOTE — Progress Notes (Signed)
eLink Physician-Brief Progress Note Patient Name: ANTWOIN LACKEY DOB: Jun 22, 1961 MRN: [id number]   Date of Service  02/25/2020  HPI/Events of Note  Multiple issues: 1. K+ = 3.2 and Creatinine = 0.72 and 2. Troponin = 2259. Already on ASA. EKG reveals: Atrial fibrillation with rapid ventricular response. Marked ST abnormality, possible inferior subendocardial injury Marked ST abnormality, possible anterolateral subendocardial injury.  eICU Interventions  Plan: 1. Replace K+.  2. Metoprolol 2.5 mg IV now and Q 6 hours. Hold for SBP < 105 or HR < 60. 3. Heparin IV infusion per pharmacy consult.  4. Continue to Trend Troponin.      Intervention Category Major Interventions: Electrolyte abnormality - evaluation and management  Loanne Emery [place] 02/25/2020, 5:26 AM

## 2020-02-26 ENCOUNTER — Inpatient Hospital Stay (HOSPITAL_COMMUNITY): Payer: BC Managed Care – PPO

## 2020-02-26 DIAGNOSIS — I4821 Permanent atrial fibrillation: Secondary | ICD-10-CM

## 2020-02-26 DIAGNOSIS — I998 Other disorder of circulatory system: Secondary | ICD-10-CM

## 2020-02-26 LAB — CBC
HCT: 32.9 % — ABNORMAL LOW (ref 39.0–52.0)
Hemoglobin: 10.4 g/dL — ABNORMAL LOW (ref 13.0–17.0)
MCH: 30.9 pg (ref 26.0–34.0)
MCHC: 31.6 g/dL (ref 30.0–36.0)
MCV: 97.6 fL (ref 80.0–100.0)
Platelets: 187 10*3/uL (ref 150–400)
RBC: 3.37 MIL/uL — ABNORMAL LOW (ref 4.22–5.81)
RDW: 14.5 % (ref 11.5–15.5)
WBC: 9 10*3/uL (ref 4.0–10.5)
nRBC: 0.4 % — ABNORMAL HIGH (ref 0.0–0.2)

## 2020-02-26 LAB — BASIC METABOLIC PANEL
Anion gap: 11 (ref 5–15)
BUN: 15 mg/dL (ref 6–20)
CO2: 25 mmol/L (ref 22–32)
Calcium: 8.7 mg/dL — ABNORMAL LOW (ref 8.9–10.3)
Chloride: 118 mmol/L — ABNORMAL HIGH (ref 98–111)
Creatinine, Ser: 0.75 mg/dL (ref 0.61–1.24)
GFR calc Af Amer: >60 mL/min (ref >60)
GFR calc non Af Amer: >60 mL/min (ref >60)
Glucose, Bld: 253 mg/dL — ABNORMAL HIGH (ref 70–99)
Potassium: 2.7 mmol/L — CL (ref 3.5–5.1)
Sodium: 154 mmol/L — ABNORMAL HIGH (ref 135–145)

## 2020-02-26 LAB — SODIUM
Sodium: 156 mmol/L — ABNORMAL HIGH (ref 135–145)
Sodium: 159 mmol/L — ABNORMAL HIGH (ref 135–145)
Sodium: 157 mmol/L — ABNORMAL HIGH (ref 135–145)

## 2020-02-26 MED ORDER — LISINOPRIL 20 MG PO TABS
20.0000 mg | ORAL_TABLET | Freq: Every day | ORAL | Status: DC
  Administered 2020-02-26 – 2020-02-27 (×2): 20 mg via ORAL
  Filled 2020-02-26 (×2): qty 1

## 2020-02-26 MED ORDER — LISINOPRIL 20 MG PO TABS: 20.0000 mg | ORAL_TABLET | Freq: Two times a day (BID) | ORAL | Status: DC

## 2020-02-26 MED ORDER — LABETALOL HCL 5 MG/ML IV SOLN
5.0000 mg | INTRAVENOUS | Status: DC | PRN
  Administered 2020-02-27: 5 mg via INTRAVENOUS
  Filled 2020-02-26: qty 4

## 2020-02-26 MED ORDER — METOPROLOL TARTRATE 50 MG PO TABS
50.0000 mg | ORAL_TABLET | Freq: Three times a day (TID) | ORAL | Status: DC
  Administered 2020-02-26 (×3): 50 mg via ORAL
  Filled 2020-02-26 (×3): qty 1

## 2020-02-26 MED ORDER — SODIUM CHLORIDE 0.9 % IV SOLN: INTRAVENOUS | Status: DC

## 2020-02-26 MED ORDER — POTASSIUM CHLORIDE CRYS ER 20 MEQ PO TBCR
40.0000 meq | EXTENDED_RELEASE_TABLET | ORAL | Status: AC
  Administered 2020-02-26 (×3): 40 meq via ORAL
  Filled 2020-02-26 (×3): qty 2

## 2020-02-26 NOTE — Progress Notes (Addendum)
  Progress Note    02/26/2020 7:40 AM * No surgery found *  Subjective:  No complaints. Sitting up in bed eating breakfast. Says he is not in any pain   Vitals:   02/26/20 0715 02/26/20 0730  BP: (!) 185/89 (!) 165/87  Pulse: 78 87  Resp: (!) 25 (!) 30  Temp:    SpO2: 96% 97%   Physical Exam: Cardiac: regular Lungs:  tachypneic Incisions:  Right groin Prevena VAC with good seal; a lot of ecchymosis to scrotum right groin and lower right abdomen. Left groin clean, dry and intact. Left below knee incisions healing well, staples intact Extremities:  Bilateral lower extremities well perfused and warm. Palpable Dp and PT pulses bilaterally. Calves soft and non tender Neurologic: alert, responds appropriately  CBC    Component Value Date/Time   WBC 11.9 (H) 02/25/2020 0424   RBC 3.67 (L) 02/25/2020 0424   HGB 11.5 (L) 02/25/2020 0424   HGB 16.8 08/14/2012 1008   HCT 34.8 (L) 02/25/2020 0424   HCT 49.0 08/14/2012 1008   PLT 160 02/25/2020 0424   PLT 291 08/14/2012 1008   MCV 94.8 02/25/2020 0424   MCV 92 08/14/2012 1008   MCH 31.3 02/25/2020 0424   MCHC 33.0 02/25/2020 0424   RDW 14.1 02/25/2020 0424   RDW 13.5 08/14/2012 1008   LYMPHSABS 3.3 09/17/2019 0927   MONOABS 0.9 09/17/2019 0927   EOSABS 0.1 09/17/2019 0927   BASOSABS 0.1 09/17/2019 0927    BMET    Component Value Date/Time   NA 153 (H) 02/25/2020 1747   NA 138 08/14/2012 1008   K 3.2 (L) 02/25/2020 0424   K 3.9 08/14/2012 1008   CL 116 (H) 02/25/2020 0424   CL 103 08/14/2012 1008   CO2 22 02/25/2020 0424   CO2 27 08/14/2012 1008   GLUCOSE 155 (H) 02/25/2020 0424   GLUCOSE 84 08/14/2012 1008   BUN 10 02/25/2020 0424   BUN 10 08/14/2012 1008   CREATININE 0.72 02/25/2020 0424   CREATININE 0.70 08/14/2012 1008   CALCIUM 8.7 (L) 02/25/2020 0424   CALCIUM 9.3 08/14/2012 1008   GFRNONAA >60 02/25/2020 0424   GFRNONAA >60 08/14/2012 1008   GFRAA >60 02/25/2020 0424   GFRAA >60 08/14/2012 1008     INR    Component Value Date/Time   INR 1.1 02/22/2020 0210     Intake/Output Summary (Last 24 hours) at 02/26/2020 0740 Last data filed at 02/26/2020 0700 Gross per 24 hour  Intake 1722.19 ml  Output 2400 ml  Net -677.81 ml     Assessment/Plan:  60 y.o. male is s/p    #1: Bilateral iliofemoral thromboembolectomy #2: Left femoral endarterectomy with CorMatrix patch angioplasty #3: Left below-knee popliteal artery exposure and tibial thrombectomy #4: 4 compartment left leg fasciotomy #5: Bilateral groin Prevena wound VAC  Bilateral lower extremities are well perfused and warm with palpable DP and PT pulses bilaterally. Prevena wound VAC on right groin with good seal. No output. Left groin and lower extremity incisions are clean dry and intact. Healing well. Continue to keep dry gauze in left groin. On Aspirin and Plavix now  DVT prophylaxis:  lovenox  Graceann Congress, PA-C [facility] [phone] 02/26/2020 7:40 AM    I agree with the above.  I have seen and evaluated the patient.  We can remove his Prevena wound VACs on Friday  Durene Cal

## 2020-02-26 NOTE — Progress Notes (Signed)
STROKE TEAM PROGRESS NOTE   SUBJECTIVE (INTERVAL HISTORY) His RN and Dr. Merrily Pew are at the bedside. Pt stable overnight, still has intermittent tachycardia, on cardizem drip. EKGs showed PAF. Still has gurgly sound in the throat. Mild SOB. Still has right arm flaccid. Right LE 2/5 today. Repeat CT showed stable large left MCA infarct with 4-60mm midline shift. Na 154, will continue 3% for now. K 2.7, will supplement  OBJECTIVE Temp:  [97.7 F (36.5 C)-99.9 F (37.7 C)] 98.8 F (37.1 C) (08/11 0800) Pulse Rate:  [25-106] 82 (08/11 1130) Cardiac Rhythm: Normal sinus rhythm (08/11 0800) Resp:  [18-33] 24 (08/11 1130) BP: (137-191)/(62-102) 183/75 (08/11 1100) SpO2:  [89 %-99 %] 98 % (08/11 1130)  Recent Labs  Lab 02/23/20 0017 02/23/20 0346 02/23/20 0727 02/23/20 1128 02/23/20 1553  GLUCAP 164* 136* 130* 138* 146*   Recent Labs  Lab 02/23/20 0124 02/23/20 0124 02/23/20 1741 02/23/20 2113 02/24/20 0747 02/24/20 1341 02/25/20 0424 02/25/20 0630 02/25/20 1221 02/25/20 1747 02/26/20 0825  NA 138   < > 139   < > 145   < > 152* 153* 153* 153* 154*  156*  K 3.7  --  3.3*  --  3.9  --  3.2*  --   --   --  2.7*  CL 107  --  105  --  112*  --  116*  --   --   --  118*  CO2 24  --  24  --  23  --  22  --   --   --  25  GLUCOSE 158*  --  159*  --  135*  --  155*  --   --   --  253*  BUN 18  --  11  --  8  --  10  --   --   --  15  CREATININE 0.84  --  0.68  --  0.66  --  0.72  --   --   --  0.75  CALCIUM 7.4*   < > 7.9*   < > 8.0*  --  8.7*  --   --   --  8.7*  MG  --   --  1.9  --   --   --  2.2  --   --   --   --   PHOS  --   --  1.9*  --   --   --   --   --   --   --   --    < > = values in this interval not displayed.   Recent Labs  Lab 02/22/20 0210 02/23/20 1741 02/25/20 0424  AST 25 203* 163*  ALT 32 46* 56*  ALKPHOS 37* 34* 42  BILITOT 0.9 0.6 1.1  PROT 5.5* 4.6* 6.4*  ALBUMIN 3.2* 2.3* 2.8*   Recent Labs  Lab 02/23/20 0401 02/23/20 1741 02/24/20 1025  02/25/20 0424 02/26/20 0825  WBC 9.9 10.1 10.0 11.9* 9.0  HGB 7.2* 7.4* 9.4* 11.5* 10.4*  HCT 21.3* 22.3* 28.3* 34.8* 32.9*  MCV 94.2 93.3 94.0 94.8 97.6  PLT 126* PLATELET CLUMPS NOTED ON SMEAR, UNABLE TO ESTIMATE 117* 160 187   No results for input(s): CKTOTAL, CKMB, CKMBINDEX, TROPONINI in the last 168 hours. No results for input(s): LABPROT, INR in the last 72 hours. No results for input(s): COLORURINE, LABSPEC, PHURINE, GLUCOSEU, HGBUR, BILIRUBINUR, KETONESUR, PROTEINUR, UROBILINOGEN, NITRITE, LEUKOCYTESUR in the last 72 hours.  Invalid input(s): APPERANCEUR  Component Value Date/Time   CHOL 132 02/24/2020 0748   TRIG 129 02/24/2020 0748   HDL 35 (L) 02/24/2020 0748   CHOLHDL 3.8 02/24/2020 0748   VLDL 26 02/24/2020 0748   LDLCALC 71 02/24/2020 0748   Lab Results  Component Value Date   HGBA1C 6.2 (H) 02/23/2020   No results found for: LABOPIA, COCAINSCRNUR, LABBENZ, AMPHETMU, THCU, LABBARB  No results for input(s): ETH in the last 168 hours.  I have personally reviewed the radiological images below and agree with the radiology interpretations.  CT HEAD WO CONTRAST  Result Date: 02/26/2020 CLINICAL DATA:  Follow-up examination for acute stroke. EXAM: CT HEAD WITHOUT CONTRAST TECHNIQUE: Contiguous axial images were obtained from the base of the skull through the vertex without intravenous contrast. COMPARISON:  Prior CT from 02/24/2020. FINDINGS: Brain: Continued interval evolution of large left MCA territory infarct is seen, overall stable in size and distribution from previous. Associated regional mass effect with partial effacement of the left lateral ventricle and up to 5 mm of left-to-right shift, slightly worsened from previous. No hydrocephalus or ventricular trapping. Basilar cisterns remain patent. No evidence for hemorrhagic transformation. No other new acute large vessel territory infarct. No intracranial hemorrhage. No extra-axial fluid collection. Vascular: No  hyperdense vessel. Scattered vascular calcifications noted within the carotid siphons. Skull: Scalp soft tissues within normal limits. Calvarium unchanged. Sinuses/Orbits: Globes and orbital soft tissues demonstrate no acute finding. Paranasal sinuses and mastoid air cells remain largely clear. Other: None. IMPRESSION: 1. Continued interval evolution of large left MCA territory infarct, overall stable in size and distribution from previous. Associated regional mass effect with up to 5 mm of left-to-right shift, slightly worsened from previous. No hydrocephalus or ventricular trapping. No evidence for hemorrhagic transformation. 2. No other new acute intracranial abnormality. Electronically Signed   By: Rise Mu M.D.   On: 02/26/2020 02:07   CT HEAD WO CONTRAST  Result Date: 02/24/2020 CLINICAL DATA:  Stroke follow-up EXAM: CT HEAD WITHOUT CONTRAST TECHNIQUE: Contiguous axial images were obtained from the base of the skull through the vertex without intravenous contrast. COMPARISON:  02/23/2020 head CT. FINDINGS: Brain: Sequela of evolving left MCA territory infarct. 3-4 mm rightward midline shift is unchanged. No intracranial hemorrhage. No mass lesion. No ventriculomegaly or extra-axial fluid collection. Vascular: No hyperdense vessel. Bilateral skull base atherosclerotic calcifications. Skull: Negative for fracture or focal lesion. Sinuses/Orbits: Normal orbits. Soft tissue impacted left maxillary molar, unchanged. Mild right sphenoid sinus mucosal thickening. No mastoid effusion. Other: None. IMPRESSION: Evolving left MCA territory infarct with unchanged 3-4 mm rightward midline shift. No intracranial hemorrhage. Electronically Signed   By: Stana Bunting M.D.   On: 02/24/2020 09:29   CT HEAD WO CONTRAST  Result Date: 02/23/2020 CLINICAL DATA:  Stroke follow-up EXAM: CT HEAD WITHOUT CONTRAST TECHNIQUE: Contiguous axial images were obtained from the base of the skull through the vertex  without intravenous contrast. COMPARISON:  Head CT 02/21/2020 FINDINGS: Brain: Large area of cytotoxic edema throughout the left MCA territory. No acute hemorrhage. 3 mm rightward midline shift. Vascular: Atherosclerotic calcification of the internal carotid arteries at the skull base. Skull: Normal. Negative for fracture or focal lesion. Sinuses/Orbits: No acute finding. Other: None. IMPRESSION: 1. Slightly increased cytotoxic edema within the left MCA territory. 2. No acute hemorrhage. 3. 3 mm rightward midline shift. Electronically Signed   By: Deatra Robinson M.D.   On: 02/23/2020 22:00   US Carotid Bilateral  Result Date: 02/23/2020 CLINICAL DATA:  Slurred speech.  Carotid  stenosis. EXAM: BILATERAL CAROTID DUPLEX ULTRASOUND TECHNIQUE: Wallace Cullens scale imaging, color Doppler and duplex ultrasound were performed of bilateral carotid and vertebral arteries in the neck. COMPARISON:  CTA neck 02/23/2020 FINDINGS: Criteria: Quantification of carotid stenosis is based on velocity parameters that correlate the residual internal carotid diameter with NASCET-based stenosis levels, using the diameter of the distal internal carotid lumen as the denominator for stenosis measurement. The following velocity measurements were obtained: RIGHT ICA: 117/63 cm/sec CCA: 130/38 cm/sec SYSTOLIC ICA/CCA RATIO:  0.9 ECA: 220 cm/sec LEFT ICA: Occluded CCA: 75/10 cm/sec ECA: 195 cm/sec RIGHT CAROTID ARTERY: Mild atherosclerotic disease in the right common carotid artery. Heterogeneous plaque at the right carotid bulb. External carotid artery is patent with normal waveform. Atherosclerotic plaque in the proximal internal carotid artery. Normal waveforms and velocities in the internal carotid artery. RIGHT VERTEBRAL ARTERY: Antegrade flow and normal waveform in the right vertebral artery. LEFT CAROTID ARTERY: Left common carotid artery is patent with intimal thickening. Left carotid artery is patent. External carotid artery is patent with  normal waveform. 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 No flow in the mid or distal left internal carotid artery. LEFT VERTEBRAL ARTERY: Antegrade flow and normal waveform in the left vertebral artery. IMPRESSION: 1. 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 Findings correspond with recent CTA findings. 2. Atherosclerotic plaque involving the carotid arteries. Estimated degree of stenosis in the right internal carotid artery is less than 50%. 3. Patent vertebral arteries with antegrade flow. Electronically Signed   By: Richarda Overlie M.D.   On: 02/23/2020 14:42   PERIPHERAL VASCULAR CATHETERIZATION  Result Date: 02/21/2020 See op note  CT CEREBRAL PERFUSION W CONTRAST  Result Date: 02/23/2020 CLINICAL DATA:  Neuro deficit, acute, stroke suspected. EXAM: CT ANGIOGRAPHY HEAD AND NECK CT PERFUSION BRAIN TECHNIQUE: Multidetector CT imaging of the head and neck was performed using the standard protocol during bolus administration of intravenous contrast. Multiplanar CT image reconstructions and MIPs were obtained to evaluate the vascular anatomy. Carotid stenosis measurements (when applicable) are obtained utilizing NASCET criteria, using the distal internal carotid diameter as the denominator. Multiphase CT imaging of the brain was performed following IV bolus contrast injection. Subsequent parametric perfusion maps were calculated using RAPID software. CONTRAST:  OMNIPAQUE IOHEXOL 350 MG/ML SOLN COMPARISON:  Noncontrast head CT performed earlier the same day. FINDINGS: CTA NECK FINDINGS Aortic arch: The left vertebral artery arises directly from the aortic arch. Atherosclerotic plaque within the visualized aortic arch and proximal major branch vessels of the neck. No hemodynamically significant innominate or proximal subclavian artery stenosis. Right carotid system: The CCA and ICA are patent within the neck. Atherosclerotic plaque within these vessels. Most notably there is at  least moderate calcified plaque within the carotid bifurcation. Exact quantification of stenosis within the proximal right ICA is difficult due to motion artifact and blooming artifact of calcified plaque at this site. Additionally, there is 50-60% stenosis of the mid cervical ICA. Left carotid system: The CCA is patent to the bifurcation. 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 Vertebral arteries: The dominant right vertebral artery is patent throughout the neck. Moderate/severe atherosclerotic narrowing within the V1 right vertebral artery. The non dominant left vertebral artery is developmentally diminutive, but patent throughout the neck Skeleton: Reversal of the expected cervical lordosis. Cervical spondylosis with multilevel disc space narrowing, posterior disc osteophytes and uncovertebral hypertrophy. Other neck: No neck mass or cervical lymphadenopathy. Upper  chest: No consolidation within the imaged lung apices. 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 There is minimal reconstitution of flow within the supraclinoid left ICA. The M1 and M2 left middle cerebral arteries are patent, although with asymmetrically decreased opacification as compared to the right side. Additionally, there is a moderate to moderately severe focal stenosis within a superior division proximal M2 left MCA branch (series 8, image 17). The intracranial right internal carotid artery is patent. However, there are sites of severe stenosis within the cavernous and paraclinoid segments. The M1 right middle cerebral artery is patent without significant stenosis. No right M2 proximal branch occlusion is identified. There is a moderate to moderately severe stenosis within an inferior  division mid M2 right MCA branch vessel (series 9, image 19). The A1 left anterior cerebral artery is hypoplastic. The A1 right anterior cerebral artery is dominant. The A2 and more distal anterior cerebral arteries are patent. However, there are multifocal high-grade stenoses within the bilateral A2 anterior cerebral arteries. Posterior circulation: The non dominant intracranial left vertebral artery is developmentally diminutive, but patent and terminates as the left PICA. The dominant intracranial right vertebral artery is patent, although with sites of moderate stenosis. The basilar artery is patent. The posterior cerebral arteries are patent. However, there is a high-grade focal stenosis within the P2 left PCA and high-grade focal stenosis within the right PCA at the P2/P3 junction. Venous sinuses: Within limitations of contrast timing, no convincing thrombus. Anatomic variants: Posterior communicating arteries are hypoplastic or absent bilaterally. Review of the MIP images confirms the above findings CT Brain Perfusion Findings: ASPECTS: 4 CBF (<30%) Volume: 89mL (left MCA vascular territory) Perfusion (Tmax>6.0s) volume: (left MCA vascular territory) Mismatch Volume: 60mL Infarction Location:Left MCA vascular territory. These results were called by telephone at the time of interpretation on 02/23/2020 at 12:40 pm to provider [facility] , who verbally acknowledged these results. IMPRESSION: CTA neck: 1. 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 2. There is at least moderate calcified plaque at the right carotid bifurcation. There is stenosis at the origin of the cervical right vertebral artery which is poorly quantified due to motion degradation and blooming artifact from calcified plaque at this site. Carotid artery duplex is recommended for further evaluation. Additionally, there is apparent 50-60% stenosis of the mid cervical  right ICA. 3. Moderate/severe atherosclerotic narrowing within the Dom V1 right vertebral artery. 4. The left vertebral artery is developmentally diminutive, but patent throughout the neck. CTA head: 1. The left internal carotid artery remains occluded intracranially throughout the siphon region. There is minimal reconstitution of flow within the supraclinoid left ICA. The M1 and M2 left middle cerebral arteries are patent, although with asymmetrically decreased opacification as compared to the right side. Additionally, there is a moderate to moderately severe focal stenosis within a superior division proximal M2 left MCA branch. 2. Additional intracranial atherosclerotic disease with multifocal stenoses, most notably as follows. 3. High-grade stenoses within the cavernous/paraclinoid right ICA. 4. Moderate to moderately severe focal stenosis within an inferior division mid M2 right MCA branch vessel. 5. Moderate stenosis within the V4 right vertebral artery. 6. High-grade stenosis within the P2 left PCA. 7. High-grade stenosis within the right PCA at the P2/P3 junction. 8. Multifocal high-grade  stenoses within the A2 anterior cerebral arteries bilaterally. CT perfusion head: The perfusion software identifies an 89 mL core infarct within the left MCA vascular territory. The perfusion software identifies a 149 mL region of critically hypoperfused parenchyma within the left MCA vascular territory. Reported mismatch volume 60 mL. Electronically Signed   By: Jackey Loge DO   On: 02/23/2020 12:53   DG CHEST PORT 1 VIEW  Result Date: 02/24/2020 CLINICAL DATA:  59 year old male with shortness of breath. EXAM: PORTABLE CHEST 1 VIEW COMPARISON:  Chest radiograph dated 02/22/2020. FINDINGS: Status post removal of the endotracheal and enteric tube. There is diffuse interstitial densities which may be chronic or represent edema or atypical pneumonia. No large pleural effusion. No pneumothorax. Stable cardiac silhouette. No  acute osseous pathology. IMPRESSION: Interval removal of the endotracheal and enteric tubes. Electronically Signed   By: Elgie Collard M.D.   On: 02/24/2020 19:11   Portable Chest x-ray  Result Date: 02/22/2020 CLINICAL DATA:  Left cerebral artery and polyp stroke, postop bilateral iliofemoral thrombectomy, ETT EXAM: PORTABLE CHEST 1 VIEW COMPARISON:  01/12/2013 FINDINGS: Lungs are essentially clear.  No pleural effusion or pneumothorax. The heart is normal in size. Endotracheal tube terminates 5 cm above the carina. Enteric tube terminates in the proximal gastric body. IMPRESSION: Endotracheal tube terminates 5 cm above the carina. Enteric tube terminates in the proximal gastric body. Electronically Signed   By: Charline Bills M.D.   On: 02/22/2020 08:54   DG Swallowing Func-Speech Pathology  Result Date: 02/24/2020 Objective Swallowing Evaluation: Type of Study: MBS-Modified Barium Swallow Study  Patient Details Name: HOBIE KOHLES MRN: [id number] Date of Birth: 06/29/1961 Today's Date: 02/24/2020 Time: SLP Start Time (ACUTE ONLY): 1310 -SLP Stop Time (ACUTE ONLY): 1340 SLP Time Calculation (min) (ACUTE ONLY): 30 min Past Medical History: Past Medical History: Diagnosis Date . Aphasia S/P CVA  . DDD (degenerative disc disease), cervical  . ED (erectile dysfunction)  . Hyperlipidemia  . Hypertension  . Peripheral arterial disease (HCC)  . Stroke [facility])  Past Surgical History: Past Surgical History: Procedure Laterality Date . FEMORAL-FEMORAL BYPASS GRAFT Bilateral 02/22/2020  Procedure: bilateral ileofemoral thrombectomy, left popliteal exposure and thrombectomy, four compartment fasciotomy, left femoral endarterectomy with patch angioplasty;  Surgeon: Nada Libman, MD;  Location: [facility] ORS;  Service: Vascular;  Laterality: Bilateral; . LOWER EXTREMITY ANGIOGRAPHY Left 02/21/2020  Procedure: LOWER EXTREMITY ANGIOGRAPHY;  Surgeon: Renford Dills, MD;  Location: [facility] INVASIVE CV LAB;  Service:  Cardiovascular;  Laterality: Left; . PERIPHERAL VASCULAR CATHETERIZATION N/A 07/21/2015  Procedure: Abdominal Aortogram w/Lower Extremity;  Surgeon: Renford Dills, MD;  Location: [facility] INVASIVE CV LAB;  Service: Cardiovascular;  Laterality: N/A; . PERIPHERAL VASCULAR CATHETERIZATION  07/21/2015  Procedure: Lower Extremity Intervention;  Surgeon: Renford Dills, MD;  Location: [facility] INVASIVE CV LAB;  Service: Cardiovascular;; HPI: 59 y.o. male with a history of hypertension, hyperlipidemia, CVA, severe PAD  who presented with acute critical limb ischemia secondary to occlusion of the iliac artery who underwent stenting on 8/6 at [facility].  He was taken back to the OR on 8/7 found to have acute thrombosis of bilateral iliac stents underwent thrombectomy and endarterectomy;  fasciotomies of the left leg. Extubated 8/8. Developed right hemiplegia and aphasia morning of 8/8. CTH: large territory infarct left MCA. Pt transferred to [facility] same day.   Subjective: alert, aphasic Assessment / Plan / Recommendation CHL IP CLINICAL IMPRESSIONS 02/24/2020 Clinical Impression Pt presents with a primary oral dysphagia c/b impaired bolus cohesion and  control over release into the pharynx.  There was decreased sensation on the right, with barium residue in right lateral and anterior sulci and limited awareness of its presence.  Pharyngeal function was largely intact, with adequate pharyngeal squeeze, no residue post-swallow, and effective laryngeal vestibule closure.  Occasionally, thin and nectar thick barium reached the pyriforms for a delayed period of time before the swallow was triggered.  On other occasions, the swallow trigger was timely.  There was one incident of penetration to the vocal folds when a bolus of nectar thick liquid prematurely escaped the oral cavity and entered the laryngeal vestibule without eliciting a response.  Pt's impaired sensation appears to be the primary concern and puts him at greater risk of aspiration.   For now, recommend starting a dysphagia 1 diet, thin liquids from cup only - NO STRAWS; give meds whole in applesauce/pudding.  Due to pt's impulsivity, difficulty following commands, and poor sensation/awareness on right side of mouth/throat, he will require careful supervision during meals.  SLP will follow for safety/diet progression and therapeutic training.   SLP Visit Diagnosis Dysphagia, oropharyngeal phase (R13.12) Attention and concentration deficit following -- Frontal lobe and executive function deficit following -- Impact on safety and function Mild aspiration risk   CHL IP TREATMENT RECOMMENDATION 02/24/2020 Treatment Recommendations Therapy as outlined in treatment plan below   Prognosis 02/24/2020 Prognosis for Safe Diet Advancement Good Barriers to Reach Goals -- Barriers/Prognosis Comment -- CHL IP DIET RECOMMENDATION 02/24/2020 SLP Diet Recommendations Dysphagia 1 (Puree) solids;Thin liquid Liquid Administration via Cup;No straw Medication Administration Whole meds with puree Compensations Minimize environmental distractions;Slow rate;Small sips/bites Postural Changes --   CHL IP OTHER RECOMMENDATIONS 02/24/2020 Recommended Consults -- Oral Care Recommendations Oral care BID Other Recommendations --   CHL IP FOLLOW UP RECOMMENDATIONS 02/24/2020 Follow up Recommendations Inpatient Rehab   CHL IP FREQUENCY AND DURATION 02/24/2020 Speech Therapy Frequency (ACUTE ONLY) min 3x week Treatment Duration 2 weeks      CHL IP ORAL PHASE 02/24/2020 Oral Phase Impaired Oral - Pudding Teaspoon -- Oral - Pudding Cup -- Oral - Honey Teaspoon -- Oral - Honey Cup -- Oral - Nectar Teaspoon -- Oral - Nectar Cup -- Oral - Nectar Straw Right anterior bolus loss;Weak lingual manipulation;Reduced posterior propulsion;Holding of bolus;Right pocketing in lateral sulci;Pocketing in anterior sulcus;Lingual/palatal residue;Piecemeal swallowing;Decreased bolus cohesion Oral - Thin Teaspoon -- Oral - Thin Cup Right anterior bolus loss;Weak  lingual manipulation;Reduced posterior propulsion;Holding of bolus;Right pocketing in lateral sulci;Pocketing in anterior sulcus;Lingual/palatal residue;Piecemeal swallowing;Decreased bolus cohesion Oral - Thin Straw Right anterior bolus loss;Weak lingual manipulation;Reduced posterior propulsion;Holding of bolus;Right pocketing in lateral sulci;Pocketing in anterior sulcus;Lingual/palatal residue;Piecemeal swallowing;Decreased bolus cohesion Oral - Puree Right anterior bolus loss;Weak lingual manipulation;Reduced posterior propulsion;Holding of bolus;Right pocketing in lateral sulci;Pocketing in anterior sulcus;Lingual/palatal residue;Piecemeal swallowing;Decreased bolus cohesion Oral - Mech Soft -- Oral - Regular Right anterior bolus loss;Weak lingual manipulation;Reduced posterior propulsion;Holding of bolus;Right pocketing in lateral sulci;Pocketing in anterior sulcus;Lingual/palatal residue;Piecemeal swallowing;Decreased bolus cohesion Oral - Multi-Consistency -- Oral - Pill -- Oral Phase - Comment --  CHL IP PHARYNGEAL PHASE 02/24/2020 Pharyngeal Phase Impaired Pharyngeal- Pudding Teaspoon -- Pharyngeal -- Pharyngeal- Pudding Cup -- Pharyngeal -- Pharyngeal- Honey Teaspoon -- Pharyngeal -- Pharyngeal- Honey Cup -- Pharyngeal -- Pharyngeal- Nectar Teaspoon -- Pharyngeal -- Pharyngeal- Nectar Cup -- Pharyngeal -- Pharyngeal- Nectar Straw Delayed swallow initiation-pyriform sinuses;Delayed swallow initiation-vallecula;Penetration/Aspiration before swallow Pharyngeal Material enters airway, CONTACTS cords and not ejected out Pharyngeal- Thin Teaspoon -- Pharyngeal -- Pharyngeal- Thin Cup Delayed swallow  initiation-pyriform sinuses;Delayed swallow initiation-vallecula Pharyngeal -- Pharyngeal- Thin Straw Delayed swallow initiation-vallecula;Delayed swallow initiation-pyriform sinuses Pharyngeal -- Pharyngeal- Puree Delayed swallow initiation-vallecula;Delayed swallow initiation-pyriform sinuses Pharyngeal --  Pharyngeal- Mechanical Soft -- Pharyngeal -- Pharyngeal- Regular Delayed swallow initiation-vallecula Pharyngeal -- Pharyngeal- Multi-consistency -- Pharyngeal -- Pharyngeal- Pill -- Pharyngeal -- Pharyngeal Comment --  CHL IP CERVICAL ESOPHAGEAL PHASE 02/24/2020 Cervical Esophageal Phase WFL Pudding Teaspoon -- Pudding Cup -- Honey Teaspoon -- Honey Cup -- Nectar Teaspoon -- Nectar Cup -- Nectar Straw -- Thin Teaspoon -- Thin Cup -- Thin Straw -- Puree -- Mechanical Soft -- Regular -- Multi-consistency -- Pill -- Cervical Esophageal Comment -- Blenda Mounts Laurice 02/24/2020, 2:58 PM              ECHOCARDIOGRAM COMPLETE  Result Date: 02/24/2020    ECHOCARDIOGRAM REPORT   Patient Name:   DRADEN COTTINGHAM Date of Exam: 02/24/2020 Medical Rec #:  [id number]          Height:       67.0 in Accession #:    [id number]         Weight:       175.5 lb Date of Birth:  07-22-1960          BSA:          1.913 m Patient Age:    59 years           BP:           159/96 mmHg Patient Gender: M                  HR:           106 bpm. Exam Location:  Inpatient Procedure: 2D Echo and Intracardiac Opacification Agent Indications:    Stroke 434.91 / I163.9  History:        Patient has no prior history of Echocardiogram examinations.                 Peripheral arterial disease, underwent stenting, complicated by                 thrombosis requiring thrombectomy and LLE fasciotomy. Developed                 left facial droop. 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  Sonographer:    Leta Jungling RDCS Referring Phys: [id number] Timiya Howells IMPRESSIONS  1. Left ventricular ejection fraction, by estimation, is 60 to 65%. Left ventricular ejection fraction by 2D MOD biplane is 63.2 %. The left ventricle has normal function. The left ventricle has no regional wall motion abnormalities. Left ventricular diastolic parameters are indeterminate.  2. Right ventricular systolic function is normal. The right ventricular size is normal. There is normal pulmonary  artery systolic pressure. The estimated right ventricular systolic pressure is 30.9 mmHg.  3. The mitral valve is grossly normal. No evidence of mitral valve regurgitation. No evidence of mitral stenosis.  4. The aortic valve is tricuspid. Aortic valve regurgitation is not visualized. No aortic stenosis is present.  5. The inferior vena cava is normal in size with greater than 50% respiratory variability, suggesting right atrial pressure of 3 mmHg. Conclusion(s)/Recommendation(s): No intracardiac source of embolism detected on this transthoracic study. A transesophageal echocardiogram is recommended to exclude cardiac source of embolism if clinically indicated. FINDINGS  Left Ventricle: Left ventricular ejection fraction, by estimation, is 60 to 65%. Left ventricular ejection fraction by 2D MOD biplane is 63.2 %. The left ventricle has normal function. The left  ventricle has no regional wall motion abnormalities. Definity contrast agent was given IV to delineate the left ventricular endocardial borders. The left ventricular internal cavity size was normal in size. There is no left ventricular hypertrophy. Left ventricular diastolic parameters are indeterminate. Right Ventricle: The right ventricular size is normal. No increase in right ventricular wall thickness. Right ventricular systolic function is normal. There is normal pulmonary artery systolic pressure. The tricuspid regurgitant velocity is 2.64 m/s, and  with an assumed right atrial pressure of 3 mmHg, the estimated right ventricular systolic pressure is 30.9 mmHg. Left Atrium: Left atrial size was normal in size. Right Atrium: Right atrial size was normal in size. Pericardium: Trivial pericardial effusion is present. Presence of pericardial fat pad. Mitral Valve: The mitral valve is grossly normal. No evidence of mitral valve regurgitation. No evidence of mitral valve stenosis. Tricuspid Valve: The tricuspid valve is grossly normal. Tricuspid valve  regurgitation is trivial. No evidence of tricuspid stenosis. Aortic Valve: The aortic valve is tricuspid. Aortic valve regurgitation is not visualized. No aortic stenosis is present. Pulmonic Valve: The pulmonic valve was grossly normal. Pulmonic valve regurgitation is not visualized. No evidence of pulmonic stenosis. Aorta: The aortic root is normal in size and structure. Venous: The inferior vena cava is normal in size with greater than 50% respiratory variability, suggesting right atrial pressure of 3 mmHg. IAS/Shunts: The atrial septum is grossly normal.  LEFT VENTRICLE PLAX 2D                        Biplane EF (MOD) LVIDd:         4.90 cm         LV Biplane EF:   Left LVIDs:         3.60 cm                          ventricular LV PW:         0.90 cm                          ejection LV IVS:        1.10 cm                          fraction by LVOT diam:     1.90 cm                          2D MOD LV SV:         43                               biplane is LV SV Index:   22                               63.2 %. LVOT Area:     2.84 cm                                Diastology                                LV e' lateral:  8.93 cm/s LV Volumes (MOD)               LV E/e' lateral: 11.4 LV vol d, MOD    98.1 ml       LV e' medial:    6.08 cm/s A2C:                           LV E/e' medial:  16.8 LV vol d, MOD    88.1 ml A4C: LV vol s, MOD    53.3 ml A2C: LV vol s, MOD    23.4 ml A4C: LV SV MOD A2C:   44.8 ml LV SV MOD A4C:   88.1 ml LV SV MOD BP:    60.6 ml RIGHT VENTRICLE RV S prime:     11.40 cm/s TAPSE (M-mode): 1.9 cm LEFT ATRIUM             Index       RIGHT ATRIUM           Index LA diam:        3.90 cm 2.04 cm/m  RA Area:     13.40 cm LA Vol (A2C):   50.2 ml 26.24 ml/m RA Volume:   34.30 ml  17.93 ml/m LA Vol (A4C):   63.9 ml 33.40 ml/m LA Biplane Vol: 58.4 ml 30.53 ml/m  AORTIC VALVE LVOT Vmax:   88.30 cm/s LVOT Vmean:  53.100 cm/s LVOT VTI:    0.151 m  AORTA Ao Root diam: 3.30 cm MITRAL VALVE                 TRICUSPID VALVE MV Area (PHT): 7.16 cm     TR Peak grad:   27.9 mmHg MV Decel Time: 106 msec     TR Vmax:        264.00 cm/s MV E velocity: 102.00 cm/s MV A velocity: 49.20 cm/s   SHUNTS MV E/A ratio:  2.07         Systemic VTI:  0.15 m                             Systemic Diam: 1.90 cm Lennie Odor MD Electronically signed by Lennie Odor MD Signature Date/Time: 02/24/2020/1:09:12 PM    Final    CT HEAD CODE STROKE WO CONTRAST  Result Date: 02/23/2020 CLINICAL DATA:  Code stroke. Neuro deficit, acute, stroke suspected. Mental status change at 11 a.m., last known well 8 a.m. this morning. EXAM: CT HEAD WITHOUT CONTRAST TECHNIQUE: Contiguous axial images were obtained from the base of the skull through the vertex without intravenous contrast. COMPARISON:  Brain MRI 09/17/2018 FINDINGS: Brain: There is a large acute demarcated ischemic infarct within the left MCA vascular territory affecting the left frontal, temporal and parietal lobes as well as left insula and subinsular region. The left caudate and lentiform nuclei are also affected. No evidence of hemorrhagic conversion. There is mild associated mass effect with 3 mm rightward midline shift. Redemonstrated superimposed small chronic infarcts within the left cerebral hemisphere. Background mild generalized parenchymal atrophy and chronic small vessel ischemic disease. No extra-axial fluid collection. No evidence of intracranial mass. Vascular: No hyperdense vessel Skull: Atherosclerotic calcifications. Sinuses/Orbits: Visualized orbits show no acute finding. Mild ethmoid sinus mucosal thickening. No significant mastoid effusion. ASPECTS [facility] Stroke Program Early CT Score) - Ganglionic level infarction (caudate, lentiform nuclei, internal capsule, insula, M1-M3 cortex): 2 - Supraganglionic infarction (  M4-M6 cortex): 2 Total score (0-10 with 10 being normal): 4 These results were called by telephone at the time of interpretation on 02/23/2020 at 12:20 pm  to provider Erick Blinks, who verbally acknowledged these results. IMPRESSION: Large acute demarcated left MCA vascular territory as described. ASPECTS is 4. No evidence of hemorrhagic conversion. Mild mass effect with 3 mm rightward midline shift. Stable background mild generalized parenchymal atrophy and chronic small vessel ischemic disease. Superimposed small chronic infarcts within the left cerebral hemisphere. Electronically Signed   By: Jackey Loge DO   On: 02/23/2020 12:22   CT ANGIO HEAD CODE STROKE  Result Date: 02/23/2020 CLINICAL DATA:  Neuro deficit, acute, stroke suspected. EXAM: CT ANGIOGRAPHY HEAD AND NECK CT PERFUSION BRAIN TECHNIQUE: Multidetector CT imaging of the head and neck was performed using the standard protocol during bolus administration of intravenous contrast. Multiplanar CT image reconstructions and MIPs were obtained to evaluate the vascular anatomy. Carotid stenosis measurements (when applicable) are obtained utilizing NASCET criteria, using the distal internal carotid diameter as the denominator. Multiphase CT imaging of the brain was performed following IV bolus contrast injection. Subsequent parametric perfusion maps were calculated using RAPID software. CONTRAST:  OMNIPAQUE IOHEXOL 350 MG/ML SOLN COMPARISON:  Noncontrast head CT performed earlier the same day. FINDINGS: CTA NECK FINDINGS Aortic arch: The left vertebral artery arises directly from the aortic arch. Atherosclerotic plaque within the visualized aortic arch and proximal major branch vessels of the neck. No hemodynamically significant innominate or proximal subclavian artery stenosis. Right carotid system: The CCA and ICA are patent within the neck. Atherosclerotic plaque within these vessels. Most notably there is at least moderate calcified plaque within the carotid bifurcation. Exact quantification of stenosis within the proximal right ICA is difficult due to motion artifact and blooming artifact of  calcified plaque at this site. Additionally, there is 50-60% stenosis of the mid cervical ICA. Left carotid system: The CCA is patent to the bifurcation. 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 Vertebral arteries: The dominant right vertebral artery is patent throughout the neck. Moderate/severe atherosclerotic narrowing within the V1 right vertebral artery. The non dominant left vertebral artery is developmentally diminutive, but patent throughout the neck Skeleton: Reversal of the expected cervical lordosis. Cervical spondylosis with multilevel disc space narrowing, posterior disc osteophytes and uncovertebral hypertrophy. Other neck: No neck mass or cervical lymphadenopathy. Upper chest: No consolidation within the imaged lung apices. 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 There is minimal reconstitution of flow within the supraclinoid left ICA. The M1 and M2 left middle cerebral arteries are patent, although with asymmetrically decreased opacification as compared to the right side. Additionally, there is a moderate to moderately severe focal stenosis within a superior division proximal M2 left MCA branch (series 8, image 17). The intracranial right internal carotid artery is patent. However, there are sites of severe stenosis within the cavernous and paraclinoid segments. The M1 right middle cerebral artery is patent without significant stenosis. No right M2 proximal branch occlusion is identified. There is a moderate to moderately severe stenosis within an inferior division mid M2 right MCA branch vessel (series 9, image 19). The A1 left anterior cerebral artery is hypoplastic. The A1 right anterior cerebral artery is dominant. The A2 and more distal  anterior cerebral arteries are patent. However, there are multifocal high-grade stenoses within the bilateral A2 anterior cerebral arteries. Posterior circulation: The non dominant intracranial left vertebral artery is developmentally diminutive, but patent and terminates as the left PICA. The dominant intracranial right vertebral artery is patent, although with sites of moderate stenosis. The basilar artery is patent. The posterior cerebral arteries are patent. However, there is a high-grade focal stenosis within the P2 left PCA and high-grade focal stenosis within the right PCA at the P2/P3 junction. Venous sinuses: Within limitations of contrast timing, no convincing thrombus. Anatomic variants: Posterior communicating arteries are hypoplastic or absent bilaterally. Review of the MIP images confirms the above findings CT Brain Perfusion Findings: ASPECTS: 4 CBF (<30%) Volume: 89mL (left MCA vascular territory) Perfusion (Tmax>6.0s) volume: 149mL (left MCA vascular territory) Mismatch Volume: 60mL Infarction Location:Left MCA vascular territory. These results were called by telephone at the time of interpretation on 02/23/2020 at 12:40 pm to [facility] , who verbally acknowledged these results. IMPRESSION: CTA neck: 1. 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 2. There is at least moderate calcified plaque at the right carotid bifurcation. There is stenosis at the origin of the cervical right vertebral artery which is poorly quantified due to motion degradation and blooming artifact from calcified plaque at this site. Carotid artery duplex is recommended for further evaluation. Additionally, there is apparent 50-60% stenosis of the mid cervical right ICA. 3. Moderate/severe atherosclerotic narrowing within the Dom V1 right vertebral artery. 4. The left vertebral artery is developmentally diminutive, but patent throughout the neck. CTA  head: 1. The left internal carotid artery remains occluded intracranially throughout the siphon region. There is minimal reconstitution of flow within the supraclinoid left ICA. The M1 and M2 left middle cerebral arteries are patent, although with asymmetrically decreased opacification as compared to the right side. Additionally, there is a moderate to moderately severe focal stenosis within a superior division proximal M2 left MCA branch. 2. Additional intracranial atherosclerotic disease with multifocal stenoses, most notably as follows. 3. High-grade stenoses within the cavernous/paraclinoid right ICA. 4. Moderate to moderately severe focal stenosis within an inferior division mid M2 right MCA branch vessel. 5. Moderate stenosis within the V4 right vertebral artery. 6. High-grade stenosis within the P2 left PCA. 7. High-grade stenosis within the right PCA at the P2/P3 junction. 8. Multifocal high-grade stenoses within the A2 anterior cerebral arteries bilaterally. CT perfusion head: The perfusion software identifies an 89 mL core infarct within the left MCA vascular territory. The perfusion software identifies a 149 mL region of critically hypoperfused parenchyma within the left MCA vascular territory. Reported mismatch volume 60 mL. Electronically Signed   By: Jackey LogeKyle  Golden DO   On: 02/23/2020 12:53   CT ANGIO NECK CODE STROKE  Result Date: 02/23/2020 CLINICAL DATA:  Neuro deficit, acute, stroke suspected. EXAM: CT ANGIOGRAPHY HEAD AND NECK CT PERFUSION BRAIN TECHNIQUE: Multidetector CT imaging of the head and neck was performed using the standard protocol during bolus administration of intravenous contrast. Multiplanar CT image reconstructions and MIPs were obtained to evaluate the vascular anatomy. Carotid stenosis measurements (when applicable) are obtained utilizing NASCET criteria, using the distal internal carotid diameter as the denominator. Multiphase CT imaging of the brain was performed following IV  bolus contrast injection. Subsequent parametric perfusion maps were calculated using RAPID software. CONTRAST:  100mL OMNIPAQUE IOHEXOL 350 MG/ML SOLN COMPARISON:  Noncontrast head CT performed earlier the same day.  FINDINGS: CTA NECK FINDINGS Aortic arch: The left vertebral artery arises directly from the aortic arch. Atherosclerotic plaque within the visualized aortic arch and proximal major branch vessels of the neck. No hemodynamically significant innominate or proximal subclavian artery stenosis. Right carotid system: The CCA and ICA are patent within the neck. Atherosclerotic plaque within these vessels. Most notably there is at least moderate calcified plaque within the carotid bifurcation. Exact quantification of stenosis within the proximal right ICA is difficult due to motion artifact and blooming artifact of calcified plaque at this site. Additionally, there is 50-60% stenosis of the mid cervical ICA. Left carotid system: The CCA is patent to the bifurcation. 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 Vertebral arteries: The dominant right vertebral artery is patent throughout the neck. Moderate/severe atherosclerotic narrowing within the V1 right vertebral artery. The non dominant left vertebral artery is developmentally diminutive, but patent throughout the neck Skeleton: Reversal of the expected cervical lordosis. Cervical spondylosis with multilevel disc space narrowing, posterior disc osteophytes and uncovertebral hypertrophy. Other neck: No neck mass or cervical lymphadenopathy. Upper chest: No consolidation within the imaged lung apices. 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 There is minimal reconstitution of flow within the supraclinoid  left ICA. The M1 and M2 left middle cerebral arteries are patent, although with asymmetrically decreased opacification as compared to the right side. Additionally, there is a moderate to moderately severe focal stenosis within a superior division proximal M2 left MCA branch (series 8, image 17). The intracranial right internal carotid artery is patent. However, there are sites of severe stenosis within the cavernous and paraclinoid segments. The M1 right middle cerebral artery is patent without significant stenosis. No right M2 proximal branch occlusion is identified. There is a moderate to moderately severe stenosis within an inferior division mid M2 right MCA branch vessel (series 9, image 19). The A1 left anterior cerebral artery is hypoplastic. The A1 right anterior cerebral artery is dominant. The A2 and more distal anterior cerebral arteries are patent. However, there are multifocal high-grade stenoses within the bilateral A2 anterior cerebral arteries. Posterior circulation: The non dominant intracranial left vertebral artery is developmentally diminutive, but patent and terminates as the left PICA. The dominant intracranial right vertebral artery is patent, although with sites of moderate stenosis. The basilar artery is patent. The posterior cerebral arteries are patent. However, there is a high-grade focal stenosis within the P2 left PCA and high-grade focal stenosis within the right PCA at the P2/P3 junction. Venous sinuses: Within limitations of contrast timing, no convincing thrombus. Anatomic variants: Posterior communicating arteries are hypoplastic or absent bilaterally. Review of the MIP images confirms the above findings CT Brain Perfusion Findings: ASPECTS: 4 CBF (<30%) Volume: 89mL (left MCA vascular territory) Perfusion (Tmax>6.0s) volume: (left MCA vascular territory) Mismatch Volume: 60mL Infarction Location:Left MCA vascular territory. These results were called by telephone at the time  of interpretation on 02/23/2020 at 12:40 pm to provider [facility] , who verbally acknowledged these results. IMPRESSION: CTA neck: 1. 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 2. There is at least moderate calcified plaque at the right carotid bifurcation. There is stenosis at the origin of the cervical right vertebral artery which  is poorly quantified due to motion degradation and blooming artifact from calcified plaque at this site. Carotid artery duplex is recommended for further evaluation. Additionally, there is apparent 50-60% stenosis of the mid cervical right ICA. 3. Moderate/severe atherosclerotic narrowing within the Dom V1 right vertebral artery. 4. The left vertebral artery is developmentally diminutive, but patent throughout the neck. CTA head: 1. The left internal carotid artery remains occluded intracranially throughout the siphon region. There is minimal reconstitution of flow within the supraclinoid left ICA. The M1 and M2 left middle cerebral arteries are patent, although with asymmetrically decreased opacification as compared to the right side. Additionally, there is a moderate to moderately severe focal stenosis within a superior division proximal M2 left MCA branch. 2. Additional intracranial atherosclerotic disease with multifocal stenoses, most notably as follows. 3. High-grade stenoses within the cavernous/paraclinoid right ICA. 4. Moderate to moderately severe focal stenosis within an inferior division mid M2 right MCA branch vessel. 5. Moderate stenosis within the V4 right vertebral artery. 6. High-grade stenosis within the P2 left PCA. 7. High-grade stenosis within the right PCA at the P2/P3 junction. 8. Multifocal high-grade stenoses within the A2 anterior cerebral arteries bilaterally. CT perfusion head: The perfusion software identifies an 89 mL core infarct within the left MCA vascular territory. The perfusion  software identifies a 149 mL region of critically hypoperfused parenchyma within the left MCA vascular territory. Reported mismatch volume 60 mL. Electronically Signed   By: Jackey Loge DO   On: 02/23/2020 12:53    PHYSICAL EXAM  Temp:  [97.7 F (36.5 C)-99.9 F (37.7 C)] 98.8 F (37.1 C) (08/11 0800) Pulse Rate:  [25-106] 82 (08/11 1130) Resp:  [18-33] 24 (08/11 1130) BP: (137-191)/(62-102) 183/75 (08/11 1100) SpO2:  [89 %-99 %] 98 % (08/11 1130)  General - Well nourished, well developed, in no apparent distress. Gurgly sound in the throat   Ophthalmologic - fundi not visualized due to noncooperation.  Cardiovascular - irregularly irregular heart rate and rhythm.  Neuro - awake alert, still global aphasia with word salad. Only able to close eyes on command, but then perseverated on it. Not able to follow peripheral commands. Eyes left gaze preference but able to cross midline, right gaze incomplete. Blinking to visual threat on the left but not on the right. Right facial droop. Tongue midline in mouth. LUE 5/5, RUE flaccid. LLE 3/5 and RLE 2/5 proximally. Not moving toes bilaterally. Sensation, coordination not cooperative and gait not tested.   ASSESSMENT/PLAN Mr. Jonathan [name] is a 59 y.o. male with history of hypertension, hyperlipidemia, smoker, stroke in 09/2019 admitted for bilateral acute limb ischemia status post stenting with reocclusion status post thrombectomy and endarterectomy as well as left leg fasciotomy.  Developed aphasia and right hemiplegia.  CT showed left large MCA infarct.  Transferred to [facility] for further evaluation. No tPA given due to on argatroban and established stroke.    Stroke:  left large MCA infarct due to left ICA occlusion most likely due to chronic left carotid artery stenosis/occlusion in the setting of operation (possible hypotension) vs. PAF  CT head left MCA large infarct  CTA head and neck left ICA occlusion, right ICA bulb moderate stenosis  with mid cervical right ICA 50 to 60% stenosis.  High-grade stenosis right ICA siphon.  Bilateral M2 moderate to severe focal stenosis.  Right V1 and V4 moderate to severe stenosis.  High-grade stenosis bilateral P2 and A2.  Carotid Doppler left ICA occluded at origin, right ICA less than  50% stenosis.  Repeat CT left MCA infarct increased size and surrounding edema  CT 8/11 stable large left MCA infarct with MLS 4-63mm  2D Echo EF 60 to 65%  LDL 71  HgbA1c 6.2  lovenox for VTE prophylaxis   Argatroban prior to admission, now on aspirin 325 mg daily and clopidogrel 75 mg daily. May consider AC 2 weeks post stroke given large size of stroke  Patient counseled to be compliant with his antithrombotic medications  Ongoing aggressive stroke risk factor management  Therapy recommendations: Pending  Disposition: Pending  Cerebral edema  CT showed large left MCA infarct with mild midline shift  On 3% saline  Sodium goal 150-155  Na 143->152->153->154  Sodium check every 6 hours  CT 8/11 showed slight worsening midline shift 4-35mm  PAF  PAF on EKGs  Trop 2259->2260->3089  No AC at this time due to large left MCA infarcts - Continue DAPT for now  May consider AC 2 weeks post stroke  HIT ruled out  Platelet [id number]  Was on argatroban, now off  HIT panel neg  On lovenox for DVT prophylaxis   Bilateral ischemic lower limb  S/p bilateral iliac artery stents  Bilateral stent reocclusion -> bilateral thrombectomy and endarterectomy + as well as left LE fasciotomy  VVS on board  On DAPT  History of stroke  09/2019 presented with slurred speech and incoherent speech.  MRI showed left MCA scattered infarcts.  No MRI or CTA performed.  However from MRI T2 imaging concerning for left ICA stenosis  Patient otherwise for admission at that time for further stroke work-up however patient refused and signed AMA.  Home medication including aspirin 325, Plavix 75  and Lipitor 80, not sure compliance  No neurology follow-up noted  Hypertension . Stable at high end . Will resume home lisinopril 20 . CCM also added metoprolol 50 tid . Gradually lower BP to goal  Long term BP goal 130-150 given left ICA occlusion  Hyperlipidemia  Home meds: Lipitor 80  LDL 71, goal < 70  Now on Lipitor 80  Continue statin at discharge  Transaminitis  AST/ALT 203/46 -> 163/56-> pending  Continue to monitor  Okay to continue Lipitor at this time  Acute blood loss anemia  Hemoglobin 12.0-9.3-7.2-7.4-9.4->11.5->10.4  Close monitoring  Transfuse PRBC if hemoglobin less than 7  Dysphagia Aspiration   Passed swallow  Now on dysphagia 1 diet and thin liquid  Speech on board  Aspiration precaution  Tobacco abuse  Current smoker  Smoking cessation counseling provided  Nicotine patch provided  Pt is willing to quit  Other Stroke Risk Factors  Multifocal severe vasculopathy  Other Active Problems  Hypokalemia K 3.3->3.9->3.2->2.7 - supplement  Fever T-max 100.6-> afebrile  Hospital day # 3  This patient is critically ill due to acute ischemic limb s/p stenting, thrombectomy and endarterectomy, afib RVR, large left MCA infarct with cerebral edema, dysphagia, aspiration and at significant risk of neurological worsening, death form brain herniation, recurrent stroke, hemorrhagic conversion, heart failure, respiratory failure, aspiration pneumonia, seizure. This patient's care requires constant monitoring of vital signs, hemodynamics, respiratory and cardiac monitoring, review of multiple databases, neurological assessment, discussion with family, other specialists and medical decision making of high complexity. I spent 35 minutes of neurocritical care time in the care of this patient. I discussed with CCM Dr. Merrily Pew.   Marvel Plan, MD PhD Stroke Neurology 02/26/2020 11:35 AM    To contact Stroke Continuity provider, please refer to  [email or web address]. After hours,  contact General Neurology

## 2020-02-26 NOTE — Progress Notes (Signed)
Assisted tele visit to patient with wife. Some technical difficulty with picture but not with sound Jonathan Austria, RN

## 2020-02-26 NOTE — Progress Notes (Addendum)
NAME:  Jonathan [name], MRN:  [id number], DOB:  [birth year], LOS: 3 ADMISSION DATE:02/23/2020, CONSULTATION DATE:02/23/2020 REFERRING MD:Dr. Myra Gianotti, CHIEF COMPLAINT:L MCA stroke, vasculopathy  Brief History   Jonathan [name] is a 59 year old male with past medical history of hypertension, hyperlipidemia, tobacco abuse, significant PAD, who was admitted to [facility] for acute ischemia of the left lower extremity status post percutaneous transluminal angioplasty and stent placement bilateral common iliac arteries and external iliac arteries on 02/21/2020.  On 02/22/2020, he was found to have acute thrombosis of bilateral iliac stents which he underwentbilateral thrombectomy, left popliteal exposure and thrombectomy, left femoral endarterectomy and patch angioplasty as well as left fasciotomy.Argatroban was started for concerning of heparin-induced thrombocytopenia.  On 02/23/2020, patient developed slurred speech, right-sided weakness. CT head shows left MCA stroke. Patient was transferred to [facility] for further care  Past Medical History  Hypertension Tobacco abuse Hyperlipidemia PAD  Significant Hospital Events   8/6-8/7--critical limb ischemia requiring surgical intervention 8/8--stroke in the face of anticoagulation with heparin, subsequently argatroban when rethrombosed  Consults:  Neurology  Vascular surgery  Procedures:  02/21/2020 angioplasty and stents in iliac arteries 02/22/2020 thrombectomy and endarterectomy and fasciotomy  Significant Diagnostic Tests:  8/8 CT head:Large acute demarcated left MCA vascular territory as described.ASPECTS is 4. No evidence of hemorrhagic conversion. Mild mass effect with 3 mm rightward midline shift. 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 There is minimal reconstitution of flow within the supraclinoid left ICA. The M1 and M2 left middle cerebral arteries are patent, although with asymmetrically decreased opacification as compared to the right side. Additionally, there is a moderate to moderately severe focal stenosis within a superior division proximal M2 left MCA branch.The perfusion software identifies an 89 mL core infarct within the left MCA vascular territory. The perfusion software identifies a region of critically hypoperfused parenchyma within the left MCA vascular territory. Reported mismatch volume 60 mL  Micro Data:  8/6>> COVID neg 8/8>>MRSAnegative  Antimicrobials:  None  Interim history/subjective:  Patient is seen at bedside today.  He is pleasant and in no acute distress.  He still frustrated because of his expressive aphasia.  Patient ate all his breakfast without any choking or vomiting.  Speaking with his wife yesterday with permission from the patient.  All questions were answered.  His wife will be here today  Objective   Blood pressure (!) 165/87, pulse 87, temperature 99.2 F (37.3 C), temperature source Oral, resp. rate (!) 30, SpO2 97 %.        Intake/Output Summary (Last 24 hours) at 02/26/2020 0735 Last data filed at 02/26/2020 0700 Gross per 24 hour  Intake 1722.19 ml  Output 2400 ml  Net -677.81 ml   There were no vitals filed for this visit.  Physical Exam Constitutional:      General: He is not in acute distress.    Appearance: He is not toxic-appearing.     Comments: Still frustrated because of expressive aphasia  HENT:     Head: Normocephalic.  Eyes:     General: No scleral icterus.       Right eye: No discharge.        Left eye: No discharge.     Conjunctiva/sclera: Conjunctivae normal.  Cardiovascular:     Rate and Rhythm: Normal rate and regular rhythm.     Heart sounds:  No murmur heard.   Pulmonary:     Effort: No respiratory distress.     Comments:  Still very coarse lung sound likely due to mucous Abdominal:     General: There is no distension.     Palpations: Abdomen is soft.     Tenderness: There is no abdominal tenderness.  Musculoskeletal:     Cervical back: Normal range of motion.     Right lower leg: No edema.     Left lower leg: No edema.     Comments: Swollen scrotum  Skin:    General: Skin is warm.     Coloration: Skin is not jaundiced.  Neurological:     Mental Status: He is alert.  Psychiatric:        Mood and Affect: Mood normal.     Resolved Hospital Problem list     Assessment & Plan:  LeftMCA stroke Acute lower leg ischemiastatus post angioplasty and stent placement HIT ruled out  Aspiration pneumonitis A. fib with RVR Elevated Troponin Blood loss anemia  Plan:  Left ICA stroke Patient developed slurred speech and right-sided weakness on 8/8. CT head showed large acute left MCA infarct with no acute hemorrhage, 3 mm rightward midline shift. CTA shows occluded left ICA,moderately severe focal stenosis of proximal M2 left MCA branch, and high-grade stenosis of PCA.Neurology is on board.After speaking with Dr. Alfredia Client is likely an ischemic stroke secondary to hypotension following the procedure. Patient had history of left MCA infarct in March 2021 and the hypotension has exacerbated thecarotid stenosis.  Continue to wean down hypertonic saline per neurology. -Appreciate neurology recommendation -Frequent neurochecks -Continue aspirin 325 mg p.o. and Plavix 75 mg p.o. -Continue hypertonic saline IV 75 cc/h for cerebral edema with frequent sodium check per neurology   Acute lower leg ischemia status post angioplasty and stent placement Patient underwent thrombectomy and endarterectomy with fasciotomy. There is a concern for heparin-induced thrombocytopeniaeven though the platelet drops is not significant. HIT panel was negative so Lovenox was started for DVT prophylaxis.  Vascular surgery  is following -Appreciate vascular surgery recommendations -Lovenox for DVT prophylaxis   A. fib with RVR This seems to be a new diagnosis for him.  EKG shows A. fib with rate of 160, no clear evidence for ischemia.  His echo shows a normal EF of 60-65% with normal right and left ventricular function.  Patient does drink alcohol but unknown amount due to his inability to speak.  Unknown last drink. AST/ALT ratio > 2. This A. fib can be a result of alcohol withdrawal.  Place patient on CIWA protocol with Ativan.  TSH normal. -Switch to Po metoprolol 50 mg TID and D/c diltiazem drip as tolerated. -He is not a candidate for anticoagulation due to risk of hemorrhagic conversion and HIT.  -CIWA with Ativan --Continue folate and thiamine   Elevated troponin Troponin elevated at 2259 and 2260. Last Echo shows normal EF with no abnormal wall motion. EKG did not show any evidence of ischemia. This maybe secondary to his Afib.  Repeat EKG shows normal sinus rhythm with no obvious sign of ischemia.. Patient denies chest pain at the moment.  --Continue to monitor   Aspiration pneumonitis Tachypnea Patient passed barium swallow test but still aspirated yesterday afternoon.  We will resume food slowly and encourage patient to chew and swallow carefully.  Patient is tachypneic occasionally likely secondary to his frustration with his inability to speak.  He is satting well on 2 L of nasal cannula --Continue  to monitor for sign of aspiration or respiratory distress -Continue chest physiotherapy and nebs --No antibiotics indicated at this time   Blood loss anemia-stable -CBC daily -Transfuse if less than 7   Best practice:  Diet:puree Pain/Anxiety/Delirium protocol (if indicated):CIWA w Ativan VAP protocol (if indicated):n/a DVT prophylaxis:Lovenox GI prophylaxis:protonix Glucose control:SSI if needed Mobility:bed Code Status:full Family Communication:Update  wife Disposition:ICU for hypertonic saline  Critical care time: 20 min    Doran Stabler, DO Internal Medicine Residency My pager: [phone]

## 2020-02-27 DIAGNOSIS — I482 Chronic atrial fibrillation, unspecified: Secondary | ICD-10-CM

## 2020-02-27 DIAGNOSIS — G936 Cerebral edema: Secondary | ICD-10-CM

## 2020-02-27 DIAGNOSIS — E870 Hyperosmolality and hypernatremia: Secondary | ICD-10-CM

## 2020-02-27 DIAGNOSIS — Z8673 Personal history of transient ischemic attack (TIA), and cerebral infarction without residual deficits: Secondary | ICD-10-CM

## 2020-02-27 LAB — CBC
HCT: 32.1 % — ABNORMAL LOW (ref 39.0–52.0)
Hemoglobin: 10.1 g/dL — ABNORMAL LOW (ref 13.0–17.0)
MCH: 31.2 pg (ref 26.0–34.0)
MCHC: 31.5 g/dL (ref 30.0–36.0)
MCV: 99.1 fL (ref 80.0–100.0)
Platelets: 212 10*3/uL (ref 150–400)
RBC: 3.24 MIL/uL — ABNORMAL LOW (ref 4.22–5.81)
RDW: 14.6 % (ref 11.5–15.5)
WBC: 9.5 10*3/uL (ref 4.0–10.5)
nRBC: 0.4 % — ABNORMAL HIGH (ref 0.0–0.2)

## 2020-02-27 LAB — HEPATIC FUNCTION PANEL
ALT: 75 U/L — ABNORMAL HIGH (ref 0–44)
AST: 96 U/L — ABNORMAL HIGH (ref 15–41)
Albumin: 2.5 g/dL — ABNORMAL LOW (ref 3.5–5.0)
Alkaline Phosphatase: 37 U/L — ABNORMAL LOW (ref 38–126)
Bilirubin, Direct: 0.2 mg/dL (ref 0.0–0.2)
Indirect Bilirubin: 0.6 mg/dL (ref 0.3–0.9)
Total Bilirubin: 0.8 mg/dL (ref 0.3–1.2)
Total Protein: 5.6 g/dL — ABNORMAL LOW (ref 6.5–8.1)

## 2020-02-27 LAB — SODIUM
Sodium: 160 mmol/L — ABNORMAL HIGH (ref 135–145)
Sodium: 156 mmol/L — ABNORMAL HIGH (ref 135–145)
Sodium: 154 mmol/L — ABNORMAL HIGH (ref 135–145)

## 2020-02-27 LAB — BASIC METABOLIC PANEL
Anion gap: 9 (ref 5–15)
BUN: 19 mg/dL (ref 6–20)
CO2: 27 mmol/L (ref 22–32)
Calcium: 8.8 mg/dL — ABNORMAL LOW (ref 8.9–10.3)
Chloride: 124 mmol/L — ABNORMAL HIGH (ref 98–111)
Creatinine, Ser: 0.76 mg/dL (ref 0.61–1.24)
GFR calc Af Amer: >60 mL/min (ref >60)
GFR calc non Af Amer: >60 mL/min (ref >60)
Glucose, Bld: 150 mg/dL — ABNORMAL HIGH (ref 70–99)
Potassium: 3.4 mmol/L — ABNORMAL LOW (ref 3.5–5.1)
Sodium: 160 mmol/L — ABNORMAL HIGH (ref 135–145)

## 2020-02-27 LAB — TROPONIN I (HIGH SENSITIVITY): Troponin I (High Sensitivity): 2227 ng/L (ref <18)

## 2020-02-27 MED ORDER — SODIUM CHLORIDE 3 % IN NEBU
4.0000 mL | INHALATION_SOLUTION | RESPIRATORY_TRACT | Status: DC
  Filled 2020-02-27: qty 4

## 2020-02-27 MED ORDER — METOPROLOL TARTRATE 50 MG PO TABS
75.0000 mg | ORAL_TABLET | Freq: Two times a day (BID) | ORAL | Status: DC
  Administered 2020-02-27 – 2020-03-10 (×25): 75 mg via ORAL
  Filled 2020-02-27 (×25): qty 1

## 2020-02-27 MED ORDER — AMLODIPINE BESYLATE 10 MG PO TABS
10.0000 mg | ORAL_TABLET | Freq: Every day | ORAL | Status: DC
  Administered 2020-02-27 – 2020-03-10 (×13): 10 mg via ORAL
  Filled 2020-02-27 (×13): qty 1

## 2020-02-27 MED ORDER — SODIUM CHLORIDE 0.45 % IV SOLN: INTRAVENOUS | Status: DC

## 2020-02-27 MED ORDER — SODIUM CHLORIDE 3 % IN NEBU
4.0000 mL | INHALATION_SOLUTION | RESPIRATORY_TRACT | Status: DC
  Administered 2020-02-27 – 2020-02-29 (×10): 4 mL via RESPIRATORY_TRACT
  Filled 2020-02-27 (×13): qty 4

## 2020-02-27 MED ORDER — ACETAMINOPHEN 325 MG PO TABS
650.0000 mg | ORAL_TABLET | Freq: Four times a day (QID) | ORAL | Status: DC | PRN
  Administered 2020-03-08: 650 mg via ORAL
  Filled 2020-02-27: qty 2

## 2020-02-27 MED ORDER — POTASSIUM CHLORIDE CRYS ER 20 MEQ PO TBCR
40.0000 meq | EXTENDED_RELEASE_TABLET | ORAL | Status: AC
  Administered 2020-02-27 (×2): 40 meq via ORAL
  Filled 2020-02-27 (×2): qty 2

## 2020-02-27 NOTE — Progress Notes (Signed)
NAME:  Jonathan [name], MRN:  [id number], DOB:  12-Sep-1960, LOS: 4 ADMISSION DATE:02/23/2020, CONSULTATION DATE:02/23/2020 REFERRING MD:Dr. Myra Gianotti, CHIEF COMPLAINT:L MCA stroke, vasculopathy  Brief History   Jonathan [name] is a 59 year old male with past medical history of hypertension, hyperlipidemia, tobacco abuse, significant PAD, who was admitted to [facility] for acute ischemia of the left lower extremity status post percutaneous transluminal angioplasty and stent placement bilateral common iliac arteries and external iliac arteries on 02/21/2020.  On 02/22/2020, he was found to have acute thrombosis of bilateral iliac stents which he underwentbilateral thrombectomy, left popliteal exposure and thrombectomy, left femoral endarterectomy and patch angioplasty as well as left fasciotomy.Argatroban was started for concerning of heparin-induced thrombocytopenia.  On 02/23/2020, patient developed slurred speech, right-sided weakness. CT head shows left MCA stroke. Patient was transferred to [facility] for further care  Past Medical History  Hypertension Tobacco abuse Hyperlipidemia PAD  Significant Hospital Events   8/6-8/7--critical limb ischemia requiring surgical intervention 8/8--stroke in the face of anticoagulation with heparin, subsequently argatroban when rethrombosed  Consults:  Neurology  Vascular surgery  Procedures:  02/21/2020 angioplasty and stents in iliac arteries 02/22/2020 thrombectomy and endarterectomy and fasciotomy  Significant Diagnostic Tests:  8/8 CT head:Large acute demarcated left MCA vascular territory as described.ASPECTS is 4. No evidence of hemorrhagic conversion. Mild mass effect with 3 mm rightward midline shift. 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 There is minimal reconstitution of flow within the supraclinoid left ICA. The M1 and M2 left middle cerebral arteries are patent, although with asymmetrically decreased opacification as compared to the right side. Additionally, there is a moderate to moderately severe focal stenosis within a superior division proximal M2 left MCA branch.The perfusion software identifies an 89 mL core infarct within the left MCA vascular territory. The perfusion software identifies a region of critically hypoperfused parenchyma within the left MCA vascular territory. Reported mismatch volume 60 mL  Micro Data:  8/6>> COVID neg 8/8>>MRSAnegative  Antimicrobials:  None  Interim history/subjective:  Patient is seen at bedside today.  He is pleasant and in no respiratory acute distress.  Patient reports no complain and wonder how long does he have to stay in the ICU.  All questions was answered.  Continue hypertonic saline due to 5 mm midline shift on CT head yesterday   Objective   Blood pressure (!) 166/92, pulse 79, temperature 98.9 F (37.2 C), temperature source Axillary, resp. rate (!) 27, SpO2 96 %.        Intake/Output Summary (Last 24 hours) at 02/27/2020 0740 Last data filed at 02/27/2020 0600 Gross per 24 hour  Intake 1165.51 ml  Output 800 ml  Net 365.51 ml   There were no vitals filed for this visit.  Physical Exam Constitutional:      General: He is not in acute distress.    Appearance: He is not toxic-appearing.     Comments: Still frustrated because of expressive aphasia  HENT:     Head: Normocephalic.  Eyes:     General: No scleral icterus.       Right eye: No discharge.        Left eye: No discharge.     Conjunctiva/sclera: Conjunctivae normal.  Cardiovascular:     Rate and Rhythm: Normal rate and regular rhythm.     Heart sounds: No murmur heard.  Pulmonary:     Effort: No respiratory distress.     Comments: Lung sounds improved but still noticed some  wheezing Abdominal:     Palpations: Abdomen is soft.  Musculoskeletal:     Cervical back: Normal range of motion.     Right lower leg: No edema.     Left lower leg: No edema.     Comments: Swollen scrotum  Skin:    General: Skin is warm.     Coloration: Skin is not jaundiced.  Neurological:     Mental Status: He is alert.     Comments: Normal strength and movement in the left upper extremity and lower extremity. Unable to move right upper and lower extremities  Psychiatric:        Mood and Affect: Mood normal.     Resolved Hospital Problem list     Assessment & Plan:  LeftMCA stroke Acute lower leg ischemiastatus post angioplasty and stent placement HIT ruled out  Aspiration pneumonitis A. fib with RVR Elevated Troponin Blood loss anemia-stable  Plan:  Left MCA stroke CT head showed large acute left MCA infarct with no acute hemorrhage, 3 mm rightward midline shift. CTA shows occluded left ICA,moderately severe focal stenosis of proximal M2 left MCA branch, and high-grade stenosis of PCA.Neurology is on board.  Continue hypertonic saline due to a 5 mm left to right shift head CT which is slightly worse than before.   -Appreciate neurology recommendation -Frequent neurochecks -Continue aspirin 325 mg p.o. and Plavix 75 mg p.o. -Continue hypertonic saline IV 75 cc/h for cerebral edema with frequent sodium check per neurology -Continue amlodipine 10 mg, lisinopril 20 mg for hypertension    Acute lower leg ischemia status post angioplasty and stent placement Patient underwent thrombectomy and endarterectomy with fasciotomy. There is a concern for heparin-induced thrombocytopeniaeven though the platelet drops is not significant. HIT panel was negative so Lovenox was started for DVT prophylaxis.  Vascular surgery is following -Appreciate vascular surgery recommendations -Lovenox for DVT prophylaxis   A. fib with RVR This seems to be a new diagnosis for him. His  echo shows a normal EF of 60-65% with normal right and left ventricular function. Telemetry shows normal sinus rhythm.  Currently rate controlled. -Continue metoprolol 75 mg twice daily  -DC diltiazem drip -He is not a candidate for anticoagulation due to risk of hemorrhagic conversion.  -CIWA with Ativan --Continue folate and thiamine   Elevated troponin Troponin elevated at 2259 - 2260 - 2227. Last Echo shows normal EF with no abnormal wall motion. EKG did not show any evidence of ischemia. This maybe secondary to his Afib.  Repeat EKG shows normal sinus rhythm with no obvious sign of ischemia.  Likely demand ischemia secondary to A. fib --Continue to monitor troponin   Aspiration pneumonitis Tachypnea Continue pured food.  Encourage patient to chew and swallow slowly.  No more aspirating events in the last 24 hours. --Continue to monitor for sign of aspiration or respiratory distress -Continue breathing treatment as needed --No antibiotics indicated at this time   Blood loss anemia-stable -CBC daily -Transfuse if less than 7   Best practice:  Diet:puree Pain/Anxiety/Delirium protocol (if indicated):CIWA w Ativan VAP protocol (if indicated):n/a DVT prophylaxis:Lovenox GI prophylaxis:protonix Glucose control:SSI if needed Mobility:bed Code Status:full Family Communication:Today family Disposition:ICU for hypertonic saline  Critical care time: 20 min    Doran Stabler, DO Internal Medicine Residency My pager: [phone]

## 2020-02-27 NOTE — Progress Notes (Addendum)
Occupational Therapy Re-Evaluation Patient Details Name: Jonathan [name] MRN: [id number] DOB: 06-22-1961 Today's Date: 02/27/2020    History of Present Illness Pt is 59 y/o M with PMH: DDD, HTN, HLD, h/o CVA with some acute aphasia (wife reports his speech was normal before this admission), and PAD s/p stent in 2017. Presented with L LE pain. Now s/p angio with PCI and stent placement on 8/6 and revascularization of L LE on 8/7. Extubated 02/23/20. Of note, during OT assessment, significant R sided weakness and significant aphasia present. OT notified RN immediately who called code stroke.   Clinical Impression   Pt admitted with above. He demonstrates the below listed deficits and will benefit from continued OT to maximize safety and independence with BADLs.  Pt presents to OT with Rt hemiparesis, impaired balance, decreased activity tolerance, impaired balance, Rt inattention, visual deficits, and cognitive and communication deficits.  He currently requires mod - total A for ADLs.  PTA, he lived with his wife, and was fully independent with ADL and IADLs including working and driving.  Recommend CIR level rehab at discharge.    Pt exhibits significant scrotal edema and bruising which causes pain upon standing. He may benefit from a scrotal sling.     Follow Up Recommendations  CIR    Equipment Recommendations  None recommended by OT    Recommendations for Other Services Rehab consult     Precautions / Restrictions Precautions Precautions: Fall Restrictions Weight Bearing Restrictions: No      Mobility Bed Mobility Overal bed mobility: Needs Assistance Bed Mobility: Supine to Sit;Sit to Supine     Supine to sit: Mod assist;+2 for physical assistance;+2 for safety/equipment Sit to supine: Max assist;+2 for physical assistance;+2 for safety/equipment   General bed mobility comments: Pt required assist to initiate Rt LE movement, assist to lift trunk, and assist to scoot to EOB    Transfers Overall transfer level: Needs assistance Equipment used: 2 person hand held assist Transfers: Sit to/from Stand Sit to Stand: Max assist;+2 physical assistance;+2 safety/equipment         General transfer comment: Pt initially required mod A +2 for sit to stand, but attempted x 2 more times, and pt required max A +2 and demonstrated difficulty maintaining full hip and knee extension - required facilitation, and fatigued quickly returning to sitting abruptly     Balance Overall balance assessment: Needs assistance Sitting-balance support: Feet supported;Single extremity supported Sitting balance-Leahy Scale: Poor Sitting balance - Comments: requires UE support and min guard to mod A  Postural control: Posterior lean;Right lateral lean Standing balance support: Bilateral upper extremity supported Standing balance-Leahy Scale: Zero Standing balance comment: Pt requires max A +2 to maintain standing balance for 15-30 seconds                            ADL either performed or assessed with clinical judgement   ADL Overall ADL's : Needs assistance/impaired Eating/Feeding: Minimal assistance;Bed level Eating/Feeding Details (indicate cue type and reason): pt with frequent spillage and decreased follow through of precautions  Grooming: Wash/dry hands;Wash/dry face;Oral care;Sitting;Moderate assistance   Upper Body Bathing: Moderate assistance;Sitting   Lower Body Bathing: Maximal assistance;Sit to/from stand   Upper Body Dressing : Maximal assistance;Sitting   Lower Body Dressing: Total assistance;Sit to/from stand   Toilet Transfer: Total assistance Toilet Transfer Details (indicate cue type and reason): unable  Toileting- Clothing Manipulation and Hygiene: Total assistance;Sit to/from stand;+2 for physical assistance  Functional mobility during ADLs: Maximal assistance;+2 for physical assistance;+2 for safety/equipment       Vision Baseline  Vision/History:  (unable to accurately determine due to communication deficits) Patient Visual Report: Other (comment) (pt unable to provide info ) Vision Assessment?: Yes Eye Alignment: Within Functional Limits Ocular Range of Motion: Within Functional Limits Tracking/Visual Pursuits: Other (comment) Visual Fields: Other (comment) Additional Comments: Pt unable to follow complexity of commands to complete formal visual assessment.  He demonstrates full EOMs, but looses fixation during pursuits.  He is easily distractable.  when double stimuli presented in Lt and Rt visual field, he consistently missed the items on the Rt.      Perception Perception Perception Tested?: Yes Perception Deficits: Inattention/neglect Inattention/Neglect: Does not attend to right visual field;Does not attend to right side of body   Praxis Praxis Praxis tested?: Within functional limits    Pertinent Vitals/Pain Pain Assessment: Faces Faces Pain Scale: Hurts even more Pain Location: scrotum  Pain Descriptors / Indicators: Grimacing;Guarding;Moaning Pain Intervention(s): Monitored during session;Limited activity within patient's tolerance;Repositioned     Hand Dominance Right   Extremity/Trunk Assessment Upper Extremity Assessment Upper Extremity Assessment: RUE deficits/detail RUE Deficits / Details: demonstrates movement in Brunnstrom stage 2 with synergy begining.  He demonstrates moderate swelling of Rt hand.  No functional use of Rt UE noted  RUE Coordination: decreased fine motor;decreased gross motor   Lower Extremity Assessment Lower Extremity Assessment: Defer to PT evaluation RLE Deficits / Details: flickers of AROM noted at hip, otherwise flaccid RLE Sensation: decreased light touch   Cervical / Trunk Assessment Cervical / Trunk Assessment: Other exceptions Cervical / Trunk Exceptions: decreased trunk activation and control Rt trunk    Communication Communication Communication:  Expressive difficulties   Cognition Arousal/Alertness: Awake/alert Behavior During Therapy: Impulsive;WFL for tasks assessed/performed Overall Cognitive Status: Difficult to assess Area of Impairment: Attention;Following commands;Problem solving                   Current Attention Level: Selective   Following Commands: Follows one step commands consistently (with multi modal cues )     Problem Solving: Difficulty sequencing;Requires verbal cues General Comments: Pt will follow one step commands consistently with cues.  He is a bit impulsive.     General Comments  VSS     Exercises     Shoulder Instructions      Home Living Family/patient expects to be discharged to:: Private residence Living Arrangements: Spouse/significant other;Other (Comment) Available Help at Discharge: Family;Available PRN/intermittently Type of Home: House Home Access: Stairs to enter [employer] of Steps: 2 steps at side entrance from car port with no railing, 3 Stairs in back entrance to deck with one railing, 5 Steps to front entrance with bilateral railing.   Home Layout: One level     Bathroom Shower/Tub: Tub/shower unit         Home Equipment: Cane - single point   Additional Comments: spouse via phone states that her sister ordered pt SPC, but that he doesn't use. No other equipment. (info gleaned from chart)  Lives With: Spouse    Prior Functioning/Environment Level of Independence: Independent        Comments: Pt's spouse via phone indicates that he was completely Indep, working nights at [employer]" standing/walking/active work. Has called out x1wk d/t L LE pain.(info gleaned from chart review)        OT Problem List: Decreased strength;Decreased range of motion;Decreased activity tolerance;Impaired balance (sitting and/or standing);Impaired vision/perception;Decreased coordination;Decreased cognition;Decreased  safety awareness;Decreased knowledge of use of DME  or AE;Impaired tone;Impaired UE functional use;Increased edema;Pain      OT Treatment/Interventions: Self-care/ADL training;Therapeutic exercise;DME and/or AE instruction;Therapeutic activities;Patient/family education;Balance training;Neuromuscular education;Manual therapy;Splinting;Cognitive remediation/compensation;Visual/perceptual remediation/compensation    OT Goals(Current goals can be found in the care plan section) Acute Rehab OT Goals Patient Stated Goal: pt unable to sate  OT Goal Formulation: With patient Time For Goal Achievement: 03/12/20 Potential to Achieve Goals: Good ADL Goals Pt Will Perform Eating: with set-up;with supervision;sitting Pt Will Perform Grooming: with set-up;with supervision;sitting Pt Will Perform Upper Body Bathing: with min assist;sitting Pt Will Perform Lower Body Bathing: with mod assist;sit to/from stand Pt Will Transfer to Toilet: with mod assist;stand pivot transfer;bedside commode Pt Will Perform Toileting - Clothing Manipulation and hygiene: with mod assist;sit to/from stand Additional ADL Goal #1: Pt will locate ADL items on his Rt with no more than min cues  OT Frequency: Min 2X/week   Barriers to D/C:    unsure if wife able to provide necessary level of assist        Co-evaluation PT/OT/SLP Co-Evaluation/Treatment: Yes Reason for Co-Treatment: Complexity of the patient's impairments (multi-system involvement);Necessary to address cognition/behavior during functional activity;For patient/therapist safety;To address functional/ADL transfers PT goals addressed during session: Balance;Mobility/safety with mobility;Strengthening/ROM OT goals addressed during session: ADL's and self-care      AM-PAC OT "6 Clicks" Daily Activity     Outcome Measure Help from another person eating meals?: A Little Help from another person taking care of personal grooming?: A Lot Help from another person toileting, which includes using toliet, bedpan, or  urinal?: Total Help from another person bathing (including washing, rinsing, drying)?: A Lot Help from another person to put on and taking off regular upper body clothing?: A Lot Help from another person to put on and taking off regular lower body clothing?: Total 6 Click Score: 11   End of Session Equipment Utilized During Treatment: Oxygen Nurse Communication: Mobility status  Activity Tolerance: Patient tolerated treatment well Patient left: in bed;with call bell/phone within reach;with bed alarm set  OT Visit Diagnosis: Unsteadiness on feet (R26.81);Cognitive communication deficit (R41.841);Hemiplegia and hemiparesis Symptoms and signs involving cognitive functions: Cerebral infarction Hemiplegia - Right/Left: Right Hemiplegia - dominant/non-dominant: Dominant Hemiplegia - caused by: Cerebral infarction                Time: 1610-9604 OT Time Calculation (min): 56 min Charges:  OT General Charges $OT Visit: 1 Visit OT Evaluation $OT Re-eval: 1 Re-eval OT Treatments $Neuromuscular Re-education: 8-22 mins  Eber Jones., OTR/L Acute Rehabilitation Services Pager [phone] Office [phone]   Jeani Hawking M 02/27/2020, 4:40 PM

## 2020-02-27 NOTE — Progress Notes (Signed)
    Subjective  - POD #5, status post bilateral lower extremity thrombectomy and left leg fasciotomy.  He was transferred to [facility] for a significant left brain stroke  Currently receiving pulmonary toilet.    Physical Exam:  Cognitive deficit from stroke. Both calves remain soft without evidence of compartment syndrome. Bilateral lower extremities are warm and well-perfused       Assessment/Plan:  POD #5  Stable from vascular perspective, now on aspirin and Plavix.  I will remove his Prevena wound VAC on the right tomorrow.  Continue ongoing stroke care.  Wells Kenny Rea 02/27/2020 1:04 PM --  Vitals:   02/27/20 1200 02/27/20 1300  BP: (!) 180/91   Pulse: 82 79  Resp: (!) 27 (!) 30  Temp: 99.6 F (37.6 C)   SpO2: 94% 95%    Intake/Output Summary (Last 24 hours) at 02/27/2020 1304 Last data filed at 02/27/2020 1300 Gross per 24 hour  Intake 1056.13 ml  Output 250 ml  Net 806.13 ml     Laboratory CBC    Component Value Date/Time   WBC 9.5 02/27/2020 0149   HGB 10.1 (L) 02/27/2020 0149   HGB 16.8 08/14/2012 1008   HCT 32.1 (L) 02/27/2020 0149   HCT 49.0 08/14/2012 1008   PLT 212 02/27/2020 0149   PLT 291 08/14/2012 1008    BMET    Component Value Date/Time   NA 160 (H) 02/27/2020 0734   NA 138 08/14/2012 1008   K 3.4 (L) 02/27/2020 0149   K 3.9 08/14/2012 1008   CL 124 (H) 02/27/2020 0149   CL 103 08/14/2012 1008   CO2 27 02/27/2020 0149   CO2 27 08/14/2012 1008   GLUCOSE 150 (H) 02/27/2020 0149   GLUCOSE 84 08/14/2012 1008   BUN 19 02/27/2020 0149   BUN 10 08/14/2012 1008   CREATININE 0.76 02/27/2020 0149   CREATININE 0.70 08/14/2012 1008   CALCIUM 8.8 (L) 02/27/2020 0149   CALCIUM 9.3 08/14/2012 1008   GFRNONAA >60 02/27/2020 0149   GFRNONAA >60 08/14/2012 1008   GFRAA >60 02/27/2020 0149   GFRAA >60 08/14/2012 1008    COAG Lab Results  Component Value Date   INR 1.1 02/22/2020   INR 0.9 09/17/2019   No results found for:  PTT  Antibiotics Anti-infectives (From admission, onward)   None       V. Charlena Cross, M.D., [facility] [place] Office: [phone] Pager:  [phone]

## 2020-02-27 NOTE — Evaluation (Signed)
Physical Therapy Evaluation Patient Details Name: Jonathan [name] MRN: [id number] DOB: Jul 07, 1961 Today's Date: 02/27/2020   History of Present Illness  Pt is 59 y/o M with PMH: DDD, HTN, HLD, h/o CVA with some acute aphasia (wife reports his speech was normal before this admission), and PAD s/p stent in 2017. Presented with L LE pain. Now s/p angio with PCI and stent placement on 8/6 and revascularization of L LE on 8/7. Extubated 02/23/20. Of note, during OT assessment, significant R sided weakness and significant aphasia present. OT notified RN immediately who called code stroke.  Clinical Impression  Pt presents to PT with deficits in functional mobility, gait, balance, strength, power, cognition, and communication. Pt with R hemiparesis and impaired tone, noted to have some R shoulder sublux with upright activity. Pt with significant expressive aphasia, with garbled speech or repeating "want some" during session. Pt requires significant assistance to mobilize in bed, maintain sitting balance, and to stand this session. Pt will benefit from continued acute PT POC to reduce falls risk and to improve functional mobility and gait quality. PT recommends CIR at this time as the pt demonstrates the potential to make significant gains with high intensity inpatient PT services.    Follow Up Recommendations CIR    Equipment Recommendations  Wheelchair (measurements PT);Wheelchair cushion (measurements PT);Hospital bed (mechanical lift if home today)    Recommendations for Other Services Rehab consult     Precautions / Restrictions Precautions Precautions: Fall Restrictions Weight Bearing Restrictions: No      Mobility  Bed Mobility Overal bed mobility: Needs Assistance Bed Mobility: Supine to Sit;Sit to Supine     Supine to sit: Mod assist;+2 for physical assistance;+2 for safety/equipment Sit to supine: Max assist;+2 for physical assistance;+2 for safety/equipment   General bed  mobility comments: Pt required assist to initiate Rt LE movement, assist to lift trunk, and assist to scoot to EOB   Transfers Overall transfer level: Needs assistance Equipment used: 2 person hand held assist Transfers: Sit to/from Stand Sit to Stand: Max assist;+2 physical assistance;+2 safety/equipment         General transfer comment: Pt initially required mod A +2 for sit to stand, but attempted x 2 more times, and pt required max A +2 and demonstrated difficulty maintaining full hip and knee extension - required facilitation, and fatigued quickly returning to sitting abruptly   Ambulation/Gait                Stairs            Wheelchair Mobility    Modified Rankin (Stroke Patients Only)       Balance Overall balance assessment: Needs assistance Sitting-balance support: Feet supported;Single extremity supported Sitting balance-Leahy Scale: Poor Sitting balance - Comments: requires UE support and min guard to mod A  Postural control: Posterior lean;Right lateral lean Standing balance support: Bilateral upper extremity supported Standing balance-Leahy Scale: Zero Standing balance comment: Pt requires max A +2 to maintain standing balance for 15-30 seconds                              Pertinent Vitals/Pain Pain Assessment: Faces Faces Pain Scale: Hurts even more Pain Location: scrotum  Pain Descriptors / Indicators: Grimacing;Guarding;Moaning Pain Intervention(s): Monitored during session;Limited activity within patient's tolerance;Repositioned    Home Living Family/patient expects to be discharged to:: Private residence Living Arrangements: Spouse/significant other;Other (Comment) Available Help at Discharge: Family;Available PRN/intermittently Type of Home: House  Home Access: Stairs to enter   Entrance Stairs-Number of Steps: 2 steps at side entrance from car port with no railing, 3 Stairs in back entrance to deck with one railing, 5 Steps  to front entrance with bilateral railing. Home Layout: One level Home Equipment: Cane - single point Additional Comments: spouse via phone states that her sister ordered pt SPC, but that he doesn't use. No other equipment. (info gleaned from chart)    Prior Function Level of Independence: Independent         Comments: Pt's spouse via phone indicates that he was completely Indep, working nights at [employer]" standing/walking/active work. Has called out x1wk d/t L LE pain.(info gleaned from chart review)     Hand Dominance   Dominant Hand: Right    Extremity/Trunk Assessment   Upper Extremity Assessment Upper Extremity Assessment: RUE deficits/detail RUE Deficits / Details: demonstrates movement in Brunnstrom stage 2 with synergy begining.  He demonstrates moderate swelling of Rt hand.  No functional use of Rt UE noted  RUE Coordination: decreased fine motor;decreased gross motor    Lower Extremity Assessment Lower Extremity Assessment: Defer to PT evaluation RLE Deficits / Details: flickers of AROM noted at hip, otherwise flaccid RLE Sensation: decreased light touch    Cervical / Trunk Assessment Cervical / Trunk Assessment: Other exceptions Cervical / Trunk Exceptions: decreased trunk activation and control Rt trunk   Communication   Communication: Expressive difficulties  Cognition Arousal/Alertness: Awake/alert Behavior During Therapy: Impulsive;WFL for tasks assessed/performed Overall Cognitive Status: Difficult to assess Area of Impairment: Attention;Following commands;Problem solving                   Current Attention Level: Selective   Following Commands: Follows one step commands consistently (with multi modal cues )     Problem Solving: Difficulty sequencing;Requires verbal cues General Comments: Pt will follow one step commands consistently with cues.  He is a bit impulsive.        General Comments General comments (skin integrity, edema, etc.):  VSS     Exercises     Assessment/Plan    PT Assessment Patient needs continued PT services  PT Problem List Decreased strength;Decreased activity tolerance;Decreased balance;Decreased mobility;Decreased cognition;Decreased coordination;Decreased knowledge of use of DME;Decreased safety awareness;Decreased knowledge of precautions;Cardiopulmonary status limiting activity;Pain;Impaired sensation       PT Treatment Interventions DME instruction;Gait training;Stair training;Functional mobility training;Therapeutic activities;Therapeutic exercise;Balance training;Neuromuscular re-education;Cognitive remediation;Patient/family education    PT Goals (Current goals can be found in the Care Plan section)  Acute Rehab PT Goals Patient Stated Goal: pt unable to sate  PT Goal Formulation: With patient Time For Goal Achievement: 03/12/20 Potential to Achieve Goals: Fair    Frequency Min 4X/week   Barriers to discharge        Co-evaluation PT/OT/SLP Co-Evaluation/Treatment: Yes Reason for Co-Treatment: Complexity of the patient's impairments (multi-system involvement);Necessary to address cognition/behavior during functional activity;For patient/therapist safety;To address functional/ADL transfers PT goals addressed during session: Balance;Mobility/safety with mobility;Strengthening/ROM OT goals addressed during session: ADL's and self-care       AM-PAC PT "6 Clicks" Mobility  Outcome Measure Help needed turning from your back to your side while in a flat bed without using bedrails?: A Lot Help needed moving from lying on your back to sitting on the side of a flat bed without using bedrails?: A Lot Help needed moving to and from a bed to a chair (including a wheelchair)?: Total Help needed standing up from a chair using your arms (e.g.,  wheelchair or bedside chair)?: Total Help needed to walk in hospital room?: Total Help needed climbing 3-5 steps with a railing? : Total 6 Click Score:  8    End of Session Equipment Utilized During Treatment: Gait belt;Oxygen Activity Tolerance: Patient tolerated treatment well Patient left: in bed;with call bell/phone within reach;with bed alarm set Nurse Communication: Mobility status;Need for lift equipment PT Visit Diagnosis: Unsteadiness on feet (R26.81);Other abnormalities of gait and mobility (R26.89);Muscle weakness (generalized) (M62.81);Difficulty in walking, not elsewhere classified (R26.2);Other symptoms and signs involving the nervous system (R29.898);Pain Pain - part of body:  (groin)    Time: 7425-9563 PT Time Calculation (min) (ACUTE ONLY): 29 min   Charges:   PT Evaluation $PT Eval Moderate Complexity: 1 Mod          Arlyss Gandy, PT, DPT Acute Rehabilitation Pager: [phone]   Arlyss Gandy 02/27/2020, 4:41 PM

## 2020-02-27 NOTE — Progress Notes (Signed)
STROKE TEAM PROGRESS NOTE   SUBJECTIVE (INTERVAL HISTORY) No family is at bedside. Pt initially sleeping but readily arousable on voice and maintained awake throughout the encounter. He still has global aphasia with word salad but able to repeat "I am" "bye". Still has right hemiplegia with only right LE 2-/5. He still has gurgly sound, put on chest PT and 3% neb. Currently not in afib. His Na 160, put on 1/2 NS. Will repeat CT in am. BP still at high end, increased metoprolol dose.   OBJECTIVE Temp:  [98.9 F (37.2 C)-100.3 F (37.9 C)] 100 F (37.8 C) (08/12 0800) Pulse Rate:  [64-87] 82 (08/12 1000) Cardiac Rhythm: Normal sinus rhythm (08/11 2000) Resp:  [23-39] 25 (08/12 1000) BP: (108-182)/(66-126) 177/91 (08/12 1000) SpO2:  [92 %-98 %] 96 % (08/12 1000) FiO2 (%):  [28 %] 28 % (08/12 0757)  Recent Labs  Lab 02/23/20 0017 02/23/20 0346 02/23/20 0727 02/23/20 1128 02/23/20 1553  GLUCAP 164* 136* 130* 138* 146*   Recent Labs  Lab 02/23/20 1741 02/23/20 2113 02/24/20 0747 02/24/20 1341 02/25/20 0424 02/25/20 0630 02/26/20 0825 02/26/20 1520 02/26/20 1918 02/27/20 0149 02/27/20 0734  NA 139   < > 145   < > 152*   < > 154*  156* 159* 157* 160* 160*  K 3.3*  --  3.9  --  3.2*  --  2.7*  --   --  3.4*  --   CL 105  --  112*  --  116*  --  118*  --   --  124*  --   CO2 24  --  23  --  22  --  25  --   --  27  --   GLUCOSE 159*  --  135*  --  155*  --  253*  --   --  150*  --   BUN 11  --  8  --  10  --  15  --   --  19  --   CREATININE 0.68  --  0.66  --  0.72  --  0.75  --   --  0.76  --   CALCIUM 7.9*   < > 8.0*  --  8.7*  --  8.7*  --   --  8.8*  --   MG 1.9  --   --   --  2.2  --   --   --   --   --   --   PHOS 1.9*  --   --   --   --   --   --   --   --   --   --    < > = values in this interval not displayed.   Recent Labs  Lab 02/22/20 0210 02/23/20 1741 02/25/20 0424 02/27/20 0149  AST 25 203* 163* 96*  ALT 32 46* 56* 75*  ALKPHOS 37* 34* 42 37*   BILITOT 0.9 0.6 1.1 0.8  PROT 5.5* 4.6* 6.4* 5.6*  ALBUMIN 3.2* 2.3* 2.8* 2.5*   Recent Labs  Lab 02/23/20 1741 02/24/20 1025 02/25/20 0424 02/26/20 0825 02/27/20 0149  WBC 10.1 10.0 11.9* 9.0 9.5  HGB 7.4* 9.4* 11.5* 10.4* 10.1*  HCT 22.3* 28.3* 34.8* 32.9* 32.1*  MCV 93.3 94.0 94.8 97.6 99.1  PLT PLATELET CLUMPS NOTED ON SMEAR, UNABLE TO ESTIMATE 117* 160 187 212   No results for input(s): CKTOTAL, CKMB, CKMBINDEX, TROPONINI in the last 168 hours. No results  for input(s): LABPROT, INR in the last 72 hours. No results for input(s): COLORURINE, LABSPEC, PHURINE, GLUCOSEU, HGBUR, BILIRUBINUR, KETONESUR, PROTEINUR, UROBILINOGEN, NITRITE, LEUKOCYTESUR in the last 72 hours.  Invalid input(s): APPERANCEUR     Component Value Date/Time   CHOL 132 02/24/2020 0748   TRIG 129 02/24/2020 0748   HDL 35 (L) 02/24/2020 0748   CHOLHDL 3.8 02/24/2020 0748   VLDL 26 02/24/2020 0748   LDLCALC 71 02/24/2020 0748   Lab Results  Component Value Date   HGBA1C 6.2 (H) 02/23/2020   No results found for: LABOPIA, COCAINSCRNUR, LABBENZ, AMPHETMU, THCU, LABBARB  No results for input(s): ETH in the last 168 hours.  I have personally reviewed the radiological images below and agree with the radiology interpretations.  CT HEAD WO CONTRAST  Result Date: 02/26/2020 CLINICAL DATA:  Follow-up examination for acute stroke. EXAM: CT HEAD WITHOUT CONTRAST TECHNIQUE: Contiguous axial images were obtained from the base of the skull through the vertex without intravenous contrast. COMPARISON:  Prior CT from 02/24/2020. FINDINGS: Brain: Continued interval evolution of large left MCA territory infarct is seen, overall stable in size and distribution from previous. Associated regional mass effect with partial effacement of the left lateral ventricle and up to 5 mm of left-to-right shift, slightly worsened from previous. No hydrocephalus or ventricular trapping. Basilar cisterns remain patent. No evidence for  hemorrhagic transformation. No other new acute large vessel territory infarct. No intracranial hemorrhage. No extra-axial fluid collection. Vascular: No hyperdense vessel. Scattered vascular calcifications noted within the carotid siphons. Skull: Scalp soft tissues within normal limits. Calvarium unchanged. Sinuses/Orbits: Globes and orbital soft tissues demonstrate no acute finding. Paranasal sinuses and mastoid air cells remain largely clear. Other: None. IMPRESSION: 1. Continued interval evolution of large left MCA territory infarct, overall stable in size and distribution from previous. Associated regional mass effect with up to 5 mm of left-to-right shift, slightly worsened from previous. No hydrocephalus or ventricular trapping. No evidence for hemorrhagic transformation. 2. No other new acute intracranial abnormality. Electronically Signed   By: Rise Mu M.D.   On: 02/26/2020 02:07   CT HEAD WO CONTRAST  Result Date: 02/24/2020 CLINICAL DATA:  Stroke follow-up EXAM: CT HEAD WITHOUT CONTRAST TECHNIQUE: Contiguous axial images were obtained from the base of the skull through the vertex without intravenous contrast. COMPARISON:  02/23/2020 head CT. FINDINGS: Brain: Sequela of evolving left MCA territory infarct. 3-4 mm rightward midline shift is unchanged. No intracranial hemorrhage. No mass lesion. No ventriculomegaly or extra-axial fluid collection. Vascular: No hyperdense vessel. Bilateral skull base atherosclerotic calcifications. Skull: Negative for fracture or focal lesion. Sinuses/Orbits: Normal orbits. Soft tissue impacted left maxillary molar, unchanged. Mild right sphenoid sinus mucosal thickening. No mastoid effusion. Other: None. IMPRESSION: Evolving left MCA territory infarct with unchanged 3-4 mm rightward midline shift. No intracranial hemorrhage. Electronically Signed   By: Stana Bunting M.D.   On: 02/24/2020 09:29   CT HEAD WO CONTRAST  Result Date: 02/23/2020 CLINICAL  DATA:  Stroke follow-up EXAM: CT HEAD WITHOUT CONTRAST TECHNIQUE: Contiguous axial images were obtained from the base of the skull through the vertex without intravenous contrast. COMPARISON:  Head CT 02/21/2020 FINDINGS: Brain: Large area of cytotoxic edema throughout the left MCA territory. No acute hemorrhage. 3 mm rightward midline shift. Vascular: Atherosclerotic calcification of the internal carotid arteries at the skull base. Skull: Normal. Negative for fracture or focal lesion. Sinuses/Orbits: No acute finding. Other: None. IMPRESSION: 1. Slightly increased cytotoxic edema within the left MCA territory. 2. No acute  hemorrhage. 3. 3 mm rightward midline shift. Electronically Signed   By: Deatra Robinson M.D.   On: 02/23/2020 22:00   US Carotid Bilateral  Result Date: 02/23/2020 CLINICAL DATA:  Slurred speech.  Carotid stenosis. EXAM: BILATERAL CAROTID DUPLEX ULTRASOUND TECHNIQUE: Wallace Cullens scale imaging, color Doppler and duplex ultrasound were performed of bilateral carotid and vertebral arteries in the neck. COMPARISON:  CTA neck 02/23/2020 FINDINGS: Criteria: Quantification of carotid stenosis is based on velocity parameters that correlate the residual internal carotid diameter with NASCET-based stenosis levels, using the diameter of the distal internal carotid lumen as the denominator for stenosis measurement. The following velocity measurements were obtained: RIGHT ICA: 117/63 cm/sec CCA: 130/38 cm/sec SYSTOLIC ICA/CCA RATIO:  0.9 ECA: 220 cm/sec LEFT ICA: Occluded CCA: 75/10 cm/sec ECA: 195 cm/sec RIGHT CAROTID ARTERY: Mild atherosclerotic disease in the right common carotid artery. Heterogeneous plaque at the right carotid bulb. External carotid artery is patent with normal waveform. Atherosclerotic plaque in the proximal internal carotid artery. Normal waveforms and velocities in the internal carotid artery. RIGHT VERTEBRAL ARTERY: Antegrade flow and normal waveform in the right vertebral artery. LEFT  CAROTID ARTERY: Left common carotid artery is patent with intimal thickening. Left carotid artery is patent. External carotid artery is patent with normal waveform. 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 No flow in the mid or distal left internal carotid artery. LEFT VERTEBRAL ARTERY: Antegrade flow and normal waveform in the left vertebral artery. IMPRESSION: 1. 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 Findings correspond with recent CTA findings. 2. Atherosclerotic plaque involving the carotid arteries. Estimated degree of stenosis in the right internal carotid artery is less than 50%. 3. Patent vertebral arteries with antegrade flow. Electronically Signed   By: Richarda Overlie M.D.   On: 02/23/2020 14:42   PERIPHERAL VASCULAR CATHETERIZATION  Result Date: 02/21/2020 See op note  CT CEREBRAL PERFUSION W CONTRAST  Result Date: 02/23/2020 CLINICAL DATA:  Neuro deficit, acute, stroke suspected. EXAM: CT ANGIOGRAPHY HEAD AND NECK CT PERFUSION BRAIN TECHNIQUE: Multidetector CT imaging of the head and neck was performed using the standard protocol during bolus administration of intravenous contrast. Multiplanar CT image reconstructions and MIPs were obtained to evaluate the vascular anatomy. Carotid stenosis measurements (when applicable) are obtained utilizing NASCET criteria, using the distal internal carotid diameter as the denominator. Multiphase CT imaging of the brain was performed following IV bolus contrast injection. Subsequent parametric perfusion maps were calculated using RAPID software. CONTRAST:  OMNIPAQUE IOHEXOL 350 MG/ML SOLN COMPARISON:  Noncontrast head CT performed earlier the same day. FINDINGS: CTA NECK FINDINGS Aortic arch: The left vertebral artery arises directly from the aortic arch. Atherosclerotic plaque within the visualized aortic arch and proximal major branch vessels of the neck. No hemodynamically significant innominate or proximal subclavian  artery stenosis. Right carotid system: The CCA and ICA are patent within the neck. Atherosclerotic plaque within these vessels. Most notably there is at least moderate calcified plaque within the carotid bifurcation. Exact quantification of stenosis within the proximal right ICA is difficult due to motion artifact and blooming artifact of calcified plaque at this site. Additionally, there is 50-60% stenosis of the mid cervical ICA. Left carotid system: The CCA is patent to the bifurcation. 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 Vertebral arteries: The dominant right vertebral artery is patent throughout the neck. Moderate/severe atherosclerotic narrowing within the V1 right vertebral artery. The non dominant left vertebral artery  is developmentally diminutive, but patent throughout the neck Skeleton: Reversal of the expected cervical lordosis. Cervical spondylosis with multilevel disc space narrowing, posterior disc osteophytes and uncovertebral hypertrophy. Other neck: No neck mass or cervical lymphadenopathy. Upper chest: No consolidation within the imaged lung apices. 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 There is minimal reconstitution of flow within the supraclinoid left ICA. The M1 and M2 left middle cerebral arteries are patent, although with asymmetrically decreased opacification as compared to the right side. Additionally, there is a moderate to moderately severe focal stenosis within a superior division proximal M2 left MCA branch (series 8, image 17). The intracranial right internal carotid artery is patent. However, there are sites of severe stenosis within the cavernous and paraclinoid segments. The M1 right middle cerebral artery is patent  without significant stenosis. No right M2 proximal branch occlusion is identified. There is a moderate to moderately severe stenosis within an inferior division mid M2 right MCA branch vessel (series 9, image 19). The A1 left anterior cerebral artery is hypoplastic. The A1 right anterior cerebral artery is dominant. The A2 and more distal anterior cerebral arteries are patent. However, there are multifocal high-grade stenoses within the bilateral A2 anterior cerebral arteries. Posterior circulation: The non dominant intracranial left vertebral artery is developmentally diminutive, but patent and terminates as the left PICA. The dominant intracranial right vertebral artery is patent, although with sites of moderate stenosis. The basilar artery is patent. The posterior cerebral arteries are patent. However, there is a high-grade focal stenosis within the P2 left PCA and high-grade focal stenosis within the right PCA at the P2/P3 junction. Venous sinuses: Within limitations of contrast timing, no convincing thrombus. Anatomic variants: Posterior communicating arteries are hypoplastic or absent bilaterally. Review of the MIP images confirms the above findings CT Brain Perfusion Findings: ASPECTS: 4 CBF (<30%) Volume: 36mL (left MCA vascular territory) Perfusion (Tmax>6.0s) volume: (left MCA vascular territory) Mismatch Volume: 34mL Infarction Location:Left MCA vascular territory. These results were called by telephone at the time of interpretation on 02/23/2020 at 12:40 pm to provider [facility] , who verbally acknowledged these results. IMPRESSION: CTA neck: 1. 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 2. There is at least moderate calcified plaque at the right carotid bifurcation. There is stenosis at the origin of the cervical right vertebral artery which is poorly quantified due to motion degradation and blooming artifact from calcified  plaque at this site. Carotid artery duplex is recommended for further evaluation. Additionally, there is apparent 50-60% stenosis of the mid cervical right ICA. 3. Moderate/severe atherosclerotic narrowing within the Dom V1 right vertebral artery. 4. The left vertebral artery is developmentally diminutive, but patent throughout the neck. CTA head: 1. The left internal carotid artery remains occluded intracranially throughout the siphon region. There is minimal reconstitution of flow within the supraclinoid left ICA. The M1 and M2 left middle cerebral arteries are patent, although with asymmetrically decreased opacification as compared to the right side. Additionally, there is a moderate to moderately severe focal stenosis within a superior division proximal M2 left MCA branch. 2. Additional intracranial atherosclerotic disease with multifocal stenoses, most notably as follows. 3. High-grade stenoses within the cavernous/paraclinoid right ICA. 4. Moderate to moderately severe focal stenosis within an inferior division  mid M2 right MCA branch vessel. 5. Moderate stenosis within the V4 right vertebral artery. 6. High-grade stenosis within the P2 left PCA. 7. High-grade stenosis within the right PCA at the P2/P3 junction. 8. Multifocal high-grade stenoses within the A2 anterior cerebral arteries bilaterally. CT perfusion head: The perfusion software identifies an 89 mL core infarct within the left MCA vascular territory. The perfusion software identifies a 149 mL region of critically hypoperfused parenchyma within the left MCA vascular territory. Reported mismatch volume 60 mL. Electronically Signed   By: Jackey Loge DO   On: 02/23/2020 12:53   DG CHEST PORT 1 VIEW  Result Date: 02/24/2020 CLINICAL DATA:  59 year old male with shortness of breath. EXAM: PORTABLE CHEST 1 VIEW COMPARISON:  Chest radiograph dated 02/22/2020. FINDINGS: Status post removal of the endotracheal and enteric tube. There is diffuse  interstitial densities which may be chronic or represent edema or atypical pneumonia. No large pleural effusion. No pneumothorax. Stable cardiac silhouette. No acute osseous pathology. IMPRESSION: Interval removal of the endotracheal and enteric tubes. Electronically Signed   By: Elgie Collard M.D.   On: 02/24/2020 19:11   Portable Chest x-ray  Result Date: 02/22/2020 CLINICAL DATA:  Left cerebral artery and polyp stroke, postop bilateral iliofemoral thrombectomy, ETT EXAM: PORTABLE CHEST 1 VIEW COMPARISON:  01/12/2013 FINDINGS: Lungs are essentially clear.  No pleural effusion or pneumothorax. The heart is normal in size. Endotracheal tube terminates 5 cm above the carina. Enteric tube terminates in the proximal gastric body. IMPRESSION: Endotracheal tube terminates 5 cm above the carina. Enteric tube terminates in the proximal gastric body. Electronically Signed   By: Charline Bills M.D.   On: 02/22/2020 08:54   DG Swallowing Func-Speech Pathology  Result Date: 02/24/2020 Objective Swallowing Evaluation: Type of Study: MBS-Modified Barium Swallow Study  Patient Details Name: Jonathan [name] MRN: [id number] Date of Birth: 1960/11/03 Today's Date: 02/24/2020 Time: SLP Start Time (ACUTE ONLY): 1310 -SLP Stop Time (ACUTE ONLY): 1340 SLP Time Calculation (min) (ACUTE ONLY): 30 min Past Medical History: Past Medical History: Diagnosis Date . Aphasia S/P CVA  . DDD (degenerative disc disease), cervical  . ED (erectile dysfunction)  . Hyperlipidemia  . Hypertension  . Peripheral arterial disease (HCC)  . Stroke [facility])  Past Surgical History: Past Surgical History: Procedure Laterality Date . FEMORAL-FEMORAL BYPASS GRAFT Bilateral 02/22/2020  Procedure: bilateral ileofemoral thrombectomy, left popliteal exposure and thrombectomy, four compartment fasciotomy, left femoral endarterectomy with patch angioplasty;  Surgeon: Nada Libman, MD;  Location: [facility] ORS;  Service: Vascular;  Laterality: Bilateral; . LOWER  EXTREMITY ANGIOGRAPHY Left 02/21/2020  Procedure: LOWER EXTREMITY ANGIOGRAPHY;  Surgeon: Renford Dills, MD;  Location: [facility] INVASIVE CV LAB;  Service: Cardiovascular;  Laterality: Left; . PERIPHERAL VASCULAR CATHETERIZATION N/A 07/21/2015  Procedure: Abdominal Aortogram w/Lower Extremity;  Surgeon: Renford Dills, MD;  Location: [facility] INVASIVE CV LAB;  Service: Cardiovascular;  Laterality: N/A; . PERIPHERAL VASCULAR CATHETERIZATION  07/21/2015  Procedure: Lower Extremity Intervention;  Surgeon: Renford Dills, MD;  Location: [facility] INVASIVE CV LAB;  Service: Cardiovascular;; HPI: 59 y.o. male with a history of hypertension, hyperlipidemia, CVA, severe PAD  who presented with acute critical limb ischemia secondary to occlusion of the iliac artery who underwent stenting on 8/6 at [facility].  He was taken back to the OR on 8/7 found to have acute thrombosis of bilateral iliac stents underwent thrombectomy and endarterectomy;  fasciotomies of the left leg. Extubated 8/8. Developed right hemiplegia and aphasia morning of 8/8. CTH: large territory infarct  left MCA. Pt transferred to [facility] same day.   Subjective: alert, aphasic Assessment / Plan / Recommendation CHL IP CLINICAL IMPRESSIONS 02/24/2020 Clinical Impression Pt presents with a primary oral dysphagia c/b impaired bolus cohesion and control over release into the pharynx.  There was decreased sensation on the right, with barium residue in right lateral and anterior sulci and limited awareness of its presence.  Pharyngeal function was largely intact, with adequate pharyngeal squeeze, no residue post-swallow, and effective laryngeal vestibule closure.  Occasionally, thin and nectar thick barium reached the pyriforms for a delayed period of time before the swallow was triggered.  On other occasions, the swallow trigger was timely.  There was one incident of penetration to the vocal folds when a bolus of nectar thick liquid prematurely escaped the oral cavity and entered  the laryngeal vestibule without eliciting a response.  Pt's impaired sensation appears to be the primary concern and puts him at greater risk of aspiration.  For now, recommend starting a dysphagia 1 diet, thin liquids from cup only - NO STRAWS; give meds whole in applesauce/pudding.  Due to pt's impulsivity, difficulty following commands, and poor sensation/awareness on right side of mouth/throat, he will require careful supervision during meals.  SLP will follow for safety/diet progression and therapeutic training.   SLP Visit Diagnosis Dysphagia, oropharyngeal phase (R13.12) Attention and concentration deficit following -- Frontal lobe and executive function deficit following -- Impact on safety and function Mild aspiration risk   CHL IP TREATMENT RECOMMENDATION 02/24/2020 Treatment Recommendations Therapy as outlined in treatment plan below   Prognosis 02/24/2020 Prognosis for Safe Diet Advancement Good Barriers to Reach Goals -- Barriers/Prognosis Comment -- CHL IP DIET RECOMMENDATION 02/24/2020 SLP Diet Recommendations Dysphagia 1 (Puree) solids;Thin liquid Liquid Administration via Cup;No straw Medication Administration Whole meds with puree Compensations Minimize environmental distractions;Slow rate;Small sips/bites Postural Changes --   CHL IP OTHER RECOMMENDATIONS 02/24/2020 Recommended Consults -- Oral Care Recommendations Oral care BID Other Recommendations --   CHL IP FOLLOW UP RECOMMENDATIONS 02/24/2020 Follow up Recommendations Inpatient Rehab   CHL IP FREQUENCY AND DURATION 02/24/2020 Speech Therapy Frequency (ACUTE ONLY) min 3x week Treatment Duration 2 weeks      CHL IP ORAL PHASE 02/24/2020 Oral Phase Impaired Oral - Pudding Teaspoon -- Oral - Pudding Cup -- Oral - Honey Teaspoon -- Oral - Honey Cup -- Oral - Nectar Teaspoon -- Oral - Nectar Cup -- Oral - Nectar Straw Right anterior bolus loss;Weak lingual manipulation;Reduced posterior propulsion;Holding of bolus;Right pocketing in lateral sulci;Pocketing in  anterior sulcus;Lingual/palatal residue;Piecemeal swallowing;Decreased bolus cohesion Oral - Thin Teaspoon -- Oral - Thin Cup Right anterior bolus loss;Weak lingual manipulation;Reduced posterior propulsion;Holding of bolus;Right pocketing in lateral sulci;Pocketing in anterior sulcus;Lingual/palatal residue;Piecemeal swallowing;Decreased bolus cohesion Oral - Thin Straw Right anterior bolus loss;Weak lingual manipulation;Reduced posterior propulsion;Holding of bolus;Right pocketing in lateral sulci;Pocketing in anterior sulcus;Lingual/palatal residue;Piecemeal swallowing;Decreased bolus cohesion Oral - Puree Right anterior bolus loss;Weak lingual manipulation;Reduced posterior propulsion;Holding of bolus;Right pocketing in lateral sulci;Pocketing in anterior sulcus;Lingual/palatal residue;Piecemeal swallowing;Decreased bolus cohesion Oral - Mech Soft -- Oral - Regular Right anterior bolus loss;Weak lingual manipulation;Reduced posterior propulsion;Holding of bolus;Right pocketing in lateral sulci;Pocketing in anterior sulcus;Lingual/palatal residue;Piecemeal swallowing;Decreased bolus cohesion Oral - Multi-Consistency -- Oral - Pill -- Oral Phase - Comment --  CHL IP PHARYNGEAL PHASE 02/24/2020 Pharyngeal Phase Impaired Pharyngeal- Pudding Teaspoon -- Pharyngeal -- Pharyngeal- Pudding Cup -- Pharyngeal -- Pharyngeal- Honey Teaspoon -- Pharyngeal -- Pharyngeal- Honey Cup -- Pharyngeal -- Pharyngeal- Nectar Teaspoon -- Pharyngeal -- Pharyngeal-  Nectar Cup -- Pharyngeal -- Pharyngeal- Nectar Straw Delayed swallow initiation-pyriform sinuses;Delayed swallow initiation-vallecula;Penetration/Aspiration before swallow Pharyngeal Material enters airway, CONTACTS cords and not ejected out Pharyngeal- Thin Teaspoon -- Pharyngeal -- Pharyngeal- Thin Cup Delayed swallow initiation-pyriform sinuses;Delayed swallow initiation-vallecula Pharyngeal -- Pharyngeal- Thin Straw Delayed swallow initiation-vallecula;Delayed swallow  initiation-pyriform sinuses Pharyngeal -- Pharyngeal- Puree Delayed swallow initiation-vallecula;Delayed swallow initiation-pyriform sinuses Pharyngeal -- Pharyngeal- Mechanical Soft -- Pharyngeal -- Pharyngeal- Regular Delayed swallow initiation-vallecula Pharyngeal -- Pharyngeal- Multi-consistency -- Pharyngeal -- Pharyngeal- Pill -- Pharyngeal -- Pharyngeal Comment --  CHL IP CERVICAL ESOPHAGEAL PHASE 02/24/2020 Cervical Esophageal Phase WFL Pudding Teaspoon -- Pudding Cup -- Honey Teaspoon -- Honey Cup -- Nectar Teaspoon -- Nectar Cup -- Nectar Straw -- Thin Teaspoon -- Thin Cup -- Thin Straw -- Puree -- Mechanical Soft -- Regular -- Multi-consistency -- Pill -- Cervical Esophageal Comment -- Blenda Mounts Laurice 02/24/2020, 2:58 PM              ECHOCARDIOGRAM COMPLETE  Result Date: 02/24/2020    ECHOCARDIOGRAM REPORT   Patient Name:   Jonathan [name] Date of Exam: 02/24/2020 Medical Rec #:  [id number]          Height:       67.0 in Accession #:    [id number]         Weight:       175.5 lb Date of Birth:  1961/06/23          BSA:          1.913 m Patient Age:    59 years           BP:           159/96 mmHg Patient Gender: M                  HR:           106 bpm. Exam Location:  Inpatient Procedure: 2D Echo and Intracardiac Opacification Agent Indications:    Stroke 434.91 / I163.9  History:        Patient has no prior history of Echocardiogram examinations.                 Peripheral arterial disease, underwent stenting, complicated by                 thrombosis requiring thrombectomy and LLE fasciotomy. Developed                 left facial droop. 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  Sonographer:    Leta Jungling RDCS Referring Phys: [id number] Vraj Denardo IMPRESSIONS  1. Left ventricular ejection fraction, by estimation, is 60 to 65%. Left ventricular ejection fraction by 2D MOD biplane is 63.2 %. The left ventricle has normal function. The left ventricle has no regional wall motion abnormalities. Left ventricular  diastolic parameters are indeterminate.  2. Right ventricular systolic function is normal. The right ventricular size is normal. There is normal pulmonary artery systolic pressure. The estimated right ventricular systolic pressure is 30.9 mmHg.  3. The mitral valve is grossly normal. No evidence of mitral valve regurgitation. No evidence of mitral stenosis.  4. The aortic valve is tricuspid. Aortic valve regurgitation is not visualized. No aortic stenosis is present.  5. The inferior vena cava is normal in size with greater than 50% respiratory variability, suggesting right atrial pressure of 3 mmHg. Conclusion(s)/Recommendation(s): No intracardiac source of embolism detected on this transthoracic study. A transesophageal echocardiogram is recommended to exclude cardiac source of  embolism if clinically indicated. FINDINGS  Left Ventricle: Left ventricular ejection fraction, by estimation, is 60 to 65%. Left ventricular ejection fraction by 2D MOD biplane is 63.2 %. The left ventricle has normal function. The left ventricle has no regional wall motion abnormalities. Definity contrast agent was given IV to delineate the left ventricular endocardial borders. The left ventricular internal cavity size was normal in size. There is no left ventricular hypertrophy. Left ventricular diastolic parameters are indeterminate. Right Ventricle: The right ventricular size is normal. No increase in right ventricular wall thickness. Right ventricular systolic function is normal. There is normal pulmonary artery systolic pressure. The tricuspid regurgitant velocity is 2.64 m/s, and  with an assumed right atrial pressure of 3 mmHg, the estimated right ventricular systolic pressure is 30.9 mmHg. Left Atrium: Left atrial size was normal in size. Right Atrium: Right atrial size was normal in size. Pericardium: Trivial pericardial effusion is present. Presence of pericardial fat pad. Mitral Valve: The mitral valve is grossly normal. No  evidence of mitral valve regurgitation. No evidence of mitral valve stenosis. Tricuspid Valve: The tricuspid valve is grossly normal. Tricuspid valve regurgitation is trivial. No evidence of tricuspid stenosis. Aortic Valve: The aortic valve is tricuspid. Aortic valve regurgitation is not visualized. No aortic stenosis is present. Pulmonic Valve: The pulmonic valve was grossly normal. Pulmonic valve regurgitation is not visualized. No evidence of pulmonic stenosis. Aorta: The aortic root is normal in size and structure. Venous: The inferior vena cava is normal in size with greater than 50% respiratory variability, suggesting right atrial pressure of 3 mmHg. IAS/Shunts: The atrial septum is grossly normal.  LEFT VENTRICLE PLAX 2D                        Biplane EF (MOD) LVIDd:         4.90 cm         LV Biplane EF:   Left LVIDs:         3.60 cm                          ventricular LV PW:         0.90 cm                          ejection LV IVS:        1.10 cm                          fraction by LVOT diam:     1.90 cm                          2D MOD LV SV:         43                               biplane is LV SV Index:   22                               63.2 %. LVOT Area:     2.84 cm  Diastology                                LV e' lateral:   8.93 cm/s LV Volumes (MOD)               LV E/e' lateral: 11.4 LV vol d, MOD    98.1 ml       LV e' medial:    6.08 cm/s A2C:                           LV E/e' medial:  16.8 LV vol d, MOD    88.1 ml A4C: LV vol s, MOD    53.3 ml A2C: LV vol s, MOD    23.4 ml A4C: LV SV MOD A2C:   44.8 ml LV SV MOD A4C:   88.1 ml LV SV MOD BP:    60.6 ml RIGHT VENTRICLE RV S prime:     11.40 cm/s TAPSE (M-mode): 1.9 cm LEFT ATRIUM             Index       RIGHT ATRIUM           Index LA diam:        3.90 cm 2.04 cm/m  RA Area:     13.40 cm LA Vol (A2C):   50.2 ml 26.24 ml/m RA Volume:   34.30 ml  17.93 ml/m LA Vol (A4C):   63.9 ml 33.40 ml/m LA Biplane Vol: 58.4  ml 30.53 ml/m  AORTIC VALVE LVOT Vmax:   88.30 cm/s LVOT Vmean:  53.100 cm/s LVOT VTI:    0.151 m  AORTA Ao Root diam: 3.30 cm MITRAL VALVE                TRICUSPID VALVE MV Area (PHT): 7.16 cm     TR Peak grad:   27.9 mmHg MV Decel Time: 106 msec     TR Vmax:        264.00 cm/s MV E velocity: 102.00 cm/s MV A velocity: 49.20 cm/s   SHUNTS MV E/A ratio:  2.07         Systemic VTI:  0.15 m                             Systemic Diam: 1.90 cm Lennie Odor MD Electronically signed by Lennie Odor MD Signature Date/Time: 02/24/2020/1:09:12 PM    Final    CT HEAD CODE STROKE WO CONTRAST  Result Date: 02/23/2020 CLINICAL DATA:  Code stroke. Neuro deficit, acute, stroke suspected. Mental status change at 11 a.m., last known well 8 a.m. this morning. EXAM: CT HEAD WITHOUT CONTRAST TECHNIQUE: Contiguous axial images were obtained from the base of the skull through the vertex without intravenous contrast. COMPARISON:  Brain MRI 09/17/2018 FINDINGS: Brain: There is a large acute demarcated ischemic infarct within the left MCA vascular territory affecting the left frontal, temporal and parietal lobes as well as left insula and subinsular region. The left caudate and lentiform nuclei are also affected. No evidence of hemorrhagic conversion. There is mild associated mass effect with 3 mm rightward midline shift. Redemonstrated superimposed small chronic infarcts within the left cerebral hemisphere. Background mild generalized parenchymal atrophy and chronic small vessel ischemic disease. No extra-axial fluid collection. No evidence of intracranial mass. Vascular: No hyperdense vessel Skull: Atherosclerotic calcifications. Sinuses/Orbits: Visualized  orbits show no acute finding. Mild ethmoid sinus mucosal thickening. No significant mastoid effusion. ASPECTS [facility] Stroke Program Early CT Score) - Ganglionic level infarction (caudate, lentiform nuclei, internal capsule, insula, M1-M3 cortex): 2 - Supraganglionic infarction  (M4-M6 cortex): 2 Total score (0-10 with 10 being normal): 4 These results were called by telephone at the time of interpretation on 02/23/2020 at 12:20 pm to provider Erick Blinks, who verbally acknowledged these results. IMPRESSION: Large acute demarcated left MCA vascular territory as described. ASPECTS is 4. No evidence of hemorrhagic conversion. Mild mass effect with 3 mm rightward midline shift. Stable background mild generalized parenchymal atrophy and chronic small vessel ischemic disease. Superimposed small chronic infarcts within the left cerebral hemisphere. Electronically Signed   By: Jackey Loge DO   On: 02/23/2020 12:22   CT ANGIO HEAD CODE STROKE  Result Date: 02/23/2020 CLINICAL DATA:  Neuro deficit, acute, stroke suspected. EXAM: CT ANGIOGRAPHY HEAD AND NECK CT PERFUSION BRAIN TECHNIQUE: Multidetector CT imaging of the head and neck was performed using the standard protocol during bolus administration of intravenous contrast. Multiplanar CT image reconstructions and MIPs were obtained to evaluate the vascular anatomy. Carotid stenosis measurements (when applicable) are obtained utilizing NASCET criteria, using the distal internal carotid diameter as the denominator. Multiphase CT imaging of the brain was performed following IV bolus contrast injection. Subsequent parametric perfusion maps were calculated using RAPID software. CONTRAST:  OMNIPAQUE IOHEXOL 350 MG/ML SOLN COMPARISON:  Noncontrast head CT performed earlier the same day. FINDINGS: CTA NECK FINDINGS Aortic arch: The left vertebral artery arises directly from the aortic arch. Atherosclerotic plaque within the visualized aortic arch and proximal major branch vessels of the neck. No hemodynamically significant innominate or proximal subclavian artery stenosis. Right carotid system: The CCA and ICA are patent within the neck. Atherosclerotic plaque within these vessels. Most notably there is at least moderate calcified plaque  within the carotid bifurcation. Exact quantification of stenosis within the proximal right ICA is difficult due to motion artifact and blooming artifact of calcified plaque at this site. Additionally, there is 50-60% stenosis of the mid cervical ICA. Left carotid system: The CCA is patent to the bifurcation. 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 Vertebral arteries: The dominant right vertebral artery is patent throughout the neck. Moderate/severe atherosclerotic narrowing within the V1 right vertebral artery. The non dominant left vertebral artery is developmentally diminutive, but patent throughout the neck Skeleton: Reversal of the expected cervical lordosis. Cervical spondylosis with multilevel disc space narrowing, posterior disc osteophytes and uncovertebral hypertrophy. Other neck: No neck mass or cervical lymphadenopathy. Upper chest: No consolidation within the imaged lung apices. 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 There is minimal reconstitution of flow within the supraclinoid left ICA. The M1 and M2 left middle cerebral arteries are patent, although with asymmetrically decreased opacification as compared to the right side. Additionally, there is a moderate to moderately severe focal stenosis within a superior division proximal M2 left MCA branch (series 8, image 17). The intracranial right internal carotid artery is patent. However, there are sites of severe stenosis within the cavernous and paraclinoid segments. The M1 right middle cerebral artery is patent without significant stenosis. No right M2 proximal branch occlusion is identified. There is a moderate to moderately severe  stenosis within an inferior division mid M2 right MCA branch  vessel (series 9, image 19). The A1 left anterior cerebral artery is hypoplastic. The A1 right anterior cerebral artery is dominant. The A2 and more distal anterior cerebral arteries are patent. However, there are multifocal high-grade stenoses within the bilateral A2 anterior cerebral arteries. Posterior circulation: The non dominant intracranial left vertebral artery is developmentally diminutive, but patent and terminates as the left PICA. The dominant intracranial right vertebral artery is patent, although with sites of moderate stenosis. The basilar artery is patent. The posterior cerebral arteries are patent. However, there is a high-grade focal stenosis within the P2 left PCA and high-grade focal stenosis within the right PCA at the P2/P3 junction. Venous sinuses: Within limitations of contrast timing, no convincing thrombus. Anatomic variants: Posterior communicating arteries are hypoplastic or absent bilaterally. Review of the MIP images confirms the above findings CT Brain Perfusion Findings: ASPECTS: 4 CBF (<30%) Volume: 89mL (left MCA vascular territory) Perfusion (Tmax>6.0s) volume: (left MCA vascular territory) Mismatch Volume: 60mL Infarction Location:Left MCA vascular territory. These results were called by telephone at the time of interpretation on 02/23/2020 at 12:40 pm to provider [facility] , who verbally acknowledged these results. IMPRESSION: CTA neck: 1. 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 2. There is at least moderate calcified plaque at the right carotid bifurcation. There is stenosis at the origin of the cervical right vertebral artery which is poorly quantified due to motion degradation and blooming artifact from calcified plaque at this site. Carotid artery duplex is recommended for further evaluation. Additionally, there is apparent 50-60% stenosis of the mid cervical right ICA. 3. Moderate/severe  atherosclerotic narrowing within the Dom V1 right vertebral artery. 4. The left vertebral artery is developmentally diminutive, but patent throughout the neck. CTA head: 1. The left internal carotid artery remains occluded intracranially throughout the siphon region. There is minimal reconstitution of flow within the supraclinoid left ICA. The M1 and M2 left middle cerebral arteries are patent, although with asymmetrically decreased opacification as compared to the right side. Additionally, there is a moderate to moderately severe focal stenosis within a superior division proximal M2 left MCA branch. 2. Additional intracranial atherosclerotic disease with multifocal stenoses, most notably as follows. 3. High-grade stenoses within the cavernous/paraclinoid right ICA. 4. Moderate to moderately severe focal stenosis within an inferior division mid M2 right MCA branch vessel. 5. Moderate stenosis within the V4 right vertebral artery. 6. High-grade stenosis within the P2 left PCA. 7. High-grade stenosis within the right PCA at the P2/P3 junction. 8. Multifocal high-grade stenoses within the A2 anterior cerebral arteries bilaterally. CT perfusion head: The perfusion software identifies an 89 mL core infarct within the left MCA vascular territory. The perfusion software identifies a 149 mL region of critically hypoperfused parenchyma within the left MCA vascular territory. Reported mismatch volume 60 mL. Electronically Signed   By: Jackey Loge DO   On: 02/23/2020 12:53   CT ANGIO NECK CODE STROKE  Result Date: 02/23/2020 CLINICAL DATA:  Neuro deficit, acute, stroke suspected. EXAM: CT ANGIOGRAPHY HEAD AND NECK CT PERFUSION BRAIN TECHNIQUE: Multidetector CT imaging of the head and neck was performed using the standard protocol during bolus administration of intravenous contrast. Multiplanar CT image reconstructions and MIPs were obtained to evaluate the vascular anatomy. Carotid stenosis measurements (when applicable)  are obtained utilizing NASCET criteria, using the distal internal carotid diameter as the denominator. Multiphase CT imaging  of the brain was performed following IV bolus contrast injection. Subsequent parametric perfusion maps were calculated using RAPID software. CONTRAST:  OMNIPAQUE IOHEXOL 350 MG/ML SOLN COMPARISON:  Noncontrast head CT performed earlier the same day. FINDINGS: CTA NECK FINDINGS Aortic arch: The left vertebral artery arises directly from the aortic arch. Atherosclerotic plaque within the visualized aortic arch and proximal major branch vessels of the neck. No hemodynamically significant innominate or proximal subclavian artery stenosis. Right carotid system: The CCA and ICA are patent within the neck. Atherosclerotic plaque within these vessels. Most notably there is at least moderate calcified plaque within the carotid bifurcation. Exact quantification of stenosis within the proximal right ICA is difficult due to motion artifact and blooming artifact of calcified plaque at this site. Additionally, there is 50-60% stenosis of the mid cervical ICA. Left carotid system: The CCA is patent to the bifurcation. 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 Vertebral arteries: The dominant right vertebral artery is patent throughout the neck. Moderate/severe atherosclerotic narrowing within the V1 right vertebral artery. The non dominant left vertebral artery is developmentally diminutive, but patent throughout the neck Skeleton: Reversal of the expected cervical lordosis. Cervical spondylosis with multilevel disc space narrowing, posterior disc osteophytes and uncovertebral hypertrophy. Other neck: No neck mass or cervical lymphadenopathy. Upper chest: No consolidation within the imaged lung apices. Mild atelectasis or scarring within the posterior aspect of the left upper lobe Review of the MIP images confirms the above findings CTA HEAD FINDINGS  Anterior circulation: The left internal carotid artery remains occluded throughout the siphon region. There is minimal reconstitution of flow within the supraclinoid left ICA. The M1 and M2 left middle cerebral arteries are patent, although with asymmetrically decreased opacification as compared to the right side. Additionally, there is a moderate to moderately severe focal stenosis within a superior division proximal M2 left MCA branch (series 8, image 17). The intracranial right internal carotid artery is patent. However, there are sites of severe stenosis within the cavernous and paraclinoid segments. The M1 right middle cerebral artery is patent without significant stenosis. No right M2 proximal branch occlusion is identified. There is a moderate to moderately severe stenosis within an inferior division mid M2 right MCA branch vessel (series 9, image 19). The A1 left anterior cerebral artery is hypoplastic. The A1 right anterior cerebral artery is dominant. The A2 and more distal anterior cerebral arteries are patent. However, there are multifocal high-grade stenoses within the bilateral A2 anterior cerebral arteries. Posterior circulation: The non dominant intracranial left vertebral artery is developmentally diminutive, but patent and terminates as the left PICA. The dominant intracranial right vertebral artery is patent, although with sites of moderate stenosis. The basilar artery is patent. The posterior cerebral arteries are patent. However, there is a high-grade focal stenosis within the P2 left PCA and high-grade focal stenosis within the right PCA at the P2/P3 junction. Venous sinuses: Within limitations of contrast timing, no convincing thrombus. Anatomic variants: Posterior communicating arteries are hypoplastic or absent bilaterally. Review of the MIP images confirms the above findings CT Brain Perfusion Findings: ASPECTS: 4 CBF (<30%) Volume: 89mL (left MCA vascular territory) Perfusion (Tmax>6.0s)  volume: (left MCA vascular territory) Mismatch Volume: 60mL Infarction Location:Left MCA vascular territory. These results were called by telephone at the time of interpretation on 02/23/2020 at 12:40 pm to provider [facility] , who verbally acknowledged these results. IMPRESSION: CTA neck: 1. The cervical left ICA is occluded from  its origin and remains occluded throughout the remainder of the neck. 2. There is at least moderate calcified plaque at the right carotid bifurcation. There is stenosis at the origin of the cervical right vertebral artery which is poorly quantified due to motion degradation and blooming artifact from calcified plaque at this site. Carotid artery duplex is recommended for further evaluation. Additionally, there is apparent 50-60% stenosis of the mid cervical right ICA. 3. Moderate/severe atherosclerotic narrowing within the Dom V1 right vertebral artery. 4. The left vertebral artery is developmentally diminutive, but patent throughout the neck. CTA head: 1. The left internal carotid artery remains occluded intracranially throughout the siphon region. There is minimal reconstitution of flow within the supraclinoid left ICA. The M1 and M2 left middle cerebral arteries are patent, although with asymmetrically decreased opacification as compared to the right side. Additionally, there is a moderate to moderately severe focal stenosis within a superior division proximal M2 left MCA branch. 2. Additional intracranial atherosclerotic disease with multifocal stenoses, most notably as follows. 3. High-grade stenoses within the cavernous/paraclinoid right ICA. 4. Moderate to moderately severe focal stenosis within an inferior division mid M2 right MCA branch vessel. 5. Moderate stenosis within the V4 right vertebral artery. 6. High-grade stenosis within the P2 left PCA. 7. High-grade stenosis within the right PCA at the P2/P3 junction. 8. Multifocal high-grade stenoses within the A2  anterior cerebral arteries bilaterally. CT perfusion head: The perfusion software identifies an 89 mL core infarct within the left MCA vascular territory. The perfusion software identifies a 149 mL region of critically hypoperfused parenchyma within the left MCA vascular territory. Reported mismatch volume 60 mL. Electronically Signed   By: Jackey Loge DO   On: 02/23/2020 12:53    PHYSICAL EXAM  Temp:  [98.9 F (37.2 C)-100.3 F (37.9 C)] 100 F (37.8 C) (08/12 0800) Pulse Rate:  [64-87] 82 (08/12 1000) Resp:  [23-39] 25 (08/12 1000) BP: (108-182)/(66-126) 177/91 (08/12 1000) SpO2:  [92 %-98 %] 96 % (08/12 1000) FiO2 (%):  [28 %] 28 % (08/12 0757)  General - Well nourished, well developed, in no apparent distress. Gurgly sound in the throat, more prominent today.   Ophthalmologic - fundi not visualized due to noncooperation.  Cardiovascular - regular rate and rhythm today, not in afib.  Neuro - awake alert, still global aphasia with word salad. Only able to close eyes on command, but then perseverated on it. Not able to follow peripheral commands. Not able to name but able to repeat 1-2 words. No more gaze preference, able to track bilaterally. Not cooperative on visual field testing, but seems to have full visual field. Right facial droop. Tongue midline in mouth. LUE 5/5, RUE flaccid. LLE at least 4/5 and RLE 2-/5 proximally. Not moving toes bilaterally. Sensation, coordination not cooperative and gait not tested.   ASSESSMENT/PLAN Mr. Jonathan [name] is a 59 y.o. male with history of hypertension, hyperlipidemia, smoker, stroke in 09/2019 admitted for bilateral acute limb ischemia status post stenting with reocclusion status post thrombectomy and endarterectomy as well as left leg fasciotomy.  Developed aphasia and right hemiplegia.  CT showed left large MCA infarct.  Transferred to [facility] for further evaluation. No tPA given due to on argatroban and established stroke.    Stroke:  left  large MCA infarct due to left ICA occlusion most likely due to chronic left carotid artery stenosis/occlusion in the setting of operation (possible hypotension) vs. PAF  CT head left MCA large infarct  CTA head and  neck left ICA occlusion, right ICA bulb moderate stenosis with mid cervical right ICA 50 to 60% stenosis.  High-grade stenosis right ICA siphon.  Bilateral M2 moderate to severe focal stenosis.  Right V1 and V4 moderate to severe stenosis.  High-grade stenosis bilateral P2 and A2.  Carotid Doppler left ICA occluded at origin, right ICA less than 50% stenosis.  Repeat CT left MCA infarct increased size and surrounding edema  CT 8/11 stable large left MCA infarct with MLS 4-715mm  Repeat CT in am  2D Echo EF 60 to 65%  LDL 71  HgbA1c 6.2  lovenox for VTE prophylaxis   Argatroban prior to admission, now on aspirin 325 mg daily and clopidogrel 75 mg daily. May consider AC 2 weeks post stroke given large size of stroke  Patient counseled to be compliant with his antithrombotic medications  Ongoing aggressive stroke risk factor management  Therapy recommendations: Pending  Disposition: Pending  Cerebral edema  CT showed large left MCA infarct with mild midline shift  On 3% saline -> off now -> 1/2 NS @ 50  Sodium goal 150-155  Na 143->152->153->154->160  Sodium check every 6 hours  CT 8/11 showed slight worsening midline shift 4-355mm  CT repeat in am  PAF  PAF on EKGs  Trop 2259->2260->3089  No AC at this time due to large left MCA infarcts - Continue DAPT for now  May consider AC 2 weeks post stroke  HIT ruled out  Platelet 170-148-126-117->212  Was on argatroban, now off  HIT panel neg  On lovenox for DVT prophylaxis   Bilateral ischemic lower limb  S/p bilateral iliac artery stents  Bilateral stent reocclusion -> bilateral thrombectomy and endarterectomy + as well as left LE fasciotomy  VVS on board  On DAPT  History of  stroke  09/2019 presented with slurred speech and incoherent speech.  MRI showed left MCA scattered infarcts.  No MRI or CTA performed.  However from MRI T2 imaging concerning for left ICA stenosis  Patient otherwise for admission at that time for further stroke work-up however patient refused and signed AMA.  Home medication including aspirin 325, Plavix 75 and Lipitor 80, not sure compliance  Hypertension . Stable at high end . Resumed home lisinopril 20 and amlodipine 10 . CCM also added metoprolol 50 tid -> 75 bid . Gradually lower BP to goal  Long term BP goal 130-150 given left ICA occlusion  Hyperlipidemia  Home meds: Lipitor 80  LDL 71, goal < 70  Now on Lipitor 80  Continue statin at discharge  Transaminitis  AST/ALT 203/46 -> 163/56-> 96/75  Continue to monitor  Okay to continue Lipitor at this time  Acute blood loss anemia  Hemoglobin 12.0-9.3-7.2-7.4-9.4->11.5->10.4->10.1  Close monitoring  Transfuse PRBC if hemoglobin less than 7  Dysphagia Aspiration   Passed swallow  Now on dysphagia 1 diet and thin liquid  Speech on board  Aspiration precaution  Chest PT  3% neb  NT suction PRN  Tobacco abuse  Current smoker  Smoking cessation counseling provided  Nicotine patch provided  Pt is willing to quit  Other Stroke Risk Factors  Multifocal severe vasculopathy  Other Active Problems  Hypokalemia K 3.3->3.9->3.2->2.7->3.4  Fever T-max 100.6-> afebrile  Hospital day # 4  This patient is critically ill due to large left MCA infarct with cerebral edema, b/l illiac A thrombosis s/p stenting, thrombectomy and endarterectomy, aphasia and aspiration, afib RVR, transaminitis, hypokalemia and at significant risk of neurological worsening, death form recurrent  stroke, hemorrhagic conversion, respiratory failure, heart failure, seizure. This patient's care requires constant monitoring of vital signs, hemodynamics, respiratory and cardiac  monitoring, review of multiple databases, neurological assessment, discussion with family, other specialists and medical decision making of high complexity. I spent 30 minutes of neurocritical care time in the care of this patient   Marvel Plan, MD PhD Stroke Neurology 02/27/2020 11:06 AM    To contact Stroke Continuity provider, please refer to [email or web address]. After hours, contact General Neurology

## 2020-02-27 NOTE — Progress Notes (Signed)
Rehab Admissions Coordinator Note:  Patient was screened by Clois Dupes for appropriateness for an Inpatient Acute Rehab Consult per therapy recommendations. .  At this time, we are recommending Inpatient Rehab consult. I will place order per protocol.  Clois Dupes RN MSN 02/27/2020, 6:41 PM  I can be reached at [phone].

## 2020-02-27 NOTE — Progress Notes (Signed)
Pt was able to generate secretions after chest pt through the bed and Hypertonic saline nebulized treatment.  Deep oral suction performed.  RT will continue to encourage coughing exercises for patient to generate a productive cough.

## 2020-02-28 ENCOUNTER — Inpatient Hospital Stay (HOSPITAL_COMMUNITY): Payer: BC Managed Care – PPO

## 2020-02-28 DIAGNOSIS — I10 Essential (primary) hypertension: Secondary | ICD-10-CM | POA: Diagnosis not present

## 2020-02-28 DIAGNOSIS — I63132 Cerebral infarction due to embolism of left carotid artery: Secondary | ICD-10-CM | POA: Diagnosis not present

## 2020-02-28 DIAGNOSIS — D62 Acute posthemorrhagic anemia: Secondary | ICD-10-CM

## 2020-02-28 DIAGNOSIS — E785 Hyperlipidemia, unspecified: Secondary | ICD-10-CM | POA: Diagnosis not present

## 2020-02-28 DIAGNOSIS — R4701 Aphasia: Secondary | ICD-10-CM

## 2020-02-28 DIAGNOSIS — D72829 Elevated white blood cell count, unspecified: Secondary | ICD-10-CM

## 2020-02-28 DIAGNOSIS — Z720 Tobacco use: Secondary | ICD-10-CM

## 2020-02-28 LAB — BASIC METABOLIC PANEL
Anion gap: 10 (ref 5–15)
BUN: 21 mg/dL — ABNORMAL HIGH (ref 6–20)
CO2: 27 mmol/L (ref 22–32)
Calcium: 8.9 mg/dL (ref 8.9–10.3)
Chloride: 113 mmol/L — ABNORMAL HIGH (ref 98–111)
Creatinine, Ser: 0.69 mg/dL (ref 0.61–1.24)
GFR calc Af Amer: >60 mL/min (ref >60)
GFR calc non Af Amer: >60 mL/min (ref >60)
Glucose, Bld: 134 mg/dL — ABNORMAL HIGH (ref 70–99)
Potassium: 3.5 mmol/L (ref 3.5–5.1)
Sodium: 150 mmol/L — ABNORMAL HIGH (ref 135–145)

## 2020-02-28 LAB — CBC
HCT: 36.1 % — ABNORMAL LOW (ref 39.0–52.0)
Hemoglobin: 11 g/dL — ABNORMAL LOW (ref 13.0–17.0)
MCH: 30.6 pg (ref 26.0–34.0)
MCHC: 30.5 g/dL (ref 30.0–36.0)
MCV: 100.6 fL — ABNORMAL HIGH (ref 80.0–100.0)
Platelets: 229 10*3/uL (ref 150–400)
RBC: 3.59 MIL/uL — ABNORMAL LOW (ref 4.22–5.81)
RDW: 14.1 % (ref 11.5–15.5)
WBC: 12.1 10*3/uL — ABNORMAL HIGH (ref 4.0–10.5)
nRBC: 0.2 % (ref 0.0–0.2)

## 2020-02-28 LAB — GLUCOSE, CAPILLARY
Glucose-Capillary: 124 mg/dL — ABNORMAL HIGH (ref 70–99)
Glucose-Capillary: 196 mg/dL — ABNORMAL HIGH (ref 70–99)

## 2020-02-28 LAB — SODIUM: Sodium: 151 mmol/L — ABNORMAL HIGH (ref 135–145)

## 2020-02-28 MED ORDER — LISINOPRIL 20 MG PO TABS
40.0000 mg | ORAL_TABLET | Freq: Every day | ORAL | Status: DC
  Administered 2020-02-28 – 2020-03-10 (×12): 40 mg via ORAL
  Filled 2020-02-28 (×12): qty 2

## 2020-02-28 MED ORDER — GUAIFENESIN ER 600 MG PO TB12
600.0000 mg | ORAL_TABLET | Freq: Two times a day (BID) | ORAL | Status: DC
  Administered 2020-02-28 – 2020-03-10 (×22): 600 mg via ORAL
  Filled 2020-02-28 (×24): qty 1

## 2020-02-28 NOTE — Progress Notes (Signed)
Patient taken by transport to Barium Swallow test. Ok to be off Tele while off the floor per CCM MD. Pt. Will be transported to new room 3W27.

## 2020-02-28 NOTE — Consult Note (Signed)
Physical Medicine and Rehabilitation Consult Reason for Consult: Right side weakness and aphasia Referring Physician: [facility]ad   HPI: Jonathan [name] is a 59 y.o. right-handed male with history of hypertension, hyperlipidemia, history of CVA with mild aphasia that resolved and maintained on aspirin and Plavix, peripheral vascular disease.  History taken from chart review due to aphasia.  Patient lives with spouse. 1 level home 2 steps to entry. Reportedly independent prior to admission. Patient presented to [facility] on [facility]/2020 with critical limb ischemia secondary to occlusion of the iliac artery and underwent stenting return back to the OR 02/22/2020 found to have acute thrombosis of bilateral iliac stents underwent thrombectomy and enterectomy. He also underwent fasciotomies of the left leg. There was concern for heparin-induced thrombocytopenia.  His platelets did not drop significantly he was started on argatroban and a HIT panel was sent. He developed right hemiparesis and aphasia CT of the head, personally reviewed, showing large acute left MCA infarct. No evidence of hemorrhagic conversion. Mild mass-effect with 3 mm rightward midline shift. Patient was transferred to [facility] for neuro ICU monitoring. Patient did not receive TPA. CT angiogram of the head and neck neck showed cervical left ICA occluded from its origin and remains occluded throughout the remainder of the neck. High-grade stenosis within the cavernous paraclinoid right ICA.  Echocardiogram with ejection fraction of 65%.  No regional wall motion abnormalities. Maintained on aspirin 325 mg and Plavix 75 mg daily for CVA prophylaxis. Subcutaneous Lovenox for DVT prophylaxis. Due to some cerebral edema he remained on hypertonic saline for a short time. HIT panel was ruled out.  Hospital course further complicated by PAF on EKGs with elevated troponin 2260-3089 no AC at this time due to large left MCA  infarct continue DAPT for now consider anticoagulation 2 weeks post stroke. Follow-up vascular surgery after recent thrombectomy and left leg fasciotomy wound VAC was in place awaiting plan for removal.  Dysphagia #1 thin liquid diet. Therapy evaluations completed none recommendations of physical medicine rehab consult.  Review of Systems  Unable to perform ROS: Language   Past Medical History:  Diagnosis Date  . Aphasia S/P CVA   . DDD (degenerative disc disease), cervical   . ED (erectile dysfunction)   . Hyperlipidemia   . Hypertension   . Peripheral arterial disease ([name])   . Stroke [facility])    Past Surgical History:  Procedure Laterality Date  . FEMORAL-FEMORAL BYPASS GRAFT Bilateral 02/22/2020   Procedure: bilateral ileofemoral thrombectomy, left popliteal exposure and thrombectomy, four compartment fasciotomy, left femoral endarterectomy with patch angioplasty;  Surgeon: Serafina Mitchell, MD;  Location: [facility] ORS;  Service: Vascular;  Laterality: Bilateral;  . LOWER EXTREMITY ANGIOGRAPHY Left 02/21/2020   Procedure: LOWER EXTREMITY ANGIOGRAPHY;  Surgeon: Katha Cabal, MD;  Location: [place] CV LAB;  Service: Cardiovascular;  Laterality: Left;  . PERIPHERAL VASCULAR CATHETERIZATION N/A 07/21/2015   Procedure: Abdominal Aortogram w/Lower Extremity;  Surgeon: Katha Cabal, MD;  Location: [place] CV LAB;  Service: Cardiovascular;  Laterality: N/A;  . PERIPHERAL VASCULAR CATHETERIZATION  07/21/2015   Procedure: Lower Extremity Intervention;  Surgeon: Katha Cabal, MD;  Location: [place] CV LAB;  Service: Cardiovascular;;   Family History  Problem Relation Age of Onset  . Emphysema Mother   . Heart attack Father   . Heart attack Paternal Grandmother    Social History:  reports that he has been smoking cigarettes. He has never used smokeless  tobacco. He reports current alcohol use. He reports that he does not use drugs. Allergies: No Known Allergies Medications  Prior to Admission  Medication Sig Dispense Refill  . atenolol (TENORMIN) 50 MG tablet Take 1 tablet (50 mg total) by mouth daily. 30 tablet 3  . atorvastatin (LIPITOR) 40 MG tablet Take 40 mg by mouth every evening.    [name] lisinopril (ZESTRIL) 10 MG tablet Take 5 mg by mouth 2 (two) times daily.    [name] acetaminophen (TYLENOL) 650 MG suppository Place 1 suppository (650 mg total) rectally every 6 (six) hours as needed for mild pain (or Fever >/= 101). 12 suppository 0  . albuterol (PROVENTIL) (2.5 MG/3ML) 0.083% nebulizer solution Inhale 3 mLs into the lungs every 6 (six) hours as needed for wheezing or shortness of breath. (Patient not taking: Reported on 02/23/2020) 75 mL 12  . alum & mag hydroxide-simeth (MAALOX/MYLANTA) 200-200-20 MG/5ML suspension Take 15-30 mLs by mouth every 2 (two) hours as needed for indigestion. 355 mL 0  . amLODipine-benazepril (LOTREL) 10-20 MG capsule Take 1 capsule by mouth daily.  (Patient not taking: Reported on 02/23/2020)    . [EXPIRED] aspirin 81 MG chewable tablet Chew 4 tablets (324 mg total) by mouth once for 1 dose. (Patient not taking: Reported on 02/23/2020) 4 tablet 0  . aspirin EC 325 MG tablet Take 1 tablet (325 mg total) by mouth daily. (Patient not taking: Reported on 02/23/2020) 30 tablet 0  . atorvastatin (LIPITOR) 80 MG tablet Take 1 tablet (80 mg total) by mouth daily. 120 tablet 1  . benazepril (LOTENSIN) 20 MG tablet Take 1 tablet (20 mg total) by mouth daily. 30 tablet 1  . chlorhexidine gluconate, MEDLINE KIT, (PERIDEX) 0.12 % solution 15 mLs by Mouth Rinse route 2 (two) times daily. 120 mL 0  . clopidogrel (PLAVIX) 75 MG tablet Take 1 tablet (75 mg total) by mouth daily. 1 tablet 30  . docusate (COLACE) 50 MG/5ML liquid Take 10 mLs (100 mg total) by mouth 2 (two) times daily. 100 mL 0  . docusate sodium (COLACE) 100 MG capsule Take 1 capsule (100 mg total) by mouth daily. 10 capsule 0  . guaiFENesin-dextromethorphan (ROBITUSSIN DM) 100-10 MG/5ML syrup Take  15 mLs by mouth every 4 (four) hours as needed for cough. 118 mL 0  . insulin aspart (NOVOLOG) 100 UNIT/ML injection Inject 0-9 Units into the skin every 4 (four) hours. 10 mL 11  . lisinopril (ZESTRIL) 20 MG tablet Take 20 mg by mouth daily.  (Patient not taking: Reported on 02/23/2020)    . nicotine (NICODERM CQ - DOSED IN MG/24 HOURS) 21 mg/24hr patch Place 1 patch (21 mg total) onto the skin daily. 28 patch 0  . ondansetron (ZOFRAN) 4 MG/2ML SOLN injection Inject 2 mLs (4 mg total) into the vein every 6 (six) hours as needed for nausea or vomiting. 2 mL 0  . ondansetron (ZOFRAN) 4 MG/2ML SOLN injection Inject 2 mLs (4 mg total) into the vein every 6 (six) hours as needed for nausea or vomiting. 2 mL 0  . polyethylene glycol (MIRALAX / GLYCOLAX) 17 g packet Take 17 g by mouth daily. 14 each 0    Home: Home Living Family/patient expects to be discharged to:: Private residence Living Arrangements: Spouse/significant other, Other (Comment) Available Help at Discharge: Family, Available PRN/intermittently Type of Home: House Home Access: Stairs to enter [employer] of Steps: 2 steps at side entrance from car port with no railing, 3 Stairs in back entrance  to deck with one railing, 5 Steps to front entrance with bilateral railing. Home Layout: One level Bathroom Shower/Tub: Tub/shower unit Home Equipment: Cane - single point Additional Comments: spouse via phone states that her sister ordered pt SPC, but that he doesn't use. No other equipment. (info gleaned from chart)  Lives With: Spouse  Functional History: Prior Function Level of Independence: Independent Comments: Pt's spouse via phone indicates that he was completely Indep, working nights at [employer]" standing/walking/active work. Has called out x1wk d/t L LE pain.(info gleaned from chart review) Functional Status:  Mobility: Bed Mobility Overal bed mobility: Needs Assistance Bed Mobility: Supine to Sit, Sit to  Supine Supine to sit: Mod assist, +2 for physical assistance, +2 for safety/equipment Sit to supine: Max assist, +2 for physical assistance, +2 for safety/equipment General bed mobility comments: Pt required assist to initiate Rt LE movement, assist to lift trunk, and assist to scoot to EOB  Transfers Overall transfer level: Needs assistance Equipment used: 2 person hand held assist Transfers: Sit to/from Stand Sit to Stand: Max assist, +2 physical assistance, +2 safety/equipment General transfer comment: Pt initially required mod A +2 for sit to stand, but attempted x 2 more times, and pt required max A +2 and demonstrated difficulty maintaining full hip and knee extension - required facilitation, and fatigued quickly returning to sitting abruptly       ADL: ADL Overall ADL's : Needs assistance/impaired Eating/Feeding: Minimal assistance, Bed level Eating/Feeding Details (indicate cue type and reason): pt with frequent spillage and decreased follow through of precautions  Grooming: Wash/dry hands, Wash/dry face, Oral care, Sitting, Moderate assistance Upper Body Bathing: Moderate assistance, Sitting Lower Body Bathing: Maximal assistance, Sit to/from stand Upper Body Dressing : Maximal assistance, Sitting Lower Body Dressing: Total assistance, Sit to/from stand Toilet Transfer: Total assistance Toilet Transfer Details (indicate cue type and reason): unable  Toileting- Clothing Manipulation and Hygiene: Total assistance, Sit to/from stand, +2 for physical assistance Functional mobility during ADLs: Maximal assistance, +2 for physical assistance, +2 for safety/equipment  Cognition: Cognition Overall Cognitive Status: Difficult to assess Arousal/Alertness: Awake/alert Orientation Level: Oriented to person Attention: Focused Focused Attention: Appears intact Cognition Arousal/Alertness: Awake/alert Behavior During Therapy: Impulsive, WFL for tasks assessed/performed Overall  Cognitive Status: Difficult to assess Area of Impairment: Attention, Following commands, Problem solving Current Attention Level: Selective Following Commands: Follows one step commands consistently (with multi modal cues ) Problem Solving: Difficulty sequencing, Requires verbal cues General Comments: Pt will follow one step commands consistently with cues.  He is a bit impulsive.   Difficult to assess due to: Impaired communication  Blood pressure (!) 144/79, pulse 62, temperature 97.9 F (36.6 C), temperature source Oral, resp. rate (!) 22, SpO2 98 %. Physical Exam Vitals reviewed.  Constitutional:      General: He is not in acute distress.    Appearance: Normal appearance.  HENT:     Head: Normocephalic and atraumatic.     Right Ear: External ear normal.     Left Ear: External ear normal.     Nose: Nose normal.  Eyes:     General:        Right eye: No discharge.        Left eye: No discharge.     Extraocular Movements: Extraocular movements intact.  Cardiovascular:     Rate and Rhythm: Normal rate and regular rhythm.  Pulmonary:     Effort: Pulmonary effort is normal. No respiratory distress.     Breath sounds: Normal breath  sounds. No stridor.  Abdominal:     General: Abdomen is flat. Bowel sounds are normal. There is no distension.  Musculoskeletal:     Cervical back: Normal range of motion and neck supple.     Comments: Bilateral lower extremity edema and tenderness  Skin:    Comments: +VAC Incision with dressing CDI  Neurological:     Mental Status: He is alert.     Comments: Makes eye contact with examiner.  Expressive > receptive aphasia  Motor: Inconsistently following one-step commands.  RUE/RLE: 0/5 proximal distal LUE/LLE:?  4+/5 proximal distal Dysarthria Right facial weakness  Psychiatric:     Comments: Unable to assess due to aphasia     Results for orders placed or performed during the hospital encounter of 02/23/20 (from the past 24 hour(s))   Sodium     Status: Abnormal   Collection Time: 02/27/20  7:34 AM  Result Value Ref Range   Sodium 160 (H) 135 - 145 mmol/L  Troponin I (High Sensitivity)     Status: Abnormal   Collection Time: 02/27/20  7:34 AM  Result Value Ref Range   Troponin I (High Sensitivity) 2,227 (HH) <18 ng/L  Sodium     Status: Abnormal   Collection Time: 02/27/20  1:11 PM  Result Value Ref Range   Sodium 156 (H) 135 - 145 mmol/L  Sodium     Status: Abnormal   Collection Time: 02/27/20  8:20 PM  Result Value Ref Range   Sodium 154 (H) 135 - 145 mmol/L   No results found.  Assessment/Plan: Diagnosis: Large left MCA infarct. Stroke: Continue secondary stroke prophylaxis and Risk Factor Modification listed below:   Antiplatelet therapy:   Blood Pressure Management:  Continue current medication with prn's with permisive HTN per primary team Statin Agent:   Tobacco abuse: Counsel Right sided hemiparesis: fit for orthosis to prevent contractures (resting hand splint for day, wrist cock up splint at night, PRAFO, etc) PT/OT for mobility, ADL training  Labs and images (see above) independently reviewed.  Records reviewed and summated above.  1. Does the need for close, 24 hr/day medical supervision in concert with the patient's rehab needs make it unreasonable for this patient to be served in a less intensive setting? Yes  2. Co-Morbidities requiring supervision/potential complications: HTN (monitor and provide prns in accordance with increased physical exertion and pain), hyperlipidemia, history of CVA with mild aphasia that resolved, severe peripheral vascular disease, leukocytosis (repeat labs, cont to monitor for signs and symptoms of infection, further workup if indicated), ABLA (repeat labs, consider transfusion if necessary to ensure appropriate perfusion for increased activity tolerance) 3. Due to bladder management, bowel management, safety, skin/wound care, disease management, medication  administration, pain management and patient education, does the patient require 24 hr/day rehab nursing? Yes 4. Does the patient require coordinated care of a physician, rehab nurse, therapy disciplines of PT/OT/SLP to address physical and functional deficits in the context of the above medical diagnosis(es)? Yes Addressing deficits in the following areas: balance, endurance, locomotion, strength, transferring, bowel/bladder control, bathing, toileting, cognition, speech, language, swallowing and psychosocial support 5. Can the patient actively participate in an intensive therapy program of at least 3 hrs of therapy per day at least 5 days per week? Potentially 6. The potential for patient to make measurable gains while on inpatient rehab is excellent 7. Anticipated functional outcomes upon discharge from inpatient rehab are min assist  with PT, min assist with OT, min assist with SLP. 8. Estimated  rehab length of stay to reach the above functional goals is: 27-30 days. 9. Anticipated discharge destination: Home 10. Overall Rehab/Functional Prognosis: good  RECOMMENDATIONS: This patient's condition is appropriate for continued rehabilitative care in the following setting: CIR if adequate caregiver support available upon discharge. Patient has agreed to participate in recommended program. Potentially Note that insurance prior authorization may be required for reimbursement for recommended care.  Comment: Rehab Admissions Coordinator to follow up.  I have personally performed a face to face diagnostic evaluation, including, but not limited to relevant history and physical exam findings, of this patient and developed relevant assessment and plan.  Additionally, I have reviewed and concur with the physician assistant's documentation above.   Delice Lesch, MD, ABPMR Lavon Paganini Angiulli, PA-C 02/28/2020

## 2020-02-28 NOTE — Progress Notes (Signed)
Inpatient Rehabilitation Admissions Coordinator  I will follow up on Monday with patient's progress to assist with planning dispo.  Ottie Glazier, RN, MSN Rehab Admissions Coordinator [phone] 02/28/2020 2:15 PM

## 2020-02-28 NOTE — Progress Notes (Signed)
Occupational Therapy Treatment Patient Details Name: Jonathan [name] MRN: [id number] DOB: 1961-06-09 Today's Date: 02/28/2020    History of present illness Pt is 59 y/o M with PMH: DDD, HTN, HLD, h/o CVA with some acute aphasia (wife reports his speech was normal before this admission), and PAD s/p stent in 2017. Presented with L LE pain. Now s/p angio with PCI and stent placement on 8/6 and revascularization of L LE on 8/7. Extubated 02/23/20. Of note, during OT assessment, significant R sided weakness and significant aphasia present. OT notified RN immediately who called code stroke.   OT comments  Pt provided a scrotal sling during session that patient can sleep in even. The sling has a hammock to help support scrotum and help with elevation while supine. Pt reports increased comfort with transfer. OT to monitor scrotal sling due to wound vac on R thigh and foley L placement from penis to ensure no pressure present. R shoulder with 1 finger subluxation noted this session. Pt total +2 max (A) to pivot to chair so recommend RN staff hoyer lift oob to chair daily.    Follow Up Recommendations  CIR    Equipment Recommendations  None recommended by OT    Recommendations for Other Services Rehab consult    Precautions / Restrictions Precautions Precautions: Fall Precaution Comments: provided scrotal sling Restrictions Weight Bearing Restrictions: No       Mobility Bed Mobility Overal bed mobility: Needs Assistance Bed Mobility: Supine to Sit;Rolling Rolling: Mod assist   Supine to sit: Mod assist        Transfers Overall transfer level: Needs assistance Equipment used: 2 person hand held assist Transfers: Sit to/from Stand;Stand Pivot Transfers Sit to Stand: Max assist Stand pivot transfers: Max assist;+2 physical assistance       General transfer comment: pt with impaired ability to pivot durign SPT, requiring significant assistance to prevent fall due to L lateral  lean. Pt requires R knee block durign transfer attempts due to flaccid RLE    Balance Overall balance assessment: Needs assistance Sitting-balance support: Single extremity supported;Feet supported Sitting balance-Leahy Scale: Poor Sitting balance - Comments: modA for static sitting Postural control: Posterior lean;Right lateral lean Standing balance support: Bilateral upper extremity supported Standing balance-Leahy Scale: Zero Standing balance comment: maxA-maxA x2                           ADL either performed or assessed with clinical judgement   ADL Overall ADL's : Needs assistance/impaired Eating/Feeding: Set up;Sitting Eating/Feeding Details (indicate cue type and reason): eating extremely fast with no control even with cues. pt drank tea in one tilt of the cup. OT was unable to stop the pt to help with speed of intact indicated in SLP notes Grooming: Wash/dry hands;Moderate assistance                                 General ADL Comments: transfered EOB to chair      Vision       Perception     Praxis      Cognition Arousal/Alertness: Awake/alert Behavior During Therapy: WFL for tasks assessed/performed Overall Cognitive Status: Difficult to assess Area of Impairment: Attention;Memory;Following commands;Safety/judgement;Awareness;Problem solving                   Current Attention Level: Selective Memory: Decreased recall of precautions Following Commands: Follows one step commands  consistently Safety/Judgement: Decreased awareness of safety;Decreased awareness of deficits Awareness: Intellectual Problem Solving: Difficulty sequencing;Requires verbal cues;Requires tactile cues          Exercises     Shoulder Instructions       General Comments VSS    Pertinent Vitals/ Pain       Pain Assessment: Faces Faces Pain Scale: Hurts whole lot Pain Location: scrotum Pain Descriptors / Indicators: Grimacing Pain  Intervention(s): Monitored during session  Home Living                                          Prior Functioning/Environment              Frequency  Min 2X/week        Progress Toward Goals  OT Goals(current goals can now be found in the care plan section)  Progress towards OT goals: Progressing toward goals  Acute Rehab OT Goals Patient Stated Goal: to eat OT Goal Formulation: With patient Time For Goal Achievement: 03/12/20 Potential to Achieve Goals: Good ADL Goals Pt Will Perform Eating: with set-up;with supervision;sitting Pt Will Perform Grooming: with set-up;with supervision;sitting Pt Will Perform Upper Body Bathing: with min assist;sitting Pt Will Perform Lower Body Bathing: with mod assist;sit to/from stand Pt Will Transfer to Toilet: with mod assist;stand pivot transfer;bedside commode Pt Will Perform Toileting - Clothing Manipulation and hygiene: with mod assist;sit to/from stand Additional ADL Goal #1: Pt will locate ADL items on his Rt with no more than min cues Additional ADL Goal #2: Pt will contribute to R UE PROM with L UE as able/tolerable to maintain joint integrity.  Plan Discharge plan remains appropriate    Co-evaluation    PT/OT/SLP Co-Evaluation/Treatment: Yes Reason for Co-Treatment: For patient/therapist safety;To address functional/ADL transfers PT goals addressed during session: Mobility/safety with mobility;Balance;Strengthening/ROM OT goals addressed during session: ADL's and self-care;Proper use of Adaptive equipment and DME;Strengthening/ROM      AM-PAC OT "6 Clicks" Daily Activity     Outcome Measure   Help from another person eating meals?: A Little Help from another person taking care of personal grooming?: A Lot Help from another person toileting, which includes using toliet, bedpan, or urinal?: Total Help from another person bathing (including washing, rinsing, drying)?: A Lot Help from another person to  put on and taking off regular upper body clothing?: A Lot Help from another person to put on and taking off regular lower body clothing?: Total 6 Click Score: 11    End of Session    OT Visit Diagnosis: Unsteadiness on feet (R26.81);Cognitive communication deficit (R41.841);Hemiplegia and hemiparesis Symptoms and signs involving cognitive functions: Cerebral infarction Hemiplegia - Right/Left: Right Hemiplegia - dominant/non-dominant: Dominant Hemiplegia - caused by: Cerebral infarction   Activity Tolerance Patient tolerated treatment well   Patient Left in chair;with call bell/phone within reach;with chair alarm set   Nurse Communication Mobility status;Precautions        Time: 4818-5631 OT Time Calculation (min): 32 min  Charges: OT General Charges $OT Visit: 1 Visit OT Treatments $Self Care/Home Management : 8-22 mins   Brynn, OTR/L  Acute Rehabilitation Services Pager: [phone] Office: [phone] .    Mateo Flow 02/28/2020, 4:27 PM

## 2020-02-28 NOTE — Progress Notes (Addendum)
.   NAMEALEXANDRIA [name], MRN:  [id number], DOB:  Jul 13, 1961, LOS: 5 ADMISSION DATE:02/23/2020, CONSULTATION DATE:02/23/2020 REFERRING MD:Dr. Myra Gianotti, CHIEF COMPLAINT:L MCA stroke, vasculopathy  Brief History   Jonathan [name] is a 59 year old male with past medical history of hypertension, hyperlipidemia, tobacco abuse, significant PAD, who was admitted to [facility] for acute ischemia of the left lower extremity status post percutaneous transluminal angioplasty and stent placement bilateral common iliac arteries and external iliac arteries on 02/21/2020.  On 02/22/2020, he was found to have acute thrombosis of bilateral iliac stents which he underwentbilateral thrombectomy, left popliteal exposure and thrombectomy, left femoral endarterectomy and patch angioplasty as well as left fasciotomy.Argatroban was started for concerning of heparin-induced thrombocytopenia.  On 02/23/2020, patient developed slurred speech, right-sided weakness. CT head shows left MCA stroke. Patient was transferred to [facility] for further care  Past Medical History  Hypertension Tobacco abuse Hyperlipidemia PAD  Significant Hospital Events   8/6-8/7--critical limb ischemia requiring surgical intervention 8/8--stroke in the face of anticoagulation with heparin, subsequently argatroban when rethrombosed  Consults:  Neurology  Vascular surgery  Procedures:  02/21/2020 angioplasty and stents in iliac arteries 02/22/2020 thrombectomy and endarterectomy and fasciotomy  Significant Diagnostic Tests:  8/8 CT head:Large acute demarcated left MCA vascular territory as described.ASPECTS is 4. No evidence of hemorrhagic conversion. Mild mass effect with 3 mm rightward midline shift. 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 There is minimal reconstitution of flow within the supraclinoid left ICA. The M1 and M2 left middle cerebral arteries are patent, although with asymmetrically decreased opacification as compared to the right side. Additionally, there is a moderate to moderately severe focal stenosis within a superior division proximal M2 left MCA branch.The perfusion software identifies an 89 mL core infarct within the left MCA vascular territory. The perfusion software identifies a region of critically hypoperfused parenchyma within the left MCA vascular territory. Reported mismatch volume 60 mL 8/11 CT head: Associated regional mass effect with up to 5 mm of left-to-right shift, slightly worsened from previous. No hydrocephalus or ventricular trapping. No evidence for hemorrhagic transformation. 8/13 CT head: Midline shift stable slightly improved at 4.5 mm  Micro Data:  8/6>> COVID neg 8/8>>MRSAnegative  Antimicrobials:  None  Interim history/subjective:  No acute event overnight  Patient is eating breakfast in bed during examination.  He appears pleasant and in no acute distress.  Patient is not on oxygen.  Explained to patient about inpatient rehab to regain some of his function before go home.  Patient gives signs of understanding.  Objective   Blood pressure (!) 163/84, pulse 70, temperature 97.9 F (36.6 C), temperature source Oral, resp. rate (!) 23, SpO2 91 %.    FiO2 (%):  [28 %] 28 %   Intake/Output Summary (Last 24 hours) at 02/28/2020 0740 Last data filed at 02/28/2020 0600 Gross per 24 hour  Intake 869.12 ml  Output 1550 ml  Net -680.88 ml   There were no vitals filed for this visit.  Physical Exam Constitutional:      General: He is not in acute distress.    Appearance: He is not toxic-appearing.     Comments: Pleasant  HENT:     Head: Normocephalic.  Eyes:     General: No scleral icterus.       Right eye: No discharge.  Left eye: No discharge.      Conjunctiva/sclera: Conjunctivae normal.  Cardiovascular:     Rate and Rhythm: Normal rate and regular rhythm.     Heart sounds: No murmur heard.   Pulmonary:     Effort: No respiratory distress.     Comments: No wheezing noted Still mild coarse breath sounds likely secondary to secretion Abdominal:     Palpations: Abdomen is soft.     Tenderness: There is no abdominal tenderness.  Musculoskeletal:     Cervical back: Normal range of motion.     Right lower leg: No edema.     Left lower leg: No edema.  Skin:    General: Skin is warm.     Coloration: Skin is not jaundiced.  Neurological:     Mental Status: He is alert.     Comments: Normal strength and movement in the left upper extremity and lower extremity. Unable to move right upper and lower extremities  Psychiatric:        Mood and Affect: Mood normal.     Resolved Hospital Problem list     Assessment & Plan:  LeftMCA stroke Cerebral edema Acute lower leg ischemiastatus post angioplasty and stent placement HIT ruled out  Aspiration pneumonitis A. fib with RVR Elevated Troponin Blood loss anemia-stable  Plan:  Left MCA stroke Cerebral edema CT head showed large acute left MCA infarct with no acute hemorrhage, 3 mm rightward midline shift. CTA shows occluded left ICA,moderately severe focal stenosis of proximal M2 left MCA branch, and high-grade stenosis of PCA.Neurology is on board.  Continue hypertonic saline per neurology.  CT head 8/13 shows slightly improved midline shift at 4.5 mm with no significant hemorrhage.   -Appreciate neurology recommendation -Frequent neurochecks -Continue aspirin 325 mg p.o. and Plavix 75 mg p.o. -Continue hypertonic saline IV 75 cc/h for cerebral edema with frequent sodium check per neurology -Continue amlodipine 10 mg, increase lisinopril to 40 mg for hypertension.  Goal blood pressure 130-150   Acute lower leg ischemia status post angioplasty and stent  placement Patient underwent thrombectomy and endarterectomy with fasciotomy. There is a concern for heparin-induced thrombocytopeniaeven though the platelet drops is not significant. HIT panel was negative so Lovenox was started for DVT prophylaxis.  Vascular surgery is following -Appreciate vascular surgery recommendations -Lovenox for DVT prophylaxis   A. fib with RVR This is a new diagnosis for him. His echo shows a normal EF of 60-65% with normal right and left ventricular function. Telemetry shows normal sinus rhythm.  Currently rate controlled. -Continue metoprolol 75 mg twice daily  -DC diltiazem drip -He is not a candidate for anticoagulation at this time due to risk of hemorrhagic conversion.  Will consider anticoagulation 2 weeks post stroke per neurology -CIWA with Ativan --Continue folate and thiamine   Elevated troponin Troponin elevated at 2259 - 2260 - 2227. Last Echo shows normal EF with no abnormal wall motion. EKG did not show any evidence of ischemia. This maybe secondary to his Afib.  Repeat EKG shows normal sinus rhythm with no obvious sign of ischemia.  Likely demand ischemia secondary to A. fib or muscle injury. --Continue to monitor troponin   Aspiration pneumonitis Tachypnea Continue pured food.  Encourage patient to chew and swallow slowly.  No more aspirating events in the last 24 hours. --Continue to monitor for sign of aspiration or respiratory distress -Continue working with respiratory therapist and breathing treatment as needed --No antibiotics indicated at this time   Blood loss anemia-stable -  CBC daily -Transfuse if less than 7   Best practice:  Diet:puree Pain/Anxiety/Delirium protocol (if indicated):CIWA w Ativan VAP protocol (if indicated):n/a DVT prophylaxis:Lovenox GI prophylaxis:protonix Glucose control:N/A Mobility:bed Code Status:full Family Communication:Update family Disposition:ICU for hypertonic  saline  Critical care time: 20 min    Jonathan Stabler, DO Internal Medicine Residency My pager: [phone]

## 2020-02-28 NOTE — Progress Notes (Signed)
Physical Therapy Treatment Patient Details Name: Jonathan [name] MRN: [id number] DOB: 07-01-1961 Today's Date: 02/28/2020    History of Present Illness Pt is 59 y/o M with PMH: DDD, HTN, HLD, h/o CVA with some acute aphasia (wife reports his speech was normal before this admission), and PAD s/p stent in 2017. Presented with L LE pain. Now s/p angio with PCI and stent placement on 8/6 and revascularization of L LE on 8/7. Extubated 02/23/20. Of note, during OT assessment, significant R sided weakness and significant aphasia present. OT notified RN immediately who called code stroke.    PT Comments    Pt tolerates treatment well, able to transfer out of bed this session with significant assistance. Pt remains flaccid on R side, appears frustrated at times when instructed to attempt to use R arm for exercise or activities to promote neuromuscular education. Pt continues to demonstrate significant balance impairment and requires R knee block to prevent bucking during all standing activity. Pt also with R inattention and with difficulty tracking completely to R side. Pt will benefit from continued acute PT POC to improve mobility quality and reduce falls risk. PT continues to recommend CIR at this time.   Follow Up Recommendations  CIR     Equipment Recommendations  Wheelchair (measurements PT);Wheelchair cushion (measurements PT);Hospital bed (mechanical lift if home today)    Recommendations for Other Services       Precautions / Restrictions Precautions Precautions: Fall Restrictions Weight Bearing Restrictions: No    Mobility  Bed Mobility Overal bed mobility: Needs Assistance Bed Mobility: Supine to Sit;Rolling Rolling: Mod assist   Supine to sit: Mod assist        Transfers Overall transfer level: Needs assistance Equipment used: 2 person hand held assist Transfers: Sit to/from Stand;Stand Pivot Transfers Sit to Stand: Max assist Stand pivot transfers: Max assist;+2  physical assistance       General transfer comment: pt with impaired ability to pivot durign SPT, requiring significant assistance to prevent fall due to L lateral lean. Pt requires R knee block durign transfer attempts due to flaccid RLE  Ambulation/Gait                 Stairs             Wheelchair Mobility    Modified Rankin (Stroke Patients Only) Modified Rankin (Stroke Patients Only) Pre-Morbid Rankin Score: No symptoms Modified Rankin: Severe disability     Balance Overall balance assessment: Needs assistance Sitting-balance support: Single extremity supported;Feet supported Sitting balance-Leahy Scale: Poor Sitting balance - Comments: modA for static sitting Postural control: Posterior lean;Right lateral lean Standing balance support: Bilateral upper extremity supported Standing balance-Leahy Scale: Zero Standing balance comment: maxA-maxA x2                            Cognition Arousal/Alertness: Awake/alert Behavior During Therapy: WFL for tasks assessed/performed Overall Cognitive Status: Difficult to assess Area of Impairment: Attention;Memory;Following commands;Safety/judgement;Awareness;Problem solving                   Current Attention Level: Selective Memory: Decreased recall of precautions Following Commands: Follows one step commands consistently Safety/Judgement: Decreased awareness of safety;Decreased awareness of deficits Awareness: Intellectual Problem Solving: Difficulty sequencing;Requires verbal cues;Requires tactile cues        Exercises      General Comments General comments (skin integrity, edema, etc.): VSS      Pertinent Vitals/Pain Pain Assessment: Faces Faces Pain  Scale: Hurts whole lot Pain Location: scrotum Pain Descriptors / Indicators: Grimacing Pain Intervention(s): Monitored during session    Home Living                      Prior Function            PT Goals (current  goals can now be found in the care plan section) Acute Rehab PT Goals Patient Stated Goal: to eat and reduce pain in scrotum Progress towards PT goals: Progressing toward goals    Frequency    Min 4X/week      PT Plan Current plan remains appropriate    Co-evaluation PT/OT/SLP Co-Evaluation/Treatment: Yes Reason for Co-Treatment: Complexity of the patient's impairments (multi-system involvement);Necessary to address cognition/behavior during functional activity;For patient/therapist safety;To address functional/ADL transfers PT goals addressed during session: Mobility/safety with mobility;Balance;Strengthening/ROM        AM-PAC PT "6 Clicks" Mobility   Outcome Measure  Help needed turning from your back to your side while in a flat bed without using bedrails?: A Lot Help needed moving from lying on your back to sitting on the side of a flat bed without using bedrails?: A Lot Help needed moving to and from a bed to a chair (including a wheelchair)?: Total Help needed standing up from a chair using your arms (e.g., wheelchair or bedside chair)?: Total Help needed to walk in hospital room?: Total Help needed climbing 3-5 steps with a railing? : Total 6 Click Score: 8    End of Session Equipment Utilized During Treatment: Gait belt Activity Tolerance: Patient tolerated treatment well Patient left: in chair;with call bell/phone within reach;with chair alarm set Nurse Communication: Mobility status;Need for lift equipment PT Visit Diagnosis: Unsteadiness on feet (R26.81);Other abnormalities of gait and mobility (R26.89);Muscle weakness (generalized) (M62.81);Difficulty in walking, not elsewhere classified (R26.2);Other symptoms and signs involving the nervous system (R29.898);Pain Pain - part of body:  (scrotum)     Time: 6283-6629 PT Time Calculation (min) (ACUTE ONLY): 31 min  Charges:  $Therapeutic Activity: 8-22 mins                     Arlyss Gandy, PT, DPT Acute  Rehabilitation Pager: [phone]    Arlyss Gandy 02/28/2020, 1:39 PM

## 2020-02-28 NOTE — Progress Notes (Signed)
STROKE TEAM PROGRESS NOTE   SUBJECTIVE (INTERVAL HISTORY) No family is at bedside. Patient lying in bed, awake alert, still has aphasia. However, continue to have gurgling sound, on chest PT, 3% nebulization and NT suctioning as needed. Barium study repeated today and speech treatment dysphagia 1 and sling quit. Sodium 150, DC half saline, allow sodium gradually trending down.  OBJECTIVE Temp:  [97.9 F (36.6 C)-100.3 F (37.9 C)] 100.3 F (37.9 C) (08/13 1625) Pulse Rate:  [62-79] 71 (08/13 1625) Cardiac Rhythm: Normal sinus rhythm (08/12 2000) Resp:  [18-28] 20 (08/13 1625) BP: (142-169)/(69-87) 142/75 (08/13 1625) SpO2:  [90 %-98 %] 94 % (08/13 1625)  Recent Labs  Lab 02/23/20 0727 02/23/20 1128 02/23/20 1553 02/28/20 0731 02/28/20 1136  GLUCAP 130* 138* 146* 124* 196*   Recent Labs  Lab 02/23/20 1741 02/23/20 2113 02/24/20 0747 02/24/20 1341 02/25/20 0424 02/25/20 0630 02/26/20 0825 02/26/20 1520 02/27/20 0149 02/27/20 0149 02/27/20 0734 02/27/20 1311 02/27/20 2020 02/28/20 0801 02/28/20 1332  NA 139   < > 145   < > 152*   < > 154*  156*   < > 160*   < > 160* 156* 154* 151* 150*  K 3.3*   < > 3.9  --  3.2*  --  2.7*  --  3.4*  --   --   --   --   --  3.5  CL 105   < > 112*  --  116*  --  118*  --  124*  --   --   --   --   --  113*  CO2 24   < > 23  --  22  --  25  --  27  --   --   --   --   --  27  GLUCOSE 159*   < > 135*  --  155*  --  253*  --  150*  --   --   --   --   --  134*  BUN 11   < > 8  --  10  --  15  --  19  --   --   --   --   --  21*  CREATININE 0.68   < > 0.66  --  0.72  --  0.75  --  0.76  --   --   --   --   --  0.69  CALCIUM 7.9*   < > 8.0*  --  8.7*   < > 8.7*  --  8.8*  --   --   --   --   --  8.9  MG 1.9  --   --   --  2.2  --   --   --   --   --   --   --   --   --   --   PHOS 1.9*  --   --   --   --   --   --   --   --   --   --   --   --   --   --    < > = values in this interval not displayed.   Recent Labs  Lab 02/22/20 0210  02/23/20 1741 02/25/20 0424 02/27/20 0149  AST 25 203* 163* 96*  ALT 32 46* 56* 75*  ALKPHOS 37* 34* 42 37*  BILITOT 0.9 0.6 1.1 0.8  PROT 5.5* 4.6* 6.4*  5.6*  ALBUMIN 3.2* 2.3* 2.8* 2.5*   Recent Labs  Lab 02/24/20 1025 02/25/20 0424 02/26/20 0825 02/27/20 0149 02/28/20 0437  WBC 10.0 11.9* 9.0 9.5 12.1*  HGB 9.4* 11.5* 10.4* 10.1* 11.0*  HCT 28.3* 34.8* 32.9* 32.1* 36.1*  MCV 94.0 94.8 97.6 99.1 100.6*  PLT 117* 160 187 212 229   No results for input(s): CKTOTAL, CKMB, CKMBINDEX, TROPONINI in the last 168 hours. No results for input(s): LABPROT, INR in the last 72 hours. No results for input(s): COLORURINE, LABSPEC, PHURINE, GLUCOSEU, HGBUR, BILIRUBINUR, KETONESUR, PROTEINUR, UROBILINOGEN, NITRITE, LEUKOCYTESUR in the last 72 hours.  Invalid input(s): APPERANCEUR     Component Value Date/Time   CHOL 132 02/24/2020 0748   TRIG 129 02/24/2020 0748   HDL 35 (L) 02/24/2020 0748   CHOLHDL 3.8 02/24/2020 0748   VLDL 26 02/24/2020 0748   LDLCALC 71 02/24/2020 0748   Lab Results  Component Value Date   HGBA1C 6.2 (H) 02/23/2020   No results found for: LABOPIA, COCAINSCRNUR, LABBENZ, AMPHETMU, THCU, LABBARB  No results for input(s): ETH in the last 168 hours.  I have personally reviewed the radiological images below and agree with the radiology interpretations.  CT HEAD WO CONTRAST  Result Date: 02/28/2020 CLINICAL DATA:  Stroke, follow-up. EXAM: CT HEAD WITHOUT CONTRAST TECHNIQUE: Contiguous axial images were obtained from the base of the skull through the vertex without intravenous contrast. COMPARISON:  CT head without contrast 02/26/2020, 02/24/2020, and 02/23/2019. FINDINGS: Brain: Large left MCA territory nonhemorrhagic infarct is again noted. Infarct involves the left lentiform nucleus and caudate head. Mass effect is again noted. Midline shift is stable to slightly improved at 4.5 mm. Partial effacement of left lateral ventricle is again noted. Sulcal effacement is  stable. No significant extra-axial hemorrhage is present. No significant right-sided infarcts are present. Cerebellar atrophy is stable. No acute infarcts are present in the posterior fossa. Vascular: Atherosclerotic changes are present within the cavernous internal carotid arteries bilaterally. No hyperdense vessel is present. Skull: Calvarium is intact. No focal lytic or blastic lesions are present. No significant extracranial soft tissue lesion is present. Sinuses/Orbits: The paranasal sinuses and mastoid air cells are clear. Remote right orbital blowout fracture is stable. Globes and orbits are otherwise within normal limits. IMPRESSION: 1. Stable appearance of large left MCA territory nonhemorrhagic infarct. 2. Stable to slightly improved midline shift. 3. No significant extra-axial hemorrhage. 4. Stable atrophy and white matter disease. Electronically Signed   By: Marin Roberts M.D.   On: 02/28/2020 06:33   CT HEAD WO CONTRAST  Result Date: 02/26/2020 CLINICAL DATA:  Follow-up examination for acute stroke. EXAM: CT HEAD WITHOUT CONTRAST TECHNIQUE: Contiguous axial images were obtained from the base of the skull through the vertex without intravenous contrast. COMPARISON:  Prior CT from 02/24/2020. FINDINGS: Brain: Continued interval evolution of large left MCA territory infarct is seen, overall stable in size and distribution from previous. Associated regional mass effect with partial effacement of the left lateral ventricle and up to 5 mm of left-to-right shift, slightly worsened from previous. No hydrocephalus or ventricular trapping. Basilar cisterns remain patent. No evidence for hemorrhagic transformation. No other new acute large vessel territory infarct. No intracranial hemorrhage. No extra-axial fluid collection. Vascular: No hyperdense vessel. Scattered vascular calcifications noted within the carotid siphons. Skull: Scalp soft tissues within normal limits. Calvarium unchanged.  Sinuses/Orbits: Globes and orbital soft tissues demonstrate no acute finding. Paranasal sinuses and mastoid air cells remain largely clear. Other: None. IMPRESSION: 1. Continued interval evolution  of large left MCA territory infarct, overall stable in size and distribution from previous. Associated regional mass effect with up to 5 mm of left-to-right shift, slightly worsened from previous. No hydrocephalus or ventricular trapping. No evidence for hemorrhagic transformation. 2. No other new acute intracranial abnormality. Electronically Signed   By: Rise Mu M.D.   On: 02/26/2020 02:07   CT HEAD WO CONTRAST  Result Date: 02/24/2020 CLINICAL DATA:  Stroke follow-up EXAM: CT HEAD WITHOUT CONTRAST TECHNIQUE: Contiguous axial images were obtained from the base of the skull through the vertex without intravenous contrast. COMPARISON:  02/23/2020 head CT. FINDINGS: Brain: Sequela of evolving left MCA territory infarct. 3-4 mm rightward midline shift is unchanged. No intracranial hemorrhage. No mass lesion. No ventriculomegaly or extra-axial fluid collection. Vascular: No hyperdense vessel. Bilateral skull base atherosclerotic calcifications. Skull: Negative for fracture or focal lesion. Sinuses/Orbits: Normal orbits. Soft tissue impacted left maxillary molar, unchanged. Mild right sphenoid sinus mucosal thickening. No mastoid effusion. Other: None. IMPRESSION: Evolving left MCA territory infarct with unchanged 3-4 mm rightward midline shift. No intracranial hemorrhage. Electronically Signed   By: Stana Bunting M.D.   On: 02/24/2020 09:29   CT HEAD WO CONTRAST  Result Date: 02/23/2020 CLINICAL DATA:  Stroke follow-up EXAM: CT HEAD WITHOUT CONTRAST TECHNIQUE: Contiguous axial images were obtained from the base of the skull through the vertex without intravenous contrast. COMPARISON:  Head CT 02/21/2020 FINDINGS: Brain: Large area of cytotoxic edema throughout the left MCA territory. No acute  hemorrhage. 3 mm rightward midline shift. Vascular: Atherosclerotic calcification of the internal carotid arteries at the skull base. Skull: Normal. Negative for fracture or focal lesion. Sinuses/Orbits: No acute finding. Other: None. IMPRESSION: 1. Slightly increased cytotoxic edema within the left MCA territory. 2. No acute hemorrhage. 3. 3 mm rightward midline shift. Electronically Signed   By: Deatra Robinson M.D.   On: 02/23/2020 22:00   US Carotid Bilateral  Result Date: 02/23/2020 CLINICAL DATA:  Slurred speech.  Carotid stenosis. EXAM: BILATERAL CAROTID DUPLEX ULTRASOUND TECHNIQUE: Wallace Cullens scale imaging, color Doppler and duplex ultrasound were performed of bilateral carotid and vertebral arteries in the neck. COMPARISON:  CTA neck 02/23/2020 FINDINGS: Criteria: Quantification of carotid stenosis is based on velocity parameters that correlate the residual internal carotid diameter with NASCET-based stenosis levels, using the diameter of the distal internal carotid lumen as the denominator for stenosis measurement. The following velocity measurements were obtained: RIGHT ICA: 117/63 cm/sec CCA: 130/38 cm/sec SYSTOLIC ICA/CCA RATIO:  0.9 ECA: 220 cm/sec LEFT ICA: Occluded CCA: 75/10 cm/sec ECA: 195 cm/sec RIGHT CAROTID ARTERY: Mild atherosclerotic disease in the right common carotid artery. Heterogeneous plaque at the right carotid bulb. External carotid artery is patent with normal waveform. Atherosclerotic plaque in the proximal internal carotid artery. Normal waveforms and velocities in the internal carotid artery. RIGHT VERTEBRAL ARTERY: Antegrade flow and normal waveform in the right vertebral artery. LEFT CAROTID ARTERY: Left common carotid artery is patent with intimal thickening. Left carotid artery is patent. External carotid artery is patent with normal waveform. 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 No flow in the mid or distal left internal carotid artery. LEFT VERTEBRAL ARTERY:  Antegrade flow and normal waveform in the left vertebral artery. IMPRESSION: 1. 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 Findings correspond with recent CTA findings. 2. Atherosclerotic plaque involving the carotid arteries. Estimated degree of stenosis in the right internal carotid artery is less than 50%. 3. Patent vertebral arteries with antegrade flow. Electronically  Signed   By: Richarda Overlie M.D.   On: 02/23/2020 14:42   PERIPHERAL VASCULAR CATHETERIZATION  Result Date: 02/21/2020 See op note  CT CEREBRAL PERFUSION W CONTRAST  Result Date: 02/23/2020 CLINICAL DATA:  Neuro deficit, acute, stroke suspected. EXAM: CT ANGIOGRAPHY HEAD AND NECK CT PERFUSION BRAIN TECHNIQUE: Multidetector CT imaging of the head and neck was performed using the standard protocol during bolus administration of intravenous contrast. Multiplanar CT image reconstructions and MIPs were obtained to evaluate the vascular anatomy. Carotid stenosis measurements (when applicable) are obtained utilizing NASCET criteria, using the distal internal carotid diameter as the denominator. Multiphase CT imaging of the brain was performed following IV bolus contrast injection. Subsequent parametric perfusion maps were calculated using RAPID software. CONTRAST:  OMNIPAQUE IOHEXOL 350 MG/ML SOLN COMPARISON:  Noncontrast head CT performed earlier the same day. FINDINGS: CTA NECK FINDINGS Aortic arch: The left vertebral artery arises directly from the aortic arch. Atherosclerotic plaque within the visualized aortic arch and proximal major branch vessels of the neck. No hemodynamically significant innominate or proximal subclavian artery stenosis. Right carotid system: The CCA and ICA are patent within the neck. Atherosclerotic plaque within these vessels. Most notably there is at least moderate calcified plaque within the carotid bifurcation. Exact quantification of stenosis within the proximal right ICA is difficult due to motion  artifact and blooming artifact of calcified plaque at this site. Additionally, there is 50-60% stenosis of the mid cervical ICA. Left carotid system: The CCA is patent to the bifurcation. 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 Vertebral arteries: The dominant right vertebral artery is patent throughout the neck. Moderate/severe atherosclerotic narrowing within the V1 right vertebral artery. The non dominant left vertebral artery is developmentally diminutive, but patent throughout the neck Skeleton: Reversal of the expected cervical lordosis. Cervical spondylosis with multilevel disc space narrowing, posterior disc osteophytes and uncovertebral hypertrophy. Other neck: No neck mass or cervical lymphadenopathy. Upper chest: No consolidation within the imaged lung apices. 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 There is minimal reconstitution of flow within the supraclinoid left ICA. The M1 and M2 left middle cerebral arteries are patent, although with asymmetrically decreased opacification as compared to the right side. Additionally, there is a moderate to moderately severe focal stenosis within a superior division proximal M2 left MCA branch (series 8, image 17). The intracranial right internal carotid artery is patent. However, there are sites of severe stenosis within the cavernous and paraclinoid segments. The M1 right middle cerebral artery is patent without significant stenosis. No right M2 proximal branch occlusion is identified. There is a moderate to moderately severe stenosis within an inferior division mid M2 right MCA branch vessel (series 9, image 19). The A1 left anterior cerebral artery is hypoplastic. The A1 right anterior cerebral artery is  dominant. The A2 and more distal anterior cerebral arteries are patent. However, there are multifocal high-grade stenoses within the bilateral A2 anterior cerebral arteries. Posterior circulation: The non dominant intracranial left vertebral artery is developmentally diminutive, but patent and terminates as the left PICA. The dominant intracranial right vertebral artery is patent, although with sites of moderate stenosis. The basilar artery is patent. The posterior cerebral arteries are patent. However, there is a high-grade focal stenosis within the P2 left PCA  and high-grade focal stenosis within the right PCA at the P2/P3 junction. Venous sinuses: Within limitations of contrast timing, no convincing thrombus. Anatomic variants: Posterior communicating arteries are hypoplastic or absent bilaterally. Review of the MIP images confirms the above findings CT Brain Perfusion Findings: ASPECTS: 4 CBF (<30%) Volume: 60mL (left MCA vascular territory) Perfusion (Tmax>6.0s) volume: (left MCA vascular territory) Mismatch Volume: 37mL Infarction Location:Left MCA vascular territory. These results were called by telephone at the time of interpretation on 02/23/2020 at 12:40 pm to provider [facility] , who verbally acknowledged these results. IMPRESSION: CTA neck: 1. 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 2. There is at least moderate calcified plaque at the right carotid bifurcation. There is stenosis at the origin of the cervical right vertebral artery which is poorly quantified due to motion degradation and blooming artifact from calcified plaque at this site. Carotid artery duplex is recommended for further evaluation. Additionally, there is apparent 50-60% stenosis of the mid cervical right ICA. 3. Moderate/severe atherosclerotic narrowing within the Dom V1 right vertebral artery. 4. The left vertebral artery is developmentally diminutive,  but patent throughout the neck. CTA head: 1. The left internal carotid artery remains occluded intracranially throughout the siphon region. There is minimal reconstitution of flow within the supraclinoid left ICA. The M1 and M2 left middle cerebral arteries are patent, although with asymmetrically decreased opacification as compared to the right side. Additionally, there is a moderate to moderately severe focal stenosis within a superior division proximal M2 left MCA branch. 2. Additional intracranial atherosclerotic disease with multifocal stenoses, most notably as follows. 3. High-grade stenoses within the cavernous/paraclinoid right ICA. 4. Moderate to moderately severe focal stenosis within an inferior division mid M2 right MCA branch vessel. 5. Moderate stenosis within the V4 right vertebral artery. 6. High-grade stenosis within the P2 left PCA. 7. High-grade stenosis within the right PCA at the P2/P3 junction. 8. Multifocal high-grade stenoses within the A2 anterior cerebral arteries bilaterally. CT perfusion head: The perfusion software identifies an 89 mL core infarct within the left MCA vascular territory. The perfusion software identifies a 149 mL region of critically hypoperfused parenchyma within the left MCA vascular territory. Reported mismatch volume 60 mL. Electronically Signed   By: Jackey Loge DO   On: 02/23/2020 12:53   DG CHEST PORT 1 VIEW  Result Date: 02/24/2020 CLINICAL DATA:  59 year old male with shortness of breath. EXAM: PORTABLE CHEST 1 VIEW COMPARISON:  Chest radiograph dated 02/22/2020. FINDINGS: Status post removal of the endotracheal and enteric tube. There is diffuse interstitial densities which may be chronic or represent edema or atypical pneumonia. No large pleural effusion. No pneumothorax. Stable cardiac silhouette. No acute osseous pathology. IMPRESSION: Interval removal of the endotracheal and enteric tubes. Electronically Signed   By: Elgie Collard M.D.   On:  02/24/2020 19:11   Portable Chest x-ray  Result Date: 02/22/2020 CLINICAL DATA:  Left cerebral artery and polyp stroke, postop bilateral iliofemoral thrombectomy, ETT EXAM: PORTABLE CHEST 1 VIEW COMPARISON:  01/12/2013 FINDINGS: Lungs are essentially clear.  No pleural effusion or pneumothorax. The heart is normal in size. Endotracheal tube terminates 5 cm above the carina. Enteric tube terminates in the proximal gastric body. IMPRESSION: Endotracheal tube terminates 5 cm above the carina. Enteric tube terminates in the proximal gastric body. Electronically Signed   By: Charline Bills M.D.   On: 02/22/2020 08:54   DG Swallowing Func-Speech Pathology  Result Date:  02/28/2020 Objective Swallowing Evaluation: Type of Study: MBS-Modified Barium Swallow Study  Patient Details Name: Jonathan [name] MRN: [id number] Date of Birth: 1960-12-17 Today's Date: 02/28/2020 Time: SLP Start Time (ACUTE ONLY): 1457 -SLP Stop Time (ACUTE ONLY): 1513 SLP Time Calculation (min) (ACUTE ONLY): 16 min Past Medical History: Past Medical History: Diagnosis Date . Aphasia S/P CVA  . DDD (degenerative disc disease), cervical  . ED (erectile dysfunction)  . Hyperlipidemia  . Hypertension  . Peripheral arterial disease (HCC)  . Stroke [facility])  Past Surgical History: Past Surgical History: Procedure Laterality Date . FEMORAL-FEMORAL BYPASS GRAFT Bilateral 02/22/2020  Procedure: bilateral ileofemoral thrombectomy, left popliteal exposure and thrombectomy, four compartment fasciotomy, left femoral endarterectomy with patch angioplasty;  Surgeon: Nada Libman, MD;  Location: [facility] ORS;  Service: Vascular;  Laterality: Bilateral; . LOWER EXTREMITY ANGIOGRAPHY Left 02/21/2020  Procedure: LOWER EXTREMITY ANGIOGRAPHY;  Surgeon: Renford Dills, MD;  Location: [facility] INVASIVE CV LAB;  Service: Cardiovascular;  Laterality: Left; . PERIPHERAL VASCULAR CATHETERIZATION N/A 07/21/2015  Procedure: Abdominal Aortogram w/Lower Extremity;  Surgeon: Renford Dills, MD;  Location: [facility] INVASIVE CV LAB;  Service: Cardiovascular;  Laterality: N/A; . PERIPHERAL VASCULAR CATHETERIZATION  07/21/2015  Procedure: Lower Extremity Intervention;  Surgeon: Renford Dills, MD;  Location: [facility] INVASIVE CV LAB;  Service: Cardiovascular;; HPI: 59 y.o. male with a history of hypertension, hyperlipidemia, CVA, severe PAD  who presented with acute critical limb ischemia secondary to occlusion of the iliac artery who underwent stenting on 8/6 at [facility].  He was taken back to the OR on 8/7 found to have acute thrombosis of bilateral iliac stents underwent thrombectomy and endarterectomy;  fasciotomies of the left leg. Extubated 8/8. Developed right hemiplegia and aphasia morning of 8/8. CTH: large territory infarct left MCA. Pt transferred to [facility] same day.   Subjective: alert, aphasic Assessment / Plan / Recommendation CHL IP CLINICAL IMPRESSIONS 02/28/2020 Clinical Impression Pt's oropharyngeal swallow is stable compared to MBS completed on 8/9 even with pt self-feeding throughout this study (see that note for more specifics regarding physiology). Pt was allowed to self-feed at his own rate, drinking cups of thin barium without stopping. Across multiple small cupfuls, there was no aspiration. Trace penetration occurred x2 with thin liquids but cleared spontaneously as he continued to swallow additional boluses. Would continue Dys 1 (puree) diet and thin liquids with full supervision for safety.  SLP Visit Diagnosis Dysphagia, oropharyngeal phase (R13.12) Attention and concentration deficit following -- Frontal lobe and executive function deficit following -- Impact on safety and function Mild aspiration risk   CHL IP TREATMENT RECOMMENDATION 02/28/2020 Treatment Recommendations Therapy as outlined in treatment plan below   Prognosis 02/28/2020 Prognosis for Safe Diet Advancement Good Barriers to Reach Goals Language deficits Barriers/Prognosis Comment -- CHL IP DIET RECOMMENDATION  02/28/2020 SLP Diet Recommendations Dysphagia 1 (Puree) solids;Thin liquid Liquid Administration via Cup;Straw Medication Administration Whole meds with puree Compensations Minimize environmental distractions;Slow rate;Small sips/bites Postural Changes Seated upright at 90 degrees;Remain semi-upright after after feeds/meals (Comment)   CHL IP OTHER RECOMMENDATIONS 02/28/2020 Recommended Consults -- Oral Care Recommendations Oral care BID Other Recommendations Have oral suction available   CHL IP FOLLOW UP RECOMMENDATIONS 02/28/2020 Follow up Recommendations Inpatient Rehab   CHL IP FREQUENCY AND DURATION 02/28/2020 Speech Therapy Frequency (ACUTE ONLY) min 2x/week Treatment Duration 2 weeks      CHL IP ORAL PHASE 02/28/2020 Oral Phase Impaired Oral - Pudding Teaspoon -- Oral - Pudding Cup -- Oral - Honey Teaspoon -- Oral -  Honey Cup -- Oral - Nectar Teaspoon -- Oral - Nectar Cup -- Oral - Nectar Straw NT Oral - Thin Teaspoon -- Oral - Thin Cup Right anterior bolus loss;Weak lingual manipulation;Reduced posterior propulsion;Holding of bolus;Right pocketing in lateral sulci;Pocketing in anterior sulcus;Lingual/palatal residue;Piecemeal swallowing;Decreased bolus cohesion Oral - Thin Straw Right anterior bolus loss;Weak lingual manipulation;Reduced posterior propulsion;Holding of bolus;Right pocketing in lateral sulci;Pocketing in anterior sulcus;Lingual/palatal residue;Piecemeal swallowing;Decreased bolus cohesion Oral - Puree Right anterior bolus loss;Weak lingual manipulation;Reduced posterior propulsion;Holding of bolus;Right pocketing in lateral sulci;Pocketing in anterior sulcus;Lingual/palatal residue;Piecemeal swallowing;Decreased bolus cohesion Oral - Mech Soft -- Oral - Regular NT Oral - Multi-Consistency -- Oral - Pill -- Oral Phase - Comment --  CHL IP PHARYNGEAL PHASE 02/28/2020 Pharyngeal Phase Impaired Pharyngeal- Pudding Teaspoon -- Pharyngeal -- Pharyngeal- Pudding Cup -- Pharyngeal -- Pharyngeal- Honey  Teaspoon -- Pharyngeal -- Pharyngeal- Honey Cup -- Pharyngeal -- Pharyngeal- Nectar Teaspoon -- Pharyngeal -- Pharyngeal- Nectar Cup -- Pharyngeal -- Pharyngeal- Nectar Straw NT Pharyngeal -- Pharyngeal- Thin Teaspoon -- Pharyngeal -- Pharyngeal- Thin Cup Delayed swallow initiation-pyriform sinuses;Delayed swallow initiation-vallecula Pharyngeal -- Pharyngeal- Thin Straw Delayed swallow initiation-pyriform sinuses;Delayed swallow initiation-vallecula Pharyngeal -- Pharyngeal- Puree Delayed swallow initiation-pyriform sinuses;Delayed swallow initiation-vallecula Pharyngeal -- Pharyngeal- Mechanical Soft -- Pharyngeal -- Pharyngeal- Regular NT Pharyngeal -- Pharyngeal- Multi-consistency -- Pharyngeal -- Pharyngeal- Pill -- Pharyngeal -- Pharyngeal Comment --  CHL IP CERVICAL ESOPHAGEAL PHASE 02/28/2020 Cervical Esophageal Phase WFL Pudding Teaspoon -- Pudding Cup -- Honey Teaspoon -- Honey Cup -- Nectar Teaspoon -- Nectar Cup -- Nectar Straw -- Thin Teaspoon -- Thin Cup -- Thin Straw -- Puree -- Mechanical Soft -- Regular -- Multi-consistency -- Pill -- Cervical Esophageal Comment -- Mahala Menghini., M.A. CCC-SLP Acute Rehabilitation Services Pager [phone] Office [phone] 02/28/2020, 4:22 PM              DG Swallowing Func-Speech Pathology  Result Date: 02/24/2020 Objective Swallowing Evaluation: Type of Study: MBS-Modified Barium Swallow Study  Patient Details Name: Jonathan [name] MRN: [id number] Date of Birth: 07-Feb-1961 Today's Date: 02/24/2020 Time: SLP Start Time (ACUTE ONLY): 1310 -SLP Stop Time (ACUTE ONLY): 1340 SLP Time Calculation (min) (ACUTE ONLY): 30 min Past Medical History: Past Medical History: Diagnosis Date . Aphasia S/P CVA  . DDD (degenerative disc disease), cervical  . ED (erectile dysfunction)  . Hyperlipidemia  . Hypertension  . Peripheral arterial disease (HCC)  . Stroke [facility])  Past Surgical History: Past Surgical History: Procedure Laterality Date . FEMORAL-FEMORAL BYPASS GRAFT Bilateral  02/22/2020  Procedure: bilateral ileofemoral thrombectomy, left popliteal exposure and thrombectomy, four compartment fasciotomy, left femoral endarterectomy with patch angioplasty;  Surgeon: Nada Libman, MD;  Location: [facility] ORS;  Service: Vascular;  Laterality: Bilateral; . LOWER EXTREMITY ANGIOGRAPHY Left 02/21/2020  Procedure: LOWER EXTREMITY ANGIOGRAPHY;  Surgeon: Renford Dills, MD;  Location: [facility] INVASIVE CV LAB;  Service: Cardiovascular;  Laterality: Left; . PERIPHERAL VASCULAR CATHETERIZATION N/A 07/21/2015  Procedure: Abdominal Aortogram w/Lower Extremity;  Surgeon: Renford Dills, MD;  Location: [facility] INVASIVE CV LAB;  Service: Cardiovascular;  Laterality: N/A; . PERIPHERAL VASCULAR CATHETERIZATION  07/21/2015  Procedure: Lower Extremity Intervention;  Surgeon: Renford Dills, MD;  Location: [facility] INVASIVE CV LAB;  Service: Cardiovascular;; HPI: 59 y.o. male with a history of hypertension, hyperlipidemia, CVA, severe PAD  who presented with acute critical limb ischemia secondary to occlusion of the iliac artery who underwent stenting on 8/6 at [facility].  He was taken back to the OR on 8/7 found to have acute thrombosis of bilateral  iliac stents underwent thrombectomy and endarterectomy;  fasciotomies of the left leg. Extubated 8/8. Developed right hemiplegia and aphasia morning of 8/8. CTH: large territory infarct left MCA. Pt transferred to [facility] same day.   Subjective: alert, aphasic Assessment / Plan / Recommendation CHL IP CLINICAL IMPRESSIONS 02/24/2020 Clinical Impression Pt presents with a primary oral dysphagia c/b impaired bolus cohesion and control over release into the pharynx.  There was decreased sensation on the right, with barium residue in right lateral and anterior sulci and limited awareness of its presence.  Pharyngeal function was largely intact, with adequate pharyngeal squeeze, no residue post-swallow, and effective laryngeal vestibule closure.  Occasionally, thin and nectar thick  barium reached the pyriforms for a delayed period of time before the swallow was triggered.  On other occasions, the swallow trigger was timely.  There was one incident of penetration to the vocal folds when a bolus of nectar thick liquid prematurely escaped the oral cavity and entered the laryngeal vestibule without eliciting a response.  Pt's impaired sensation appears to be the primary concern and puts him at greater risk of aspiration.  For now, recommend starting a dysphagia 1 diet, thin liquids from cup only - NO STRAWS; give meds whole in applesauce/pudding.  Due to pt's impulsivity, difficulty following commands, and poor sensation/awareness on right side of mouth/throat, he will require careful supervision during meals.  SLP will follow for safety/diet progression and therapeutic training.   SLP Visit Diagnosis Dysphagia, oropharyngeal phase (R13.12) Attention and concentration deficit following -- Frontal lobe and executive function deficit following -- Impact on safety and function Mild aspiration risk   CHL IP TREATMENT RECOMMENDATION 02/24/2020 Treatment Recommendations Therapy as outlined in treatment plan below   Prognosis 02/24/2020 Prognosis for Safe Diet Advancement Good Barriers to Reach Goals -- Barriers/Prognosis Comment -- CHL IP DIET RECOMMENDATION 02/24/2020 SLP Diet Recommendations Dysphagia 1 (Puree) solids;Thin liquid Liquid Administration via Cup;No straw Medication Administration Whole meds with puree Compensations Minimize environmental distractions;Slow rate;Small sips/bites Postural Changes --   CHL IP OTHER RECOMMENDATIONS 02/24/2020 Recommended Consults -- Oral Care Recommendations Oral care BID Other Recommendations --   CHL IP FOLLOW UP RECOMMENDATIONS 02/24/2020 Follow up Recommendations Inpatient Rehab   CHL IP FREQUENCY AND DURATION 02/24/2020 Speech Therapy Frequency (ACUTE ONLY) min 3x week Treatment Duration 2 weeks      CHL IP ORAL PHASE 02/24/2020 Oral Phase Impaired Oral - Pudding  Teaspoon -- Oral - Pudding Cup -- Oral - Honey Teaspoon -- Oral - Honey Cup -- Oral - Nectar Teaspoon -- Oral - Nectar Cup -- Oral - Nectar Straw Right anterior bolus loss;Weak lingual manipulation;Reduced posterior propulsion;Holding of bolus;Right pocketing in lateral sulci;Pocketing in anterior sulcus;Lingual/palatal residue;Piecemeal swallowing;Decreased bolus cohesion Oral - Thin Teaspoon -- Oral - Thin Cup Right anterior bolus loss;Weak lingual manipulation;Reduced posterior propulsion;Holding of bolus;Right pocketing in lateral sulci;Pocketing in anterior sulcus;Lingual/palatal residue;Piecemeal swallowing;Decreased bolus cohesion Oral - Thin Straw Right anterior bolus loss;Weak lingual manipulation;Reduced posterior propulsion;Holding of bolus;Right pocketing in lateral sulci;Pocketing in anterior sulcus;Lingual/palatal residue;Piecemeal swallowing;Decreased bolus cohesion Oral - Puree Right anterior bolus loss;Weak lingual manipulation;Reduced posterior propulsion;Holding of bolus;Right pocketing in lateral sulci;Pocketing in anterior sulcus;Lingual/palatal residue;Piecemeal swallowing;Decreased bolus cohesion Oral - Mech Soft -- Oral - Regular Right anterior bolus loss;Weak lingual manipulation;Reduced posterior propulsion;Holding of bolus;Right pocketing in lateral sulci;Pocketing in anterior sulcus;Lingual/palatal residue;Piecemeal swallowing;Decreased bolus cohesion Oral - Multi-Consistency -- Oral - Pill -- Oral Phase - Comment --  CHL IP PHARYNGEAL PHASE 02/24/2020 Pharyngeal Phase Impaired Pharyngeal- Pudding Teaspoon -- Pharyngeal --  Pharyngeal- Pudding Cup -- Pharyngeal -- Pharyngeal- Honey Teaspoon -- Pharyngeal -- Pharyngeal- Honey Cup -- Pharyngeal -- Pharyngeal- Nectar Teaspoon -- Pharyngeal -- Pharyngeal- Nectar Cup -- Pharyngeal -- Pharyngeal- Nectar Straw Delayed swallow initiation-pyriform sinuses;Delayed swallow initiation-vallecula;Penetration/Aspiration before swallow Pharyngeal Material  enters airway, CONTACTS cords and not ejected out Pharyngeal- Thin Teaspoon -- Pharyngeal -- Pharyngeal- Thin Cup Delayed swallow initiation-pyriform sinuses;Delayed swallow initiation-vallecula Pharyngeal -- Pharyngeal- Thin Straw Delayed swallow initiation-vallecula;Delayed swallow initiation-pyriform sinuses Pharyngeal -- Pharyngeal- Puree Delayed swallow initiation-vallecula;Delayed swallow initiation-pyriform sinuses Pharyngeal -- Pharyngeal- Mechanical Soft -- Pharyngeal -- Pharyngeal- Regular Delayed swallow initiation-vallecula Pharyngeal -- Pharyngeal- Multi-consistency -- Pharyngeal -- Pharyngeal- Pill -- Pharyngeal -- Pharyngeal Comment --  CHL IP CERVICAL ESOPHAGEAL PHASE 02/24/2020 Cervical Esophageal Phase WFL Pudding Teaspoon -- Pudding Cup -- Honey Teaspoon -- Honey Cup -- Nectar Teaspoon -- Nectar Cup -- Nectar Straw -- Thin Teaspoon -- Thin Cup -- Thin Straw -- Puree -- Mechanical Soft -- Regular -- Multi-consistency -- Pill -- Cervical Esophageal Comment -- Blenda Mounts Laurice 02/24/2020, 2:58 PM              ECHOCARDIOGRAM COMPLETE  Result Date: 02/24/2020    ECHOCARDIOGRAM REPORT   Patient Name:   Jonathan [name] Date of Exam: 02/24/2020 Medical Rec #:  [id number]          Height:       67.0 in Accession #:    [id number]         Weight:       175.5 lb Date of Birth:  [birth year]          BSA:          1.913 m Patient Age:    59 years           BP:           159/96 mmHg Patient Gender: M                  HR:           106 bpm. Exam Location:  Inpatient Procedure: 2D Echo and Intracardiac Opacification Agent Indications:    Stroke 434.91 / I163.9  History:        Patient has no prior history of Echocardiogram examinations.                 Peripheral arterial disease, underwent stenting, complicated by                 thrombosis requiring thrombectomy and LLE fasciotomy. Developed                 left facial droop. 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  Sonographer:    Leta Jungling RDCS Referring Phys:  [id number] Ashok Sawaya IMPRESSIONS  1. Left ventricular ejection fraction, by estimation, is 60 to 65%. Left ventricular ejection fraction by 2D MOD biplane is 63.2 %. The left ventricle has normal function. The left ventricle has no regional wall motion abnormalities. Left ventricular diastolic parameters are indeterminate.  2. Right ventricular systolic function is normal. The right ventricular size is normal. There is normal pulmonary artery systolic pressure. The estimated right ventricular systolic pressure is 30.9 mmHg.  3. The mitral valve is grossly normal. No evidence of mitral valve regurgitation. No evidence of mitral stenosis.  4. The aortic valve is tricuspid. Aortic valve regurgitation is not visualized. No aortic stenosis is present.  5. The inferior vena cava is normal in size with greater than 50% respiratory variability, suggesting right atrial  pressure of 3 mmHg. Conclusion(s)/Recommendation(s): No intracardiac source of embolism detected on this transthoracic study. A transesophageal echocardiogram is recommended to exclude cardiac source of embolism if clinically indicated. FINDINGS  Left Ventricle: Left ventricular ejection fraction, by estimation, is 60 to 65%. Left ventricular ejection fraction by 2D MOD biplane is 63.2 %. The left ventricle has normal function. The left ventricle has no regional wall motion abnormalities. Definity contrast agent was given IV to delineate the left ventricular endocardial borders. The left ventricular internal cavity size was normal in size. There is no left ventricular hypertrophy. Left ventricular diastolic parameters are indeterminate. Right Ventricle: The right ventricular size is normal. No increase in right ventricular wall thickness. Right ventricular systolic function is normal. There is normal pulmonary artery systolic pressure. The tricuspid regurgitant velocity is 2.64 m/s, and  with an assumed right atrial pressure of 3 mmHg, the estimated right  ventricular systolic pressure is 30.9 mmHg. Left Atrium: Left atrial size was normal in size. Right Atrium: Right atrial size was normal in size. Pericardium: Trivial pericardial effusion is present. Presence of pericardial fat pad. Mitral Valve: The mitral valve is grossly normal. No evidence of mitral valve regurgitation. No evidence of mitral valve stenosis. Tricuspid Valve: The tricuspid valve is grossly normal. Tricuspid valve regurgitation is trivial. No evidence of tricuspid stenosis. Aortic Valve: The aortic valve is tricuspid. Aortic valve regurgitation is not visualized. No aortic stenosis is present. Pulmonic Valve: The pulmonic valve was grossly normal. Pulmonic valve regurgitation is not visualized. No evidence of pulmonic stenosis. Aorta: The aortic root is normal in size and structure. Venous: The inferior vena cava is normal in size with greater than 50% respiratory variability, suggesting right atrial pressure of 3 mmHg. IAS/Shunts: The atrial septum is grossly normal.  LEFT VENTRICLE PLAX 2D                        Biplane EF (MOD) LVIDd:         4.90 cm         LV Biplane EF:   Left LVIDs:         3.60 cm                          ventricular LV PW:         0.90 cm                          ejection LV IVS:        1.10 cm                          fraction by LVOT diam:     1.90 cm                          2D MOD LV SV:         43                               biplane is LV SV Index:   22                               63.2 %. LVOT Area:     2.84 cm  Diastology                                LV e' lateral:   8.93 cm/s LV Volumes (MOD)               LV E/e' lateral: 11.4 LV vol d, MOD    98.1 ml       LV e' medial:    6.08 cm/s A2C:                           LV E/e' medial:  16.8 LV vol d, MOD    88.1 ml A4C: LV vol s, MOD    53.3 ml A2C: LV vol s, MOD    23.4 ml A4C: LV SV MOD A2C:   44.8 ml LV SV MOD A4C:   88.1 ml LV SV MOD BP:    60.6 ml RIGHT VENTRICLE RV S prime:      11.40 cm/s TAPSE (M-mode): 1.9 cm LEFT ATRIUM             Index       RIGHT ATRIUM           Index LA diam:        3.90 cm 2.04 cm/m  RA Area:     13.40 cm LA Vol (A2C):   50.2 ml 26.24 ml/m RA Volume:   34.30 ml  17.93 ml/m LA Vol (A4C):   63.9 ml 33.40 ml/m LA Biplane Vol: 58.4 ml 30.53 ml/m  AORTIC VALVE LVOT Vmax:   88.30 cm/s LVOT Vmean:  53.100 cm/s LVOT VTI:    0.151 m  AORTA Ao Root diam: 3.30 cm MITRAL VALVE                TRICUSPID VALVE MV Area (PHT): 7.16 cm     TR Peak grad:   27.9 mmHg MV Decel Time: 106 msec     TR Vmax:        264.00 cm/s MV E velocity: 102.00 cm/s MV A velocity: 49.20 cm/s   SHUNTS MV E/A ratio:  2.07         Systemic VTI:  0.15 m                             Systemic Diam: 1.90 cm Lennie Odor MD Electronically signed by Lennie Odor MD Signature Date/Time: 02/24/2020/1:09:12 PM    Final    CT HEAD CODE STROKE WO CONTRAST  Result Date: 02/23/2020 CLINICAL DATA:  Code stroke. Neuro deficit, acute, stroke suspected. Mental status change at 11 a.m., last known well 8 a.m. this morning. EXAM: CT HEAD WITHOUT CONTRAST TECHNIQUE: Contiguous axial images were obtained from the base of the skull through the vertex without intravenous contrast. COMPARISON:  Brain MRI 09/17/2018 FINDINGS: Brain: There is a large acute demarcated ischemic infarct within the left MCA vascular territory affecting the left frontal, temporal and parietal lobes as well as left insula and subinsular region. The left caudate and lentiform nuclei are also affected. No evidence of hemorrhagic conversion. There is mild associated mass effect with 3 mm rightward midline shift. Redemonstrated superimposed small chronic infarcts within the left cerebral hemisphere. Background mild generalized parenchymal atrophy and chronic small vessel ischemic disease. No extra-axial fluid collection. No evidence of intracranial mass. Vascular: No hyperdense vessel Skull: Atherosclerotic calcifications. Sinuses/Orbits:  Visualized orbits show no acute finding. Mild ethmoid sinus mucosal thickening. No significant mastoid effusion. ASPECTS [facility] Stroke Program Early CT Score) - Ganglionic level infarction (caudate, lentiform nuclei, internal capsule, insula, M1-M3 cortex): 2 - Supraganglionic infarction (M4-M6 cortex): 2 Total score (0-10 with 10 being normal): 4 These results were called by telephone at the time of interpretation on 02/23/2020 at 12:20 pm to provider Erick Blinks, who verbally acknowledged these results. IMPRESSION: Large acute demarcated left MCA vascular territory as described. ASPECTS is 4. No evidence of hemorrhagic conversion. Mild mass effect with 3 mm rightward midline shift. Stable background mild generalized parenchymal atrophy and chronic small vessel ischemic disease. Superimposed small chronic infarcts within the left cerebral hemisphere. Electronically Signed   By: Jackey Loge DO   On: 02/23/2020 12:22   CT ANGIO HEAD CODE STROKE  Result Date: 02/23/2020 CLINICAL DATA:  Neuro deficit, acute, stroke suspected. EXAM: CT ANGIOGRAPHY HEAD AND NECK CT PERFUSION BRAIN TECHNIQUE: Multidetector CT imaging of the head and neck was performed using the standard protocol during bolus administration of intravenous contrast. Multiplanar CT image reconstructions and MIPs were obtained to evaluate the vascular anatomy. Carotid stenosis measurements (when applicable) are obtained utilizing NASCET criteria, using the distal internal carotid diameter as the denominator. Multiphase CT imaging of the brain was performed following IV bolus contrast injection. Subsequent parametric perfusion maps were calculated using RAPID software. CONTRAST:  OMNIPAQUE IOHEXOL 350 MG/ML SOLN COMPARISON:  Noncontrast head CT performed earlier the same day. FINDINGS: CTA NECK FINDINGS Aortic arch: The left vertebral artery arises directly from the aortic arch. Atherosclerotic plaque within the visualized aortic arch and  proximal major branch vessels of the neck. No hemodynamically significant innominate or proximal subclavian artery stenosis. Right carotid system: The CCA and ICA are patent within the neck. Atherosclerotic plaque within these vessels. Most notably there is at least moderate calcified plaque within the carotid bifurcation. Exact quantification of stenosis within the proximal right ICA is difficult due to motion artifact and blooming artifact of calcified plaque at this site. Additionally, there is 50-60% stenosis of the mid cervical ICA. Left carotid system: The CCA is patent to the bifurcation. 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 Vertebral arteries: The dominant right vertebral artery is patent throughout the neck. Moderate/severe atherosclerotic narrowing within the V1 right vertebral artery. The non dominant left vertebral artery is developmentally diminutive, but patent throughout the neck Skeleton: Reversal of the expected cervical lordosis. Cervical spondylosis with multilevel disc space narrowing, posterior disc osteophytes and uncovertebral hypertrophy. Other neck: No neck mass or cervical lymphadenopathy. Upper chest: No consolidation within the imaged lung apices. 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 There is minimal reconstitution of flow within the supraclinoid left ICA. The M1 and M2 left middle cerebral arteries are patent, although with asymmetrically decreased opacification as compared to the right side. Additionally, there is a moderate to moderately severe focal stenosis within a superior division proximal M2 left MCA branch (series 8, image 17). The intracranial right internal carotid artery is patent. However, there are sites of  severe stenosis within the cavernous and paraclinoid segments. The M1 right middle cerebral artery is patent without significant stenosis. No right M2 proximal branch occlusion is identified. There is a moderate to moderately  severe stenosis within an inferior division mid M2 right MCA branch vessel (series 9, image 19). The A1 left anterior cerebral artery is hypoplastic. The A1 right anterior cerebral artery is dominant. The A2 and more distal anterior cerebral arteries are patent. However, there are multifocal high-grade stenoses within the bilateral A2 anterior cerebral arteries. Posterior circulation: The non dominant intracranial left vertebral artery is developmentally diminutive, but patent and terminates as the left PICA. The dominant intracranial right vertebral artery is patent, although with sites of moderate stenosis. The basilar artery is patent. The posterior cerebral arteries are patent. However, there is a high-grade focal stenosis within the P2 left PCA and high-grade focal stenosis within the right PCA at the P2/P3 junction. Venous sinuses: Within limitations of contrast timing, no convincing thrombus. Anatomic variants: Posterior communicating arteries are hypoplastic or absent bilaterally. Review of the MIP images confirms the above findings CT Brain Perfusion Findings: ASPECTS: 4 CBF (<30%) Volume: 89mL (left MCA vascular territory) Perfusion (Tmax>6.0s) volume: (left MCA vascular territory) Mismatch Volume: 60mL Infarction Location:Left MCA vascular territory. These results were called by telephone at the time of interpretation on 02/23/2020 at 12:40 pm to provider [facility] , who verbally acknowledged these results. IMPRESSION: CTA neck: 1. 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 2. There is at least moderate calcified plaque at the right carotid bifurcation. There is stenosis at the origin of the cervical right  vertebral artery which is poorly quantified due to motion degradation and blooming artifact from calcified plaque at this site. Carotid artery duplex is recommended for further evaluation. Additionally, there is apparent 50-60% stenosis of the mid cervical right ICA. 3. Moderate/severe atherosclerotic narrowing within the Dom V1 right vertebral artery. 4. The left vertebral artery is developmentally diminutive, but patent throughout the neck. CTA head: 1. The left internal carotid artery remains occluded intracranially throughout the siphon region. There is minimal reconstitution of flow within the supraclinoid left ICA. The M1 and M2 left middle cerebral arteries are patent, although with asymmetrically decreased opacification as compared to the right side. Additionally, there is a moderate to moderately severe focal stenosis within a superior division proximal M2 left MCA branch. 2. Additional intracranial atherosclerotic disease with multifocal stenoses, most notably as follows. 3. High-grade stenoses within the cavernous/paraclinoid right ICA. 4. Moderate to moderately severe focal stenosis within an inferior division mid M2 right MCA branch vessel. 5. Moderate stenosis within the V4 right vertebral artery. 6. High-grade stenosis within the P2 left PCA. 7. High-grade stenosis within the right PCA at the P2/P3 junction. 8. Multifocal high-grade stenoses within the A2 anterior cerebral arteries bilaterally. CT perfusion head: The perfusion software identifies an 89 mL core infarct within the left MCA vascular territory. The perfusion software identifies a 149 mL region of critically hypoperfused parenchyma within the left MCA vascular territory. Reported mismatch volume 60 mL. Electronically Signed   By: Jackey Loge DO   On: 02/23/2020 12:53   CT ANGIO NECK CODE STROKE  Result Date: 02/23/2020 CLINICAL DATA:  Neuro deficit, acute, stroke suspected. EXAM: CT ANGIOGRAPHY HEAD AND NECK CT PERFUSION BRAIN  TECHNIQUE: Multidetector CT imaging of the head and neck was performed using the standard protocol during bolus administration of intravenous contrast. Multiplanar CT image reconstructions and MIPs were obtained to evaluate the vascular anatomy. Carotid stenosis measurements (when applicable) are obtained utilizing NASCET criteria, using the distal internal carotid diameter as the denominator. Multiphase CT imaging  of the brain was performed following IV bolus contrast injection. Subsequent parametric perfusion maps were calculated using RAPID software. CONTRAST:  OMNIPAQUE IOHEXOL 350 MG/ML SOLN COMPARISON:  Noncontrast head CT performed earlier the same day. FINDINGS: CTA NECK FINDINGS Aortic arch: The left vertebral artery arises directly from the aortic arch. Atherosclerotic plaque within the visualized aortic arch and proximal major branch vessels of the neck. No hemodynamically significant innominate or proximal subclavian artery stenosis. Right carotid system: The CCA and ICA are patent within the neck. Atherosclerotic plaque within these vessels. Most notably there is at least moderate calcified plaque within the carotid bifurcation. Exact quantification of stenosis within the proximal right ICA is difficult due to motion artifact and blooming artifact of calcified plaque at this site. Additionally, there is 50-60% stenosis of the mid cervical ICA. Left carotid system: The CCA is patent to the bifurcation. 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 Vertebral arteries: The dominant right vertebral artery is patent throughout the neck. Moderate/severe atherosclerotic narrowing within the V1 right vertebral artery. The non dominant left vertebral artery is developmentally diminutive, but patent throughout the neck Skeleton: Reversal of the expected cervical lordosis. Cervical spondylosis with multilevel disc space narrowing, posterior disc osteophytes and  uncovertebral hypertrophy. Other neck: No neck mass or cervical lymphadenopathy. Upper chest: No consolidation within the imaged lung apices. 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 There is minimal reconstitution of flow within the supraclinoid left ICA. The M1 and M2 left middle cerebral arteries are patent, although with asymmetrically decreased opacification as compared to the right side. Additionally, there is a moderate to moderately severe focal stenosis within a superior division proximal M2 left MCA branch (series 8, image 17). The intracranial right internal carotid artery is patent. However, there are sites of severe stenosis within the cavernous and paraclinoid segments. The M1 right middle cerebral artery is patent without significant stenosis. No right M2 proximal branch occlusion is identified. There is a moderate to moderately severe stenosis within an inferior division mid M2 right MCA branch vessel (series 9, image 19). The A1 left anterior cerebral artery is hypoplastic. The A1 right anterior cerebral artery is dominant. The A2 and more distal anterior cerebral arteries are patent. However, there are multifocal high-grade stenoses within the bilateral A2 anterior cerebral arteries. Posterior circulation: The non dominant intracranial left vertebral artery is developmentally diminutive, but patent and terminates as the left PICA. The dominant intracranial right vertebral artery is patent, although with sites of moderate stenosis. The basilar artery is patent. The posterior cerebral arteries are patent. However, there is a high-grade focal stenosis within the P2 left PCA and high-grade focal stenosis within the right PCA at the P2/P3 junction. Venous sinuses: Within limitations of contrast  timing, no convincing thrombus. Anatomic variants: Posterior communicating arteries are hypoplastic or absent bilaterally. Review of the MIP images confirms the above findings CT Brain Perfusion Findings: ASPECTS: 4 CBF (<30%) Volume: 70mL (left MCA vascular territory) Perfusion (Tmax>6.0s) volume: (left MCA vascular territory) Mismatch Volume: 43mL Infarction Location:Left MCA vascular territory. These results were called by telephone at the time of interpretation on 02/23/2020 at 12:40 pm to provider [facility] , who verbally acknowledged these results. IMPRESSION: CTA neck: 1. The cervical left ICA is occluded from  its origin and remains occluded throughout the remainder of the neck. 2. There is at least moderate calcified plaque at the right carotid bifurcation. There is stenosis at the origin of the cervical right vertebral artery which is poorly quantified due to motion degradation and blooming artifact from calcified plaque at this site. Carotid artery duplex is recommended for further evaluation. Additionally, there is apparent 50-60% stenosis of the mid cervical right ICA. 3. Moderate/severe atherosclerotic narrowing within the Dom V1 right vertebral artery. 4. The left vertebral artery is developmentally diminutive, but patent throughout the neck. CTA head: 1. The left internal carotid artery remains occluded intracranially throughout the siphon region. There is minimal reconstitution of flow within the supraclinoid left ICA. The M1 and M2 left middle cerebral arteries are patent, although with asymmetrically decreased opacification as compared to the right side. Additionally, there is a moderate to moderately severe focal stenosis within a superior division proximal M2 left MCA branch. 2. Additional intracranial atherosclerotic disease with multifocal stenoses, most notably as follows. 3. High-grade stenoses within the cavernous/paraclinoid right ICA. 4. Moderate to moderately severe focal  stenosis within an inferior division mid M2 right MCA branch vessel. 5. Moderate stenosis within the V4 right vertebral artery. 6. High-grade stenosis within the P2 left PCA. 7. High-grade stenosis within the right PCA at the P2/P3 junction. 8. Multifocal high-grade stenoses within the A2 anterior cerebral arteries bilaterally. CT perfusion head: The perfusion software identifies an 89 mL core infarct within the left MCA vascular territory. The perfusion software identifies a 149 mL region of critically hypoperfused parenchyma within the left MCA vascular territory. Reported mismatch volume 60 mL. Electronically Signed   By: Jackey LogeKyle  Golden DO   On: 02/23/2020 12:53    PHYSICAL EXAM  Temp:  [97.9 F (36.6 C)-100.3 F (37.9 C)] 100.3 F (37.9 C) (08/13 1625) Pulse Rate:  [62-79] 71 (08/13 1625) Resp:  [18-28] 20 (08/13 1625) BP: (142-169)/(69-87) 142/75 (08/13 1625) SpO2:  [90 %-98 %] 94 % (08/13 1625)  General - Well nourished, well developed, in no apparent distress. Gurgly sound in the throat, persistent  Ophthalmologic - fundi not visualized due to noncooperation.  Cardiovascular - regular rate and rhythm today, not in afib.  Neuro - awake alert, still largely global aphasia with word salad. Able to close eyes on command but then perseverated on it. Not able to follow peripheral commands, but able to pantomime. Not able to name but able to repeat 1-2 words. No gaze palsy, able to track bilaterally. Not cooperative on visual field testing, but seems to have full visual field. Right facial droop. Tongue midline in mouth. LUE 5/5, RUE flaccid. LLE at least 4/5 and RLE 2-/5 proximally. Not moving toes bilaterally. Sensation, coordination not cooperative and gait not tested.   ASSESSMENT/PLAN Mr. Timmothy SoursRaymond C Spagnoli is a 59 y.o. male with history of hypertension, hyperlipidemia, smoker, stroke in 09/2019 admitted for bilateral acute limb ischemia status post stenting with reocclusion status post  thrombectomy and endarterectomy as well as left leg fasciotomy.  Developed aphasia and right hemiplegia.  CT showed left large MCA infarct.  [facility]. No tPA given due to on argatroban and established stroke.    Stroke:  left large MCA infarct due to left ICA occlusion most likely due to chronic left carotid artery stenosis/occlusion in the setting of operation (possible hypotension) vs. PAF  CT head left MCA large infarct  CTA head and neck left ICA occlusion, right ICA bulb moderate stenosis  with mid cervical right ICA 50 to 60% stenosis.  High-grade stenosis right ICA siphon.  Bilateral M2 moderate to severe focal stenosis.  Right V1 and V4 moderate to severe stenosis.  High-grade stenosis bilateral P2 and A2.  Carotid Doppler left ICA occluded at origin, right ICA less than 50% stenosis.  Repeat CT left MCA infarct increased size and surrounding edema  CT 8/11 stable large left MCA infarct with MLS 4-60mm  Repeat CT 8/13 stable large left MCA infarct with mildly improved midline shift  2D Echo EF 60 to 65%  LDL 71  HgbA1c 6.2  lovenox for VTE prophylaxis   Argatroban prior to admission, now on aspirin 325 mg daily and clopidogrel 75 mg daily. May consider Eliquis 10-14 days post stroke given large size of infarct  Patient counseled to be compliant with his antithrombotic medications  Ongoing aggressive stroke risk factor management  Therapy recommendations: Pending  Disposition: Pending  Cerebral edema  CT showed large left MCA infarct with mild midline shift  On 3% saline -> off now -> 1/2 NS @ 50 -> off  Sodium goal 150-155  Na 143->152->153->154->160->150  CT 8/11 showed slight worsening midline shift 4-21mm  CT 8/13 stable right large MCA infarct with mildly improved midline shift  Allow sodium gradually trending down  PAF  PAF on EKGs  Trop 2259->2260->3089  No AC at this time due to large left MCA infarcts - Continue  DAPT for now  May consider Eliquis 7 to 10 days post stroke given large size of infarct  HIT ruled out  Platelet 170-148-126-117->212-> 229  Was on argatroban, now off  HIT panel neg  On lovenox for DVT prophylaxis   Bilateral ischemic lower limb  S/p bilateral iliac artery stents  Bilateral stent reocclusion -> bilateral thrombectomy and endarterectomy + as well as left LE fasciotomy  VVS on board  On DAPT  History of stroke  09/2019 presented with slurred speech and incoherent speech.  MRI showed left MCA scattered infarcts.  No MRI or CTA performed.  However from MRI T2 imaging concerning for left ICA stenosis  Patient otherwise for admission at that time for further stroke work-up however patient refused and signed AMA.  Home medication including aspirin 325, Plavix 75 and Lipitor 80, not sure compliance  Hypertension . Stable at goal . Resumed home lisinopril 20 and amlodipine 10 . CCM also added metoprolol 50 tid -> 75 bid  Long term BP goal 130-150 given left ICA occlusion  Hyperlipidemia  Home meds: Lipitor 80  LDL 71, goal < 70  Now on Lipitor 80  Continue statin at discharge  Transaminitis  AST/ALT 203/46 -> 163/56-> 96/75  Continue to monitor  Okay to continue Lipitor at this time  Acute blood loss anemia  Hemoglobin 12.0-9.3-7.2-7.4-9.4->11.5->10.4->10.1->11.0  Close monitoring  Transfuse PRBC if hemoglobin less than 7  Dysphagia Aspiration   Passed swallow  Now on dysphagia 1 diet and thin liquid  Speech on board  Aspiration precaution  Chest PT  3% neb  NT suction PRN  Tobacco abuse  Current smoker  Smoking cessation counseling provided  Nicotine patch provided  Pt is willing to quit  Other Stroke Risk Factors  Multifocal severe vasculopathy  Other Active Problems  Hypokalemia K 3.3->3.9->3.2->2.7->3.4->3.5  Fever T-max 100.6-> afebrile->100.3  Hospital day # 5  This patient is critically ill due to  left large MCA infarct, cerebral edema, A. fib RVR, aspiration pneumonia and at significant risk of neurological worsening, death form recurrent  stroke, brain herniation, hemorrhagic conversion, seizure, heart failure, sepsis. This patient's care requires constant monitoring of vital signs, hemodynamics, respiratory and cardiac monitoring, review of multiple databases, neurological assessment, discussion with family, other specialists and medical decision making of high complexity. I spent 30 minutes of neurocritical care time in the care of this patient. I discussed with CCM resident.   Cameryn Schum, MD [facility] 02/28/2020 5:38 PM    To contact Stroke Continuity provider, please refer to [email or web address]. After hours, contact General Neurology

## 2020-02-28 NOTE — Progress Notes (Signed)
  Speech Language Pathology Treatment: Dysphagia  Patient Details Name: Jonathan [name] MRN: [id number] DOB: 1961/03/17 Today's Date: 02/28/2020 Time: 0102-7253 SLP Time Calculation (min) (ACUTE ONLY): 13 min  Assessment / Plan / Recommendation Clinical Impression  Pt sounds congested at baseline, but has no overt coughing or changes in respiration during self-feeding. He still requires full hand-over-hand assist to regulate pacing though, and per RN and OT, pt is consuming food/liquid on trays at very rapid rates. Discussed with PCCM MD as well - concern is that pt could be intermittently aspirating in the setting of impulsivity. Will plan to repeat MBS with additional challenging out of precaution.    HPI HPI: 59 y.o. male with a history of hypertension, hyperlipidemia, CVA, severe PAD  who presented with acute critical limb ischemia secondary to occlusion of the iliac artery who underwent stenting on 8/6 at [facility].  He was taken back to the OR on 8/7 found to have acute thrombosis of bilateral iliac stents underwent thrombectomy and endarterectomy;  fasciotomies of the left leg. Extubated 8/8. Developed right hemiplegia and aphasia morning of 8/8. CTH: large territory infarct left MCA. Pt transferred to [facility] same day.        SLP Plan  MBS       Recommendations  Diet recommendations: Dysphagia 1 (puree);Thin liquid Liquids provided via: Cup;No straw Medication Administration: Whole meds with puree Supervision: Staff to assist with self feeding;Full supervision/cueing for compensatory strategies Compensations: Minimize environmental distractions;Slow rate;Small sips/bites Postural Changes and/or Swallow Maneuvers: Seated upright 90 degrees;Upright 30-60 min after meal                Oral Care Recommendations: Oral care BID Follow up Recommendations: Inpatient Rehab SLP Visit Diagnosis: Dysphagia, oropharyngeal phase (R13.12) Plan: MBS       GO                Mahala Menghini.,  M.A. CCC-SLP Acute Rehabilitation Services Pager [phone] Office [phone]  02/28/2020, 2:25 PM

## 2020-02-28 NOTE — Progress Notes (Signed)
Patient is off of hypertonic saline infusion given stable cerebral edema.  He will be transferred to 3 W telemetry floor.  Signed off to [facility] hospitalist for medical management.  Neurology and vascular surgery will continue following the patient.  Continue to monitor for sign of aspiration.  Doran Stabler, DO Internal Medicine Residency My pager: [phone]

## 2020-02-28 NOTE — Progress Notes (Addendum)
Progress Note    02/28/2020 8:07 AM * No surgery found *  Subjective:  My first examination of patient. Awake and alert. Expressive aphasia. Denies pain. Co-operative.  Is tolerating soft diet  Vitals:   02/28/20 0700 02/28/20 0751  BP: (!) 169/85   Pulse: 75   Resp: (!) 26   Temp:    SpO2: 91% 90%    Physical Exam: Cardiac:  RRR Lungs:  Good air movement; loose cough Incisions:  Left groin incision healing without drainage or erythema. Right groin Prevena in place with good seal and no output. Left lower leg dressings dry. Extremities:  Right hemiplegia. 2+ DP pulses bilaterally. Calves are soft. Abdomen:  Soft. ND Scrotum and penis edematous CBC    Component Value Date/Time   WBC 12.1 (H) 02/28/2020 0437   RBC 3.59 (L) 02/28/2020 0437   HGB 11.0 (L) 02/28/2020 0437   HGB 16.8 08/14/2012 1008   HCT 36.1 (L) 02/28/2020 0437   HCT 49.0 08/14/2012 1008   PLT 229 02/28/2020 0437   PLT 291 08/14/2012 1008   MCV 100.6 (H) 02/28/2020 0437   MCV 92 08/14/2012 1008   MCH 30.6 02/28/2020 0437   MCHC 30.5 02/28/2020 0437   RDW 14.1 02/28/2020 0437   RDW 13.5 08/14/2012 1008   LYMPHSABS 3.3 09/17/2019 0927   MONOABS 0.9 09/17/2019 0927   EOSABS 0.1 09/17/2019 0927   BASOSABS 0.1 09/17/2019 0927    BMET    Component Value Date/Time   NA 154 (H) 02/27/2020 2020   NA 138 08/14/2012 1008   K 3.4 (L) 02/27/2020 0149   K 3.9 08/14/2012 1008   CL 124 (H) 02/27/2020 0149   CL 103 08/14/2012 1008   CO2 27 02/27/2020 0149   CO2 27 08/14/2012 1008   GLUCOSE 150 (H) 02/27/2020 0149   GLUCOSE 84 08/14/2012 1008   BUN 19 02/27/2020 0149   BUN 10 08/14/2012 1008   CREATININE 0.76 02/27/2020 0149   CREATININE 0.70 08/14/2012 1008   CALCIUM 8.8 (L) 02/27/2020 0149   CALCIUM 9.3 08/14/2012 1008   GFRNONAA >60 02/27/2020 0149   GFRNONAA >60 08/14/2012 1008   GFRAA >60 02/27/2020 0149   GFRAA >60 08/14/2012 1008     Intake/Output Summary (Last 24 hours) at 02/28/2020  0807 Last data filed at 02/28/2020 0700 Gross per 24 hour  Intake 999.87 ml  Output 1550 ml  Net -550.13 ml    HOSPITAL MEDICATIONS Scheduled Meds: . amLODipine  10 mg Oral Daily  . aspirin  300 mg Rectal Daily   Or  . aspirin  325 mg Oral Daily  . atorvastatin  80 mg Oral Daily  . chlorhexidine  15 mL Mouth Rinse BID  . Chlorhexidine Gluconate Cloth  6 each Topical Daily  . clopidogrel  75 mg Oral Daily  . enoxaparin (LOVENOX) injection  40 mg Subcutaneous Q24H  . folic acid  1 mg Oral Daily  . lisinopril  40 mg Oral Daily  . mouth rinse  15 mL Mouth Rinse q12n4p  . metoprolol tartrate  75 mg Oral BID  . nicotine  21 mg Transdermal Q2000  . pantoprazole  40 mg Oral Q2000  . sodium chloride HYPERTONIC  4 mL Nebulization Q4H WA  . thiamine  100 mg Oral Daily   Continuous Infusions: . sodium chloride Stopped (02/28/20 0557)  . sodium chloride Stopped (02/25/20 1233)   PRN Meds:.sodium chloride, acetaminophen, albuterol, docusate sodium, ipratropium-albuterol, labetalol, ondansetron (ZOFRAN) IV, polyethylene glycol  Assessment: s/p bil  iliofemoral thomboembolectomy, left femoral endarterectomy, left 4 comp fasciotomy. LE well perfused. Right groin/scrotum ecchymotic, edematous. Hgb stable  Per neuro:Stroke:  left large MCA infarct due to left ICA occlusion most likely due to chronic left carotid artery stenosis/occlusion in the setting of operation (possible hypotension) vs. PAF   Plan: Plavix, ASA, statin continue. Remove Prevena today as per Dr. Myra Gianotti. - -DVT prophylaxis:  Plexipulse   Wendi Maya, PA-C [facility] [phone] 02/28/2020  8:07 AM    I agree with the above assessment and plan.   Durene Cal

## 2020-02-28 NOTE — Progress Notes (Addendum)
Modified Barium Swallow Progress Note  Patient Details  Name: Jonathan [name] MRN: [id number] Date of Birth: 30-Nov-1960  Today's Date: 02/28/2020  Modified Barium Swallow completed.  Full report located under Chart Review in the Imaging Section.  Brief recommendations include the following:  Clinical Impression    Pt's oropharyngeal swallow is stable compared to MBS completed on 8/9 even with pt self-feeding throughout this study (see that note for more specifics regarding physiology). Pt was allowed to self-feed at his own rate, drinking cups of thin barium without stopping. Across multiple small cupfuls, there was no aspiration. Trace penetration occurred x2 with thin liquids but cleared spontaneously as he continued to swallow additional boluses. Would continue Dys 1 (puree) diet and thin liquids with full supervision for safety.   Swallow Evaluation Recommendations       SLP Diet Recommendations: Dysphagia 1 (Puree) solids;Thin liquid   Liquid Administration via: Cup;Straw   Medication Administration: Whole meds with puree   Supervision: Full supervision/cueing for compensatory strategies;Staff to assist with self feeding   Compensations: Minimize environmental distractions;Slow rate;Small sips/bites   Postural Changes: Seated upright at 90 degrees;Remain semi-upright after after feeds/meals (Comment)   Oral Care Recommendations: Oral care BID   Other Recommendations: Have oral suction available    Mahala Menghini., M.A. CCC-SLP Acute Rehabilitation Services Pager [phone] Office [phone]  02/28/2020,4:11 PM

## 2020-02-28 NOTE — Progress Notes (Signed)
Report given to [facility] on 3W.

## 2020-02-29 LAB — BASIC METABOLIC PANEL
Anion gap: 9 (ref 5–15)
BUN: 18 mg/dL (ref 6–20)
CO2: 27 mmol/L (ref 22–32)
Calcium: 8.6 mg/dL — ABNORMAL LOW (ref 8.9–10.3)
Chloride: 107 mmol/L (ref 98–111)
Creatinine, Ser: 0.68 mg/dL (ref 0.61–1.24)
GFR calc Af Amer: >60 mL/min (ref >60)
GFR calc non Af Amer: >60 mL/min (ref >60)
Glucose, Bld: 135 mg/dL — ABNORMAL HIGH (ref 70–99)
Potassium: 3 mmol/L — ABNORMAL LOW (ref 3.5–5.1)
Sodium: 143 mmol/L (ref 135–145)

## 2020-02-29 LAB — CBC
HCT: 32.7 % — ABNORMAL LOW (ref 39.0–52.0)
Hemoglobin: 10.5 g/dL — ABNORMAL LOW (ref 13.0–17.0)
MCH: 31.1 pg (ref 26.0–34.0)
MCHC: 32.1 g/dL (ref 30.0–36.0)
MCV: 96.7 fL (ref 80.0–100.0)
Platelets: 274 10*3/uL (ref 150–400)
RBC: 3.38 MIL/uL — ABNORMAL LOW (ref 4.22–5.81)
RDW: 13.3 % (ref 11.5–15.5)
WBC: 11.3 10*3/uL — ABNORMAL HIGH (ref 4.0–10.5)
nRBC: 0.2 % (ref 0.0–0.2)

## 2020-02-29 MED ORDER — POTASSIUM CHLORIDE CRYS ER 20 MEQ PO TBCR
40.0000 meq | EXTENDED_RELEASE_TABLET | ORAL | Status: AC
  Administered 2020-02-29 (×2): 40 meq via ORAL
  Filled 2020-02-29 (×2): qty 2

## 2020-02-29 MED ORDER — SODIUM CHLORIDE 3 % IN NEBU
4.0000 mL | INHALATION_SOLUTION | Freq: Two times a day (BID) | RESPIRATORY_TRACT | Status: DC
  Administered 2020-02-29 – 2020-03-01 (×3): 4 mL via RESPIRATORY_TRACT
  Filled 2020-02-29 (×4): qty 4

## 2020-02-29 NOTE — Progress Notes (Signed)
STROKE TEAM PROGRESS NOTE   SUBJECTIVE (INTERVAL HISTORY) No family is at bedside. Patient lying in bed, awake alert, still has aphasia.  Speech therapy has cleared him for dysphagia 1 diet but he remains aphasic.  Vital signs stable.  No changes in exam. OBJECTIVE Temp:  [97.5 F (36.4 C)-100.3 F (37.9 C)] 98.2 F (36.8 C) (08/14 0427) Pulse Rate:  [65-76] 68 (08/14 0427) Cardiac Rhythm: Normal sinus rhythm (08/13 1931) Resp:  [17-26] 18 (08/14 0427) BP: (142-169)/(69-85) 144/72 (08/14 0427) SpO2:  [90 %-98 %] 93 % (08/14 0427)  Recent Labs  Lab 02/23/20 0727 02/23/20 1128 02/23/20 1553 02/28/20 0731 02/28/20 1136  GLUCAP 130* 138* 146* 124* 196*   Recent Labs  Lab 02/23/20 1741 02/23/20 2113 02/25/20 0424 02/25/20 0630 02/26/20 0825 02/26/20 1520 02/27/20 0149 02/27/20 0734 02/27/20 1311 02/27/20 2020 02/28/20 0801 02/28/20 1332 02/29/20 0305  NA 139   < > 152*   < > 154*  156*   < > 160*   < > 156* 154* 151* 150* 143  K 3.3*   < > 3.2*  --  2.7*  --  3.4*  --   --   --   --  3.5 3.0*  CL 105   < > 116*  --  118*  --  124*  --   --   --   --  113* 107  CO2 24   < > 22  --  25  --  27  --   --   --   --  27 27  GLUCOSE 159*   < > 155*  --  253*  --  150*  --   --   --   --  134* 135*  BUN 11   < > 10  --  15  --  19  --   --   --   --  21* 18  CREATININE 0.68   < > 0.72  --  0.75  --  0.76  --   --   --   --  0.69 0.68  CALCIUM 7.9*   < > 8.7*   < > 8.7*  --  8.8*  --   --   --   --  8.9 8.6*  MG 1.9  --  2.2  --   --   --   --   --   --   --   --   --   --   PHOS 1.9*  --   --   --   --   --   --   --   --   --   --   --   --    < > = values in this interval not displayed.   Recent Labs  Lab 02/23/20 1741 02/25/20 0424 02/27/20 0149  AST 203* 163* 96*  ALT 46* 56* 75*  ALKPHOS 34* 42 37*  BILITOT 0.6 1.1 0.8  PROT 4.6* 6.4* 5.6*  ALBUMIN 2.3* 2.8* 2.5*   Recent Labs  Lab 02/25/20 0424 02/26/20 0825 02/27/20 0149 02/28/20 0437 02/29/20 0305   WBC 11.9* 9.0 9.5 12.1* 11.3*  HGB 11.5* 10.4* 10.1* 11.0* 10.5*  HCT 34.8* 32.9* 32.1* 36.1* 32.7*  MCV 94.8 97.6 99.1 100.6* 96.7  PLT 160 187 212 229 274   No results for input(s): CKTOTAL, CKMB, CKMBINDEX, TROPONINI in the last 168 hours. No results for input(s): LABPROT, INR in the last 72 hours. No results for  input(s): COLORURINE, LABSPEC, PHURINE, GLUCOSEU, HGBUR, BILIRUBINUR, KETONESUR, PROTEINUR, UROBILINOGEN, NITRITE, LEUKOCYTESUR in the last 72 hours.  Invalid input(s): APPERANCEUR     Component Value Date/Time   CHOL 132 02/24/2020 0748   TRIG 129 02/24/2020 0748   HDL 35 (L) 02/24/2020 0748   CHOLHDL 3.8 02/24/2020 0748   VLDL 26 02/24/2020 0748   LDLCALC 71 02/24/2020 0748   Lab Results  Component Value Date   HGBA1C 6.2 (H) 02/23/2020   No results found for: LABOPIA, COCAINSCRNUR, LABBENZ, AMPHETMU, THCU, LABBARB  No results for input(s): ETH in the last 168 hours.  I have personally reviewed the radiological images below and agree with the radiology interpretations.  CT HEAD WO CONTRAST  Result Date: 02/28/2020 CLINICAL DATA:  Stroke, follow-up. EXAM: CT HEAD WITHOUT CONTRAST TECHNIQUE: Contiguous axial images were obtained from the base of the skull through the vertex without intravenous contrast. COMPARISON:  CT head without contrast 02/26/2020, 02/24/2020, and 02/23/2019. FINDINGS: Brain: Large left MCA territory nonhemorrhagic infarct is again noted. Infarct involves the left lentiform nucleus and caudate head. Mass effect is again noted. Midline shift is stable to slightly improved at 4.5 mm. Partial effacement of left lateral ventricle is again noted. Sulcal effacement is stable. No significant extra-axial hemorrhage is present. No significant right-sided infarcts are present. Cerebellar atrophy is stable. No acute infarcts are present in the posterior fossa. Vascular: Atherosclerotic changes are present within the cavernous internal carotid arteries  bilaterally. No hyperdense vessel is present. Skull: Calvarium is intact. No focal lytic or blastic lesions are present. No significant extracranial soft tissue lesion is present. Sinuses/Orbits: The paranasal sinuses and mastoid air cells are clear. Remote right orbital blowout fracture is stable. Globes and orbits are otherwise within normal limits. IMPRESSION: 1. Stable appearance of large left MCA territory nonhemorrhagic infarct. 2. Stable to slightly improved midline shift. 3. No significant extra-axial hemorrhage. 4. Stable atrophy and white matter disease. Electronically Signed   By: Marin Roberts M.D.   On: 02/28/2020 06:33   CT HEAD WO CONTRAST  Result Date: 02/26/2020 CLINICAL DATA:  Follow-up examination for acute stroke. EXAM: CT HEAD WITHOUT CONTRAST TECHNIQUE: Contiguous axial images were obtained from the base of the skull through the vertex without intravenous contrast. COMPARISON:  Prior CT from 02/24/2020. FINDINGS: Brain: Continued interval evolution of large left MCA territory infarct is seen, overall stable in size and distribution from previous. Associated regional mass effect with partial effacement of the left lateral ventricle and up to 5 mm of left-to-right shift, slightly worsened from previous. No hydrocephalus or ventricular trapping. Basilar cisterns remain patent. No evidence for hemorrhagic transformation. No other new acute large vessel territory infarct. No intracranial hemorrhage. No extra-axial fluid collection. Vascular: No hyperdense vessel. Scattered vascular calcifications noted within the carotid siphons. Skull: Scalp soft tissues within normal limits. Calvarium unchanged. Sinuses/Orbits: Globes and orbital soft tissues demonstrate no acute finding. Paranasal sinuses and mastoid air cells remain largely clear. Other: None. IMPRESSION: 1. Continued interval evolution of large left MCA territory infarct, overall stable in size and distribution from previous.  Associated regional mass effect with up to 5 mm of left-to-right shift, slightly worsened from previous. No hydrocephalus or ventricular trapping. No evidence for hemorrhagic transformation. 2. No other new acute intracranial abnormality. Electronically Signed   By: Rise Mu M.D.   On: 02/26/2020 02:07   CT HEAD WO CONTRAST  Result Date: 02/24/2020 CLINICAL DATA:  Stroke follow-up EXAM: CT HEAD WITHOUT CONTRAST TECHNIQUE: Contiguous axial images were obtained  from the base of the skull through the vertex without intravenous contrast. COMPARISON:  02/23/2020 head CT. FINDINGS: Brain: Sequela of evolving left MCA territory infarct. 3-4 mm rightward midline shift is unchanged. No intracranial hemorrhage. No mass lesion. No ventriculomegaly or extra-axial fluid collection. Vascular: No hyperdense vessel. Bilateral skull base atherosclerotic calcifications. Skull: Negative for fracture or focal lesion. Sinuses/Orbits: Normal orbits. Soft tissue impacted left maxillary molar, unchanged. Mild right sphenoid sinus mucosal thickening. No mastoid effusion. Other: None. IMPRESSION: Evolving left MCA territory infarct with unchanged 3-4 mm rightward midline shift. No intracranial hemorrhage. Electronically Signed   By: Stana Bunting M.D.   On: 02/24/2020 09:29   CT HEAD WO CONTRAST  Result Date: 02/23/2020 CLINICAL DATA:  Stroke follow-up EXAM: CT HEAD WITHOUT CONTRAST TECHNIQUE: Contiguous axial images were obtained from the base of the skull through the vertex without intravenous contrast. COMPARISON:  Head CT 02/21/2020 FINDINGS: Brain: Large area of cytotoxic edema throughout the left MCA territory. No acute hemorrhage. 3 mm rightward midline shift. Vascular: Atherosclerotic calcification of the internal carotid arteries at the skull base. Skull: Normal. Negative for fracture or focal lesion. Sinuses/Orbits: No acute finding. Other: None. IMPRESSION: 1. Slightly increased cytotoxic edema within the  left MCA territory. 2. No acute hemorrhage. 3. 3 mm rightward midline shift. Electronically Signed   By: Deatra Robinson M.D.   On: 02/23/2020 22:00   US Carotid Bilateral  Result Date: 02/23/2020 CLINICAL DATA:  Slurred speech.  Carotid stenosis. EXAM: BILATERAL CAROTID DUPLEX ULTRASOUND TECHNIQUE: Wallace Cullens scale imaging, color Doppler and duplex ultrasound were performed of bilateral carotid and vertebral arteries in the neck. COMPARISON:  CTA neck 02/23/2020 FINDINGS: Criteria: Quantification of carotid stenosis is based on velocity parameters that correlate the residual internal carotid diameter with NASCET-based stenosis levels, using the diameter of the distal internal carotid lumen as the denominator for stenosis measurement. The following velocity measurements were obtained: RIGHT ICA: 117/63 cm/sec CCA: 130/38 cm/sec SYSTOLIC ICA/CCA RATIO:  0.9 ECA: 220 cm/sec LEFT ICA: Occluded CCA: 75/10 cm/sec ECA: 195 cm/sec RIGHT CAROTID ARTERY: Mild atherosclerotic disease in the right common carotid artery. Heterogeneous plaque at the right carotid bulb. External carotid artery is patent with normal waveform. Atherosclerotic plaque in the proximal internal carotid artery. Normal waveforms and velocities in the internal carotid artery. RIGHT VERTEBRAL ARTERY: Antegrade flow and normal waveform in the right vertebral artery. LEFT CAROTID ARTERY: Left common carotid artery is patent with intimal thickening. Left carotid artery is patent. External carotid artery is patent with normal waveform. 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 No flow in the mid or distal left internal carotid artery. LEFT VERTEBRAL ARTERY: Antegrade flow and normal waveform in the left vertebral artery. IMPRESSION: 1. 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 Findings correspond with recent CTA findings. 2. Atherosclerotic plaque involving the carotid arteries. Estimated degree of stenosis in the right internal  carotid artery is less than 50%. 3. Patent vertebral arteries with antegrade flow. Electronically Signed   By: Richarda Overlie M.D.   On: 02/23/2020 14:42   PERIPHERAL VASCULAR CATHETERIZATION  Result Date: 02/21/2020 See op note  CT CEREBRAL PERFUSION W CONTRAST  Result Date: 02/23/2020 CLINICAL DATA:  Neuro deficit, acute, stroke suspected. EXAM: CT ANGIOGRAPHY HEAD AND NECK CT PERFUSION BRAIN TECHNIQUE: Multidetector CT imaging of the head and neck was performed using the standard protocol during bolus administration of intravenous contrast. Multiplanar CT image reconstructions and MIPs were obtained to evaluate the vascular anatomy. Carotid  stenosis measurements (when applicable) are obtained utilizing NASCET criteria, using the distal internal carotid diameter as the denominator. Multiphase CT imaging of the brain was performed following IV bolus contrast injection. Subsequent parametric perfusion maps were calculated using RAPID software. CONTRAST:  OMNIPAQUE IOHEXOL 350 MG/ML SOLN COMPARISON:  Noncontrast head CT performed earlier the same day. FINDINGS: CTA NECK FINDINGS Aortic arch: The left vertebral artery arises directly from the aortic arch. Atherosclerotic plaque within the visualized aortic arch and proximal major branch vessels of the neck. No hemodynamically significant innominate or proximal subclavian artery stenosis. Right carotid system: The CCA and ICA are patent within the neck. Atherosclerotic plaque within these vessels. Most notably there is at least moderate calcified plaque within the carotid bifurcation. Exact quantification of stenosis within the proximal right ICA is difficult due to motion artifact and blooming artifact of calcified plaque at this site. Additionally, there is 50-60% stenosis of the mid cervical ICA. Left carotid system: The CCA is patent to the bifurcation. 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 Vertebral  arteries: The dominant right vertebral artery is patent throughout the neck. Moderate/severe atherosclerotic narrowing within the V1 right vertebral artery. The non dominant left vertebral artery is developmentally diminutive, but patent throughout the neck Skeleton: Reversal of the expected cervical lordosis. Cervical spondylosis with multilevel disc space narrowing, posterior disc osteophytes and uncovertebral hypertrophy. Other neck: No neck mass or cervical lymphadenopathy. Upper chest: No consolidation within the imaged lung apices. 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 There is minimal reconstitution of flow within the supraclinoid left ICA. The M1 and M2 left middle cerebral arteries are patent, although with asymmetrically decreased opacification as compared to the right side. Additionally, there is a moderate to moderately severe focal stenosis within a superior division proximal M2 left MCA branch (series 8, image 17). The intracranial right internal carotid artery is patent. However, there are sites of severe stenosis within the cavernous and paraclinoid segments. The M1 right middle cerebral artery is patent without significant stenosis. No right M2 proximal branch occlusion is identified. There is a moderate to moderately severe stenosis within an inferior division mid M2 right MCA branch vessel (series 9, image 19). The A1 left anterior cerebral artery is hypoplastic. The A1 right anterior cerebral artery is dominant. The A2 and more distal anterior cerebral arteries are patent. However, there are multifocal high-grade stenoses within the bilateral A2 anterior cerebral arteries. Posterior circulation: The non dominant intracranial left vertebral artery is developmentally diminutive, but  patent and terminates as the left PICA. The dominant intracranial right vertebral artery is patent, although with sites of moderate stenosis. The basilar artery is patent. The posterior cerebral arteries are patent. However, there is a high-grade focal stenosis within the P2 left PCA and high-grade focal stenosis within the right PCA at the P2/P3 junction. Venous sinuses: Within limitations of contrast timing, no convincing thrombus. Anatomic variants: Posterior communicating arteries are hypoplastic or absent bilaterally. Review of the MIP images confirms the above findings CT Brain Perfusion Findings: ASPECTS: 4 CBF (<30%) Volume: 89mL (left MCA vascular territory) Perfusion (Tmax>6.0s) volume: (left MCA vascular territory) Mismatch Volume: 60mL Infarction Location:Left MCA vascular territory. These results were called by telephone at the time of interpretation on 02/23/2020 at 12:40 pm  to provider [facility] , who verbally acknowledged these results. IMPRESSION: CTA neck: 1. 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 2. There is at least moderate calcified plaque at the right carotid bifurcation. There is stenosis at the origin of the cervical right vertebral artery which is poorly quantified due to motion degradation and blooming artifact from calcified plaque at this site. Carotid artery duplex is recommended for further evaluation. Additionally, there is apparent 50-60% stenosis of the mid cervical right ICA. 3. Moderate/severe atherosclerotic narrowing within the Dom V1 right vertebral artery. 4. The left vertebral artery is developmentally diminutive, but patent throughout the neck. CTA head: 1. The left internal carotid artery remains occluded intracranially throughout the siphon region. There is minimal reconstitution of flow within the supraclinoid left ICA. The M1 and M2 left middle cerebral arteries are patent, although with  asymmetrically decreased opacification as compared to the right side. Additionally, there is a moderate to moderately severe focal stenosis within a superior division proximal M2 left MCA branch. 2. Additional intracranial atherosclerotic disease with multifocal stenoses, most notably as follows. 3. High-grade stenoses within the cavernous/paraclinoid right ICA. 4. Moderate to moderately severe focal stenosis within an inferior division mid M2 right MCA branch vessel. 5. Moderate stenosis within the V4 right vertebral artery. 6. High-grade stenosis within the P2 left PCA. 7. High-grade stenosis within the right PCA at the P2/P3 junction. 8. Multifocal high-grade stenoses within the A2 anterior cerebral arteries bilaterally. CT perfusion head: The perfusion software identifies an 89 mL core infarct within the left MCA vascular territory. The perfusion software identifies a 149 mL region of critically hypoperfused parenchyma within the left MCA vascular territory. Reported mismatch volume 60 mL. Electronically Signed   By: Jackey Loge DO   On: 02/23/2020 12:53   DG CHEST PORT 1 VIEW  Result Date: 02/24/2020 CLINICAL DATA:  59 year old male with shortness of breath. EXAM: PORTABLE CHEST 1 VIEW COMPARISON:  Chest radiograph dated 02/22/2020. FINDINGS: Status post removal of the endotracheal and enteric tube. There is diffuse interstitial densities which may be chronic or represent edema or atypical pneumonia. No large pleural effusion. No pneumothorax. Stable cardiac silhouette. No acute osseous pathology. IMPRESSION: Interval removal of the endotracheal and enteric tubes. Electronically Signed   By: Elgie Collard M.D.   On: 02/24/2020 19:11   Portable Chest x-ray  Result Date: 02/22/2020 CLINICAL DATA:  Left cerebral artery and polyp stroke, postop bilateral iliofemoral thrombectomy, ETT EXAM: PORTABLE CHEST 1 VIEW COMPARISON:  01/12/2013 FINDINGS: Lungs are essentially clear.  No pleural effusion or  pneumothorax. The heart is normal in size. Endotracheal tube terminates 5 cm above the carina. Enteric tube terminates in the proximal gastric body. IMPRESSION: Endotracheal tube terminates 5 cm above the carina. Enteric tube terminates in the proximal gastric body. Electronically Signed   By: Charline Bills M.D.   On: 02/22/2020 08:54   DG Swallowing Func-Speech Pathology  Result Date: 02/28/2020 Objective Swallowing Evaluation: Type of Study: MBS-Modified Barium Swallow Study  Patient Details Name: Jonathan [name] MRN: [id number] Date of Birth: 04/04/1961 Today's Date: 02/28/2020 Time: SLP Start Time (ACUTE ONLY): 1457 -SLP Stop Time (ACUTE ONLY): 1513 SLP Time Calculation (min) (ACUTE ONLY): 16 min Past Medical History: Past Medical History: Diagnosis Date . Aphasia S/P CVA  . DDD (degenerative disc disease), cervical  . ED (erectile dysfunction)  . Hyperlipidemia  . Hypertension  . Peripheral arterial disease (HCC)  . Stroke (  HCC)  Past Surgical History: Past Surgical History: Procedure Laterality Date . FEMORAL-FEMORAL BYPASS GRAFT Bilateral 02/22/2020  Procedure: bilateral ileofemoral thrombectomy, left popliteal exposure and thrombectomy, four compartment fasciotomy, left femoral endarterectomy with patch angioplasty;  Surgeon: Nada Libman, MD;  Location: [facility] ORS;  Service: Vascular;  Laterality: Bilateral; . LOWER EXTREMITY ANGIOGRAPHY Left 02/21/2020  Procedure: LOWER EXTREMITY ANGIOGRAPHY;  Surgeon: Renford Dills, MD;  Location: [facility] INVASIVE CV LAB;  Service: Cardiovascular;  Laterality: Left; . PERIPHERAL VASCULAR CATHETERIZATION N/A 07/21/2015  Procedure: Abdominal Aortogram w/Lower Extremity;  Surgeon: Renford Dills, MD;  Location: [facility] INVASIVE CV LAB;  Service: Cardiovascular;  Laterality: N/A; . PERIPHERAL VASCULAR CATHETERIZATION  07/21/2015  Procedure: Lower Extremity Intervention;  Surgeon: Renford Dills, MD;  Location: [facility] INVASIVE CV LAB;  Service: Cardiovascular;; HPI: 59  y.o. male with a history of hypertension, hyperlipidemia, CVA, severe PAD  who presented with acute critical limb ischemia secondary to occlusion of the iliac artery who underwent stenting on 8/6 at [facility].  He was taken back to the OR on 8/7 found to have acute thrombosis of bilateral iliac stents underwent thrombectomy and endarterectomy;  fasciotomies of the left leg. Extubated 8/8. Developed right hemiplegia and aphasia morning of 8/8. CTH: large territory infarct left MCA. Pt transferred to [facility] same day.   Subjective: alert, aphasic Assessment / Plan / Recommendation CHL IP CLINICAL IMPRESSIONS 02/28/2020 Clinical Impression Pt's oropharyngeal swallow is stable compared to MBS completed on 8/9 even with pt self-feeding throughout this study (see that note for more specifics regarding physiology). Pt was allowed to self-feed at his own rate, drinking cups of thin barium without stopping. Across multiple small cupfuls, there was no aspiration. Trace penetration occurred x2 with thin liquids but cleared spontaneously as he continued to swallow additional boluses. Would continue Dys 1 (puree) diet and thin liquids with full supervision for safety.  SLP Visit Diagnosis Dysphagia, oropharyngeal phase (R13.12) Attention and concentration deficit following -- Frontal lobe and executive function deficit following -- Impact on safety and function Mild aspiration risk   CHL IP TREATMENT RECOMMENDATION 02/28/2020 Treatment Recommendations Therapy as outlined in treatment plan below   Prognosis 02/28/2020 Prognosis for Safe Diet Advancement Good Barriers to Reach Goals Language deficits Barriers/Prognosis Comment -- CHL IP DIET RECOMMENDATION 02/28/2020 SLP Diet Recommendations Dysphagia 1 (Puree) solids;Thin liquid Liquid Administration via Cup;Straw Medication Administration Whole meds with puree Compensations Minimize environmental distractions;Slow rate;Small sips/bites Postural Changes Seated upright at 90 degrees;Remain  semi-upright after after feeds/meals (Comment)   CHL IP OTHER RECOMMENDATIONS 02/28/2020 Recommended Consults -- Oral Care Recommendations Oral care BID Other Recommendations Have oral suction available   CHL IP FOLLOW UP RECOMMENDATIONS 02/28/2020 Follow up Recommendations Inpatient Rehab   CHL IP FREQUENCY AND DURATION 02/28/2020 Speech Therapy Frequency (ACUTE ONLY) min 2x/week Treatment Duration 2 weeks      CHL IP ORAL PHASE 02/28/2020 Oral Phase Impaired Oral - Pudding Teaspoon -- Oral - Pudding Cup -- Oral - Honey Teaspoon -- Oral - Honey Cup -- Oral - Nectar Teaspoon -- Oral - Nectar Cup -- Oral - Nectar Straw NT Oral - Thin Teaspoon -- Oral - Thin Cup Right anterior bolus loss;Weak lingual manipulation;Reduced posterior propulsion;Holding of bolus;Right pocketing in lateral sulci;Pocketing in anterior sulcus;Lingual/palatal residue;Piecemeal swallowing;Decreased bolus cohesion Oral - Thin Straw Right anterior bolus loss;Weak lingual manipulation;Reduced posterior propulsion;Holding of bolus;Right pocketing in lateral sulci;Pocketing in anterior sulcus;Lingual/palatal residue;Piecemeal swallowing;Decreased bolus cohesion Oral - Puree Right anterior bolus loss;Weak lingual manipulation;Reduced posterior propulsion;Holding of bolus;Right pocketing  in lateral sulci;Pocketing in anterior sulcus;Lingual/palatal residue;Piecemeal swallowing;Decreased bolus cohesion Oral - Mech Soft -- Oral - Regular NT Oral - Multi-Consistency -- Oral - Pill -- Oral Phase - Comment --  CHL IP PHARYNGEAL PHASE 02/28/2020 Pharyngeal Phase Impaired Pharyngeal- Pudding Teaspoon -- Pharyngeal -- Pharyngeal- Pudding Cup -- Pharyngeal -- Pharyngeal- Honey Teaspoon -- Pharyngeal -- Pharyngeal- Honey Cup -- Pharyngeal -- Pharyngeal- Nectar Teaspoon -- Pharyngeal -- Pharyngeal- Nectar Cup -- Pharyngeal -- Pharyngeal- Nectar Straw NT Pharyngeal -- Pharyngeal- Thin Teaspoon -- Pharyngeal -- Pharyngeal- Thin Cup Delayed swallow initiation-pyriform  sinuses;Delayed swallow initiation-vallecula Pharyngeal -- Pharyngeal- Thin Straw Delayed swallow initiation-pyriform sinuses;Delayed swallow initiation-vallecula Pharyngeal -- Pharyngeal- Puree Delayed swallow initiation-pyriform sinuses;Delayed swallow initiation-vallecula Pharyngeal -- Pharyngeal- Mechanical Soft -- Pharyngeal -- Pharyngeal- Regular NT Pharyngeal -- Pharyngeal- Multi-consistency -- Pharyngeal -- Pharyngeal- Pill -- Pharyngeal -- Pharyngeal Comment --  CHL IP CERVICAL ESOPHAGEAL PHASE 02/28/2020 Cervical Esophageal Phase WFL Pudding Teaspoon -- Pudding Cup -- Honey Teaspoon -- Honey Cup -- Nectar Teaspoon -- Nectar Cup -- Nectar Straw -- Thin Teaspoon -- Thin Cup -- Thin Straw -- Puree -- Mechanical Soft -- Regular -- Multi-consistency -- Pill -- Cervical Esophageal Comment -- Mahala Menghini., M.A. CCC-SLP Acute Rehabilitation Services Pager [phone] Office [phone] 02/28/2020, 4:22 PM              DG Swallowing Func-Speech Pathology  Result Date: 02/24/2020 Objective Swallowing Evaluation: Type of Study: MBS-Modified Barium Swallow Study  Patient Details Name: Jonathan [name] MRN: [id number] Date of Birth: [birth year] Today's Date: 02/24/2020 Time: SLP Start Time (ACUTE ONLY): 1310 -SLP Stop Time (ACUTE ONLY): 1340 SLP Time Calculation (min) (ACUTE ONLY): 30 min Past Medical History: Past Medical History: Diagnosis Date . Aphasia S/P CVA  . DDD (degenerative disc disease), cervical  . ED (erectile dysfunction)  . Hyperlipidemia  . Hypertension  . Peripheral arterial disease (HCC)  . Stroke [facility])  Past Surgical History: Past Surgical History: Procedure Laterality Date . FEMORAL-FEMORAL BYPASS GRAFT Bilateral 02/22/2020  Procedure: bilateral ileofemoral thrombectomy, left popliteal exposure and thrombectomy, four compartment fasciotomy, left femoral endarterectomy with patch angioplasty;  Surgeon: Nada Libman, MD;  Location: [facility] ORS;  Service: Vascular;  Laterality: Bilateral; . LOWER  EXTREMITY ANGIOGRAPHY Left 02/21/2020  Procedure: LOWER EXTREMITY ANGIOGRAPHY;  Surgeon: Renford Dills, MD;  Location: [facility] INVASIVE CV LAB;  Service: Cardiovascular;  Laterality: Left; . PERIPHERAL VASCULAR CATHETERIZATION N/A 07/21/2015  Procedure: Abdominal Aortogram w/Lower Extremity;  Surgeon: Renford Dills, MD;  Location: [facility] INVASIVE CV LAB;  Service: Cardiovascular;  Laterality: N/A; . PERIPHERAL VASCULAR CATHETERIZATION  07/21/2015  Procedure: Lower Extremity Intervention;  Surgeon: Renford Dills, MD;  Location: [facility] INVASIVE CV LAB;  Service: Cardiovascular;; HPI: 59 y.o. male with a history of hypertension, hyperlipidemia, CVA, severe PAD  who presented with acute critical limb ischemia secondary to occlusion of the iliac artery who underwent stenting on 8/6 at [facility].  He was taken back to the OR on 8/7 found to have acute thrombosis of bilateral iliac stents underwent thrombectomy and endarterectomy;  fasciotomies of the left leg. Extubated 8/8. Developed right hemiplegia and aphasia morning of 8/8. CTH: large territory infarct left MCA. Pt transferred to [facility] same day.   Subjective: alert, aphasic Assessment / Plan / Recommendation CHL IP CLINICAL IMPRESSIONS 02/24/2020 Clinical Impression Pt presents with a primary oral dysphagia c/b impaired bolus cohesion and control over release into the pharynx.  There was decreased sensation on the right, with barium residue in right lateral and anterior sulci and  limited awareness of its presence.  Pharyngeal function was largely intact, with adequate pharyngeal squeeze, no residue post-swallow, and effective laryngeal vestibule closure.  Occasionally, thin and nectar thick barium reached the pyriforms for a delayed period of time before the swallow was triggered.  On other occasions, the swallow trigger was timely.  There was one incident of penetration to the vocal folds when a bolus of nectar thick liquid prematurely escaped the oral cavity and entered  the laryngeal vestibule without eliciting a response.  Pt's impaired sensation appears to be the primary concern and puts him at greater risk of aspiration.  For now, recommend starting a dysphagia 1 diet, thin liquids from cup only - NO STRAWS; give meds whole in applesauce/pudding.  Due to pt's impulsivity, difficulty following commands, and poor sensation/awareness on right side of mouth/throat, he will require careful supervision during meals.  SLP will follow for safety/diet progression and therapeutic training.   SLP Visit Diagnosis Dysphagia, oropharyngeal phase (R13.12) Attention and concentration deficit following -- Frontal lobe and executive function deficit following -- Impact on safety and function Mild aspiration risk   CHL IP TREATMENT RECOMMENDATION 02/24/2020 Treatment Recommendations Therapy as outlined in treatment plan below   Prognosis 02/24/2020 Prognosis for Safe Diet Advancement Good Barriers to Reach Goals -- Barriers/Prognosis Comment -- CHL IP DIET RECOMMENDATION 02/24/2020 SLP Diet Recommendations Dysphagia 1 (Puree) solids;Thin liquid Liquid Administration via Cup;No straw Medication Administration Whole meds with puree Compensations Minimize environmental distractions;Slow rate;Small sips/bites Postural Changes --   CHL IP OTHER RECOMMENDATIONS 02/24/2020 Recommended Consults -- Oral Care Recommendations Oral care BID Other Recommendations --   CHL IP FOLLOW UP RECOMMENDATIONS 02/24/2020 Follow up Recommendations Inpatient Rehab   CHL IP FREQUENCY AND DURATION 02/24/2020 Speech Therapy Frequency (ACUTE ONLY) min 3x week Treatment Duration 2 weeks      CHL IP ORAL PHASE 02/24/2020 Oral Phase Impaired Oral - Pudding Teaspoon -- Oral - Pudding Cup -- Oral - Honey Teaspoon -- Oral - Honey Cup -- Oral - Nectar Teaspoon -- Oral - Nectar Cup -- Oral - Nectar Straw Right anterior bolus loss;Weak lingual manipulation;Reduced posterior propulsion;Holding of bolus;Right pocketing in lateral sulci;Pocketing in  anterior sulcus;Lingual/palatal residue;Piecemeal swallowing;Decreased bolus cohesion Oral - Thin Teaspoon -- Oral - Thin Cup Right anterior bolus loss;Weak lingual manipulation;Reduced posterior propulsion;Holding of bolus;Right pocketing in lateral sulci;Pocketing in anterior sulcus;Lingual/palatal residue;Piecemeal swallowing;Decreased bolus cohesion Oral - Thin Straw Right anterior bolus loss;Weak lingual manipulation;Reduced posterior propulsion;Holding of bolus;Right pocketing in lateral sulci;Pocketing in anterior sulcus;Lingual/palatal residue;Piecemeal swallowing;Decreased bolus cohesion Oral - Puree Right anterior bolus loss;Weak lingual manipulation;Reduced posterior propulsion;Holding of bolus;Right pocketing in lateral sulci;Pocketing in anterior sulcus;Lingual/palatal residue;Piecemeal swallowing;Decreased bolus cohesion Oral - Mech Soft -- Oral - Regular Right anterior bolus loss;Weak lingual manipulation;Reduced posterior propulsion;Holding of bolus;Right pocketing in lateral sulci;Pocketing in anterior sulcus;Lingual/palatal residue;Piecemeal swallowing;Decreased bolus cohesion Oral - Multi-Consistency -- Oral - Pill -- Oral Phase - Comment --  CHL IP PHARYNGEAL PHASE 02/24/2020 Pharyngeal Phase Impaired Pharyngeal- Pudding Teaspoon -- Pharyngeal -- Pharyngeal- Pudding Cup -- Pharyngeal -- Pharyngeal- Honey Teaspoon -- Pharyngeal -- Pharyngeal- Honey Cup -- Pharyngeal -- Pharyngeal- Nectar Teaspoon -- Pharyngeal -- Pharyngeal- Nectar Cup -- Pharyngeal -- Pharyngeal- Nectar Straw Delayed swallow initiation-pyriform sinuses;Delayed swallow initiation-vallecula;Penetration/Aspiration before swallow Pharyngeal Material enters airway, CONTACTS cords and not ejected out Pharyngeal- Thin Teaspoon -- Pharyngeal -- Pharyngeal- Thin Cup Delayed swallow initiation-pyriform sinuses;Delayed swallow initiation-vallecula Pharyngeal -- Pharyngeal- Thin Straw Delayed swallow initiation-vallecula;Delayed swallow  initiation-pyriform sinuses Pharyngeal -- Pharyngeal- Puree Delayed swallow initiation-vallecula;Delayed swallow  initiation-pyriform sinuses Pharyngeal -- Pharyngeal- Mechanical Soft -- Pharyngeal -- Pharyngeal- Regular Delayed swallow initiation-vallecula Pharyngeal -- Pharyngeal- Multi-consistency -- Pharyngeal -- Pharyngeal- Pill -- Pharyngeal -- Pharyngeal Comment --  CHL IP CERVICAL ESOPHAGEAL PHASE 02/24/2020 Cervical Esophageal Phase WFL Pudding Teaspoon -- Pudding Cup -- Honey Teaspoon -- Honey Cup -- Nectar Teaspoon -- Nectar Cup -- Nectar Straw -- Thin Teaspoon -- Thin Cup -- Thin Straw -- Puree -- Mechanical Soft -- Regular -- Multi-consistency -- Pill -- Cervical Esophageal Comment -- Blenda Mounts Laurice 02/24/2020, 2:58 PM              ECHOCARDIOGRAM COMPLETE  Result Date: 02/24/2020    ECHOCARDIOGRAM REPORT   Patient Name:   Jonathan [name] Date of Exam: 02/24/2020 Medical Rec #:  [id number]          Height:       67.0 in Accession #:    [id number]         Weight:       175.5 lb Date of Birth:  08-Jan-1961          BSA:          1.913 m Patient Age:    59 years           BP:           159/96 mmHg Patient Gender: M                  HR:           106 bpm. Exam Location:  Inpatient Procedure: 2D Echo and Intracardiac Opacification Agent Indications:    Stroke 434.91 / I163.9  History:        Patient has no prior history of Echocardiogram examinations.                 Peripheral arterial disease, underwent stenting, complicated by                 thrombosis requiring thrombectomy and LLE fasciotomy. Developed                 left facial droop. 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  Sonographer:    Leta Jungling RDCS Referring Phys: [id number] JINDONG XU IMPRESSIONS  1. Left ventricular ejection fraction, by estimation, is 60 to 65%. Left ventricular ejection fraction by 2D MOD biplane is 63.2 %. The left ventricle has normal function. The left ventricle has no regional wall motion abnormalities. Left ventricular  diastolic parameters are indeterminate.  2. Right ventricular systolic function is normal. The right ventricular size is normal. There is normal pulmonary artery systolic pressure. The estimated right ventricular systolic pressure is 30.9 mmHg.  3. The mitral valve is grossly normal. No evidence of mitral valve regurgitation. No evidence of mitral stenosis.  4. The aortic valve is tricuspid. Aortic valve regurgitation is not visualized. No aortic stenosis is present.  5. The inferior vena cava is normal in size with greater than 50% respiratory variability, suggesting right atrial pressure of 3 mmHg. Conclusion(s)/Recommendation(s): No intracardiac source of embolism detected on this transthoracic study. A transesophageal echocardiogram is recommended to exclude cardiac source of embolism if clinically indicated. FINDINGS  Left Ventricle: Left ventricular ejection fraction, by estimation, is 60 to 65%. Left ventricular ejection fraction by 2D MOD biplane is 63.2 %. The left ventricle has normal function. The left ventricle has no regional wall motion abnormalities. Definity contrast agent was given IV to delineate the left ventricular endocardial borders. The left ventricular internal  cavity size was normal in size. There is no left ventricular hypertrophy. Left ventricular diastolic parameters are indeterminate. Right Ventricle: The right ventricular size is normal. No increase in right ventricular wall thickness. Right ventricular systolic function is normal. There is normal pulmonary artery systolic pressure. The tricuspid regurgitant velocity is 2.64 m/s, and  with an assumed right atrial pressure of 3 mmHg, the estimated right ventricular systolic pressure is 30.9 mmHg. Left Atrium: Left atrial size was normal in size. Right Atrium: Right atrial size was normal in size. Pericardium: Trivial pericardial effusion is present. Presence of pericardial fat pad. Mitral Valve: The mitral valve is grossly normal. No  evidence of mitral valve regurgitation. No evidence of mitral valve stenosis. Tricuspid Valve: The tricuspid valve is grossly normal. Tricuspid valve regurgitation is trivial. No evidence of tricuspid stenosis. Aortic Valve: The aortic valve is tricuspid. Aortic valve regurgitation is not visualized. No aortic stenosis is present. Pulmonic Valve: The pulmonic valve was grossly normal. Pulmonic valve regurgitation is not visualized. No evidence of pulmonic stenosis. Aorta: The aortic root is normal in size and structure. Venous: The inferior vena cava is normal in size with greater than 50% respiratory variability, suggesting right atrial pressure of 3 mmHg. IAS/Shunts: The atrial septum is grossly normal.  LEFT VENTRICLE PLAX 2D                        Biplane EF (MOD) LVIDd:         4.90 cm         LV Biplane EF:   Left LVIDs:         3.60 cm                          ventricular LV PW:         0.90 cm                          ejection LV IVS:        1.10 cm                          fraction by LVOT diam:     1.90 cm                          2D MOD LV SV:         43                               biplane is LV SV Index:   22                               63.2 %. LVOT Area:     2.84 cm                                Diastology                                LV e' lateral:   8.93 cm/s LV Volumes (MOD)               LV E/e' lateral:  11.4 LV vol d, MOD    98.1 ml       LV e' medial:    6.08 cm/s A2C:                           LV E/e' medial:  16.8 LV vol d, MOD    88.1 ml A4C: LV vol s, MOD    53.3 ml A2C: LV vol s, MOD    23.4 ml A4C: LV SV MOD A2C:   44.8 ml LV SV MOD A4C:   88.1 ml LV SV MOD BP:    60.6 ml RIGHT VENTRICLE RV S prime:     11.40 cm/s TAPSE (M-mode): 1.9 cm LEFT ATRIUM             Index       RIGHT ATRIUM           Index LA diam:        3.90 cm 2.04 cm/m  RA Area:     13.40 cm LA Vol (A2C):   50.2 ml 26.24 ml/m RA Volume:   34.30 ml  17.93 ml/m LA Vol (A4C):   63.9 ml 33.40 ml/m LA Biplane Vol: 58.4  ml 30.53 ml/m  AORTIC VALVE LVOT Vmax:   88.30 cm/s LVOT Vmean:  53.100 cm/s LVOT VTI:    0.151 m  AORTA Ao Root diam: 3.30 cm MITRAL VALVE                TRICUSPID VALVE MV Area (PHT): 7.16 cm     TR Peak grad:   27.9 mmHg MV Decel Time: 106 msec     TR Vmax:        264.00 cm/s MV E velocity: 102.00 cm/s MV A velocity: 49.20 cm/s   SHUNTS MV E/A ratio:  2.07         Systemic VTI:  0.15 m                             Systemic Diam: 1.90 cm Lennie Odor MD Electronically signed by Lennie Odor MD Signature Date/Time: 02/24/2020/1:09:12 PM    Final    CT HEAD CODE STROKE WO CONTRAST  Result Date: 02/23/2020 CLINICAL DATA:  Code stroke. Neuro deficit, acute, stroke suspected. Mental status change at 11 a.m., last known well 8 a.m. this morning. EXAM: CT HEAD WITHOUT CONTRAST TECHNIQUE: Contiguous axial images were obtained from the base of the skull through the vertex without intravenous contrast. COMPARISON:  Brain MRI 09/17/2018 FINDINGS: Brain: There is a large acute demarcated ischemic infarct within the left MCA vascular territory affecting the left frontal, temporal and parietal lobes as well as left insula and subinsular region. The left caudate and lentiform nuclei are also affected. No evidence of hemorrhagic conversion. There is mild associated mass effect with 3 mm rightward midline shift. Redemonstrated superimposed small chronic infarcts within the left cerebral hemisphere. Background mild generalized parenchymal atrophy and chronic small vessel ischemic disease. No extra-axial fluid collection. No evidence of intracranial mass. Vascular: No hyperdense vessel Skull: Atherosclerotic calcifications. Sinuses/Orbits: Visualized orbits show no acute finding. Mild ethmoid sinus mucosal thickening. No significant mastoid effusion. ASPECTS [facility] Stroke Program Early CT Score) - Ganglionic level infarction (caudate, lentiform nuclei, internal capsule, insula, M1-M3 cortex): 2 - Supraganglionic infarction  (M4-M6 cortex): 2 Total score (0-10 with 10 being normal): 4 These results were called by telephone at the time of  interpretation on 02/23/2020 at 12:20 pm to provider Erick Blinks, who verbally acknowledged these results. IMPRESSION: Large acute demarcated left MCA vascular territory as described. ASPECTS is 4. No evidence of hemorrhagic conversion. Mild mass effect with 3 mm rightward midline shift. Stable background mild generalized parenchymal atrophy and chronic small vessel ischemic disease. Superimposed small chronic infarcts within the left cerebral hemisphere. Electronically Signed   By: Jackey Loge DO   On: 02/23/2020 12:22   CT ANGIO HEAD CODE STROKE  Result Date: 02/23/2020 CLINICAL DATA:  Neuro deficit, acute, stroke suspected. EXAM: CT ANGIOGRAPHY HEAD AND NECK CT PERFUSION BRAIN TECHNIQUE: Multidetector CT imaging of the head and neck was performed using the standard protocol during bolus administration of intravenous contrast. Multiplanar CT image reconstructions and MIPs were obtained to evaluate the vascular anatomy. Carotid stenosis measurements (when applicable) are obtained utilizing NASCET criteria, using the distal internal carotid diameter as the denominator. Multiphase CT imaging of the brain was performed following IV bolus contrast injection. Subsequent parametric perfusion maps were calculated using RAPID software. CONTRAST:  OMNIPAQUE IOHEXOL 350 MG/ML SOLN COMPARISON:  Noncontrast head CT performed earlier the same day. FINDINGS: CTA NECK FINDINGS Aortic arch: The left vertebral artery arises directly from the aortic arch. Atherosclerotic plaque within the visualized aortic arch and proximal major branch vessels of the neck. No hemodynamically significant innominate or proximal subclavian artery stenosis. Right carotid system: The CCA and ICA are patent within the neck. Atherosclerotic plaque within these vessels. Most notably there is at least moderate calcified plaque  within the carotid bifurcation. Exact quantification of stenosis within the proximal right ICA is difficult due to motion artifact and blooming artifact of calcified plaque at this site. Additionally, there is 50-60% stenosis of the mid cervical ICA. Left carotid system: The CCA is patent to the bifurcation. 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 Vertebral arteries: The dominant right vertebral artery is patent throughout the neck. Moderate/severe atherosclerotic narrowing within the V1 right vertebral artery. The non dominant left vertebral artery is developmentally diminutive, but patent throughout the neck Skeleton: Reversal of the expected cervical lordosis. Cervical spondylosis with multilevel disc space narrowing, posterior disc osteophytes and uncovertebral hypertrophy. Other neck: No neck mass or cervical lymphadenopathy. Upper chest: No consolidation within the imaged lung apices. 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 There is minimal reconstitution of flow within the supraclinoid left ICA. The M1 and M2 left middle cerebral arteries are patent, although with asymmetrically decreased opacification as compared to the right side. Additionally, there is a moderate to moderately severe focal stenosis within a superior division proximal M2 left MCA branch (series 8, image 17). The intracranial right internal carotid artery is patent. However, there are sites of severe stenosis within the cavernous and paraclinoid segments. The M1 right middle cerebral artery is patent without significant stenosis. No right M2 proximal branch occlusion is identified. There is a moderate to moderately severe stenosis within an inferior division mid M2 right MCA branch  vessel (series 9, image 19). The A1 left anterior cerebral artery is hypoplastic. The A1 right anterior cerebral artery is dominant. The A2 and more distal anterior cerebral arteries are patent. However, there are multifocal high-grade stenoses within the bilateral A2 anterior cerebral arteries. Posterior circulation: The non  dominant intracranial left vertebral artery is developmentally diminutive, but patent and terminates as the left PICA. The dominant intracranial right vertebral artery is patent, although with sites of moderate stenosis. The basilar artery is patent. The posterior cerebral arteries are patent. However, there is a high-grade focal stenosis within the P2 left PCA and high-grade focal stenosis within the right PCA at the P2/P3 junction. Venous sinuses: Within limitations of contrast timing, no convincing thrombus. Anatomic variants: Posterior communicating arteries are hypoplastic or absent bilaterally. Review of the MIP images confirms the above findings CT Brain Perfusion Findings: ASPECTS: 4 CBF (<30%) Volume: 89mL (left MCA vascular territory) Perfusion (Tmax>6.0s) volume: (left MCA vascular territory) Mismatch Volume: 60mL Infarction Location:Left MCA vascular territory. These results were called by telephone at the time of interpretation on 02/23/2020 at 12:40 pm to provider [facility] , who verbally acknowledged these results. IMPRESSION: CTA neck: 1. 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 2. There is at least moderate calcified plaque at the right carotid bifurcation. There is stenosis at the origin of the cervical right vertebral artery which is poorly quantified due to motion degradation and blooming artifact from calcified plaque at this site. Carotid artery duplex is recommended for further evaluation. Additionally, there is apparent 50-60% stenosis of the mid cervical right ICA. 3. Moderate/severe  atherosclerotic narrowing within the Dom V1 right vertebral artery. 4. The left vertebral artery is developmentally diminutive, but patent throughout the neck. CTA head: 1. The left internal carotid artery remains occluded intracranially throughout the siphon region. There is minimal reconstitution of flow within the supraclinoid left ICA. The M1 and M2 left middle cerebral arteries are patent, although with asymmetrically decreased opacification as compared to the right side. Additionally, there is a moderate to moderately severe focal stenosis within a superior division proximal M2 left MCA branch. 2. Additional intracranial atherosclerotic disease with multifocal stenoses, most notably as follows. 3. High-grade stenoses within the cavernous/paraclinoid right ICA. 4. Moderate to moderately severe focal stenosis within an inferior division mid M2 right MCA branch vessel. 5. Moderate stenosis within the V4 right vertebral artery. 6. High-grade stenosis within the P2 left PCA. 7. High-grade stenosis within the right PCA at the P2/P3 junction. 8. Multifocal high-grade stenoses within the A2 anterior cerebral arteries bilaterally. CT perfusion head: The perfusion software identifies an 89 mL core infarct within the left MCA vascular territory. The perfusion software identifies a 149 mL region of critically hypoperfused parenchyma within the left MCA vascular territory. Reported mismatch volume 60 mL. Electronically Signed   By: Jackey Loge DO   On: 02/23/2020 12:53   CT ANGIO NECK CODE STROKE  Result Date: 02/23/2020 CLINICAL DATA:  Neuro deficit, acute, stroke suspected. EXAM: CT ANGIOGRAPHY HEAD AND NECK CT PERFUSION BRAIN TECHNIQUE: Multidetector CT imaging of the head and neck was performed using the standard protocol during bolus administration of intravenous contrast. Multiplanar CT image reconstructions and MIPs were obtained to evaluate the vascular anatomy. Carotid stenosis measurements (when applicable)  are obtained utilizing NASCET criteria, using the distal internal carotid diameter as the denominator. Multiphase CT imaging of the brain was performed following IV bolus contrast injection. Subsequent parametric perfusion maps were calculated using RAPID software. CONTRAST:  OMNIPAQUE IOHEXOL 350 MG/ML SOLN COMPARISON:  Noncontrast head CT performed earlier the same day. FINDINGS: CTA NECK FINDINGS Aortic arch: The left vertebral artery arises directly from the aortic arch. Atherosclerotic plaque within the visualized aortic  arch and proximal major branch vessels of the neck. No hemodynamically significant innominate or proximal subclavian artery stenosis. Right carotid system: The CCA and ICA are patent within the neck. Atherosclerotic plaque within these vessels. Most notably there is at least moderate calcified plaque within the carotid bifurcation. Exact quantification of stenosis within the proximal right ICA is difficult due to motion artifact and blooming artifact of calcified plaque at this site. Additionally, there is 50-60% stenosis of the mid cervical ICA. Left carotid system: The CCA is patent to the bifurcation. 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 Vertebral arteries: The dominant right vertebral artery is patent throughout the neck. Moderate/severe atherosclerotic narrowing within the V1 right vertebral artery. The non dominant left vertebral artery is developmentally diminutive, but patent throughout the neck Skeleton: Reversal of the expected cervical lordosis. Cervical spondylosis with multilevel disc space narrowing, posterior disc osteophytes and uncovertebral hypertrophy. Other neck: No neck mass or cervical lymphadenopathy. Upper chest: No consolidation within the imaged lung apices. Mild atelectasis or scarring within the posterior aspect of the left upper lobe Review of the MIP images confirms the above findings CTA HEAD FINDINGS  Anterior circulation: The left internal carotid artery remains occluded throughout the siphon region. There is minimal reconstitution of flow within the supraclinoid left ICA. The M1 and M2 left middle cerebral arteries are patent, although with asymmetrically decreased opacification as compared to the right side. Additionally, there is a moderate to moderately severe focal stenosis within a superior division proximal M2 left MCA branch (series 8, image 17). The intracranial right internal carotid artery is patent. However, there are sites of severe stenosis within the cavernous and paraclinoid segments. The M1 right middle cerebral artery is patent without significant stenosis. No right M2 proximal branch occlusion is identified. There is a moderate to moderately severe stenosis within an inferior division mid M2 right MCA branch vessel (series 9, image 19). The A1 left anterior cerebral artery is hypoplastic. The A1 right anterior cerebral artery is dominant. The A2 and more distal anterior cerebral arteries are patent. However, there are multifocal high-grade stenoses within the bilateral A2 anterior cerebral arteries. Posterior circulation: The non dominant intracranial left vertebral artery is developmentally diminutive, but patent and terminates as the left PICA. The dominant intracranial right vertebral artery is patent, although with sites of moderate stenosis. The basilar artery is patent. The posterior cerebral arteries are patent. However, there is a high-grade focal stenosis within the P2 left PCA and high-grade focal stenosis within the right PCA at the P2/P3 junction. Venous sinuses: Within limitations of contrast timing, no convincing thrombus. Anatomic variants: Posterior communicating arteries are hypoplastic or absent bilaterally. Review of the MIP images confirms the above findings CT Brain Perfusion Findings: ASPECTS: 4 CBF (<30%) Volume: 89mL (left MCA vascular territory) Perfusion (Tmax>6.0s)  volume: (left MCA vascular territory) Mismatch Volume: 60mL Infarction Location:Left MCA vascular territory. These results were called by telephone at the time of interpretation on 02/23/2020 at 12:40 pm to provider [facility] , who verbally acknowledged these results. IMPRESSION: CTA neck: 1. 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 2. There is at least moderate calcified plaque at the right carotid bifurcation. There is stenosis at the origin of the cervical right vertebral artery which is poorly quantified due to motion degradation and blooming artifact from calcified plaque at this site. Carotid artery duplex is recommended for  further evaluation. Additionally, there is apparent 50-60% stenosis of the mid cervical right ICA. 3. Moderate/severe atherosclerotic narrowing within the Dom V1 right vertebral artery. 4. The left vertebral artery is developmentally diminutive, but patent throughout the neck. CTA head: 1. The left internal carotid artery remains occluded intracranially throughout the siphon region. There is minimal reconstitution of flow within the supraclinoid left ICA. The M1 and M2 left middle cerebral arteries are patent, although with asymmetrically decreased opacification as compared to the right side. Additionally, there is a moderate to moderately severe focal stenosis within a superior division proximal M2 left MCA branch. 2. Additional intracranial atherosclerotic disease with multifocal stenoses, most notably as follows. 3. High-grade stenoses within the cavernous/paraclinoid right ICA. 4. Moderate to moderately severe focal stenosis within an inferior division mid M2 right MCA branch vessel. 5. Moderate stenosis within the V4 right vertebral artery. 6. High-grade stenosis within the P2 left PCA. 7. High-grade stenosis within the right PCA at the P2/P3 junction. 8. Multifocal high-grade stenoses within the A2  anterior cerebral arteries bilaterally. CT perfusion head: The perfusion software identifies an 89 mL core infarct within the left MCA vascular territory. The perfusion software identifies a 149 mL region of critically hypoperfused parenchyma within the left MCA vascular territory. Reported mismatch volume 60 mL. Electronically Signed   By: Jackey Loge DO   On: 02/23/2020 12:53    PHYSICAL EXAM  Temp:  [97.5 F (36.4 C)-100.3 F (37.9 C)] 98.2 F (36.8 C) (08/14 0427) Pulse Rate:  [65-76] 68 (08/14 0427) Resp:  [17-26] 18 (08/14 0427) BP: (142-169)/(69-85) 144/72 (08/14 0427) SpO2:  [90 %-98 %] 93 % (08/14 0427)  General - Well nourished, well developed, in no apparent distress. Gurgly sound in the throat, persistent  Ophthalmologic - fundi not visualized due to noncooperation.  Cardiovascular - regular rate and rhythm today, not in afib.  Neuro - awake alert,.  Global aphasia with word salad. Able to close eyes on command but then perseverated on it. Not able to follow peripheral commands, but able to pantomime. Not able to name but able to repeat 1-2 words. No gaze palsy, able to track bilaterally. Not cooperative on visual field testing, but seems to have full visual field. Right facial droop. Tongue midline in mouth. LUE 5/5, RUE flaccid. LLE at least 4/5 and RLE 2-/5 proximally. Not moving toes bilaterally. Sensation, coordination not cooperative and gait not tested.   ASSESSMENT/PLAN Jonathan [name] is a 59 y.o. male with history of hypertension, hyperlipidemia, smoker, stroke in 09/2019 admitted for bilateral acute limb ischemia status post stenting with reocclusion status post thrombectomy and endarterectomy as well as left leg fasciotomy.  Developed aphasia and right hemiplegia.  CT showed left large MCA infarct.  Transferred to [facility] for further evaluation. No tPA given due to on argatroban and established stroke.    Stroke:  left large MCA infarct due to left ICA occlusion  most likely due to chronic left carotid artery stenosis/occlusion in the setting of operation (possible hypotension)    CT head left MCA large infarct  CTA head and neck left ICA occlusion, right ICA bulb moderate stenosis with mid cervical right ICA 50 to 60% stenosis.  High-grade stenosis right ICA siphon.  Bilateral M2 moderate to severe focal stenosis.  Right V1 and V4 moderate to severe stenosis.  High-grade stenosis bilateral P2 and A2.  Carotid Doppler left ICA occluded at origin, right ICA less than 50% stenosis.  Repeat CT left MCA infarct increased  size and surrounding edema  CT 8/11 stable large left MCA infarct with MLS 4-37mm  Repeat CT 8/13 stable large left MCA infarct with mildly improved midline shift  2D Echo EF 60 to 65%  LDL 71  HgbA1c 6.2  lovenox for VTE prophylaxis   Argatroban prior to admission, now on aspirin 81 mg daily and clopidogrel 75 mg daily. May consider Eliquis 10-14 days post stroke given large size of infarct  Patient counseled to be compliant with his antithrombotic medications  Ongoing aggressive stroke risk factor management  Therapy recommendations: Pending  Disposition: Pending  Cerebral edema  CT showed large left MCA infarct with mild midline shift  On 3% saline -> off now -> 1/2 NS @ 50 -> off  Sodium goal 150-155  Na 143->152->153->154->160->150  CT 8/11 showed slight worsening midline shift 4-42mm  CT 8/13 stable right large MCA infarct with mildly improved midline shift  Allow sodium gradually trending down  PAF  PAF on EKGs  Trop 2259->2260->3089  No AC at this time due to large left MCA infarcts - Continue DAPT for now  May consider Eliquis 7 to 10 days post stroke given large size of infarct  HIT ruled out  Platelet 170-148-126-117->212-> 229  Was on argatroban, now off  HIT panel neg  On lovenox for DVT prophylaxis   Bilateral ischemic lower limb  S/p bilateral iliac artery stents  Bilateral  stent reocclusion -> bilateral thrombectomy and endarterectomy + as well as left LE fasciotomy  VVS on board  On DAPT  History of stroke  09/2019 presented with slurred speech and incoherent speech.  MRI showed left MCA scattered infarcts.  No MRI or CTA performed.  However from MRI T2 imaging concerning for left ICA stenosis  Patient otherwise for admission at that time for further stroke work-up however patient refused and signed AMA.  Home medication including aspirin 325, Plavix 75 and Lipitor 80, not sure compliance  Hypertension . Stable at goal . Resumed home lisinopril 20 and amlodipine 10 . CCM also added metoprolol 50 tid -> 75 bid  Long term BP goal 130-150 given left ICA occlusion  Hyperlipidemia  Home meds: Lipitor 80  LDL 71, goal < 70  Now on Lipitor 80  Continue statin at discharge  Transaminitis  AST/ALT 203/46 -> 163/56-> 96/75  Continue to monitor  Okay to continue Lipitor at this time  Acute blood loss anemia  Hemoglobin 12.0-9.3-7.2-7.4-9.4->11.5->10.4->10.1->11.0  Close monitoring  Transfuse PRBC if hemoglobin less than 7  Dysphagia Aspiration   Passed swallow  Now on dysphagia 1 diet and thin liquid  Speech on board  Aspiration precaution  Chest PT  3% neb  NT suction PRN  Tobacco abuse  Current smoker  Smoking cessation counseling provided  Nicotine patch provided  Pt is willing to quit  Other Stroke Risk Factors  Multifocal severe vasculopathy  Other Active Problems  Hypokalemia K 3.3->3.9->3.2->2.7->3.4->3.5  Fever T-max 100.6-> afebrile->100.3  Hospital day # 6  Continue aspirin 81 mg and Plavix 25 mg daily for 10 days since his stroke and then switch to Eliquis for h/o PAF as  now the patient is able to swallow.  Mobilize out of bed.  Continue ongoing therapies.  Transfer to inpatient rehab over the next few days when bed available.  Stroke team will sign off.  Kindly call for questions.  Discussed with  Dr. Pola Corn.  Greater than 50% time during this 25-minute visit was spent on counseling and coordination of care about  his stroke and carotid occlusion and answered questions. Delia Heady, MD   To contact Stroke Continuity provider, please refer to [email or web address]. After hours, contact General Neurology

## 2020-02-29 NOTE — Progress Notes (Signed)
[facility]  Subjective  - feels ok trouble with speech still   Objective (!) 141/77 73 98 F (36.7 C) 18 96%  Intake/Output Summary (Last 24 hours) at 02/29/2020 1002 Last data filed at 02/29/2020 0436 Gross per 24 hour  Intake 1080 ml  Output 850 ml  Net 230 ml   Groin incisions clean 2+ DP pulses Right hemiplegia  Assessment/Planning: Well perfused extremities Complete hemi unchanged from stroke Continue current care Continue ASA plavix statin  Fabienne Bruns 02/29/2020 10:02 AM --  Laboratory Lab Results: Recent Labs    02/28/20 0437 02/29/20 0305  WBC 12.1* 11.3*  HGB 11.0* 10.5*  HCT 36.1* 32.7*  PLT 229 274   BMET Recent Labs    02/28/20 1332 02/29/20 0305  NA 150* 143  K 3.5 3.0*  CL 113* 107  CO2 27 27  GLUCOSE 134* 135*  BUN 21* 18  CREATININE 0.69 0.68  CALCIUM 8.9 8.6*    COAG Lab Results  Component Value Date   INR 1.1 02/22/2020   INR 0.9 09/17/2019   No results found for: PTT

## 2020-02-29 NOTE — Progress Notes (Signed)
Occupational Therapy Treatment Patient Details Name: Jonathan [name] MRN: [id number] DOB: 06-19-1961 Today's Date: 02/29/2020    History of present illness Pt is 59 y/o M with PMH: DDD, HTN, HLD, h/o CVA with some acute aphasia (wife reports his speech was normal before this admission), and PAD s/p stent in 2017. Presented with L LE pain. Now s/p angio with PCI and stent placement on 8/6 and revascularization of L LE on 8/7. Extubated 02/23/20. Of note, during OT assessment, significant R sided weakness and significant aphasia present. OT notified RN immediately who called code stroke.   OT comments  Pt seen for check of scrotal sling and to work on bed mobility. With mod-max multimodal cues pt able to utilize L side to facilitate rolling to R side and to scoot up in bed. Expressive deficits and possibly receptive (vs cognition?) communication deficits noted. Pt also with some R side neglect and motor perseveration. Pt attempting, but struggling, to verbally communicate. Friendly demeanor though appropriately frustrated at times. D/c plan remains appropriate.    Follow Up Recommendations  CIR    Equipment Recommendations  Other (comment) (defer to next venue of care)    Recommendations for Other Services      Precautions / Restrictions Precautions Precautions: Fall Precaution Comments: scrotal sling provided on 8/13 Restrictions Weight Bearing Restrictions: No       Mobility Bed Mobility Overal bed mobility: Needs Assistance Bed Mobility: Rolling Rolling: Mod assist            Transfers                      Balance                                           ADL either performed or assessed with clinical judgement   ADL Overall ADL's : Needs assistance/impaired                                       General ADL Comments: Pt completed bed mobility tasks of rolling to R side and scooting up in bed. With multimodal cues, pt able  to utilize LEs to assist with bed mobility. Scrotal sling checked and no issues found. Positioned LUE onto pillow for edema control and kept out of blankets to promote attention to RUE. Also provided rolled up washcloth in R hand for positoning.      Vision       Perception     Praxis      Cognition Arousal/Alertness: Awake/alert Behavior During Therapy: WFL for tasks assessed/performed Overall Cognitive Status: Difficult to assess Area of Impairment: Attention;Memory;Following commands;Safety/judgement;Awareness;Problem solving                   Current Attention Level: Sustained Memory: Decreased recall of precautions Following Commands: Follows one step commands inconsistently Safety/Judgement: Decreased awareness of safety;Decreased awareness of deficits Awareness: Intellectual Problem Solving: Difficulty sequencing;Requires verbal cues;Requires tactile cues General Comments: difficultly following one step commands with multimodal cueing (placing L foot on bed to assist with power over to sidelying position. Cued to shrug L shoulder and pt completing RUE raises instead. Cognitive vs receptive deficits?        Exercises     Shoulder Instructions  General Comments Some R side neglect and motor perseveration noted.  Nursing present and assessing pt during OT session. Discussed with nursing positioning and concerns for increased risk of skin break down. Hoyer for OOB currently    Pertinent Vitals/ Pain       Pain Assessment: Faces Faces Pain Scale: Hurts even more Pain Location: scrotum Pain Descriptors / Indicators: Guarding;Grimacing Pain Intervention(s): Monitored during session;Limited activity within patient's tolerance  Home Living                                          Prior Functioning/Environment              Frequency  Min 2X/week        Progress Toward Goals  OT Goals(current goals can now be found in the care  plan section)  Progress towards OT goals: Progressing toward goals  Acute Rehab OT Goals Patient Stated Goal: to eat OT Goal Formulation: With patient Time For Goal Achievement: 03/12/20 Potential to Achieve Goals: Good ADL Goals Pt Will Perform Eating: with set-up;with supervision;sitting Pt Will Perform Grooming: with set-up;with supervision;sitting Pt Will Perform Upper Body Bathing: with min assist;sitting Pt Will Perform Lower Body Bathing: with mod assist;sit to/from stand Pt Will Transfer to Toilet: with mod assist;stand pivot transfer;bedside commode Pt Will Perform Toileting - Clothing Manipulation and hygiene: with mod assist;sit to/from stand Additional ADL Goal #1: Pt will locate ADL items on his Rt with no more than min cues Additional ADL Goal #2: Pt will contribute to R UE PROM with L UE as able/tolerable to maintain joint integrity.  Plan Discharge plan remains appropriate    Co-evaluation                 AM-PAC OT "6 Clicks" Daily Activity     Outcome Measure   Help from another person eating meals?: A Little Help from another person taking care of personal grooming?: A Lot Help from another person toileting, which includes using toliet, bedpan, or urinal?: Total Help from another person bathing (including washing, rinsing, drying)?: A Lot Help from another person to put on and taking off regular upper body clothing?: A Lot Help from another person to put on and taking off regular lower body clothing?: Total 6 Click Score: 11    End of Session    OT Visit Diagnosis: Unsteadiness on feet (R26.81);Cognitive communication deficit (R41.841);Hemiplegia and hemiparesis Symptoms and signs involving cognitive functions: Cerebral infarction Hemiplegia - Right/Left: Right Hemiplegia - dominant/non-dominant: Dominant Hemiplegia - caused by: Cerebral infarction   Activity Tolerance Patient tolerated treatment well   Patient Left in bed;with call bell/phone  within reach;with bed alarm set (nurse in room to assess pt.)   Nurse Communication          Time: 1039-1100 OT Time Calculation (min): 21 min  Charges: OT General Charges $OT Visit: 1 Visit OT Treatments $Self Care/Home Management : 8-22 mins  Jonathan [name], OT Acute Rehabilitation Services Pager: [phone] Office: [phone]    Pilar Grammes 02/29/2020, 1:46 PM

## 2020-02-29 NOTE — Progress Notes (Signed)
PROGRESS NOTE  Jonathan [name]  DOB: 10/09/1960  PCP: Dione Housekeeper, MD BLT:[id number]  DOA: 02/23/2020  LOS: 6 days   No chief complaint on file.  Brief narrative: Jonathan [name] is a 59 y.o. male with PMH of HTN, HLD, chronic smoking, significant peripheral artery disease.  8/6, patient went to see his PCP for progressively worsening pain of his left leg for a month.  He was suspected to have acute limb ischemia and was urgently referred to vascular surgery. Vascular surgery confirmed an acute ischemic left lower extremity.  Patient underwent an emergency angiogram and stent placement in bilateral common iliac arteries and external iliac arteries.  8/7, he was found to have acute thrombosis of bilateral iliac stents for which which he underwentbilateral thrombectomy, left popliteal exposure and thrombectomy, left femoral endarterectomy and patch angioplasty as well as left fasciotomy.Argatroban was started for concerning of heparin-induced thrombocytopenia.  8/8, patient developed slurred speech, right-sided weakness. CT head showed large left MCA stroke.  Due to the large size of his infarct, there was a concern that he may develop malignant cerebral edema.  Therefore he was transferred to [facility] for close neuro ICU monitoring.  Was is transferred from [facility] to Stroke work-up done. Because of the risk of hemorrhagic conversion argatroban drip was stopped.   Stroke work-up was completed. Patient was transferred to hospital service on 8/13.  Subjective: Patient was seen and examined this afternoon. Propped up in bed.  Not in distress.  Has significant expressive aphasia which makes him frustrated. Chart reviewed. No fever in last 24 hours.  Heart rate in 60s and 70s.  Blood pressure in 140s, oxygen saturation maintained on room air. Labs this morning with sodium level improved to 143, potassium level low at 3, creatinine normal at 0.68, WBC count improving at 11.3,  hemoglobin stable at 10.5, platelet count normal at 274.  Assessment/Plan: Left MCA stroke Left ICA occlusion -CT head showed large left MCA stroke -CTA head and neck and carotid duplex showed left ICA occlusion at origin, right ICA less than 50% stenosis. -Repeat CT 8/13 showed stable large left MCA infarct withmildly improved midline shift -2D Echo EF 60 to 65% -LDL 71 -HgbA1c 6.2 -Patient currently on aspirin 325 g daily and Plavix 75 mg daily. -Per neurology May consider Eliquis 10 days post stroke given large size of infarct.  Cerebral edema Hypernatremia After the initial CT showing large left MCA infarct with mild midline shift, patient was started on 3% normal saline.  Currently off.   -Sodium level was elevated because of 3% saline, peaked at 160, improving.  Back to normal now. Recent Labs  Lab 02/26/20 0825 02/26/20 1520 02/26/20 1918 02/27/20 0149 02/27/20 0734 02/27/20 1311 02/27/20 2020 02/28/20 0801 02/28/20 1332 02/29/20 0305  NA 154*  156* 159* 157* 160* 160* 156* 154* 151* 150* 143   Acute lower leg ischemia status post angioplasty and stent placement -Patient underwent thrombectomy and endarterectomy with fasciotomy.  -Currently on aspirin 325 mg daily. -We will also add Eliquis to start from 8/16.   Ruled out HIT -Post stenting, platelets dropped.  There was a concern for heparin-induced thrombocytopenia. HIT panel was negative -Currently on Lovenox for DVT prophylaxis.    New onset A. fib with RVR -Initially required Cardizem drip.  Currently rate controlled on metoprolol 75 mg twice daily.   -Echo showed a normal EF of 60-65% with normal right and left ventricular function. -He is not a candidate for anticoagulation due  to risk of hemorrhagic conversion. May consider anticoagulation 2 weeks post stroke per neurology  Elevated troponin Suspect demand ischemia Troponin elevated at 2259 - 2260 - 2227. Last Echo shows normal EF with no abnormal  wall motion. EKG did not show any evidence of ischemia. This maybe secondary to his Afib.Repeat EKG shows normal sinus rhythm with no obvious sign of ischemia.  Likely demand ischemia secondary to A. fib or muscle injury.  Dysphagia -Secondary to stroke.  Not aspiration events in the hospital -Not on antibiotics. -Speech therapy following. Currently on pured diet.  Essential hypertension -Currently on metoprolol 75 mg daily lisinopril 20 mg daily and amlodipine 10 mg daily. -Long term BP goal 130-150 given left ICA occlusion -Continue to monitor.  Hyperlipidemia -On Lipitor 80 mg daily.  Transaminitis -Improving trend.  Continue to monitor. -Okay to continue Lipitor at this time Recent Labs  Lab 02/23/20 1741 02/25/20 0424 02/27/20 0149  AST 203* 163* 96*  ALT 46* 56* 75*  ALKPHOS 34* 42 37*  BILITOT 0.6 1.1 0.8  PROT 4.6* 6.4* 5.6*  ALBUMIN 2.3* 2.8* 2.5*   Acute blood loss anemia -Improving.  Continue to monitor -Transfuse PRBC if hemoglobin less than 7 Recent Labs    02/22/20 1137 02/23/20 0124 02/23/20 0401 02/23/20 1741 02/24/20 1025 02/25/20 0424 02/26/20 0825 02/27/20 0149 02/28/20 0437 02/29/20 0305  HGB 9.3* 7.3* 7.2* 7.4* 9.4* 11.5* 10.4* 10.1* 11.0* 10.5*   Tobacco abuse -Counseled to quit.  Hypokalemia -Potassium low at 3 today.  Oral replacement given.  Mobility: PT recommended CIR Code Status:   Code Status: Full Code  Nutritional status: There is no height or weight on file to calculate BMI.     Diet Order            DIET - DYS 1 Room service appropriate? No; Fluid consistency: Thin  Diet effective now                 DVT prophylaxis: enoxaparin (LOVENOX) injection 40 mg Start: 02/25/20 1515 Foot Pump / plexipulse Start: 02/23/20 1742   Antimicrobials:  None Fluid: None  Consultants: Neurology, vascular surgery Family Communication:  None at bedside  Status is: Inpatient  Remains inpatient appropriate  because:Ongoing diagnostic testing needed not appropriate for outpatient work up and Unsafe d/c plan   Dispo: The patient is from: Home              Anticipated d/c is to: CIR              Anticipated d/c date is: 3 days              Patient currently is not medically stable to d/c.       Infusions:  . sodium chloride Stopped (02/25/20 1233)    Scheduled Meds: . amLODipine  10 mg Oral Daily  . aspirin  300 mg Rectal Daily   Or  . aspirin  325 mg Oral Daily  . atorvastatin  80 mg Oral Daily  . chlorhexidine  15 mL Mouth Rinse BID  . Chlorhexidine Gluconate Cloth  6 each Topical Daily  . clopidogrel  75 mg Oral Daily  . enoxaparin (LOVENOX) injection  40 mg Subcutaneous Q24H  . folic acid  1 mg Oral Daily  . guaiFENesin  600 mg Oral BID  . lisinopril  40 mg Oral Daily  . mouth rinse  15 mL Mouth Rinse q12n4p  . metoprolol tartrate  75 mg Oral BID  . nicotine  21  mg Transdermal Q2000  . pantoprazole  40 mg Oral Q2000  . sodium chloride HYPERTONIC  4 mL Nebulization Q4H WA  . thiamine  100 mg Oral Daily    Antimicrobials: Anti-infectives (From admission, onward)   None      PRN meds: sodium chloride, acetaminophen, albuterol, docusate sodium, ipratropium-albuterol, labetalol, ondansetron (ZOFRAN) IV, polyethylene glycol   Objective: Vitals:   02/29/20 0759 02/29/20 0821  BP: (!) 141/77   Pulse: 73   Resp: 18   Temp: 98 F (36.7 C)   SpO2: 96% 96%    Intake/Output Summary (Last 24 hours) at 02/29/2020 0852 Last data filed at 02/29/2020 0436 Gross per 24 hour  Intake 1080 ml  Output 1200 ml  Net -120 ml   There were no vitals filed for this visit. Weight change:  There is no height or weight on file to calculate BMI.   Physical Exam: General exam: Appears calm and comfortable.  Not in physical distress Skin: No rashes, lesions or ulcers. HEENT: Atraumatic, normocephalic, supple neck, no obvious bleeding Lungs: Clear to auscultation bilaterally CVS:  Regular rate and rhythm, no murmur GI/Abd soft, nontender, nondistended, bowel sound present CNS: Alert, awake, has significant expressive aphasia.  Able to follow motor commands to move left extremities but not the right Psychiatry: Frustrated Extremities: No pedal edema, no calf tenderness  Data Review: I have personally reviewed the laboratory data and studies available.  Recent Labs  Lab 02/25/20 0424 02/26/20 0825 02/27/20 0149 02/28/20 0437 02/29/20 0305  WBC 11.9* 9.0 9.5 12.1* 11.3*  HGB 11.5* 10.4* 10.1* 11.0* 10.5*  HCT 34.8* 32.9* 32.1* 36.1* 32.7*  MCV 94.8 97.6 99.1 100.6* 96.7  PLT 160 187 212 229 274   Recent Labs  Lab 02/23/20 1741 02/23/20 2113 02/25/20 0424 02/25/20 0630 02/26/20 0825 02/26/20 1520 02/27/20 0149 02/27/20 0734 02/27/20 1311 02/27/20 2020 02/28/20 0801 02/28/20 1332 02/29/20 0305  NA 139   < > 152*   < > 154*  156*   < > 160*   < > 156* 154* 151* 150* 143  K 3.3*   < > 3.2*  --  2.7*  --  3.4*  --   --   --   --  3.5 3.0*  CL 105   < > 116*  --  118*  --  124*  --   --   --   --  113* 107  CO2 24   < > 22  --  25  --  27  --   --   --   --  27 27  GLUCOSE 159*   < > 155*  --  253*  --  150*  --   --   --   --  134* 135*  BUN 11   < > 10  --  15  --  19  --   --   --   --  21* 18  CREATININE 0.68   < > 0.72  --  0.75  --  0.76  --   --   --   --  0.69 0.68  CALCIUM 7.9*   < > 8.7*  --  8.7*  --  8.8*  --   --   --   --  8.9 8.6*  MG 1.9  --  2.2  --   --   --   --   --   --   --   --   --   --  PHOS 1.9*  --   --   --   --   --   --   --   --   --   --   --   --    < > = values in this interval not displayed.   Lab Results  Component Value Date   HGBA1C 6.2 (H) 02/23/2020       Component Value Date/Time   CHOL 132 02/24/2020 0748   TRIG 129 02/24/2020 0748   HDL 35 (L) 02/24/2020 0748   CHOLHDL 3.8 02/24/2020 0748   VLDL 26 02/24/2020 0748   LDLCALC 71 02/24/2020 0748    Signed, Lorin Glass, MD [facility] Pager: [phone] (Secure Chat preferred). 02/29/2020

## 2020-03-01 LAB — MAGNESIUM: Magnesium: 2.1 mg/dL (ref 1.7–2.4)

## 2020-03-01 LAB — CBC WITH DIFFERENTIAL/PLATELET
Abs Immature Granulocytes: 0.06 10*3/uL (ref 0.00–0.07)
Basophils Absolute: 0 10*3/uL (ref 0.0–0.1)
Basophils Relative: 0 %
Eosinophils Absolute: 0.1 10*3/uL (ref 0.0–0.5)
Eosinophils Relative: 1 %
HCT: 33 % — ABNORMAL LOW (ref 39.0–52.0)
Hemoglobin: 10.9 g/dL — ABNORMAL LOW (ref 13.0–17.0)
Immature Granulocytes: 1 %
Lymphocytes Relative: 13 %
Lymphs Abs: 1.5 10*3/uL (ref 0.7–4.0)
MCH: 31.2 pg (ref 26.0–34.0)
MCHC: 33 g/dL (ref 30.0–36.0)
MCV: 94.6 fL (ref 80.0–100.0)
Monocytes Absolute: 1 10*3/uL (ref 0.1–1.0)
Monocytes Relative: 8 %
Neutro Abs: 8.7 10*3/uL — ABNORMAL HIGH (ref 1.7–7.7)
Neutrophils Relative %: 77 %
Platelets: 317 10*3/uL (ref 150–400)
RBC: 3.49 MIL/uL — ABNORMAL LOW (ref 4.22–5.81)
RDW: 12.9 % (ref 11.5–15.5)
WBC: 11.3 10*3/uL — ABNORMAL HIGH (ref 4.0–10.5)
nRBC: 0 % (ref 0.0–0.2)

## 2020-03-01 LAB — COMPREHENSIVE METABOLIC PANEL
ALT: 62 U/L — ABNORMAL HIGH (ref 0–44)
AST: 35 U/L (ref 15–41)
Albumin: 2.1 g/dL — ABNORMAL LOW (ref 3.5–5.0)
Alkaline Phosphatase: 43 U/L (ref 38–126)
Anion gap: 9 (ref 5–15)
BUN: 13 mg/dL (ref 6–20)
CO2: 26 mmol/L (ref 22–32)
Calcium: 8.5 mg/dL — ABNORMAL LOW (ref 8.9–10.3)
Chloride: 106 mmol/L (ref 98–111)
Creatinine, Ser: 0.68 mg/dL (ref 0.61–1.24)
GFR calc Af Amer: >60 mL/min (ref >60)
GFR calc non Af Amer: >60 mL/min (ref >60)
Glucose, Bld: 131 mg/dL — ABNORMAL HIGH (ref 70–99)
Potassium: 3.1 mmol/L — ABNORMAL LOW (ref 3.5–5.1)
Sodium: 141 mmol/L (ref 135–145)
Total Bilirubin: 0.9 mg/dL (ref 0.3–1.2)
Total Protein: 5.4 g/dL — ABNORMAL LOW (ref 6.5–8.1)

## 2020-03-01 LAB — PHOSPHORUS: Phosphorus: 3.5 mg/dL (ref 2.5–4.6)

## 2020-03-01 MED ORDER — APIXABAN 5 MG PO TABS
5.0000 mg | ORAL_TABLET | Freq: Two times a day (BID) | ORAL | Status: DC
  Administered 2020-03-02 – 2020-03-10 (×17): 5 mg via ORAL
  Filled 2020-03-01 (×17): qty 1

## 2020-03-01 MED ORDER — ENOXAPARIN SODIUM 40 MG/0.4ML ~~LOC~~ SOLN
40.0000 mg | SUBCUTANEOUS | Status: AC
  Administered 2020-03-01: 40 mg via SUBCUTANEOUS
  Filled 2020-03-01: qty 0.4

## 2020-03-01 MED ORDER — POTASSIUM CHLORIDE CRYS ER 20 MEQ PO TBCR
40.0000 meq | EXTENDED_RELEASE_TABLET | Freq: Every day | ORAL | Status: DC
  Administered 2020-03-01 – 2020-03-10 (×10): 40 meq via ORAL
  Filled 2020-03-01 (×10): qty 2

## 2020-03-01 NOTE — Progress Notes (Signed)
CPT not performed at this time. Patient is sleeping.

## 2020-03-01 NOTE — Progress Notes (Signed)
[facility]   Subjective  - no change   Objective (!) 155/69 71 97.7 F (36.5 C) (Oral) 18 97%  Intake/Output Summary (Last 24 hours) at 03/01/2020 0910 Last data filed at 03/01/2020 0500 Gross per 24 hour  Intake 240 ml  Output 2400 ml  Net -2160 ml   Right hemiplegia with dysarthria 2+ DP pulses left groin incision and leg incisions healing VAC on right groin  Assessment/Planning: Stable from vascular standpoint Dr Myra Gianotti to see tomorrow to determine length of VAC therapy  Fabienne Bruns 03/01/2020 9:10 AM --  Laboratory Lab Results: Recent Labs    02/29/20 0305 03/01/20 0803  WBC 11.3* 11.3*  HGB 10.5* 10.9*  HCT 32.7* 33.0*  PLT 274 317   BMET Recent Labs    02/29/20 0305 03/01/20 0803  NA 143 141  K 3.0* 3.1*  CL 107 106  CO2 27 26  GLUCOSE 135* 131*  BUN 18 13  CREATININE 0.68 0.68  CALCIUM 8.6* 8.5*    COAG Lab Results  Component Value Date   INR 1.1 02/22/2020   INR 0.9 09/17/2019   No results found for: PTT

## 2020-03-01 NOTE — Discharge Instructions (Signed)
Information on my medicine - ELIQUIS (apixaban)  This medication education was reviewed with me or my healthcare representative as part of my discharge preparation.    Why was Eliquis prescribed for you? Eliquis was prescribed for you to reduce the risk of forming blood clots that can cause a stroke if you have a medical condition called atrial fibrillation (a type of irregular heartbeat) What do You need to know about Eliquis ? Take your Eliquis TWICE DAILY - one tablet in the morning and one tablet in the evening with or without food.  It would be best to take the doses about the same time each day.  If you have difficulty swallowing the tablet whole please discuss with your pharmacist how to take the medication safely.  Take Eliquis exactly as prescribed by your doctor and DO NOT stop taking Eliquis without talking to the doctor who prescribed the medication.  Stopping may increase your risk of developing a new clot or stroke.  Refill your prescription before you run out.  After discharge, you should have regular check-up appointments with your healthcare provider that is prescribing your Eliquis.  In the future your dose may need to be changed if your kidney function or weight changes by a significant amount or as you get older.  What do you do if you miss a dose? If you miss a dose, take it as soon as you remember on the same day and resume taking twice daily.  Do not take more than one dose of ELIQUIS at the same time.  Important Safety Information A possible side effect of Eliquis is bleeding. You should call your healthcare provider right away if you experience any of the following: ? Bleeding from an injury or your nose that does not stop. ? Unusual colored urine (red or dark brown) or unusual colored stools (red or black). ? Unusual bruising for unknown reasons. ? A serious fall or if you hit your head (even if there is no bleeding).  Some medicines may interact with  Eliquis and might increase your risk of bleeding or clotting while on Eliquis. To help avoid this, consult your healthcare provider or pharmacist prior to using any new prescription or non-prescription medications, including herbals, vitamins, non-steroidal anti-inflammatory drugs (NSAIDs) and supplements.  This website has more information on Eliquis (apixaban): www.Eliquis.com.    

## 2020-03-01 NOTE — Progress Notes (Signed)
PROGRESS NOTE  Jonathan SoursRaymond C [name]  DOB: 1960/09/02  PCP: Dione Housekeeperlmedo, Mario Ernesto, MD ZOX:[id number]  DOA: 02/23/2020  LOS: 7 days   Chief complaint: Acute limb ischemia  Brief narrative: Jonathan SoursRaymond C Danko is a 59 y.o. male with PMH of HTN, HLD, chronic smoking, significant peripheral artery disease.  8/6, patient went to see his PCP for progressively worsening pain of his left leg for a month.  He was suspected to have acute limb ischemia and was urgently referred to vascular surgery. Vascular surgery confirmed an acute ischemic left lower extremity.  Patient underwent an emergency angiogram and stent placement in bilateral common iliac arteries and external iliac arteries.  8/7, he was found to have acute thrombosis of bilateral iliac stents for which which he underwentbilateral thrombectomy, left popliteal exposure and thrombectomy, left femoral endarterectomy and patch angioplasty as well as left fasciotomy.Argatroban was started for concerning of heparin-induced thrombocytopenia.  8/8, patient developed slurred speech, right-sided weakness. CT head showed large left MCA stroke.  Due to the large size of his infarct, there was a concern that he may develop malignant cerebral edema.  Therefore [facility].  [facility]roke work-up done. Because of the risk of hemorrhagic conversion argatroban drip was stopped.   Stroke work-up was completed. Patient was transferred to hospital service on 8/13.  Subjective: Patient was seen and examined this morning. Propped up in bed.  Not in distress.  Talking to family on phone.  Has significant expressive aphasia which obviously is making him frustrated. Chart reviewed. No fever last 24 hours. Blood pressure in 140s and 160s, heart rate in 60s. Labs from this morning showed sodium level normal at 141, potassium low at 3.1, normal magnesium and phosphorus.  WBC count only  slightly elevated at 11.3, hemoglobin stable at 10.9  Assessment/Plan: Left MCA stroke Left ICA occlusion -CT head showed large left MCA stroke -CTA head and neck and carotid duplex showed left ICA occlusion at origin, right ICA less than 50% stenosis. -Repeat CT 8/13 showed stable large left MCA infarct withmildly improved midline shift -2D Echo EF 60 to 65% -LDL 71 -HgbA1c 6.2 -Patient currently on aspirin 325 g daily and Plavix 75 mg daily. -Per neurology may consider Eliquis  and an antiplatelet 10 days post stroke given large size of infarct.  From tomorrow, patient will be on Eliquis 5 mg daily and Plavix 75 mg daily.  Cerebral edema Hypernatremia -resolved After the initial CT showed large left MCA infarct with mild midline shift, patient was started on 3% normal saline.  Currently off saline. -Sodium level was elevated because of 3% saline, peaked at 160, improving.  Back to normal now. Recent Labs  Lab 02/26/20 1520 02/26/20 1918 02/27/20 0149 02/27/20 0734 02/27/20 1311 02/27/20 2020 02/28/20 0801 02/28/20 1332 02/29/20 0305 03/01/20 0803  NA 159* 157* 160* 160* 156* 154* 151* 150* 143 141   Acute lower leg ischemia status post angioplasty and stent placement -Patient underwent thrombectomy and endarterectomy with fasciotomy.  -Currently on aspirin 325 mg daily and Plavix 75 mg daily -From tomorrow, patient will be on Eliquis 5 mg daily and Plavix 75 mg daily.  I have informed vascular surgeon about it  Ruled out HIT -Post stenting, platelets dropped.  There was a concern for heparin-induced thrombocytopenia. HIT panel was negative -Currently on Lovenox for DVT prophylaxis.   -Plan to switch to Eliquis from tomorrow.  New onset A. fib  with RVR -Initially required Cardizem drip.  Currently rate controlled on metoprolol 75 mg twice daily.   -Echo showed a normal EF of 60-65% with normal right and left ventricular function. -He is not a candidate for  anticoagulation due to risk of hemorrhagic conversion. May consider anticoagulation 2 weeks post stroke per neurology  Elevated troponin Suspect demand ischemia Troponin elevated at 2259 - 2260 - 2227. Last Echo shows normal EF with no abnormal wall motion. EKG did not show any evidence of ischemia. This maybe secondary to his Afib.Repeat EKG shows normal sinus rhythm with no obvious sign of ischemia.  Likely demand ischemia secondary to A. fib or muscle injury.  Dysphagia -Secondary to stroke.  Not aspiration events in the hospital -Not on antibiotics. -Speech therapy following. Currently on pured diet.  Essential hypertension -Currently on metoprolol 75 mg daily lisinopril 20 mg daily and amlodipine 10 mg daily. -Long term BP goal 130-150 given left ICA occlusion -Continue to monitor.  Hyperlipidemia -On Lipitor 80 mg daily.  Transaminitis -Improving trend.  Continue to monitor. -Okay to continue Lipitor at this time Recent Labs  Lab 02/23/20 1741 02/25/20 0424 02/27/20 0149 03/01/20 0803  AST 203* 163* 96* 35  ALT 46* 56* 75* 62*  ALKPHOS 34* 42 37* 43  BILITOT 0.6 1.1 0.8 0.9  PROT 4.6* 6.4* 5.6* 5.4*  ALBUMIN 2.3* 2.8* 2.5* 2.1*   Acute blood loss anemia -Improving.  Continue to monitor -Transfuse PRBC if hemoglobin less than 7 Recent Labs    02/23/20 0124 02/23/20 0401 02/23/20 1741 02/24/20 1025 02/25/20 0424 02/26/20 0825 02/27/20 0149 02/28/20 0437 02/29/20 0305 03/01/20 0803  HGB 7.3* 7.2* 7.4* 9.4* 11.5* 10.4* 10.1* 11.0* 10.5* 10.9*   Hypokalemia -Potassium low at 3.1 today.  He has been consistently low on potassium.  I would start on 40 mg oral replacement daily. Recent Labs  Lab 02/26/20 0825 02/27/20 0149 02/28/20 1332 02/29/20 0305 03/01/20 0803  K 2.7* 3.4* 3.5 3.0* 3.1*   Tobacco abuse -Counseled to quit.   Mobility: PT recommended CIR Code Status:   Code Status: Full Code  Nutritional status: There is no height or  weight on file to calculate BMI.     Diet Order            DIET - DYS 1 Room service appropriate? No; Fluid consistency: Thin  Diet effective now                 DVT prophylaxis: enoxaparin (LOVENOX) injection 40 mg Start: 02/25/20 1515 Foot Pump / plexipulse Start: 02/23/20 1742   Antimicrobials:  None Fluid: None  Consultants: Neurology, vascular surgery Family Communication:  None at bedside  Status is: Inpatient  Remains inpatient appropriate because:Ongoing diagnostic testing needed not appropriate for outpatient work up and Unsafe d/c plan   Dispo: The patient is from: Home              Anticipated d/c is to: CIR              Anticipated d/c date is: 3 days              Patient currently is not medically stable to d/c.  Infusions:  . sodium chloride Stopped (02/25/20 1233)    Scheduled Meds: . amLODipine  10 mg Oral Daily  . aspirin  300 mg Rectal Daily   Or  . aspirin  325 mg Oral Daily  . atorvastatin  80 mg Oral Daily  . chlorhexidine  15 mL  Mouth Rinse BID  . Chlorhexidine Gluconate Cloth  6 each Topical Daily  . clopidogrel  75 mg Oral Daily  . enoxaparin (LOVENOX) injection  40 mg Subcutaneous Q24H  . folic acid  1 mg Oral Daily  . guaiFENesin  600 mg Oral BID  . lisinopril  40 mg Oral Daily  . mouth rinse  15 mL Mouth Rinse q12n4p  . metoprolol tartrate  75 mg Oral BID  . nicotine  21 mg Transdermal Q2000  . pantoprazole  40 mg Oral Q2000  . sodium chloride HYPERTONIC  4 mL Nebulization BID    Antimicrobials: Anti-infectives (From admission, onward)   None      PRN meds: sodium chloride, acetaminophen, albuterol, docusate sodium, ipratropium-albuterol, labetalol, ondansetron (ZOFRAN) IV, polyethylene glycol   Objective: Vitals:   03/01/20 0431 03/01/20 0754  BP: (!) 162/71 (!) 155/69  Pulse:  71  Resp:  18  Temp: 98.5 F (36.9 C) 97.7 F (36.5 C)  SpO2: 95% 97%    Intake/Output Summary (Last 24 hours) at 03/01/2020 1028 Last  data filed at 03/01/2020 0500 Gross per 24 hour  Intake 240 ml  Output 2400 ml  Net -2160 ml   There were no vitals filed for this visit. Weight change:  There is no height or weight on file to calculate BMI.   Physical Exam: General exam: Appears calm and comfortable.  Not in physical distress Skin: No rashes, lesions or ulcers. HEENT: Atraumatic, normocephalic, supple neck, no obvious bleeding Lungs: Clear to auscultation bilaterally CVS: Regular rate and rhythm, no murmur GI/Abd soft, nontender, nondistended, bowel sound present CNS: Alert, awake, has significant expressive aphasia.  Able to follow motor commands to move left extremities but not the right. Psychiatry: Frustrated because of expressive aphasia. Extremities: No pedal edema, no calf tenderness  Data Review: I have personally reviewed the laboratory data and studies available.  Recent Labs  Lab 02/26/20 0825 02/27/20 0149 02/28/20 0437 02/29/20 0305 03/01/20 0803  WBC 9.0 9.5 12.1* 11.3* 11.3*  NEUTROABS  --   --   --   --  8.7*  HGB 10.4* 10.1* 11.0* 10.5* 10.9*  HCT 32.9* 32.1* 36.1* 32.7* 33.0*  MCV 97.6 99.1 100.6* 96.7 94.6  PLT 187 212 229 274 317   Recent Labs  Lab 02/23/20 1741 02/23/20 2113 02/25/20 0424 02/25/20 0630 02/26/20 0825 02/26/20 1520 02/27/20 0149 02/27/20 0734 02/27/20 2020 02/28/20 0801 02/28/20 1332 02/29/20 0305 03/01/20 0803  NA 139   < > 152*   < > 154*  156*   < > 160*   < > 154* 151* 150* 143 141  K 3.3*   < > 3.2*   < > 2.7*  --  3.4*  --   --   --  3.5 3.0* 3.1*  CL 105   < > 116*   < > 118*  --  124*  --   --   --  113* 107 106  CO2 24   < > 22   < > 25  --  27  --   --   --  27 27 26   GLUCOSE 159*   < > 155*   < > 253*  --  150*  --   --   --  134* 135* 131*  BUN 11   < > 10   < > 15  --  19  --   --   --  21* 18 13  CREATININE 0.68   < >  0.72   < > 0.75  --  0.76  --   --   --  0.69 0.68 0.68  CALCIUM 7.9*   < > 8.7*   < > 8.7*  --  8.8*  --   --   --  8.9  8.6* 8.5*  MG 1.9  --  2.2  --   --   --   --   --   --   --   --   --  2.1  PHOS 1.9*  --   --   --   --   --   --   --   --   --   --   --  3.5   < > = values in this interval not displayed.   Lab Results  Component Value Date   HGBA1C 6.2 (H) 02/23/2020       Component Value Date/Time   CHOL 132 02/24/2020 0748   TRIG 129 02/24/2020 0748   HDL 35 (L) 02/24/2020 0748   CHOLHDL 3.8 02/24/2020 0748   VLDL 26 02/24/2020 0748   LDLCALC 71 02/24/2020 0748    Signed, Lorin Glass, MD [facility] Pager: [phone] (Secure Chat preferred). 03/01/2020

## 2020-03-02 NOTE — [place] FL2 (Signed)
[place] MEDICAID FL2 LEVEL OF CARE SCREENING TOOL     IDENTIFICATION  Patient Name: Jonathan [name] Birthdate: 09-Feb-1961 Sex: male Admission Date (Current Location): 02/23/2020  [facility] and [place] Number:  Chiropodist and Address:  [facility], [address], [place], [place] [address]      Provider Number: [id number]  Attending Physician Name and Address:  Lorin Glass, MD  Relative Name and Phone Number:       Current Level of Care: Hospital Recommended Level of Care: Skilled Nursing Facility Prior Approval Number:    Date Approved/Denied:   PASRR Number: [id number]  Discharge Plan: SNF    Current Diagnoses: Patient Active Problem List   Diagnosis Date Noted  . Essential hypertension   . Tobacco abuse   . Dyslipidemia   . Leukocytosis   . Acute blood loss anemia   . Global aphasia   . 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 Endotracheal tube present   . Tobacco use 02/21/2020  . Ischemic leg 02/21/2020  . Aphasia S/P CVA 09/29/2019  . History of stroke 09/29/2019  . Hypertension, essential 09/29/2019  . PAD (peripheral artery disease) (HCC) 10/25/2016  . DDD (degenerative disc disease), cervical 01/07/2016  . Neck pain 01/07/2016  . Hyperlipidemia, unspecified 05/23/2013  . ED (erectile dysfunction) 11/13/2012    Orientation RESPIRATION BLADDER Height & Weight      (unable to assess)  Normal Incontinent Weight:   Height:     BEHAVIORAL SYMPTOMS/MOOD NEUROLOGICAL BOWEL NUTRITION STATUS      Incontinent Diet (see DC summary)  AMBULATORY STATUS COMMUNICATION OF NEEDS Skin   Extensive Assist Verbally Surgical wounds, Wound Vac (left groin wound vac (prevena), left leg foam dressing)                       Personal Care Assistance Level of Assistance  Bathing, Feeding, Dressing Bathing Assistance: Maximum assistance Feeding assistance: Limited assistance Dressing Assistance:  Maximum assistance     Functional Limitations Info  Sight, Speech Sight Info: Impaired   Speech Info: Impaired (aphasic)    SPECIAL CARE FACTORS FREQUENCY  PT (By licensed PT), OT (By licensed OT), Speech therapy     PT Frequency: 5x/wk OT Frequency: 5x/wk     Speech Therapy Frequency: 5x/wk      Contractures Contractures Info: Not present    Additional Factors Info  Code Status, Allergies Code Status Info: Full Allergies Info: NKA           Current Medications (03/02/2020):  This is the current hospital active medication list Current Facility-Administered Medications  Medication Dose Route Frequency Provider Last Rate Last Admin  . 0.9 %  sodium chloride infusion   Intravenous PRN Duayne Cal, NP   Stopped at 02/25/20 1233  . acetaminophen (TYLENOL) tablet 650 mg  650 mg Oral Q6H PRN Marvel Plan, MD      . albuterol (PROVENTIL) (2.5 MG/3ML) 0.083% nebulizer solution 2.5 mg  2.5 mg Nebulization Q2H PRN Odette Fraction, MD      . amLODipine (NORVASC) tablet 10 mg  10 mg Oral Daily Cheri Fowler, MD   10 mg at 03/02/20 0921  . apixaban (ELIQUIS) tablet 5 mg  5 mg Oral BID Lorin Glass, MD   5 mg at 03/02/20 0921  . atorvastatin (LIPITOR) tablet 80 mg  80 mg Oral Daily Marvel Plan, MD   80 mg at 03/02/20 0921  .  chlorhexidine (PERIDEX) 0.12 % solution 15 mL  15 mL Mouth Rinse BID Duayne Cal, NP   15 mL at 03/02/20 0920  . Chlorhexidine Gluconate Cloth 2 % PADS 6 each  6 each Topical Daily Icard, Bradley L, DO   6 each at 03/02/20 2542  . clopidogrel (PLAVIX) tablet 75 mg  75 mg Oral Daily Marvel Plan, MD   75 mg at 03/02/20 0921  . docusate sodium (COLACE) capsule 100 mg  100 mg Oral BID PRN Odette Fraction, MD      . folic acid (FOLVITE) tablet 1 mg  1 mg Oral Daily Doran Stabler, DO   1 mg at 03/02/20 7062  . guaiFENesin (MUCINEX) 12 hr tablet 600 mg  600 mg Oral BID Doran Stabler, DO   600 mg at 03/02/20 3762  . ipratropium-albuterol (DUONEB) 0.5-2.5 (3) MG/3ML  nebulizer solution 3 mL  3 mL Nebulization Q4H PRN Odette Fraction, MD      . labetalol (NORMODYNE) injection 5 mg  5 mg Intravenous Q4H PRN Doran Stabler, DO   5 mg at 02/27/20 0352  . lisinopril (ZESTRIL) tablet 40 mg  40 mg Oral Daily Doran Stabler, DO   40 mg at 03/02/20 8315  . MEDLINE mouth rinse  15 mL Mouth Rinse q12n4p Duayne Cal, NP   15 mL at 03/01/20 1623  . metoprolol tartrate (LOPRESSOR) tablet 75 mg  75 mg Oral BID Cheri Fowler, MD   75 mg at 03/02/20 0921  . nicotine (NICODERM CQ - dosed in mg/24 hours) patch 21 mg  21 mg Transdermal Q2000 Marvel Plan, MD   21 mg at 03/01/20 2043  . ondansetron (ZOFRAN) injection 4 mg  4 mg Intravenous Q6H PRN Odette Fraction, MD      . pantoprazole (PROTONIX) EC tablet 40 mg  40 mg Oral Q2000 Marvel Plan, MD   40 mg at 03/01/20 2042  . polyethylene glycol (MIRALAX / GLYCOLAX) packet 17 g  17 g Oral Daily PRN Odette Fraction, MD      . potassium chloride SA (KLOR-CON) CR tablet 40 mEq  40 mEq Oral Daily Dahal, Melina Schools, MD   40 mEq at 03/02/20 0920     Discharge Medications: Please see discharge summary for a list of discharge medications.  Relevant Imaging Results:  Relevant Lab Results:   Additional Information SS#: [id number]  Baldemar Lenis, LCSW

## 2020-03-02 NOTE — Progress Notes (Signed)
  Speech Language Pathology Treatment: Cognitive-Linquistic  Patient Details Name: Jonathan [name] MRN: [id number] DOB: Jul 17, 1961 Today's Date: 03/02/2020 Time: 1345-1410 SLP Time Calculation (min) (ACUTE ONLY): 25 min  Assessment / Plan / Recommendation Clinical Impression  Patient seen to address receptive and expressive aphasia goals. He pointed to object pictures in field of two with 75% accuracy, but this decreased to less than 50% when field increased to 3-4. He was able to answer biographical yes/no questions with use of verbal yes/no as well as yes/no printed on dry erase board. He confirmed response by using both modes of communication. He continues to not be able to name any objects/pictures and does not imitate SLP. All tasks performed during this session were in a controlled environment and were highly structured; patient was only able to communicate via facial expressions, gestures that his left foot was uncomfortable, but unable to identify even with choices, what precisely was the issue. Patient did not show any s/s frustration, but did frequently seem confused when first starting a new language task.   HPI HPI: 59 y.o. male with a history of hypertension, hyperlipidemia, CVA, severe PAD  who presented with acute critical limb ischemia secondary to occlusion of the iliac artery who underwent stenting on 8/6 at [facility].  He was taken back to the OR on 8/7 found to have acute thrombosis of bilateral iliac stents underwent thrombectomy and endarterectomy;  fasciotomies of the left leg. Extubated 8/8. Developed right hemiplegia and aphasia morning of 8/8. CTH: large territory infarct left MCA. Pt transferred to [facility] same day.        SLP Plan  Continue with current plan of care       Recommendations   CIR                Follow up Recommendations: Inpatient Rehab SLP Visit Diagnosis: Aphasia (R47.01) Plan: Continue with current plan of care       GO          Angela Nevin, MA, CCC-SLP Speech Therapy [facility] Acute Rehab Pager: [phone]

## 2020-03-02 NOTE — Progress Notes (Signed)
PROGRESS NOTE  DEVEION DENZ  DOB: July 24, 1960  PCP: Dione Housekeeper, MD GGY:[id number]  DOA: 02/23/2020  LOS: 8 days   Chief complaint: Acute limb ischemia  Brief narrative: Jonathan [name] is a 59 y.o. male with PMH of HTN, HLD, chronic smoking, significant peripheral artery disease.  8/6, patient went to see his PCP for progressively worsening pain of his left leg for a month.  He was suspected to have acute limb ischemia and was urgently referred to vascular surgery. Vascular surgery confirmed an acute ischemic left lower extremity.  Patient underwent an emergency angiogram and stent placement in bilateral common iliac arteries and external iliac arteries.  8/7, he was found to have acute thrombosis of bilateral iliac stents for which which he underwentbilateral thrombectomy, left popliteal exposure and thrombectomy, left femoral endarterectomy and patch angioplasty as well as left fasciotomy.Argatroban was started for concerning of heparin-induced thrombocytopenia.  8/8, patient developed slurred speech, right-sided weakness. CT head showed large left MCA stroke.  Due to the large size of his infarct, there was a concern that he may develop malignant cerebral edema.  Therefore he was transferred to [facility] for close neuro ICU monitoring.  Was is transferred from [facility] to Stroke work-up done. Because of the risk of hemorrhagic conversion argatroban drip was stopped.   Stroke work-up was completed. Patient was transferred to hospital service on 8/13.  Subjective: Patient was seen and examined this morning. Lying in bed.  Not in distress.  Acknowledges my presence. Had significant comprehensive aphasia  Assessment/Plan: Left MCA stroke Left ICA occlusion -CT head showed large left MCA stroke -CTA head and neck and carotid duplex showed left ICA occlusion at origin, right ICA less than 50% stenosis. -Repeat CT 8/13 showed stable large left MCA infarct withmildly  improved midline shift -2D Echo EF 60 to 65% -LDL 71 -HgbA1c 6.2 -After discussion with neurology and vascular surgery, patient is now on Eliquis 5 mg daily and Plavix 75 mg daily. -Clinically, patient has significant global aphasia.  Cerebral edema Hypernatremia -resolved After the initial CT showed large left MCA infarct with mild midline shift, patient was started on 3% normal saline.  Currently off saline. -Sodium level was elevated because of 3% saline, peaked at 160, improving.  Back to normal now. Recent Labs  Lab 02/26/20 1520 02/26/20 1918 02/27/20 0149 02/27/20 0734 02/27/20 1311 02/27/20 2020 02/28/20 0801 02/28/20 1332 02/29/20 0305 03/01/20 0803  NA 159* 157* 160* 160* 156* 154* 151* 150* 143 141   Acute lower leg ischemia status post angioplasty and stent placement -Patient underwent thrombectomy and endarterectomy with fasciotomy.  -Currently on aspirin 325 mg daily and Plavix 75 mg daily -From tomorrow, patient will be on Eliquis 5 mg daily and Plavix 75 mg daily.  I have informed vascular surgeon about it  Ruled out HIT -Post stenting, platelets dropped.  There was a concern for heparin-induced thrombocytopenia. HIT panel was negative -Currently on Lovenox for DVT prophylaxis.   -Plan to switch to Eliquis from tomorrow.  New onset A. fib with RVR -Initially required Cardizem drip.  Currently rate controlled on metoprolol 75 mg twice daily.   -Echo showed a normal EF of 60-65% with normal right and left ventricular function. -He is not a candidate for anticoagulation due to risk of hemorrhagic conversion. May consider anticoagulation 2 weeks post stroke per neurology  Elevated troponin Suspect demand ischemia Troponin elevated at 2259 - 2260 - 2227. Last Echo shows normal EF with no abnormal wall  motion. EKG did not show any evidence of ischemia. This maybe secondary to his Afib.Repeat EKG shows normal sinus rhythm with no obvious sign of ischemia.   Likely demand ischemia secondary to A. fib or muscle injury.  Dysphagia -Secondary to stroke.  No aspiration events in the hospital -Not on antibiotics. -Speech therapy following. Currently on pured diet.  Essential hypertension -Currently on metoprolol 75 mg daily, lisinopril 20 mg daily and amlodipine 10 mg daily. -Long term BP goal 130-150 given left ICA occlusion -Continue to monitor.  Hyperlipidemia -On Lipitor 80 mg daily.  Transaminitis -Improving trend.  Continue to monitor. -Okay to continue Lipitor at this time Recent Labs  Lab 02/25/20 0424 02/27/20 0149 03/01/20 0803  AST 163* 96* 35  ALT 56* 75* 62*  ALKPHOS 42 37* 43  BILITOT 1.1 0.8 0.9  PROT 6.4* 5.6* 5.4*  ALBUMIN 2.8* 2.5* 2.1*   Acute blood loss anemia -Improving.  Continue to monitor -Transfuse PRBC if hemoglobin less than 7 Recent Labs    02/23/20 0124 02/23/20 0401 02/23/20 1741 02/24/20 1025 02/25/20 0424 02/26/20 0825 02/27/20 0149 02/28/20 0437 02/29/20 0305 03/01/20 0803  HGB 7.3* 7.2* 7.4* 9.4* 11.5* 10.4* 10.1* 11.0* 10.5* 10.9*   Hypokalemia -Potassium low at 3.1 today.  He has been consistently low on potassium.  I would start on 40 mg oral replacement daily. Recent Labs  Lab 02/26/20 0825 02/27/20 0149 02/28/20 1332 02/29/20 0305 03/01/20 0803  K 2.7* 3.4* 3.5 3.0* 3.1*   Tobacco abuse -Counseled to quit.   Mobility: PT recommended CIR however family wanted him on a SNF close to [facility] where they live. Code Status:   Code Status: Full Code  Nutritional status: There is no height or weight on file to calculate BMI.     Diet Order            DIET - DYS 1 Room service appropriate? No; Fluid consistency: Thin  Diet effective now                 DVT prophylaxis: Foot Pump / plexipulse Start: 02/23/20 1742   Antimicrobials:  None Fluid: None  Consultants: Neurology, vascular surgery Family Communication:  None at bedside  Status is:  Inpatient  Remains inpatient appropriate because:Ongoing diagnostic testing needed not appropriate for outpatient work up and Unsafe d/c plan   Dispo: The patient is from: Home              Anticipated d/c is to: SNF              Anticipated d/c date is: When SNF/insurance authorization available              Patient currently is medically stable to d/c.  Infusions:  . sodium chloride Stopped (02/25/20 1233)    Scheduled Meds: . amLODipine  10 mg Oral Daily  . apixaban  5 mg Oral BID  . atorvastatin  80 mg Oral Daily  . chlorhexidine  15 mL Mouth Rinse BID  . Chlorhexidine Gluconate Cloth  6 each Topical Daily  . clopidogrel  75 mg Oral Daily  . folic acid  1 mg Oral Daily  . guaiFENesin  600 mg Oral BID  . lisinopril  40 mg Oral Daily  . mouth rinse  15 mL Mouth Rinse q12n4p  . metoprolol tartrate  75 mg Oral BID  . nicotine  21 mg Transdermal Q2000  . pantoprazole  40 mg Oral Q2000  . potassium chloride  40 mEq Oral Daily  Antimicrobials: Anti-infectives (From admission, onward)   None      PRN meds: sodium chloride, acetaminophen, albuterol, docusate sodium, ipratropium-albuterol, labetalol, ondansetron (ZOFRAN) IV, polyethylene glycol   Objective: Vitals:   03/02/20 0839 03/02/20 1144  BP: (!) 141/78 134/70  Pulse: 73 73  Resp: 19 18  Temp: 98.8 F (37.1 C) 97.9 F (36.6 C)  SpO2: 96% 96%    Intake/Output Summary (Last 24 hours) at 03/02/2020 1404 Last data filed at 03/02/2020 0900 Gross per 24 hour  Intake 1073 ml  Output 1300 ml  Net -227 ml   There were no vitals filed for this visit. Weight change:  There is no height or weight on file to calculate BMI.   Physical Exam: General exam: Appears calm and comfortable.  Not in physical distress Skin: No rashes, lesions or ulcers. HEENT: Atraumatic, normocephalic, supple neck, no obvious bleeding Lungs: Clear to auscultation bilaterally CVS: Regular rate and rhythm, no murmur GI/Abd soft,  nontender, nondistended, bowel sound present CNS: Alert, awake, has significant global aphasia.  Able to follow motor commands to move left extremities but not the right. Psychiatry: Frustrated because of expressive aphasia. Extremities: No pedal edema, no calf tenderness  Data Review: I have personally reviewed the laboratory data and studies available.  Recent Labs  Lab 02/26/20 0825 02/27/20 0149 02/28/20 0437 02/29/20 0305 03/01/20 0803  WBC 9.0 9.5 12.1* 11.3* 11.3*  NEUTROABS  --   --   --   --  8.7*  HGB 10.4* 10.1* 11.0* 10.5* 10.9*  HCT 32.9* 32.1* 36.1* 32.7* 33.0*  MCV 97.6 99.1 100.6* 96.7 94.6  PLT 187 212 229 274 317   Recent Labs  Lab 02/25/20 0424 02/25/20 0630 02/26/20 0825 02/26/20 1520 02/27/20 0149 02/27/20 0734 02/27/20 2020 02/28/20 0801 02/28/20 1332 02/29/20 0305 03/01/20 0803  NA 152*   < > 154*  156*   < > 160*   < > 154* 151* 150* 143 141  K 3.2*   < > 2.7*  --  3.4*  --   --   --  3.5 3.0* 3.1*  CL 116*   < > 118*  --  124*  --   --   --  113* 107 106  CO2 22   < > 25  --  27  --   --   --  27 27 26   GLUCOSE 155*   < > 253*  --  150*  --   --   --  134* 135* 131*  BUN 10   < > 15  --  19  --   --   --  21* 18 13  CREATININE 0.72   < > 0.75  --  0.76  --   --   --  0.69 0.68 0.68  CALCIUM 8.7*   < > 8.7*  --  8.8*  --   --   --  8.9 8.6* 8.5*  MG 2.2  --   --   --   --   --   --   --   --   --  2.1  PHOS  --   --   --   --   --   --   --   --   --   --  3.5   < > = values in this interval not displayed.   Lab Results  Component Value Date   HGBA1C 6.2 (H) 02/23/2020       Component Value  Date/Time   CHOL 132 02/24/2020 0748   TRIG 129 02/24/2020 0748   HDL 35 (L) 02/24/2020 0748   CHOLHDL 3.8 02/24/2020 0748   VLDL 26 02/24/2020 0748   LDLCALC 71 02/24/2020 0748    Signed, Lorin Glass, MD [facility] Pager: [phone] (Secure Chat preferred). 03/02/2020

## 2020-03-02 NOTE — Progress Notes (Addendum)
  Progress Note    03/02/2020 7:43 AM * No surgery found *  Subjective:  No complaints this morning   Vitals:   03/02/20 0115 03/02/20 0354  BP:  (!) 146/72  Pulse: 62 65  Resp: 20 18  Temp:  98.5 F (36.9 C)  SpO2: 97% 94%   Physical Exam: Lungs:  Non labored Incisions:  R groin with prevena vac in place and good seal; L groin incision healing well; L lower leg incisions healing well with staples intact Extremities:  Symmetrical DP pulses Neurologic: R hemiplegia  CBC    Component Value Date/Time   WBC 11.3 (H) 03/01/2020 0803   RBC 3.49 (L) 03/01/2020 0803   HGB 10.9 (L) 03/01/2020 0803   HGB 16.8 08/14/2012 1008   HCT 33.0 (L) 03/01/2020 0803   HCT 49.0 08/14/2012 1008   PLT 317 03/01/2020 0803   PLT 291 08/14/2012 1008   MCV 94.6 03/01/2020 0803   MCV 92 08/14/2012 1008   MCH 31.2 03/01/2020 0803   MCHC 33.0 03/01/2020 0803   RDW 12.9 03/01/2020 0803   RDW 13.5 08/14/2012 1008   LYMPHSABS 1.5 03/01/2020 0803   MONOABS 1.0 03/01/2020 0803   EOSABS 0.1 03/01/2020 0803   BASOSABS 0.0 03/01/2020 0803    BMET    Component Value Date/Time   NA 141 03/01/2020 0803   NA 138 08/14/2012 1008   K 3.1 (L) 03/01/2020 0803   K 3.9 08/14/2012 1008   CL 106 03/01/2020 0803   CL 103 08/14/2012 1008   CO2 26 03/01/2020 0803   CO2 27 08/14/2012 1008   GLUCOSE 131 (H) 03/01/2020 0803   GLUCOSE 84 08/14/2012 1008   BUN 13 03/01/2020 0803   BUN 10 08/14/2012 1008   CREATININE 0.68 03/01/2020 0803   CREATININE 0.70 08/14/2012 1008   CALCIUM 8.5 (L) 03/01/2020 0803   CALCIUM 9.3 08/14/2012 1008   GFRNONAA >60 03/01/2020 0803   GFRNONAA >60 08/14/2012 1008   GFRAA >60 03/01/2020 0803   GFRAA >60 08/14/2012 1008    INR    Component Value Date/Time   INR 1.1 02/22/2020 0210     Intake/Output Summary (Last 24 hours) at 03/02/2020 0743 Last data filed at 03/01/2020 2030 Gross per 24 hour  Intake 960 ml  Output 1400 ml  Net -440 ml     Assessment/Plan:  59  y.o. male is s/p BLE thromboembolectomy, L CFA endarterectomy, L tibial thrombectomy, and L 4 compartment fasciotomy  BLE well perfused with symmetrical DP pulses Incisions healing well; R groin prevena with good seal No dressing needed L lower leg incisions Continue eliquis, plavix, and statin per Neurology Nothing further from vascular standpoint   Emilie Rutter, PA-C [facility] [phone] 03/02/2020 7:43 AM

## 2020-03-02 NOTE — Progress Notes (Signed)
Physical Therapy Treatment Patient Details Name: Jonathan [name] MRN: [id number] DOB: 17-Jan-1961 Today's Date: 03/02/2020    History of Present Illness Pt is 59 y/o M with PMH: DDD, HTN, HLD, h/o CVA with some acute aphasia (wife reports his speech was normal before this admission), and PAD s/p stent in 2017. Presented with L LE pain. Now s/p angio with PCI and stent placement on 8/6 and revascularization of L LE on 8/7. Extubated 02/23/20. Of note, during OT assessment, significant R sided weakness and significant aphasia present. OT notified RN immediately who called code stroke.    PT Comments    Patient appeared to be in pain on arrival. Motioning to lay his [name] down and immediately looked relieved. Noted continues with significant scrotal edema and scrotal sling made by OT could not be found. Patient agreed to work on standing (eventhough he would have to move through sitting EOB to get to standing). Pt required pericare due to incontinence of bowels and utilized activity to work on bending his legs, reaching across, turning his head to facilitate rolling each direction. Utilized pillow case to elevate scrotum as coming to sit EOB. Stood twice and returned to bed as pt could not tolerate the scrotal pain for sitting. RN made aware.     Follow Up Recommendations  SNF (family requesting therapies closer to home in SNF)     Equipment Recommendations  Other (comment) (TBA at next venue)    Recommendations for Other Services       Precautions / Restrictions Precautions Precautions: Fall Precaution Comments: scrotal sling provided on 8/13 (not found 8/16)    Mobility  Bed Mobility Overal bed mobility: Needs Assistance Bed Mobility: Rolling;Sidelying to Sit;Sit to Sidelying Rolling: Mod assist Sidelying to sit: Mod assist;+2 for physical assistance     Sit to sidelying: Mod assist;+2 for physical assistance General bed mobility comments: Pt required assist to initiate Rt LE  movement, assist to lift trunk, and assist to scoot to EOB   Transfers Overall transfer level: Needs assistance Equipment used: 2 person hand held assist (bed pad under pelvis) Transfers: Sit to/from Stand Sit to Stand: +2 physical assistance;Mod assist         General transfer comment: pt with impaired ability to fully stand upright despite use of pad under pelvis to act as sling for lifting assist; stood ~3/4 x 2 reps (pt unable to fully extend to upright-even with LLE)  Ambulation/Gait                 Stairs             Wheelchair Mobility    Modified Rankin (Stroke Patients Only) Modified Rankin (Stroke Patients Only) Pre-Morbid Rankin Score: No symptoms Modified Rankin: Severe disability     Balance Overall balance assessment: Needs assistance Sitting-balance support: Single extremity supported;Feet supported Sitting balance-Leahy Scale: Poor Sitting balance - Comments: min to modA for static sitting Postural control: Posterior lean;Left lateral lean Standing balance support: Bilateral upper extremity supported Standing balance-Leahy Scale: Zero Standing balance comment: maxA-maxA x2                            Cognition Arousal/Alertness: Awake/alert Behavior During Therapy: WFL for tasks assessed/performed Overall Cognitive Status: Difficult to assess Area of Impairment: Following commands;Problem solving                       Following Commands: Follows one step  commands inconsistently     Problem Solving: Difficulty sequencing;Requires verbal cues;Requires tactile cues General Comments: difficultly following one step commands with multi-modal cues       Exercises      General Comments General comments (skin integrity, edema, etc.): prior to sit EOB, supine PROM to [name] RLE       Pertinent Vitals/Pain Pain Assessment: Faces Faces Pain Scale: Hurts whole lot Pain Location: scrotum Pain Descriptors / Indicators:  Grimacing;Guarding Pain Intervention(s): Limited activity within patient's tolerance;Monitored during session;Repositioned;Other (comment) (end of session return to supine and elevated scrotum)    Home Living                      Prior Function            PT Goals (current goals can now be found in the care plan section) Acute Rehab PT Goals Patient Stated Goal: none stated Time For Goal Achievement: 03/12/20 Potential to Achieve Goals: Fair Progress towards PT goals: Progressing toward goals    Frequency    Min 3X/week      PT Plan Discharge plan needs to be updated    Co-evaluation              AM-PAC PT "6 Clicks" Mobility   Outcome Measure  Help needed turning from your back to your side while in a flat bed without using bedrails?: A Lot Help needed moving from lying on your back to sitting on the side of a flat bed without using bedrails?: A Lot Help needed moving to and from a bed to a chair (including a wheelchair)?: Total Help needed standing up from a chair using your arms (e.g., wheelchair or bedside chair)?: Total Help needed to walk in hospital room?: Total Help needed climbing 3-5 steps with a railing? : Total 6 Click Score: 8    End of Session Equipment Utilized During Treatment: Gait belt Activity Tolerance: Patient tolerated treatment well Patient left: with call bell/phone within reach;in bed;with bed alarm set Nurse Communication: Mobility status;Other (comment) (need to elevate scrotum) PT Visit Diagnosis: Unsteadiness on feet (R26.81);Other abnormalities of gait and mobility (R26.89);Muscle weakness (generalized) (M62.81);Difficulty in walking, not elsewhere classified (R26.2);Other symptoms and signs involving the nervous system (R29.898);Pain Pain - part of body:  (scrotum)     Time: 1275-1700 PT Time Calculation (min) (ACUTE ONLY): 39 min  Charges:  $Therapeutic Activity: 23-37 mins $Neuromuscular Re-education: 8-22 mins                       Jonathan [name], PT Pager [phone]    Jonathan [name] 03/02/2020, 3:07 PM

## 2020-03-02 NOTE — Progress Notes (Signed)
Inpatient Rehabilitation Admissions Coordinator  I met with patient at bedside for rehab assessment and then contacted his wife by phone. I discussed goals, expectations, and benefits of CIR rather than SNF rehab . They live in [place] and she prefers rehab at SNF closer to their home. I have alerted acute team and TOC. We will sign off at this time.  Danne Baxter, RN, MSN Rehab Admissions Coordinator [phone] 03/02/2020 11:09 AM

## 2020-03-03 LAB — CBC WITH DIFFERENTIAL/PLATELET
Abs Immature Granulocytes: 0.08 10*3/uL — ABNORMAL HIGH (ref 0.00–0.07)
Basophils Absolute: 0 10*3/uL (ref 0.0–0.1)
Basophils Relative: 0 %
Eosinophils Absolute: 0.1 10*3/uL (ref 0.0–0.5)
Eosinophils Relative: 1 %
HCT: 32.4 % — ABNORMAL LOW (ref 39.0–52.0)
Hemoglobin: 10.5 g/dL — ABNORMAL LOW (ref 13.0–17.0)
Immature Granulocytes: 1 %
Lymphocytes Relative: 10 %
Lymphs Abs: 1.2 10*3/uL (ref 0.7–4.0)
MCH: 30.5 pg (ref 26.0–34.0)
MCHC: 32.4 g/dL (ref 30.0–36.0)
MCV: 94.2 fL (ref 80.0–100.0)
Monocytes Absolute: 1 10*3/uL (ref 0.1–1.0)
Monocytes Relative: 9 %
Neutro Abs: 9.3 10*3/uL — ABNORMAL HIGH (ref 1.7–7.7)
Neutrophils Relative %: 79 %
Platelets: 330 10*3/uL (ref 150–400)
RBC: 3.44 MIL/uL — ABNORMAL LOW (ref 4.22–5.81)
RDW: 12.5 % (ref 11.5–15.5)
WBC: 11.7 10*3/uL — ABNORMAL HIGH (ref 4.0–10.5)
nRBC: 0 % (ref 0.0–0.2)

## 2020-03-03 LAB — BASIC METABOLIC PANEL
Anion gap: 8 (ref 5–15)
BUN: 10 mg/dL (ref 6–20)
CO2: 25 mmol/L (ref 22–32)
Calcium: 8.4 mg/dL — ABNORMAL LOW (ref 8.9–10.3)
Chloride: 103 mmol/L (ref 98–111)
Creatinine, Ser: 0.64 mg/dL (ref 0.61–1.24)
GFR calc Af Amer: >60 mL/min (ref >60)
GFR calc non Af Amer: >60 mL/min (ref >60)
Glucose, Bld: 135 mg/dL — ABNORMAL HIGH (ref 70–99)
Potassium: 3.2 mmol/L — ABNORMAL LOW (ref 3.5–5.1)
Sodium: 136 mmol/L (ref 135–145)

## 2020-03-03 NOTE — Plan of Care (Signed)
  Problem: Nutrition: Goal: Risk of aspiration will decrease Outcome: Progressing Goal: Dietary intake will improve Outcome: Progressing   

## 2020-03-03 NOTE — Plan of Care (Signed)
Pt alert and confused. Consulted RT for respiratory concerns. Rhonchi in all bases of lungs. Chest PT ordered for twice a day. Pt on RA. Wound vac intact. Incision clean and intact.  Problem: Education: Goal: Knowledge of disease or condition will improve Outcome: Progressing Goal: Knowledge of secondary prevention will improve Outcome: Progressing Goal: Knowledge of patient specific risk factors addressed and post discharge goals established will improve Outcome: Progressing Goal: Individualized Educational Video(s) Outcome: Progressing   Problem: Nutrition: Goal: Risk of aspiration will decrease Outcome: Progressing Goal: Dietary intake will improve Outcome: Progressing   Problem: Ischemic Stroke/TIA Tissue Perfusion: Goal: Complications of ischemic stroke/TIA will be minimized Outcome: Progressing

## 2020-03-03 NOTE — Progress Notes (Addendum)
[facility]  Subjective  - Comfortable speech aphasia     Objective 129/70 66 99.6 F (37.6 C) (Oral) 20 93%  Intake/Output Summary (Last 24 hours) at 03/03/2020 6256 Last data filed at 03/02/2020 2053 Gross per 24 hour  Intake 593 ml  Output 1075 ml  Net -482 ml     Palpable B DP pulses, left fasciotomy sites healing well Right groin incisional vac in place good seal Neurologic right hemiplegia without change  Assessment/Planning: 59 y.o. male is s/p BLE thromboembolectomy, L CFA endarterectomy, L tibial thrombectomy, and L 4 compartment fasciotomy   B LE well perfused with palpable pulses Left LE incision healing well Continue eliquis, plavix, and statin per Neurology F/U in 2-3 weeks  Mosetta Pigeon 03/03/2020 8:08 AM --  Laboratory Lab Results: Recent Labs    03/01/20 0803 03/03/20 0153  WBC 11.3* 11.7*  HGB 10.9* 10.5*  HCT 33.0* 32.4*  PLT 317 330   BMET Recent Labs    03/01/20 0803 03/03/20 0153  NA 141 136  K 3.1* 3.2*  CL 106 103  CO2 26 25  GLUCOSE 131* 135*  BUN 13 10  CREATININE 0.68 0.64  CALCIUM 8.5* 8.4*    COAG Lab Results  Component Value Date   INR 1.1 02/22/2020   INR 0.9 09/17/2019   No results found for: PTT  I agree with th e above.  I removed his left groin vac today.  Both femoral incisions look good as does the fasciotomy incision.  He can be discharged from a vascular perspective  Wells Latoya Maulding

## 2020-03-03 NOTE — Progress Notes (Signed)
PROGRESS NOTE  Jonathan [name]  DOB: 08-Feb-1961  PCP: Dione Housekeeper, MD ZOX:[id number]  DOA: 02/23/2020  LOS: 9 days   Chief complaint: Acute limb ischemia  Brief narrative: Jonathan [name] is a 59 y.o. male with PMH of HTN, HLD, chronic smoking, significant peripheral artery disease.  8/6, patient went to see his PCP for progressively worsening pain of his left leg for a month.  He was suspected to have acute limb ischemia and was urgently referred to vascular surgery. Vascular surgery confirmed an acute ischemic left lower extremity.  Patient underwent an emergency angiogram and stent placement in bilateral common iliac arteries and external iliac arteries.  8/7, he was found to have acute thrombosis of bilateral iliac stents for which which he underwentbilateral thrombectomy, left popliteal exposure and thrombectomy, left femoral endarterectomy and patch angioplasty as well as left fasciotomy.Argatroban was started for concerning of heparin-induced thrombocytopenia.  8/8, patient developed slurred speech, right-sided weakness. CT head showed large left MCA stroke.  Due to the large size of his infarct, there was a concern that he may develop malignant cerebral edema.  Therefore he was transferred to [facility] for close neuro ICU monitoring.  Was is transferred from [facility] to Stroke work-up done. Because of the risk of hemorrhagic conversion argatroban drip was stopped.   Stroke work-up was completed. Patient was transferred to hospital service on 8/13.  Subjective: Patient was seen and examined this morning. Lying down in bed.  Not in distress.  Has significant receptive and expressive aphasia.  Assessment/Plan: Left MCA stroke Left ICA occlusion -CT head showed large left MCA stroke -CTA head and neck and carotid duplex showed left ICA occlusion at origin, right ICA less than 50% stenosis. -Repeat CT 8/13 showed stable large left MCA infarct withmildly improved  midline shift -2D Echo EF 60 to 65% -LDL 71 -HgbA1c 6.2 -After discussion with neurology and vascular surgery, patient is now on Eliquis 5 mg daily and Plavix 75 mg daily along with Lipitor -Clinically, patient has significant receptive and expressive  Cerebral edema Hypernatremia -resolved After the initial CT showed large left MCA infarct with mild midline shift, patient was started on 3% normal saline.  Currently off saline. -Sodium level was elevated because of 3% saline, peaked at 160, improving.  Back to normal now. Recent Labs  Lab 02/26/20 1918 02/27/20 0149 02/27/20 0734 02/27/20 1311 02/27/20 2020 02/28/20 0801 02/28/20 1332 02/29/20 0305 03/01/20 0803 03/03/20 0153  NA 157* 160* 160* 156* 154* 151* 150* 143 141 136   Acute lower leg ischemia status post angioplasty and stent placement -Patient underwent thrombectomy and endarterectomy with fasciotomy.  -Discussed with neurology and vascular surgery. -Since 8/16, patient has been resumed on Eliquis 5 mg daily along with Plavix 75 mg daily.  Ruled out HIT -Post stenting, platelets dropped.  There was a concern for heparin-induced thrombocytopenia. HIT panel was negative -Currently on Lovenox for DVT prophylaxis.   -Plan to switch to Eliquis from tomorrow.  New onset A. fib with RVR -Initially required Cardizem drip.  Currently rate controlled on metoprolol 75 mg twice daily.   -Echo showed a normal EF of 60-65% with normal right and left ventricular function. -He is not a candidate for anticoagulation due to risk of hemorrhagic conversion. May consider anticoagulation 2 weeks post stroke per neurology.  Elevated troponin Suspect demand ischemia Troponin elevated at 2259 - 2260 - 2227. Last Echo shows normal EF with no abnormal wall motion. EKG did not show any evidence  of ischemia. This maybe secondary to his Afib.Repeat EKG shows normal sinus rhythm with no obvious sign of ischemia.  Likely demand ischemia  secondary to A. fib or muscle injury.  Dysphagia -Secondary to stroke.  No aspiration events in the hospital -Not on antibiotics. -Speech therapy following. Currently on pured diet. -Has wet cough and is at risk of aspiration. -Continue frequent suctioning, incentive spirometry  Essential hypertension -Currently on metoprolol 75 mg daily, lisinopril 20 mg daily and amlodipine 10 mg daily. -Long term BP goal 130-150 given left ICA occlusion -Continue to monitor.  Hyperlipidemia -On Lipitor 80 mg daily.  Transaminitis -Improving trend.  Continue to monitor. -Okay to continue Lipitor at this time Recent Labs  Lab 02/27/20 0149 03/01/20 0803  AST 96* 35  ALT 75* 62*  ALKPHOS 37* 43  BILITOT 0.8 0.9  PROT 5.6* 5.4*  ALBUMIN 2.5* 2.1*   Acute blood loss anemia -Improving.  Continue to monitor the trend -Transfuse PRBC if hemoglobin less than 7.  Recent Labs    02/23/20 0401 02/23/20 1741 02/24/20 1025 02/25/20 0424 02/26/20 0825 02/27/20 0149 02/28/20 0437 02/29/20 0305 03/01/20 0803 03/03/20 0153  HGB 7.2* 7.4* 9.4* 11.5* 10.4* 10.1* 11.0* 10.5* 10.9* 10.5*   Hypokalemia -Potassium level remains low at 3.2 today.  Continue potassium replacement 40 mEq daily.  Continue to monitor.  Recent Labs  Lab 02/27/20 0149 02/28/20 1332 02/29/20 0305 03/01/20 0803 03/03/20 0153  K 3.4* 3.5 3.0* 3.1* 3.2*   Tobacco abuse -Counseled to quit.   Mobility: PT recommended CIR however family wanted him on a SNF close to [facility] where they live. Code Status:   Code Status: Full Code  Nutritional status: There is no height or weight on file to calculate BMI.     Diet Order            DIET - DYS 1 Room service appropriate? No; Fluid consistency: Thin  Diet effective now                 DVT prophylaxis: Foot Pump / plexipulse Start: 02/23/20 1742   Antimicrobials:  None Fluid: None  Consultants: Neurology, vascular surgery Family Communication:  None  at bedside  Status is: Inpatient  Remains inpatient appropriate because:Ongoing diagnostic testing needed not appropriate for outpatient work up and Unsafe d/c plan   Dispo: The patient is from: Home              Anticipated d/c is to: SNF              Anticipated d/c date is: When SNF/insurance authorization available              Patient currently is medically stable to d/c.  Infusions:  . sodium chloride Stopped (02/25/20 1233)    Scheduled Meds: . amLODipine  10 mg Oral Daily  . apixaban  5 mg Oral BID  . atorvastatin  80 mg Oral Daily  . chlorhexidine  15 mL Mouth Rinse BID  . Chlorhexidine Gluconate Cloth  6 each Topical Daily  . clopidogrel  75 mg Oral Daily  . folic acid  1 mg Oral Daily  . guaiFENesin  600 mg Oral BID  . lisinopril  40 mg Oral Daily  . mouth rinse  15 mL Mouth Rinse q12n4p  . metoprolol tartrate  75 mg Oral BID  . nicotine  21 mg Transdermal Q2000  . pantoprazole  40 mg Oral Q2000  . potassium chloride  40 mEq Oral Daily  Antimicrobials: Anti-infectives (From admission, onward)   None      PRN meds: sodium chloride, acetaminophen, albuterol, docusate sodium, ipratropium-albuterol, labetalol, ondansetron (ZOFRAN) IV, polyethylene glycol   Objective: Vitals:   03/03/20 0329 03/03/20 0816  BP: 129/70 139/65  Pulse: 66 68  Resp: 20 18  Temp: 99.6 F (37.6 C) 99 F (37.2 C)  SpO2: 93% 96%    Intake/Output Summary (Last 24 hours) at 03/03/2020 1114 Last data filed at 03/02/2020 2053 Gross per 24 hour  Intake --  Output 775 ml  Net -775 ml   There were no vitals filed for this visit. Weight change:  There is no height or weight on file to calculate BMI.   Physical Exam: General exam: Appears calm and comfortable.  Not in physical distress Skin: No rashes, lesions or ulcers. HEENT: Atraumatic, normocephalic, supple neck, no obvious bleeding Lungs: Scattered rales bilaterally and impaired cough CVS: Regular rate and rhythm, no  murmur GI/Abd soft, nontender, nondistended, bowel sound present CNS: Alert, awake, has significant global aphasia.  Able to follow motor commands to move left extremities but not the right. Psychiatry: Frustrated because of receptive and expressive aphasia. Extremities: No pedal edema, no calf tenderness  Data Review: I have personally reviewed the laboratory data and studies available.  Recent Labs  Lab 02/27/20 0149 02/28/20 0437 02/29/20 0305 03/01/20 0803 03/03/20 0153  WBC 9.5 12.1* 11.3* 11.3* 11.7*  NEUTROABS  --   --   --  8.7* 9.3*  HGB 10.1* 11.0* 10.5* 10.9* 10.5*  HCT 32.1* 36.1* 32.7* 33.0* 32.4*  MCV 99.1 100.6* 96.7 94.6 94.2  PLT 212 229 274 317 330   Recent Labs  Lab 02/27/20 0149 02/27/20 0734 02/28/20 0801 02/28/20 1332 02/29/20 0305 03/01/20 0803 03/03/20 0153  NA 160*   < > 151* 150* 143 141 136  K 3.4*  --   --  3.5 3.0* 3.1* 3.2*  CL 124*  --   --  113* 107 106 103  CO2 27  --   --  27 27 26 25   GLUCOSE 150*  --   --  134* 135* 131* 135*  BUN 19  --   --  21* 18 13 10   CREATININE 0.76  --   --  0.69 0.68 0.68 0.64  CALCIUM 8.8*  --   --  8.9 8.6* 8.5* 8.4*  MG  --   --   --   --   --  2.1  --   PHOS  --   --   --   --   --  3.5  --    < > = values in this interval not displayed.   Lab Results  Component Value Date   HGBA1C 6.2 (H) 02/23/2020       Component Value Date/Time   CHOL 132 02/24/2020 0748   TRIG 129 02/24/2020 0748   HDL 35 (L) 02/24/2020 0748   CHOLHDL 3.8 02/24/2020 0748   VLDL 26 02/24/2020 0748   LDLCALC 71 02/24/2020 0748    Signed, 04/25/2020, MD [facility] Pager: [phone] (Secure Chat preferred). 03/03/2020

## 2020-03-03 NOTE — TOC Initial Note (Signed)
Transition of Care [facility]) - Initial/Assessment Note    Patient Details  Name: Jonathan [name] MRN: [id number] Date of Birth: 1961-06-26  [facility]) CM/SW Contact:    Jonathan Lenis, LCSW Phone Number: 03/03/2020, 4:24 PM  Clinical Narrative:       CSW spoke with patient's wife over the phone to discuss SNF placement. CSW discussed CMS choice, and emailed list to patient's wife. She reviewed and contacted CSW with preferences for [facility], [facility], and [facility]. [facility] has offered a bed and the others are pending. CSW to follow.    Expected Discharge Plan: Skilled Nursing Facility Barriers to Discharge: Insurance Authorization, Continued Medical Work up   Patient Goals and CMS Choice Patient states their goals for this hospitalization and ongoing recovery are:: patient unable to participate in goal setting, aphasic and not fully oriented CMS Medicare.gov Compare Post Acute Care list provided to:: Patient Represenative (must comment) Choice offered to / list presented to : Spouse  Expected Discharge Plan and Services Expected Discharge Plan: Skilled Nursing Facility     Post Acute Care Choice: Skilled Nursing Facility Living arrangements for the past 2 months: Single Family Home                                      Prior Living Arrangements/Services Living arrangements for the past 2 months: Single Family Home Lives with:: Spouse Patient language and need for interpreter reviewed:: No Do you feel safe going back to the place where you live?: Yes      Need for Family Participation in Patient Care: Yes (Comment) Care giver support system in place?: No (comment)   Criminal Activity/Legal Involvement Pertinent to Current Situation/Hospitalization: No - Comment as needed  Activities of Daily Living   ADL Screening (condition at time of admission) Patient's cognitive ability adequate to safely complete daily activities?:  No  Permission Sought/Granted Permission sought to share information with : Facility Medical sales representative, Family Supports Permission granted to share information with : Yes, Verbal Permission Granted  Share Information with NAME: Jonathan [name]  Permission granted to share info w AGENCY: SNF  Permission granted to share info w Relationship: Spouse     Emotional Assessment Appearance:: Appears stated age Attitude/Demeanor/Rapport: Unable to Assess Affect (typically observed): Unable to Assess Orientation: : Oriented to Self, Oriented to Place Alcohol / Substance Use: Not Applicable Psych Involvement: No (comment)  Admission diagnosis:  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 Essential hypertension   . Tobacco abuse   . Dyslipidemia   . Leukocytosis   . Acute blood loss anemia   . Global aphasia   . 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 Endotracheal tube present   . Tobacco use 02/21/2020  . Ischemic leg 02/21/2020  . Aphasia S/P CVA 09/29/2019  . History of stroke 09/29/2019  . Hypertension, essential 09/29/2019  . PAD (peripheral artery disease) (HCC) 10/25/2016  . DDD (degenerative disc disease), cervical 01/07/2016  . Neck pain 01/07/2016  . Hyperlipidemia, unspecified 05/23/2013  . ED (erectile dysfunction) 11/13/2012   PCP:  Dione Housekeeper, MD Pharmacy:   [facility] [address], [place] - [address] [place] [address] Phone: [phone] Fax: [phone]     Social Determinants of Health (SDOH) Interventions  Readmission Risk Interventions No flowsheet data found.

## 2020-03-04 DIAGNOSIS — I69320 Aphasia following cerebral infarction: Secondary | ICD-10-CM

## 2020-03-04 LAB — CBC WITH DIFFERENTIAL/PLATELET
Abs Immature Granulocytes: 0.07 10*3/uL (ref 0.00–0.07)
Basophils Absolute: 0 10*3/uL (ref 0.0–0.1)
Basophils Relative: 0 %
Eosinophils Absolute: 0.1 10*3/uL (ref 0.0–0.5)
Eosinophils Relative: 1 %
HCT: 32.5 % — ABNORMAL LOW (ref 39.0–52.0)
Hemoglobin: 10.4 g/dL — ABNORMAL LOW (ref 13.0–17.0)
Immature Granulocytes: 1 %
Lymphocytes Relative: 12 %
Lymphs Abs: 1.4 10*3/uL (ref 0.7–4.0)
MCH: 30.1 pg (ref 26.0–34.0)
MCHC: 32 g/dL (ref 30.0–36.0)
MCV: 94.2 fL (ref 80.0–100.0)
Monocytes Absolute: 1.1 10*3/uL — ABNORMAL HIGH (ref 0.1–1.0)
Monocytes Relative: 9 %
Neutro Abs: 9.6 10*3/uL — ABNORMAL HIGH (ref 1.7–7.7)
Neutrophils Relative %: 77 %
Platelets: 361 10*3/uL (ref 150–400)
RBC: 3.45 MIL/uL — ABNORMAL LOW (ref 4.22–5.81)
RDW: 12.7 % (ref 11.5–15.5)
WBC: 12.2 10*3/uL — ABNORMAL HIGH (ref 4.0–10.5)
nRBC: 0 % (ref 0.0–0.2)

## 2020-03-04 LAB — BASIC METABOLIC PANEL
Anion gap: 8 (ref 5–15)
BUN: 17 mg/dL (ref 6–20)
CO2: 25 mmol/L (ref 22–32)
Calcium: 8.5 mg/dL — ABNORMAL LOW (ref 8.9–10.3)
Chloride: 104 mmol/L (ref 98–111)
Creatinine, Ser: 0.65 mg/dL (ref 0.61–1.24)
GFR calc Af Amer: >60 mL/min (ref >60)
GFR calc non Af Amer: >60 mL/min (ref >60)
Glucose, Bld: 138 mg/dL — ABNORMAL HIGH (ref 70–99)
Potassium: 3.5 mmol/L (ref 3.5–5.1)
Sodium: 137 mmol/L (ref 135–145)

## 2020-03-04 MED ORDER — ENSURE ENLIVE PO LIQD
237.0000 mL | Freq: Two times a day (BID) | ORAL | Status: DC
  Administered 2020-03-04 – 2020-03-09 (×9): 237 mL via ORAL

## 2020-03-04 MED ORDER — ADULT MULTIVITAMIN W/MINERALS CH
1.0000 | ORAL_TABLET | Freq: Every day | ORAL | Status: DC
  Administered 2020-03-04 – 2020-03-10 (×7): 1 via ORAL
  Filled 2020-03-04 (×7): qty 1

## 2020-03-04 NOTE — Plan of Care (Signed)
  Problem: Nutrition: Goal: Risk of aspiration will decrease Outcome: Progressing   Problem: Ischemic Stroke/TIA Tissue Perfusion: Goal: Complications of ischemic stroke/TIA will be minimized Outcome: Progressing   

## 2020-03-04 NOTE — Progress Notes (Signed)
[facility]  PROGRESS NOTE  Jonathan [name] WUJ:[id number] DOB: January 04, 1961 DOA: 02/23/2020 PCP: Dione Housekeeper, MD Admit date - 02/23/2020   Admitting Physician Odette Fraction, MD  Outpatient Primary MD for the patient is Zada Finders Joycie Peek, MD  LOS - 10 Brief Narrative   Jonathan [name] is a 59 y.o. year old male with medical history significant for HTN, HLD, chronic smoking, significant peripheral artery disease who presented on 02/23/2020 worsening left leg pain for the past month and was admitted for high concern for acute limb ischemia, patient underwent emergent angiogram and stent placement and bilateral iliac arteries and external iliac arteries, course complicated by acute thrombosis of iliac stents requiring thrombectomy on 8/7 argatroban drip was also started due to concern for heparin-induced thrombocytopenia.  On 8/8 in setting of new slurred speech and right-sided weakness CT head was obtained which showed left-sided MCA he was transferred to [facility] from [facility] for neuro ICU monitoring due to concern for malignant cerebral edema   Subjective  Today he has no complaints.  No acute events overnight  A & P   Acute left MCA stroke, left ICA occlusion.  Large left MCA stroke with CT head, repeat showed stable MCA and improved midline shift on 8/13.  Continues to have right-sided hemiplegia and receptive/expressive aphasia -Continue Eliquis, Plavix and Lipitor  Cerebral edema in setting of above.  Briefly required hypertonic normal saline during hospital course that led to some hypernatremia.  No longer on hypertonic saline, sodium levels within normal limits, repeat CT scan shows stable cerebral edema without worsening midline shift   Acute limb ischemia, status post angioplasty and stent placement.  Complicated by thrombectomy/endarterectomy with fasciotomy -Continue Eliquis and Plavix  Concern for HIT, ruled out.  HIT panel negative. -Continue to monitor on  Eliquis  Atrial fibrillation with RVR, new onset.  Currently rate controlled.  Briefly required Cardizem drip. -Normal rate on metoprolol 75 mg twice daily -TTE with preserved EF -Currently on Eliquis  Scrotal edema, stable -Continue to monitor -Scrotal sling  Elevated troponin, suspect demand ischemia troponin elevated 2000 2059 down to 2227, EF remains preserved with no wall motion abnormalities, and no ischemic changes on EKG.  Likely demand ischemia in the setting of A. fib and CVA  Dysphagia secondary to stroke.  No aspiration events in the hospital -Speech recommends pured diet -Aspiration precautions  HTN, stable -Continue metoprolol, lisinopril, amlodipine  HLD, stable -Continue Lipitor  Transaminitis, continue trending -Okay to continue Lipitor  Acute blood loss anemia in setting of operative intervention as mentioned above -Hemoglobin stable-transfuse if hemoglobin less than 7  Hypokalemia -Improved with oral replacement      Family Communication  : None  Code Status : Full  Disposition Plan  :  Patient is from home. Anticipated d/c date:  1-2 days. Barriers to d/c or necessity for inpatient status:  Medically stable, currently awaiting SNF placement Consults  : Vascular surgery, neurology  Procedures  :    DVT Prophylaxis  : Eliquis, Plavix  Lab Results  Component Value Date   PLT 361 03/04/2020    Diet :  Diet Order            DIET - DYS 1 Room service appropriate? No; Fluid consistency: Thin  Diet effective now                  Inpatient Medications Scheduled Meds: . amLODipine  10 mg Oral Daily  . apixaban  5 mg Oral  BID  . atorvastatin  80 mg Oral Daily  . chlorhexidine  15 mL Mouth Rinse BID  . Chlorhexidine Gluconate Cloth  6 each Topical Daily  . clopidogrel  75 mg Oral Daily  . feeding supplement (ENSURE ENLIVE)  237 mL Oral BID BM  . folic acid  1 mg Oral Daily  . guaiFENesin  600 mg Oral BID  . lisinopril  40 mg Oral Daily   . mouth rinse  15 mL Mouth Rinse q12n4p  . metoprolol tartrate  75 mg Oral BID  . multivitamin with minerals  1 tablet Oral Daily  . nicotine  21 mg Transdermal Q2000  . pantoprazole  40 mg Oral Q2000  . potassium chloride  40 mEq Oral Daily   Continuous Infusions: . sodium chloride Stopped (02/25/20 1233)   PRN Meds:.sodium chloride, acetaminophen, albuterol, docusate sodium, ipratropium-albuterol, labetalol, ondansetron (ZOFRAN) IV, polyethylene glycol  Antibiotics  :   Anti-infectives (From admission, onward)   None       Objective   Vitals:   03/04/20 0440 03/04/20 0813 03/04/20 1131 03/04/20 1540  BP:  (!) 142/64 130/61 125/67  Pulse:  72 70 66  Resp:  18 16 20   Temp:  98.6 F (37 C) 97.6 F (36.4 C) 97.9 F (36.6 C)  TempSrc:  Oral Oral Oral  SpO2:  97% 95% 97%  Weight: 91.5 kg       SpO2: 97 % O2 Flow Rate (L/min): 2 L/min FiO2 (%): 21 %  Wt Readings from Last 3 Encounters:  03/04/20 91.5 kg  02/21/20 79.6 kg  02/21/20 79.4 kg     Intake/Output Summary (Last 24 hours) at 03/04/2020 1919 Last data filed at 03/04/2020 1835 Gross per 24 hour  Intake 340 ml  Output 1650 ml  Net -1310 ml    Physical Exam:     Awake Alert, Oriented X 3, Normal affect Right sided hemiplegia, some aphasia present Scrotal swelling present without tenderness [place].AT, Normal respiratory effort on room air, CTAB RRR,No Gallops,Rubs or new Murmurs,  +ve B.Sounds, Abd Soft, No tenderness, No rebound, guarding or rigidity.    I have personally reviewed the following:   Data Reviewed:  CBC Recent Labs  Lab 02/28/20 0437 02/29/20 0305 03/01/20 0803 03/03/20 0153 03/04/20 0137  WBC 12.1* 11.3* 11.3* 11.7* 12.2*  HGB 11.0* 10.5* 10.9* 10.5* 10.4*  HCT 36.1* 32.7* 33.0* 32.4* 32.5*  PLT 229 274 317 330 361  MCV 100.6* 96.7 94.6 94.2 94.2  MCH 30.6 31.1 31.2 30.5 30.1  MCHC 30.5 32.1 33.0 32.4 32.0  RDW 14.1 13.3 12.9 12.5 12.7  LYMPHSABS  --   --  1.5 1.2 1.4   MONOABS  --   --  1.0 1.0 1.1*  EOSABS  --   --  0.1 0.1 0.1  BASOSABS  --   --  0.0 0.0 0.0    Chemistries  Recent Labs  Lab 02/27/20 0149 02/27/20 0734 02/28/20 1332 02/29/20 0305 03/01/20 0803 03/03/20 0153 03/04/20 0137  NA 160*   < > 150* 143 141 136 137  K 3.4*  --  3.5 3.0* 3.1* 3.2* 3.5  CL 124*  --  113* 107 106 103 104  CO2 27  --  27 27 26 25 25   GLUCOSE 150*  --  134* 135* 131* 135* 138*  BUN 19  --  21* 18 13 10 17   CREATININE 0.76  --  0.69 0.68 0.68 0.64 0.65  CALCIUM 8.8*  --  8.9 8.6*  8.5* 8.4* 8.5*  MG  --   --   --   --  2.1  --   --   AST 96*  --   --   --  35  --   --   ALT 75*  --   --   --  62*  --   --   ALKPHOS 37*  --   --   --  43  --   --   BILITOT 0.8  --   --   --  0.9  --   --    < > = values in this interval not displayed.   ------------------------------------------------------------------------------------------------------------------ No results for input(s): CHOL, HDL, LDLCALC, TRIG, CHOLHDL, LDLDIRECT in the last 72 hours.  Lab Results  Component Value Date   HGBA1C 6.2 (H) 02/23/2020   ------------------------------------------------------------------------------------------------------------------ No results for input(s): TSH, T4TOTAL, T3FREE, THYROIDAB in the last 72 hours.  Invalid input(s): FREET3 ------------------------------------------------------------------------------------------------------------------ No results for input(s): VITAMINB12, FOLATE, FERRITIN, TIBC, IRON, RETICCTPCT in the last 72 hours.  Coagulation profile No results for input(s): INR, PROTIME in the last 168 hours.  No results for input(s): DDIMER in the last 72 hours.  Cardiac Enzymes No results for input(s): CKMB, TROPONINI, MYOGLOBIN in the last 168 hours.  Invalid input(s): CK ------------------------------------------------------------------------------------------------------------------ No results found for: BNP  Micro Results No  results found for this or any previous visit (from the past 240 hour(s)).  Radiology Reports CT HEAD WO CONTRAST  Result Date: 02/28/2020 CLINICAL DATA:  Stroke, follow-up. EXAM: CT HEAD WITHOUT CONTRAST TECHNIQUE: Contiguous axial images were obtained from the base of the skull through the vertex without intravenous contrast. COMPARISON:  CT head without contrast 02/26/2020, 02/24/2020, and 02/23/2019. FINDINGS: Brain: Large left MCA territory nonhemorrhagic infarct is again noted. Infarct involves the left lentiform nucleus and caudate head. Mass effect is again noted. Midline shift is stable to slightly improved at 4.5 mm. Partial effacement of left lateral ventricle is again noted. Sulcal effacement is stable. No significant extra-axial hemorrhage is present. No significant right-sided infarcts are present. Cerebellar atrophy is stable. No acute infarcts are present in the posterior fossa. Vascular: Atherosclerotic changes are present within the cavernous internal carotid arteries bilaterally. No hyperdense vessel is present. Skull: Calvarium is intact. No focal lytic or blastic lesions are present. No significant extracranial soft tissue lesion is present. Sinuses/Orbits: The paranasal sinuses and mastoid air cells are clear. Remote right orbital blowout fracture is stable. Globes and orbits are otherwise within normal limits. IMPRESSION: 1. Stable appearance of large left MCA territory nonhemorrhagic infarct. 2. Stable to slightly improved midline shift. 3. No significant extra-axial hemorrhage. 4. Stable atrophy and white matter disease. Electronically Signed   By: Marin Roberts M.D.   On: 02/28/2020 06:33   CT HEAD WO CONTRAST  Result Date: 02/26/2020 CLINICAL DATA:  Follow-up examination for acute stroke. EXAM: CT HEAD WITHOUT CONTRAST TECHNIQUE: Contiguous axial images were obtained from the base of the skull through the vertex without intravenous contrast. COMPARISON:  Prior CT from  02/24/2020. FINDINGS: Brain: Continued interval evolution of large left MCA territory infarct is seen, overall stable in size and distribution from previous. Associated regional mass effect with partial effacement of the left lateral ventricle and up to 5 mm of left-to-right shift, slightly worsened from previous. No hydrocephalus or ventricular trapping. Basilar cisterns remain patent. No evidence for hemorrhagic transformation. No other new acute large vessel territory infarct. No intracranial hemorrhage. No extra-axial fluid collection. Vascular: No hyperdense vessel.  Scattered vascular calcifications noted within the carotid siphons. Skull: Scalp soft tissues within normal limits. Calvarium unchanged. Sinuses/Orbits: Globes and orbital soft tissues demonstrate no acute finding. Paranasal sinuses and mastoid air cells remain largely clear. Other: None. IMPRESSION: 1. Continued interval evolution of large left MCA territory infarct, overall stable in size and distribution from previous. Associated regional mass effect with up to 5 mm of left-to-right shift, slightly worsened from previous. No hydrocephalus or ventricular trapping. No evidence for hemorrhagic transformation. 2. No other new acute intracranial abnormality. Electronically Signed   By: Rise Mu M.D.   On: 02/26/2020 02:07   CT HEAD WO CONTRAST  Result Date: 02/24/2020 CLINICAL DATA:  Stroke follow-up EXAM: CT HEAD WITHOUT CONTRAST TECHNIQUE: Contiguous axial images were obtained from the base of the skull through the vertex without intravenous contrast. COMPARISON:  02/23/2020 head CT. FINDINGS: Brain: Sequela of evolving left MCA territory infarct. 3-4 mm rightward midline shift is unchanged. No intracranial hemorrhage. No mass lesion. No ventriculomegaly or extra-axial fluid collection. Vascular: No hyperdense vessel. Bilateral skull base atherosclerotic calcifications. Skull: Negative for fracture or focal lesion. Sinuses/Orbits:  Normal orbits. Soft tissue impacted left maxillary molar, unchanged. Mild right sphenoid sinus mucosal thickening. No mastoid effusion. Other: None. IMPRESSION: Evolving left MCA territory infarct with unchanged 3-4 mm rightward midline shift. No intracranial hemorrhage. Electronically Signed   By: Stana Bunting M.D.   On: 02/24/2020 09:29   CT HEAD WO CONTRAST  Result Date: 02/23/2020 CLINICAL DATA:  Stroke follow-up EXAM: CT HEAD WITHOUT CONTRAST TECHNIQUE: Contiguous axial images were obtained from the base of the skull through the vertex without intravenous contrast. COMPARISON:  Head CT 02/21/2020 FINDINGS: Brain: Large area of cytotoxic edema throughout the left MCA territory. No acute hemorrhage. 3 mm rightward midline shift. Vascular: Atherosclerotic calcification of the internal carotid arteries at the skull base. Skull: Normal. Negative for fracture or focal lesion. Sinuses/Orbits: No acute finding. Other: None. IMPRESSION: 1. Slightly increased cytotoxic edema within the left MCA territory. 2. No acute hemorrhage. 3. 3 mm rightward midline shift. Electronically Signed   By: Deatra Robinson M.D.   On: 02/23/2020 22:00   US Carotid Bilateral  Result Date: 02/23/2020 CLINICAL DATA:  Slurred speech.  Carotid stenosis. EXAM: BILATERAL CAROTID DUPLEX ULTRASOUND TECHNIQUE: Wallace Cullens scale imaging, color Doppler and duplex ultrasound were performed of bilateral carotid and vertebral arteries in the neck. COMPARISON:  CTA neck 02/23/2020 FINDINGS: Criteria: Quantification of carotid stenosis is based on velocity parameters that correlate the residual internal carotid diameter with NASCET-based stenosis levels, using the diameter of the distal internal carotid lumen as the denominator for stenosis measurement. The following velocity measurements were obtained: RIGHT ICA: 117/63 cm/sec CCA: 130/38 cm/sec SYSTOLIC ICA/CCA RATIO:  0.9 ECA: 220 cm/sec LEFT ICA: Occluded CCA: 75/10 cm/sec ECA: 195 cm/sec RIGHT  CAROTID ARTERY: Mild atherosclerotic disease in the right common carotid artery. Heterogeneous plaque at the right carotid bulb. External carotid artery is patent with normal waveform. Atherosclerotic plaque in the proximal internal carotid artery. Normal waveforms and velocities in the internal carotid artery. RIGHT VERTEBRAL ARTERY: Antegrade flow and normal waveform in the right vertebral artery. LEFT CAROTID ARTERY: Left common carotid artery is patent with intimal thickening. Left carotid artery is patent. External carotid artery is patent with normal waveform. 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 No flow in the mid or distal left internal carotid artery. LEFT VERTEBRAL ARTERY: Antegrade flow and normal waveform in the left vertebral artery. IMPRESSION: 1. Left  internal carotid artery is occluded at the origin. Findings correspond with recent CTA findings. 2. Atherosclerotic plaque involving the carotid arteries. Estimated degree of stenosis in the right internal carotid artery is less than 50%. 3. Patent vertebral arteries with antegrade flow. Electronically Signed   By: Richarda Overlie M.D.   On: 02/23/2020 14:42   PERIPHERAL VASCULAR CATHETERIZATION  Result Date: 02/21/2020 See op note  CT CEREBRAL PERFUSION W CONTRAST  Result Date: 02/23/2020 CLINICAL DATA:  Neuro deficit, acute, stroke suspected. EXAM: CT ANGIOGRAPHY HEAD AND NECK CT PERFUSION BRAIN TECHNIQUE: Multidetector CT imaging of the head and neck was performed using the standard protocol during bolus administration of intravenous contrast. Multiplanar CT image reconstructions and MIPs were obtained to evaluate the vascular anatomy. Carotid stenosis measurements (when applicable) are obtained utilizing NASCET criteria, using the distal internal carotid diameter as the denominator. Multiphase CT imaging of the brain was performed following IV bolus contrast injection. Subsequent parametric perfusion maps were calculated using  RAPID software. CONTRAST:  OMNIPAQUE IOHEXOL 350 MG/ML SOLN COMPARISON:  Noncontrast head CT performed earlier the same day. FINDINGS: CTA NECK FINDINGS Aortic arch: The left vertebral artery arises directly from the aortic arch. Atherosclerotic plaque within the visualized aortic arch and proximal major branch vessels of the neck. No hemodynamically significant innominate or proximal subclavian artery stenosis. Right carotid system: The CCA and ICA are patent within the neck. Atherosclerotic plaque within these vessels. Most notably there is at least moderate calcified plaque within the carotid bifurcation. Exact quantification of stenosis within the proximal right ICA is difficult due to motion artifact and blooming artifact of calcified plaque at this site. Additionally, there is 50-60% stenosis of the mid cervical ICA. Left carotid system: The CCA is patent to the bifurcation. 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 Vertebral arteries: The dominant right vertebral artery is patent throughout the neck. Moderate/severe atherosclerotic narrowing within the V1 right vertebral artery. The non dominant left vertebral artery is developmentally diminutive, but patent throughout the neck Skeleton: Reversal of the expected cervical lordosis. Cervical spondylosis with multilevel disc space narrowing, posterior disc osteophytes and uncovertebral hypertrophy. Other neck: No neck mass or cervical lymphadenopathy. Upper chest: No consolidation within the imaged lung apices. 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 There is minimal reconstitution of flow within the supraclinoid left ICA. The M1 and M2 left middle cerebral arteries are patent, although with  asymmetrically decreased opacification as compared to the right side. Additionally, there is a moderate to moderately severe focal stenosis within a superior division proximal M2 left MCA branch (series 8, image 17). The intracranial right internal carotid artery is patent. However, there are sites of severe stenosis within the cavernous and paraclinoid segments. The M1 right middle cerebral artery is patent without significant stenosis. No right M2 proximal branch occlusion is identified. There is a moderate to moderately severe stenosis within an inferior division mid M2 right MCA branch vessel (series 9, image 19). The A1 left anterior cerebral artery is hypoplastic. The A1 right anterior cerebral artery is dominant. The A2 and more distal anterior cerebral arteries are patent. However, there are multifocal high-grade stenoses within the bilateral A2 anterior cerebral arteries. Posterior circulation: The non dominant intracranial left vertebral artery is developmentally diminutive, but patent  and terminates as the left PICA. The dominant intracranial right vertebral artery is patent, although with sites of moderate stenosis. The basilar artery is patent. The posterior cerebral arteries are patent. However, there is a high-grade focal stenosis within the P2 left PCA and high-grade focal stenosis within the right PCA at the P2/P3 junction. Venous sinuses: Within limitations of contrast timing, no convincing thrombus. Anatomic variants: Posterior communicating arteries are hypoplastic or absent bilaterally. Review of the MIP images confirms the above findings CT Brain Perfusion Findings: ASPECTS: 4 CBF (<30%) Volume: 89mL (left MCA vascular territory) Perfusion (Tmax>6.0s) volume: (left MCA vascular territory) Mismatch Volume: 60mL Infarction Location:Left MCA vascular territory. These results were called by telephone at the time of interpretation on 02/23/2020 at 12:40 pm to provider [facility] , who  verbally acknowledged these results. IMPRESSION: CTA neck: 1. 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 2. There is at least moderate calcified plaque at the right carotid bifurcation. There is stenosis at the origin of the cervical right vertebral artery which is poorly quantified due to motion degradation and blooming artifact from calcified plaque at this site. Carotid artery duplex is recommended for further evaluation. Additionally, there is apparent 50-60% stenosis of the mid cervical right ICA. 3. Moderate/severe atherosclerotic narrowing within the Dom V1 right vertebral artery. 4. The left vertebral artery is developmentally diminutive, but patent throughout the neck. CTA head: 1. The left internal carotid artery remains occluded intracranially throughout the siphon region. There is minimal reconstitution of flow within the supraclinoid left ICA. The M1 and M2 left middle cerebral arteries are patent, although with asymmetrically decreased opacification as compared to the right side. Additionally, there is a moderate to moderately severe focal stenosis within a superior division proximal M2 left MCA branch. 2. Additional intracranial atherosclerotic disease with multifocal stenoses, most notably as follows. 3. High-grade stenoses within the cavernous/paraclinoid right ICA. 4. Moderate to moderately severe focal stenosis within an inferior division mid M2 right MCA branch vessel. 5. Moderate stenosis within the V4 right vertebral artery. 6. High-grade stenosis within the P2 left PCA. 7. High-grade stenosis within the right PCA at the P2/P3 junction. 8. Multifocal high-grade stenoses within the A2 anterior cerebral arteries bilaterally. CT perfusion head: The perfusion software identifies an 89 mL core infarct within the left MCA vascular territory. The perfusion software identifies a 149 mL region of critically hypoperfused parenchyma within  the left MCA vascular territory. Reported mismatch volume 60 mL. Electronically Signed   By: Jackey Loge DO   On: 02/23/2020 12:53   DG CHEST PORT 1 VIEW  Result Date: 02/24/2020 CLINICAL DATA:  59 year old male with shortness of breath. EXAM: PORTABLE CHEST 1 VIEW COMPARISON:  Chest radiograph dated 02/22/2020. FINDINGS: Status post removal of the endotracheal and enteric tube. There is diffuse interstitial densities which may be chronic or represent edema or atypical pneumonia. No large pleural effusion. No pneumothorax. Stable cardiac silhouette. No acute osseous pathology. IMPRESSION: Interval removal of the endotracheal and enteric tubes. Electronically Signed   By: Elgie Collard M.D.   On: 02/24/2020 19:11   Portable Chest x-ray  Result Date: 02/22/2020 CLINICAL DATA:  Left cerebral artery and polyp stroke, postop bilateral iliofemoral thrombectomy, ETT EXAM: PORTABLE CHEST 1 VIEW COMPARISON:  01/12/2013 FINDINGS: Lungs are essentially clear.  No pleural effusion or pneumothorax. The heart is normal in size. Endotracheal tube terminates 5 cm above the carina. Enteric tube terminates in the  proximal gastric body. IMPRESSION: Endotracheal tube terminates 5 cm above the carina. Enteric tube terminates in the proximal gastric body. Electronically Signed   By: Charline Bills M.D.   On: 02/22/2020 08:54   DG Swallowing Func-Speech Pathology  Result Date: 02/28/2020 Objective Swallowing Evaluation: Type of Study: MBS-Modified Barium Swallow Study  Patient Details Name: ALLARD LIGHTSEY MRN: [id number] Date of Birth: 1961-01-15 Today's Date: 02/28/2020 Time: SLP Start Time (ACUTE ONLY): 1457 -SLP Stop Time (ACUTE ONLY): 1513 SLP Time Calculation (min) (ACUTE ONLY): 16 min Past Medical History: Past Medical History: Diagnosis Date . Aphasia S/P CVA  . DDD (degenerative disc disease), cervical  . ED (erectile dysfunction)  . Hyperlipidemia  . Hypertension  . Peripheral arterial disease (HCC)  . Stroke  [facility])  Past Surgical History: Past Surgical History: Procedure Laterality Date . FEMORAL-FEMORAL BYPASS GRAFT Bilateral 02/22/2020  Procedure: bilateral ileofemoral thrombectomy, left popliteal exposure and thrombectomy, four compartment fasciotomy, left femoral endarterectomy with patch angioplasty;  Surgeon: Nada Libman, MD;  Location: [facility] ORS;  Service: Vascular;  Laterality: Bilateral; . LOWER EXTREMITY ANGIOGRAPHY Left 02/21/2020  Procedure: LOWER EXTREMITY ANGIOGRAPHY;  Surgeon: Renford Dills, MD;  Location: [facility] INVASIVE CV LAB;  Service: Cardiovascular;  Laterality: Left; . PERIPHERAL VASCULAR CATHETERIZATION N/A 07/21/2015  Procedure: Abdominal Aortogram w/Lower Extremity;  Surgeon: Renford Dills, MD;  Location: [facility] INVASIVE CV LAB;  Service: Cardiovascular;  Laterality: N/A; . PERIPHERAL VASCULAR CATHETERIZATION  07/21/2015  Procedure: Lower Extremity Intervention;  Surgeon: Renford Dills, MD;  Location: [facility] INVASIVE CV LAB;  Service: Cardiovascular;; HPI: 59 y.o. male with a history of hypertension, hyperlipidemia, CVA, severe PAD  who presented with acute critical limb ischemia secondary to occlusion of the iliac artery who underwent stenting on 8/6 at [facility].  He was taken back to the OR on 8/7 found to have acute thrombosis of bilateral iliac stents underwent thrombectomy and endarterectomy;  fasciotomies of the left leg. Extubated 8/8. Developed right hemiplegia and aphasia morning of 8/8. CTH: large territory infarct left MCA. Pt transferred to [facility] same day.   Subjective: alert, aphasic Assessment / Plan / Recommendation CHL IP CLINICAL IMPRESSIONS 02/28/2020 Clinical Impression Pt's oropharyngeal swallow is stable compared to MBS completed on 8/9 even with pt self-feeding throughout this study (see that note for more specifics regarding physiology). Pt was allowed to self-feed at his own rate, drinking cups of thin barium without stopping. Across multiple small cupfuls, there was no  aspiration. Trace penetration occurred x2 with thin liquids but cleared spontaneously as he continued to swallow additional boluses. Would continue Dys 1 (puree) diet and thin liquids with full supervision for safety.  SLP Visit Diagnosis Dysphagia, oropharyngeal phase (R13.12) Attention and concentration deficit following -- Frontal lobe and executive function deficit following -- Impact on safety and function Mild aspiration risk   CHL IP TREATMENT RECOMMENDATION 02/28/2020 Treatment Recommendations Therapy as outlined in treatment plan below   Prognosis 02/28/2020 Prognosis for Safe Diet Advancement Good Barriers to Reach Goals Language deficits Barriers/Prognosis Comment -- CHL IP DIET RECOMMENDATION 02/28/2020 SLP Diet Recommendations Dysphagia 1 (Puree) solids;Thin liquid Liquid Administration via Cup;Straw Medication Administration Whole meds with puree Compensations Minimize environmental distractions;Slow rate;Small sips/bites Postural Changes Seated upright at 90 degrees;Remain semi-upright after after feeds/meals (Comment)   CHL IP OTHER RECOMMENDATIONS 02/28/2020 Recommended Consults -- Oral Care Recommendations Oral care BID Other Recommendations Have oral suction available   CHL IP FOLLOW UP RECOMMENDATIONS 02/28/2020 Follow up Recommendations Inpatient Rehab   CHL IP FREQUENCY AND  DURATION 02/28/2020 Speech Therapy Frequency (ACUTE ONLY) min 2x/week Treatment Duration 2 weeks      CHL IP ORAL PHASE 02/28/2020 Oral Phase Impaired Oral - Pudding Teaspoon -- Oral - Pudding Cup -- Oral - Honey Teaspoon -- Oral - Honey Cup -- Oral - Nectar Teaspoon -- Oral - Nectar Cup -- Oral - Nectar Straw NT Oral - Thin Teaspoon -- Oral - Thin Cup Right anterior bolus loss;Weak lingual manipulation;Reduced posterior propulsion;Holding of bolus;Right pocketing in lateral sulci;Pocketing in anterior sulcus;Lingual/palatal residue;Piecemeal swallowing;Decreased bolus cohesion Oral - Thin Straw Right anterior bolus loss;Weak  lingual manipulation;Reduced posterior propulsion;Holding of bolus;Right pocketing in lateral sulci;Pocketing in anterior sulcus;Lingual/palatal residue;Piecemeal swallowing;Decreased bolus cohesion Oral - Puree Right anterior bolus loss;Weak lingual manipulation;Reduced posterior propulsion;Holding of bolus;Right pocketing in lateral sulci;Pocketing in anterior sulcus;Lingual/palatal residue;Piecemeal swallowing;Decreased bolus cohesion Oral - Mech Soft -- Oral - Regular NT Oral - Multi-Consistency -- Oral - Pill -- Oral Phase - Comment --  CHL IP PHARYNGEAL PHASE 02/28/2020 Pharyngeal Phase Impaired Pharyngeal- Pudding Teaspoon -- Pharyngeal -- Pharyngeal- Pudding Cup -- Pharyngeal -- Pharyngeal- Honey Teaspoon -- Pharyngeal -- Pharyngeal- Honey Cup -- Pharyngeal -- Pharyngeal- Nectar Teaspoon -- Pharyngeal -- Pharyngeal- Nectar Cup -- Pharyngeal -- Pharyngeal- Nectar Straw NT Pharyngeal -- Pharyngeal- Thin Teaspoon -- Pharyngeal -- Pharyngeal- Thin Cup Delayed swallow initiation-pyriform sinuses;Delayed swallow initiation-vallecula Pharyngeal -- Pharyngeal- Thin Straw Delayed swallow initiation-pyriform sinuses;Delayed swallow initiation-vallecula Pharyngeal -- Pharyngeal- Puree Delayed swallow initiation-pyriform sinuses;Delayed swallow initiation-vallecula Pharyngeal -- Pharyngeal- Mechanical Soft -- Pharyngeal -- Pharyngeal- Regular NT Pharyngeal -- Pharyngeal- Multi-consistency -- Pharyngeal -- Pharyngeal- Pill -- Pharyngeal -- Pharyngeal Comment --  CHL IP CERVICAL ESOPHAGEAL PHASE 02/28/2020 Cervical Esophageal Phase WFL Pudding Teaspoon -- Pudding Cup -- Honey Teaspoon -- Honey Cup -- Nectar Teaspoon -- Nectar Cup -- Nectar Straw -- Thin Teaspoon -- Thin Cup -- Thin Straw -- Puree -- Mechanical Soft -- Regular -- Multi-consistency -- Pill -- Cervical Esophageal Comment -- Mahala Menghini., M.A. CCC-SLP Acute Rehabilitation Services Pager [phone] Office [phone] 02/28/2020, 4:22 PM              DG  Swallowing Func-Speech Pathology  Result Date: 02/24/2020 Objective Swallowing Evaluation: Type of Study: MBS-Modified Barium Swallow Study  Patient Details Name: Jonathan [name] MRN: [id number] Date of Birth: [birth year] Today's Date: 02/24/2020 Time: SLP Start Time (ACUTE ONLY): 1310 -SLP Stop Time (ACUTE ONLY): 1340 SLP Time Calculation (min) (ACUTE ONLY): 30 min Past Medical History: Past Medical History: Diagnosis Date . Aphasia S/P CVA  . DDD (degenerative disc disease), cervical  . ED (erectile dysfunction)  . Hyperlipidemia  . Hypertension  . Peripheral arterial disease (HCC)  . Stroke [facility])  Past Surgical History: Past Surgical History: Procedure Laterality Date . FEMORAL-FEMORAL BYPASS GRAFT Bilateral 02/22/2020  Procedure: bilateral ileofemoral thrombectomy, left popliteal exposure and thrombectomy, four compartment fasciotomy, left femoral endarterectomy with patch angioplasty;  Surgeon: Nada Libman, MD;  Location: [facility] ORS;  Service: Vascular;  Laterality: Bilateral; . LOWER EXTREMITY ANGIOGRAPHY Left 02/21/2020  Procedure: LOWER EXTREMITY ANGIOGRAPHY;  Surgeon: Renford Dills, MD;  Location: [facility] INVASIVE CV LAB;  Service: Cardiovascular;  Laterality: Left; . PERIPHERAL VASCULAR CATHETERIZATION N/A 07/21/2015  Procedure: Abdominal Aortogram w/Lower Extremity;  Surgeon: Renford Dills, MD;  Location: [facility] INVASIVE CV LAB;  Service: Cardiovascular;  Laterality: N/A; . PERIPHERAL VASCULAR CATHETERIZATION  07/21/2015  Procedure: Lower Extremity Intervention;  Surgeon: Renford Dills, MD;  Location: [facility] INVASIVE CV LAB;  Service: Cardiovascular;; HPI: 59 y.o. male with a history of hypertension,  hyperlipidemia, CVA, severe PAD  who presented with acute critical limb ischemia secondary to occlusion of the iliac artery who underwent stent[facility] 8/6 [facility].  He was taken back to the OR on 8/7 found to have acute thrombosis of bilateral iliac stents underwent thrombectomy and endarterectomy;   fasciotomies of the left leg. Extubated 8/8. Developed right hemiplegia and aphasia morning of 8/8. CTH: large territory infarct left MCA. [facility].   Subjective: alert, aphasic Assessment / Plan / Recommendation CHL IP CLINICAL IMPRESSIONS 02/24/2020 Clinical Impression Pt presents with a primary oral dysphagia c/b impaired bolus cohesion and control over release into the pharynx.  There was decreased sensation on the right, with barium residue in right lateral and anterior sulci and limited awareness of its presence.  Pharyngeal function was largely intact, with adequate pharyngeal squeeze, no residue post-swallow, and effective laryngeal vestibule closure.  Occasionally, thin and nectar thick barium reached the pyriforms for a delayed period of time before the swallow was triggered.  On other occasions, the swallow trigger was timely.  There was one incident of penetration to the vocal folds when a bolus of nectar thick liquid prematurely escaped the oral cavity and entered the laryngeal vestibule without eliciting a response.  Pt's impaired sensation appears to be the primary concern and puts him at greater risk of aspiration.  For now, recommend starting a dysphagia 1 diet, thin liquids from cup only - NO STRAWS; give meds whole in applesauce/pudding.  Due to pt's impulsivity, difficulty following commands, and poor sensation/awareness on right side of mouth/throat, he will require careful supervision during meals.  SLP will follow for safety/diet progression and therapeutic training.   SLP Visit Diagnosis Dysphagia, oropharyngeal phase (R13.12) Attention and concentration deficit following -- Frontal lobe and executive function deficit following -- Impact on safety and function Mild aspiration risk   CHL IP TREATMENT RECOMMENDATION 02/24/2020 Treatment Recommendations Therapy as outlined in treatment plan below   Prognosis 02/24/2020 Prognosis for Safe Diet Advancement Good Barriers to Reach  Goals -- Barriers/Prognosis Comment -- CHL IP DIET RECOMMENDATION 02/24/2020 SLP Diet Recommendations Dysphagia 1 (Puree) solids;Thin liquid Liquid Administration via Cup;No straw Medication Administration Whole meds with puree Compensations Minimize environmental distractions;Slow rate;Small sips/bites Postural Changes --   CHL IP OTHER RECOMMENDATIONS 02/24/2020 Recommended Consults -- Oral Care Recommendations Oral care BID Other Recommendations --   CHL IP FOLLOW UP RECOMMENDATIONS 02/24/2020 Follow up Recommendations Inpatient Rehab   CHL IP FREQUENCY AND DURATION 02/24/2020 Speech Therapy Frequency (ACUTE ONLY) min 3x week Treatment Duration 2 weeks      CHL IP ORAL PHASE 02/24/2020 Oral Phase Impaired Oral - Pudding Teaspoon -- Oral - Pudding Cup -- Oral - Honey Teaspoon -- Oral - Honey Cup -- Oral - Nectar Teaspoon -- Oral - Nectar Cup -- Oral - Nectar Straw Right anterior bolus loss;Weak lingual manipulation;Reduced posterior propulsion;Holding of bolus;Right pocketing in lateral sulci;Pocketing in anterior sulcus;Lingual/palatal residue;Piecemeal swallowing;Decreased bolus cohesion Oral - Thin Teaspoon -- Oral - Thin Cup Right anterior bolus loss;Weak lingual manipulation;Reduced posterior propulsion;Holding of bolus;Right pocketing in lateral sulci;Pocketing in anterior sulcus;Lingual/palatal residue;Piecemeal swallowing;Decreased bolus cohesion Oral - Thin Straw Right anterior bolus loss;Weak lingual manipulation;Reduced posterior propulsion;Holding of bolus;Right pocketing in lateral sulci;Pocketing in anterior sulcus;Lingual/palatal residue;Piecemeal swallowing;Decreased bolus cohesion Oral - Puree Right anterior bolus loss;Weak lingual manipulation;Reduced posterior propulsion;Holding of bolus;Right pocketing in lateral sulci;Pocketing in anterior sulcus;Lingual/palatal residue;Piecemeal swallowing;Decreased bolus cohesion Oral - Mech Soft -- Oral - Regular Right anterior bolus loss;Weak lingual  manipulation;Reduced posterior propulsion;Holding of bolus;Right pocketing in lateral sulci;Pocketing in anterior sulcus;Lingual/palatal residue;Piecemeal swallowing;Decreased bolus cohesion Oral - Multi-Consistency -- Oral - Pill -- Oral Phase - Comment --  CHL IP PHARYNGEAL PHASE 02/24/2020 Pharyngeal Phase Impaired Pharyngeal- Pudding Teaspoon -- Pharyngeal -- Pharyngeal- Pudding Cup -- Pharyngeal -- Pharyngeal- Honey Teaspoon -- Pharyngeal -- Pharyngeal- Honey Cup -- Pharyngeal -- Pharyngeal- Nectar Teaspoon -- Pharyngeal -- Pharyngeal- Nectar Cup -- Pharyngeal -- Pharyngeal- Nectar Straw Delayed swallow initiation-pyriform sinuses;Delayed swallow initiation-vallecula;Penetration/Aspiration before swallow Pharyngeal Material enters airway, CONTACTS cords and not ejected out Pharyngeal- Thin Teaspoon -- Pharyngeal -- Pharyngeal- Thin Cup Delayed swallow initiation-pyriform sinuses;Delayed swallow initiation-vallecula Pharyngeal -- Pharyngeal- Thin Straw Delayed swallow initiation-vallecula;Delayed swallow initiation-pyriform sinuses Pharyngeal -- Pharyngeal- Puree Delayed swallow initiation-vallecula;Delayed swallow initiation-pyriform sinuses Pharyngeal -- Pharyngeal- Mechanical Soft -- Pharyngeal -- Pharyngeal- Regular Delayed swallow initiation-vallecula Pharyngeal -- Pharyngeal- Multi-consistency -- Pharyngeal -- Pharyngeal- Pill -- Pharyngeal -- Pharyngeal Comment --  CHL IP CERVICAL ESOPHAGEAL PHASE 02/24/2020 Cervical Esophageal Phase WFL Pudding Teaspoon -- Pudding Cup -- Honey Teaspoon -- Honey Cup -- Nectar Teaspoon -- Nectar Cup -- Nectar Straw -- Thin Teaspoon -- Thin Cup -- Thin Straw -- Puree -- Mechanical Soft -- Regular -- Multi-consistency -- Pill -- Cervical Esophageal Comment -- Blenda Mounts Laurice 02/24/2020, 2:58 PM              ECHOCARDIOGRAM COMPLETE  Result Date: 02/24/2020    ECHOCARDIOGRAM REPORT   Patient Name:   Jonathan [name] Date of Exam: 02/24/2020 Medical Rec #:  [id number]           Height:       67.0 in Accession #:    [id number]         Weight:       175.5 lb Date of Birth:  [birth year]          BSA:          1.913 m Patient Age:    59 years           BP:           159/96 mmHg Patient Gender: M                  HR:           106 bpm. Exam Location:  Inpatient Procedure: 2D Echo and Intracardiac Opacification Agent Indications:    Stroke 434.91 / I163.9  History:        Patient has no prior history of Echocardiogram examinations.                 Peripheral arterial disease, underwent stenting, complicated by                 thrombosis requiring thrombectomy and LLE fasciotomy. Developed                 left facial droop. 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  Sonographer:    Leta Jungling RDCS Referring Phys: [id number] JINDONG XU IMPRESSIONS  1. Left ventricular ejection fraction, by estimation, is 60 to 65%. Left ventricular ejection fraction by 2D MOD biplane is 63.2 %. The left ventricle has normal function. The left ventricle has no regional wall motion abnormalities. Left ventricular diastolic parameters are indeterminate.  2. Right ventricular systolic function is normal. The right ventricular size is normal. There is normal pulmonary artery systolic pressure. The estimated right ventricular systolic pressure is 30.9 mmHg.  3. The mitral valve is grossly normal. No evidence of mitral valve  regurgitation. No evidence of mitral stenosis.  4. The aortic valve is tricuspid. Aortic valve regurgitation is not visualized. No aortic stenosis is present.  5. The inferior vena cava is normal in size with greater than 50% respiratory variability, suggesting right atrial pressure of 3 mmHg. Conclusion(s)/Recommendation(s): No intracardiac source of embolism detected on this transthoracic study. A transesophageal echocardiogram is recommended to exclude cardiac source of embolism if clinically indicated. FINDINGS  Left Ventricle: Left ventricular ejection fraction, by estimation, is 60 to 65%. Left ventricular  ejection fraction by 2D MOD biplane is 63.2 %. The left ventricle has normal function. The left ventricle has no regional wall motion abnormalities. Definity contrast agent was given IV to delineate the left ventricular endocardial borders. The left ventricular internal cavity size was normal in size. There is no left ventricular hypertrophy. Left ventricular diastolic parameters are indeterminate. Right Ventricle: The right ventricular size is normal. No increase in right ventricular wall thickness. Right ventricular systolic function is normal. There is normal pulmonary artery systolic pressure. The tricuspid regurgitant velocity is 2.64 m/s, and  with an assumed right atrial pressure of 3 mmHg, the estimated right ventricular systolic pressure is 30.9 mmHg. Left Atrium: Left atrial size was normal in size. Right Atrium: Right atrial size was normal in size. Pericardium: Trivial pericardial effusion is present. Presence of pericardial fat pad. Mitral Valve: The mitral valve is grossly normal. No evidence of mitral valve regurgitation. No evidence of mitral valve stenosis. Tricuspid Valve: The tricuspid valve is grossly normal. Tricuspid valve regurgitation is trivial. No evidence of tricuspid stenosis. Aortic Valve: The aortic valve is tricuspid. Aortic valve regurgitation is not visualized. No aortic stenosis is present. Pulmonic Valve: The pulmonic valve was grossly normal. Pulmonic valve regurgitation is not visualized. No evidence of pulmonic stenosis. Aorta: The aortic root is normal in size and structure. Venous: The inferior vena cava is normal in size with greater than 50% respiratory variability, suggesting right atrial pressure of 3 mmHg. IAS/Shunts: The atrial septum is grossly normal.  LEFT VENTRICLE PLAX 2D                        Biplane EF (MOD) LVIDd:         4.90 cm         LV Biplane EF:   Left LVIDs:         3.60 cm                          ventricular LV PW:         0.90 cm                           ejection LV IVS:        1.10 cm                          fraction by LVOT diam:     1.90 cm                          2D MOD LV SV:         43                               biplane is LV SV Index:   22  63.2 %. LVOT Area:     2.84 cm                                Diastology                                LV e' lateral:   8.93 cm/s LV Volumes (MOD)               LV E/e' lateral: 11.4 LV vol d, MOD    98.1 ml       LV e' medial:    6.08 cm/s A2C:                           LV E/e' medial:  16.8 LV vol d, MOD    88.1 ml A4C: LV vol s, MOD    53.3 ml A2C: LV vol s, MOD    23.4 ml A4C: LV SV MOD A2C:   44.8 ml LV SV MOD A4C:   88.1 ml LV SV MOD BP:    60.6 ml RIGHT VENTRICLE RV S prime:     11.40 cm/s TAPSE (M-mode): 1.9 cm LEFT ATRIUM             Index       RIGHT ATRIUM           Index LA diam:        3.90 cm 2.04 cm/m  RA Area:     13.40 cm LA Vol (A2C):   50.2 ml 26.24 ml/m RA Volume:   34.30 ml  17.93 ml/m LA Vol (A4C):   63.9 ml 33.40 ml/m LA Biplane Vol: 58.4 ml 30.53 ml/m  AORTIC VALVE LVOT Vmax:   88.30 cm/s LVOT Vmean:  53.100 cm/s LVOT VTI:    0.151 m  AORTA Ao Root diam: 3.30 cm MITRAL VALVE                TRICUSPID VALVE MV Area (PHT): 7.16 cm     TR Peak grad:   27.9 mmHg MV Decel Time: 106 msec     TR Vmax:        264.00 cm/s MV E velocity: 102.00 cm/s MV A velocity: 49.20 cm/s   SHUNTS MV E/A ratio:  2.07         Systemic VTI:  0.15 m                             Systemic Diam: 1.90 cm Lennie Odor MD Electronically signed by Lennie Odor MD Signature Date/Time: 02/24/2020/1:09:12 PM    Final    CT HEAD CODE STROKE WO CONTRAST  Result Date: 02/23/2020 CLINICAL DATA:  Code stroke. Neuro deficit, acute, stroke suspected. Mental status change at 11 a.m., last known well 8 a.m. this morning. EXAM: CT HEAD WITHOUT CONTRAST TECHNIQUE: Contiguous axial images were obtained from the base of the skull through the vertex without intravenous contrast. COMPARISON:  Brain MRI  09/17/2018 FINDINGS: Brain: There is a large acute demarcated ischemic infarct within the left MCA vascular territory affecting the left frontal, temporal and parietal lobes as well as left insula and subinsular region. The left caudate and lentiform nuclei are also affected. No evidence of hemorrhagic conversion. There is mild associated mass effect with 3 mm rightward  midline shift. Redemonstrated superimposed small chronic infarcts within the left cerebral hemisphere. Background mild generalized parenchymal atrophy and chronic small vessel ischemic disease. No extra-axial fluid collection. No evidence of intracranial mass. Vascular: No hyperdense vessel Skull: Atherosclerotic calcifications. Sinuses/Orbits: Visualized orbits show no acute finding. Mild ethmoid sinus mucosal thickening. No significant mastoid effusion. ASPECTS [facility] Stroke Program Early CT Score) - Ganglionic level infarction (caudate, lentiform nuclei, internal capsule, insula, M1-M3 cortex): 2 - Supraganglionic infarction (M4-M6 cortex): 2 Total score (0-10 with 10 being normal): 4 These results were called by telephone at the time of interpretation on 02/23/2020 at 12:20 pm to provider Erick Blinks, who verbally acknowledged these results. IMPRESSION: Large acute demarcated left MCA vascular territory as described. ASPECTS is 4. No evidence of hemorrhagic conversion. Mild mass effect with 3 mm rightward midline shift. Stable background mild generalized parenchymal atrophy and chronic small vessel ischemic disease. Superimposed small chronic infarcts within the left cerebral hemisphere. Electronically Signed   By: Jackey Loge DO   On: 02/23/2020 12:22   CT ANGIO HEAD CODE STROKE  Result Date: 02/23/2020 CLINICAL DATA:  Neuro deficit, acute, stroke suspected. EXAM: CT ANGIOGRAPHY HEAD AND NECK CT PERFUSION BRAIN TECHNIQUE: Multidetector CT imaging of the head and neck was performed using the standard protocol during bolus administration  of intravenous contrast. Multiplanar CT image reconstructions and MIPs were obtained to evaluate the vascular anatomy. Carotid stenosis measurements (when applicable) are obtained utilizing NASCET criteria, using the distal internal carotid diameter as the denominator. Multiphase CT imaging of the brain was performed following IV bolus contrast injection. Subsequent parametric perfusion maps were calculated using RAPID software. CONTRAST:  OMNIPAQUE IOHEXOL 350 MG/ML SOLN COMPARISON:  Noncontrast head CT performed earlier the same day. FINDINGS: CTA NECK FINDINGS Aortic arch: The left vertebral artery arises directly from the aortic arch. Atherosclerotic plaque within the visualized aortic arch and proximal major branch vessels of the neck. No hemodynamically significant innominate or proximal subclavian artery stenosis. Right carotid system: The CCA and ICA are patent within the neck. Atherosclerotic plaque within these vessels. Most notably there is at least moderate calcified plaque within the carotid bifurcation. Exact quantification of stenosis within the proximal right ICA is difficult due to motion artifact and blooming artifact of calcified plaque at this site. Additionally, there is 50-60% stenosis of the mid cervical ICA. Left carotid system: The CCA is patent to the bifurcation. 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 Vertebral arteries: The dominant right vertebral artery is patent throughout the neck. Moderate/severe atherosclerotic narrowing within the V1 right vertebral artery. The non dominant left vertebral artery is developmentally diminutive, but patent throughout the neck Skeleton: Reversal of the expected cervical lordosis. Cervical spondylosis with multilevel disc space narrowing, posterior disc osteophytes and uncovertebral hypertrophy. Other neck: No neck mass or cervical lymphadenopathy. Upper chest: No consolidation within the imaged  lung apices. 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 There is minimal reconstitution of flow within the supraclinoid left ICA. The M1 and M2 left middle cerebral arteries are patent, although with asymmetrically decreased opacification as compared to the right side. Additionally, there is a moderate to moderately severe focal stenosis within a superior division proximal M2 left MCA branch (series 8, image 17). The intracranial right internal carotid artery  is patent. However, there are sites of severe stenosis within the cavernous and paraclinoid segments. The M1 right middle cerebral artery is patent without significant stenosis. No right M2 proximal branch occlusion is identified. There is a moderate to moderately severe stenosis within an inferior division mid M2 right MCA branch vessel (series 9, image 19). The A1 left anterior cerebral artery is hypoplastic. The A1 right anterior cerebral artery is dominant. The A2 and more distal anterior cerebral arteries are patent. However, there are multifocal high-grade stenoses within the bilateral A2 anterior cerebral arteries. Posterior circulation: The non dominant intracranial left vertebral artery is developmentally diminutive, but patent and terminates as the left PICA. The dominant intracranial right vertebral artery is patent, although with sites of moderate stenosis. The basilar artery is patent. The posterior cerebral arteries are patent. However, there is a high-grade focal stenosis within the P2 left PCA and high-grade focal stenosis within the right PCA at the P2/P3 junction. Venous sinuses: Within limitations of contrast timing, no convincing thrombus. Anatomic variants: Posterior communicating arteries are hypoplastic or absent bilaterally. Review  of the MIP images confirms the above findings CT Brain Perfusion Findings: ASPECTS: 4 CBF (<30%) Volume: 89mL (left MCA vascular territory) Perfusion (Tmax>6.0s) volume: (left MCA vascular territory) Mismatch Volume: 60mL Infarction Location:Left MCA vascular territory. These results were called by telephone at the time of interpretation on 02/23/2020 at 12:40 pm to provider [facility] , who verbally acknowledged these results. IMPRESSION: CTA neck: 1. 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 2. There is at least moderate calcified plaque at the right carotid bifurcation. There is stenosis at the origin of the cervical right vertebral artery which is poorly quantified due to motion degradation and blooming artifact from calcified plaque at this site. Carotid artery duplex is recommended for further evaluation. Additionally, there is apparent 50-60% stenosis of the mid cervical right ICA. 3. Moderate/severe atherosclerotic narrowing within the Dom V1 right vertebral artery. 4. The left vertebral artery is developmentally diminutive, but patent throughout the neck. CTA head: 1. The left internal carotid artery remains occluded intracranially throughout the siphon region. There is minimal reconstitution of flow within the supraclinoid left ICA. The M1 and M2 left middle cerebral arteries are patent, although with asymmetrically decreased opacification as compared to the right side. Additionally, there is a moderate to moderately severe focal stenosis within a superior division proximal M2 left MCA branch. 2. Additional intracranial atherosclerotic disease with multifocal stenoses, most notably as follows. 3. High-grade stenoses within the cavernous/paraclinoid right ICA. 4. Moderate to moderately severe focal stenosis within an inferior division mid M2 right MCA branch vessel. 5. Moderate stenosis within the V4 right vertebral artery. 6.  High-grade stenosis within the P2 left PCA. 7. High-grade stenosis within the right PCA at the P2/P3 junction. 8. Multifocal high-grade stenoses within the A2 anterior cerebral arteries bilaterally. CT perfusion head: The perfusion software identifies an 89 mL core infarct within the left MCA vascular territory. The perfusion software identifies a 149 mL region of critically hypoperfused parenchyma within the left MCA vascular territory. Reported mismatch volume 60 mL. Electronically Signed   By: Jackey Loge DO   On: 02/23/2020 12:53   CT ANGIO NECK CODE STROKE  Result Date: 02/23/2020 CLINICAL DATA:  Neuro deficit, acute, stroke suspected. EXAM: CT ANGIOGRAPHY HEAD AND NECK CT PERFUSION BRAIN TECHNIQUE: Multidetector CT imaging of the head and neck was performed using the standard protocol during  bolus administration of intravenous contrast. Multiplanar CT image reconstructions and MIPs were obtained to evaluate the vascular anatomy. Carotid stenosis measurements (when applicable) are obtained utilizing NASCET criteria, using the distal internal carotid diameter as the denominator. Multiphase CT imaging of the brain was performed following IV bolus contrast injection. Subsequent parametric perfusion maps were calculated using RAPID software. CONTRAST:  OMNIPAQUE IOHEXOL 350 MG/ML SOLN COMPARISON:  Noncontrast head CT performed earlier the same day. FINDINGS: CTA NECK FINDINGS Aortic arch: The left vertebral artery arises directly from the aortic arch. Atherosclerotic plaque within the visualized aortic arch and proximal major branch vessels of the neck. No hemodynamically significant innominate or proximal subclavian artery stenosis. Right carotid system: The CCA and ICA are patent within the neck. Atherosclerotic plaque within these vessels. Most notably there is at least moderate calcified plaque within the carotid bifurcation. Exact quantification of stenosis within the proximal right ICA is difficult  due to motion artifact and blooming artifact of calcified plaque at this site. Additionally, there is 50-60% stenosis of the mid cervical ICA. Left carotid system: The CCA is patent to the bifurcation. 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 Vertebral arteries: The dominant right vertebral artery is patent throughout the neck. Moderate/severe atherosclerotic narrowing within the V1 right vertebral artery. The non dominant left vertebral artery is developmentally diminutive, but patent throughout the neck Skeleton: Reversal of the expected cervical lordosis. Cervical spondylosis with multilevel disc space narrowing, posterior disc osteophytes and uncovertebral hypertrophy. Other neck: No neck mass or cervical lymphadenopathy. Upper chest: No consolidation within the imaged lung apices. 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 There is minimal reconstitution of flow within the supraclinoid left ICA. The M1 and M2 left middle cerebral arteries are patent, although with asymmetrically decreased opacification as compared to the right side. Additionally, there is a moderate to moderately severe focal stenosis within a superior division proximal M2 left MCA branch (series 8, image 17). The intracranial right internal carotid artery is patent. However, there are sites of severe stenosis within the cavernous and paraclinoid segments. The M1 right middle cerebral artery is patent without significant stenosis. No right M2 proximal branch occlusion is identified. There is a moderate to moderately severe stenosis within an inferior division mid M2 right MCA branch vessel (series 9, image 19). The A1 left anterior cerebral artery is hypoplastic. The A1 right anterior  cerebral artery is dominant. The A2 and more distal anterior cerebral arteries are patent. However, there are multifocal high-grade stenoses within the bilateral A2 anterior cerebral arteries. Posterior circulation: The non dominant intracranial left vertebral artery is developmentally diminutive, but patent and terminates as the left PICA. The dominant intracranial right vertebral artery is patent, although with sites of moderate stenosis. The basilar artery is patent. The posterior cerebral arteries are patent. However, there is a high-grade focal stenosis within the P2 left PCA and high-grade focal stenosis within the right PCA at the P2/P3 junction. Venous sinuses: Within limitations of contrast timing, no convincing thrombus. Anatomic variants: Posterior communicating arteries are hypoplastic or absent bilaterally. Review of the MIP images confirms the above findings CT Brain Perfusion Findings: ASPECTS: 4 CBF (<30%) Volume: 89mL (left MCA vascular territory) Perfusion (Tmax>6.0s) volume: (left MCA vascular territory) Mismatch Volume: 60mL Infarction Location:Left  MCA vascular territory. These results were called by telephone at the time of interpretation on 02/23/2020 at 12:40 pm to provider [facility] , who verbally acknowledged these results. IMPRESSION: CTA neck: 1. 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 2. There is at least moderate calcified plaque at the right carotid bifurcation. There is stenosis at the origin of the cervical right vertebral artery which is poorly quantified due to motion degradation and blooming artifact from calcified plaque at this site. Carotid artery duplex is recommended for further evaluation. Additionally, there is apparent 50-60% stenosis of the mid cervical right ICA. 3. Moderate/severe atherosclerotic narrowing within the Dom V1 right vertebral artery. 4. The left vertebral artery is  developmentally diminutive, but patent throughout the neck. CTA head: 1. The left internal carotid artery remains occluded intracranially throughout the siphon region. There is minimal reconstitution of flow within the supraclinoid left ICA. The M1 and M2 left middle cerebral arteries are patent, although with asymmetrically decreased opacification as compared to the right side. Additionally, there is a moderate to moderately severe focal stenosis within a superior division proximal M2 left MCA branch. 2. Additional intracranial atherosclerotic disease with multifocal stenoses, most notably as follows. 3. High-grade stenoses within the cavernous/paraclinoid right ICA. 4. Moderate to moderately severe focal stenosis within an inferior division mid M2 right MCA branch vessel. 5. Moderate stenosis within the V4 right vertebral artery. 6. High-grade stenosis within the P2 left PCA. 7. High-grade stenosis within the right PCA at the P2/P3 junction. 8. Multifocal high-grade stenoses within the A2 anterior cerebral arteries bilaterally. CT perfusion head: The perfusion software identifies an 89 mL core infarct within the left MCA vascular territory. The perfusion software identifies a 149 mL region of critically hypoperfused parenchyma within the left MCA vascular territory. Reported mismatch volume 60 mL. Electronically Signed   By: Jackey Loge DO   On: 02/23/2020 12:53     Time Spent in minutes  30     Laverna Peace M.D on 03/04/2020 at 7:19 PM  To page go to [email or web address] - password [facility]

## 2020-03-04 NOTE — Progress Notes (Signed)
Initial Nutrition Assessment  DOCUMENTATION CODES:   Obesity unspecified  INTERVENTION:  Ensure Enlive po BID, each supplement provides 350 kcal and 20 grams of protein  MVI daily  NUTRITION DIAGNOSIS:   Inadequate oral intake related to dysphagia as evidenced by meal completion < 50%.    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 Transferred to [facility] on 8/8. PMH includes HTN, HLD, PAD, tobacco use.  Pt with significant receptive and expressive aphasia. Pt on Dysphagia 1 diet with thin liquids. SLP following.   PO Intake: 30-75% x 5 recorded meals (49% average meal intake)  Reviewed wt history. No significant wt changes noted.  Labs reviewed. Medications: Folvite, Protonix, Klor-con  NUTRITION - FOCUSED PHYSICAL EXAM:  Unable to perform at this time. Will attempt at follow-up.   Diet Order:   Diet Order            DIET - DYS 1 Room service appropriate? No; Fluid consistency: Thin  Diet effective now                 EDUCATION NEEDS:   Not appropriate for education at this time  Skin:  Skin Assessment: Skin Integrity Issues: Skin Integrity Issues:: Incisions Incisions: L leg, groin  Last BM:  8/17  Height:   Ht Readings from Last 1 Encounters:  02/21/20 5\' 7"  (1.702 m)    Weight:   Wt Readings from Last 1 Encounters:  03/04/20 91.5 kg    Ideal Body Weight:  67.27 kg  BMI:  Body mass index is 31.61 kg/m.  Estimated Nutritional Needs:   Kcal:  2050-2250  Protein:  105-120 grams  Fluid:  >2L/d    02-17-1979, MS, RD, LDN RD  pager number and weekend/on-call pager number located in Amion.

## 2020-03-04 NOTE — Progress Notes (Signed)
Occupational Therapy Treatment Patient Details Name: Jonathan [name] MRN: [id number] DOB: [birth year] Today's Date: 03/04/2020    History of present illness Pt is 59 y/o M with PMH: DDD, HTN, HLD, h/o CVA with some acute aphasia (wife reports his speech was normal before this admission), and PAD s/p stent in 2017. Presented with L LE pain. Now s/p angio with PCI and stent placement on 8/6 and revascularization of L LE on 8/7. Extubated 02/23/20. Of note, during OT assessment, significant R sided weakness and significant aphasia present. OT notified RN immediately who called code stroke.   OT comments  Patient seen with PT for cotx.  Patient completing rolling in bed with min-mod assist for toileting hygiene given total assist, fabrication/donning of new scrotal sling.  Scrotal sling on during mobility but limited tolerance to mobility due to pain and edema.  Patient requires mod assist +2 to transition to EOB, max assist +2 for partial sit to stand for linen change (NT assisting), and max assist +2 for lateral scooting towards HOB.  Educated RN/NT on use of scrotal sling, elevation of scrotum while supine; at this time encouraged scrotal sling use only with mobilization to avoid soiling sling due to reports of incontinence. Also noted subluxation in R shoulder, elevated on pillows and may benefit from taping to shoulder.  Will follow acutely.    Follow Up Recommendations  CIR    Equipment Recommendations  Other (comment) (defer to next venue of care )    Recommendations for Other Services Rehab consult    Precautions / Restrictions Precautions Precautions: Fall Precaution Comments: scrotal sling provided Restrictions Weight Bearing Restrictions: No       Mobility Bed Mobility Overal bed mobility: Needs Assistance Bed Mobility: Rolling;Sidelying to Sit;Sit to Sidelying Rolling: Mod assist;Min assist (with rail; max cues; min to his right, mod to his left) Sidelying to sit: Mod  assist;+2 for physical assistance;HOB elevated     Sit to sidelying: Mod assist;+2 for physical assistance General bed mobility comments: Pt required assist to initiate Rt LE movement, assist to lift trunk, and assist to scoot to EOB   Transfers Overall transfer level: Needs assistance Equipment used: 2 person hand held assist Transfers: Sit to/from Stand;Lateral/Scoot Transfers Sit to Stand: +2 physical assistance;Mod assist        Lateral/Scoot Transfers: Max assist (+3 with bed pad) General transfer comment: pt with impaired ability to fully stand upright; stood ~3/4 (pt unable to fully extend to upright-even); lateral scoot along EOB to his right with poor ability to assist (?due to scrotal edema/pain)    Balance Overall balance assessment: Needs assistance Sitting-balance support: Single extremity supported;Feet supported Sitting balance-Leahy Scale: Poor Sitting balance - Comments: min to modA for static sitting Postural control: Posterior lean;Right lateral lean Standing balance support: Bilateral upper extremity supported Standing balance-Leahy Scale: Zero                             ADL either performed or assessed with clinical judgement   ADL Overall ADL's : Needs assistance/impaired                 Upper Body Dressing : Maximal assistance;Bed level Upper Body Dressing Details (indicate cue type and reason): donning new gown  Lower Body Dressing: Total assistance;Bed level Lower Body Dressing Details (indicate cue type and reason): donning socks, sit to stand max assist +2    Toilet Transfer Details (indicate cue type and reason):  lateral scoots up EOB with mod-max assist +2  Toileting- Clothing Manipulation and Hygiene: Total assistance;+2 for physical assistance;Bed level Toileting - Clothing Manipulation Details (indicate cue type and reason): for hygiene      Functional mobility during ADLs: Maximal assistance;+2 for physical assistance;+2  for safety/equipment;Moderate assistance General ADL Comments: patient issued and fit for new scrotal sling, left off patient due to frequent bowel incontinence; encouraged and positioned scrotal elevation with pad roll and pillowcase supine in bed-- RN and NT education, sign in room for scrotal sling and hanging on closet door     Vision       Perception     Praxis      Cognition Arousal/Alertness: Awake/alert Behavior During Therapy: WFL for tasks assessed/performed Overall Cognitive Status: Difficult to assess Area of Impairment: Following commands                       Following Commands: Follows one step commands consistently       General Comments: patient following 1 step commands consistently during session, with increased time; difficult to assess due to expression         Exercises     Shoulder Instructions       General Comments      Pertinent Vitals/ Pain       Pain Assessment: Faces Faces Pain Scale: Hurts whole lot Pain Location: scrotum Pain Descriptors / Indicators: Grimacing Pain Intervention(s): Limited activity within patient's tolerance;Monitored during session;Repositioned  Home Living Family/patient expects to be discharged to:: Private residence Living Arrangements: Spouse/significant other;Other (Comment) Available Help at Discharge: Family;Available PRN/intermittently Type of Home: House Home Access: Stairs to enter [employer] of Steps: 2 steps at side entrance from car port with no railing, 3 Stairs in back entrance to deck with one railing, 5 Steps to front entrance with bilateral railing.   Home Layout: One level     Bathroom Shower/Tub: Tub/shower unit         Home Equipment: Cane - single point   Additional Comments: spouse via phone states that her sister ordered pt SPC, but that he doesn't use. No other equipment. (info gleaned from chart)  Lives With: Spouse    Prior Functioning/Environment Level of  Independence: Independent        Comments: Pt's spouse via phone indicates that he was completely Indep, working nights at [employer]" standing/walking/active work. Has called out x1wk d/t L LE pain.(info gleaned from chart review)   Frequency  Min 2X/week        Progress Toward Goals  OT Goals(current goals can now be found in the care plan section)  Progress towards OT goals: Progressing toward goals  Acute Rehab OT Goals Patient Stated Goal: none stated OT Goal Formulation: With patient  Plan Discharge plan remains appropriate;Frequency remains appropriate    Co-evaluation    PT/OT/SLP Co-Evaluation/Treatment: Yes Reason for Co-Treatment: Complexity of the patient's impairments (multi-system involvement);For patient/therapist safety;To address functional/ADL transfers   OT goals addressed during session: ADL's and self-care      AM-PAC OT "6 Clicks" Daily Activity     Outcome Measure   Help from another person eating meals?: A Little Help from another person taking care of personal grooming?: A Lot Help from another person toileting, which includes using toliet, bedpan, or urinal?: Total Help from another person bathing (including washing, rinsing, drying)?: A Lot Help from another person to put on and taking off regular upper body clothing?: A Lot Help  from another person to put on and taking off regular lower body clothing?: Total 6 Click Score: 11    End of Session Equipment Utilized During Treatment: Gait belt  OT Visit Diagnosis: Unsteadiness on feet (R26.81);Cognitive communication deficit (R41.841);Hemiplegia and hemiparesis Symptoms and signs involving cognitive functions: Cerebral infarction Hemiplegia - Right/Left: Right Hemiplegia - dominant/non-dominant: Dominant Hemiplegia - caused by: Cerebral infarction   Activity Tolerance Patient limited by pain (scrotal pain and edema )   Patient Left in bed;with call bell/phone within reach;with bed alarm  set;with nursing/sitter in room   Nurse Communication Mobility status;Other (comment) (scrotal sling/elevation of scrotum education )        Time: 7253-6644 OT Time Calculation (min): 39 min  Charges: OT General Charges $OT Visit: 1 Visit OT Treatments $Self Care/Home Management : 23-37 mins  Barry Brunner, OT Acute Rehabilitation Services Pager [phone] Office [phone]    Chancy Milroy 03/04/2020, 3:24 PM

## 2020-03-04 NOTE — Progress Notes (Signed)
Physical Therapy Treatment Patient Details Name: Jonathan [name] MRN: [id number] DOB: 02-Nov-1960 Today's Date: 03/04/2020    History of Present Illness Pt is 59 y/o M with PMH: DDD, HTN, HLD, h/o CVA with some acute aphasia (wife reports his speech was normal before this admission), and PAD s/p stent in 2017. Presented with L LE pain. Now s/p angio with PCI and stent placement on 8/6 and revascularization of L LE on 8/7. Extubated 02/23/20. Of note, during OT assessment, significant R sided weakness and significant aphasia present. OT notified RN immediately who called code stroke.    PT Comments    Patient seen in co-treatment with OT to address scrotal sling management and mobility. (See OT note re: fabrication of scrotal sling). Patient with some return to RLE (both flexion and extension) however is not able to repeatedly activate muscles--does not appear to be inattention, ? pain related?  Currently mobility is limited by his scrotal edema and pain in addition to right hemiparesis. Patient could do well with stedy for pull to stand, however ?tolerate sitting on firm seat of steady. Pt returned to bed for scrotal elevation.    Follow Up Recommendations  SNF (family requesting therapies closer to home in SNF)     Equipment Recommendations  Other (comment) (TBA at next venue)    Recommendations for Other Services       Precautions / Restrictions Precautions Precautions: Fall Precaution Comments: scrotal sling provided Restrictions Weight Bearing Restrictions: No    Mobility  Bed Mobility Overal bed mobility: Needs Assistance Bed Mobility: Rolling;Sidelying to Sit;Sit to Sidelying Rolling: Mod assist;Min assist (with rail; max cues; min to his right, mod to his left) Sidelying to sit: Mod assist;+2 for physical assistance;HOB elevated     Sit to sidelying: Mod assist;+2 for physical assistance General bed mobility comments: Pt required assist to initiate Rt LE movement,  assist to lift trunk, and assist to scoot to EOB   Transfers Overall transfer level: Needs assistance Equipment used: 2 person hand held assist Transfers: Sit to/from Stand;Lateral/Scoot Transfers Sit to Stand: +2 physical assistance;Mod assist        Lateral/Scoot Transfers: Max assist (+3 with bed pad) General transfer comment: pt with impaired ability to fully stand upright; stood ~3/4 (pt unable to fully extend to upright-even); lateral scoot along EOB to his right with poor ability to assist (?due to scrotal edema/pain)  Ambulation/Gait                 Stairs             Wheelchair Mobility    Modified Rankin (Stroke Patients Only) Modified Rankin (Stroke Patients Only) Pre-Morbid Rankin Score: No symptoms Modified Rankin: Severe disability     Balance Overall balance assessment: Needs assistance Sitting-balance support: Single extremity supported;Feet supported Sitting balance-Leahy Scale: Poor Sitting balance - Comments: min to modA for static sitting Postural control: Posterior lean;Right lateral lean Standing balance support: Bilateral upper extremity supported Standing balance-Leahy Scale: Zero                              Cognition Arousal/Alertness: Awake/alert Behavior During Therapy: WFL for tasks assessed/performed Overall Cognitive Status: Difficult to assess Area of Impairment: Following commands                       Following Commands: Follows one step commands consistently  Exercises Other Exercises Other Exercises: AAROM RLE with pt able to activate hip flexors and hip/knee extensors x 1, however pt could not repeat despite multiple other attempts    General Comments        Pertinent Vitals/Pain Pain Assessment: Faces Faces Pain Scale: Hurts whole lot Pain Location: scrotum Pain Descriptors / Indicators: Grimacing Pain Intervention(s): Limited activity within patient's  tolerance;Monitored during session;Repositioned    Home Living Family/patient expects to be discharged to:: Private residence Living Arrangements: Spouse/significant other;Other (Comment) Available Help at Discharge: Family;Available PRN/intermittently Type of Home: House Home Access: Stairs to enter   Home Layout: One level Home Equipment: Gilmer Mor - single point Additional Comments: spouse via phone states that her sister ordered pt SPC, but that he doesn't use. No other equipment. (info gleaned from chart)    Prior Function Level of Independence: Independent      Comments: Pt's spouse via phone indicates that he was completely Indep, working nights at [employer]" standing/walking/active work. Has called out x1wk d/t L LE pain.(info gleaned from chart review)   PT Goals (current goals can now be found in the care plan section) Acute Rehab PT Goals Patient Stated Goal: none stated Time For Goal Achievement: 03/12/20 Potential to Achieve Goals: Fair Progress towards PT goals: Progressing toward goals    Frequency    Min 3X/week      PT Plan Discharge plan needs to be updated    Co-evaluation PT/OT/SLP Co-Evaluation/Treatment: Yes Reason for Co-Treatment: Complexity of the patient's impairments (multi-system involvement);Other (comment) (assist with donning scrotal sling) PT goals addressed during session: Mobility/safety with mobility;Other (comment) (educated on use of scrotal sling by OT)        AM-PAC PT "6 Clicks" Mobility   Outcome Measure  Help needed turning from your back to your side while in a flat bed without using bedrails?: A Lot Help needed moving from lying on your back to sitting on the side of a flat bed without using bedrails?: A Lot Help needed moving to and from a bed to a chair (including a wheelchair)?: Total Help needed standing up from a chair using your arms (e.g., wheelchair or bedside chair)?: Total Help needed to walk in hospital room?:  Total Help needed climbing 3-5 steps with a railing? : Total 6 Click Score: 8    End of Session Equipment Utilized During Treatment: Gait belt Activity Tolerance: Patient limited by pain Patient left: with call bell/phone within reach;in bed;with bed alarm set Nurse Communication: Mobility status;Other (comment) (need to elevate scrotum) PT Visit Diagnosis: Unsteadiness on feet (R26.81);Other abnormalities of gait and mobility (R26.89);Muscle weakness (generalized) (M62.81);Difficulty in walking, not elsewhere classified (R26.2);Other symptoms and signs involving the nervous system (R29.898);Pain Pain - part of body:  (scrotum)     Time: 1751-0258 PT Time Calculation (min) (ACUTE ONLY): 39 min  Charges:  $Neuromuscular Re-education: 8-22 mins                      Jerolyn Center, PT Pager [phone]    Zena Amos 03/04/2020, 11:56 AM

## 2020-03-04 NOTE — Progress Notes (Addendum)
Progress Note    03/04/2020 7:30 AM POD 11 Subjective:  Trying desperately to communicate. Following commands.   Vitals:   03/04/20 0021 03/04/20 0408  BP: 127/68 136/84  Pulse: 61 66  Resp: 18 20  Temp: 98.7 F (37.1 C) 99.2 F (37.3 C)  SpO2: 95% 95%    Physical Exam: Cardiac:  RRR Lungs:  nonlabored Incisions:  Groin and lower extremity incisions all healing without signs of infection or drainage Extremities:  Right hemiparesis. 2+ bil DP pulses Genitalia: moderate edema, minimal ecchymosis   CBC    Component Value Date/Time   WBC 12.2 (H) 03/04/2020 0137   RBC 3.45 (L) 03/04/2020 0137   HGB 10.4 (L) 03/04/2020 0137   HGB 16.8 08/14/2012 1008   HCT 32.5 (L) 03/04/2020 0137   HCT 49.0 08/14/2012 1008   PLT 361 03/04/2020 0137   PLT 291 08/14/2012 1008   MCV 94.2 03/04/2020 0137   MCV 92 08/14/2012 1008   MCH 30.1 03/04/2020 0137   MCHC 32.0 03/04/2020 0137   RDW 12.7 03/04/2020 0137   RDW 13.5 08/14/2012 1008   LYMPHSABS 1.4 03/04/2020 0137   MONOABS 1.1 (H) 03/04/2020 0137   EOSABS 0.1 03/04/2020 0137   BASOSABS 0.0 03/04/2020 0137    BMET    Component Value Date/Time   NA 137 03/04/2020 0137   NA 138 08/14/2012 1008   K 3.5 03/04/2020 0137   K 3.9 08/14/2012 1008   CL 104 03/04/2020 0137   CL 103 08/14/2012 1008   CO2 25 03/04/2020 0137   CO2 27 08/14/2012 1008   GLUCOSE 138 (H) 03/04/2020 0137   GLUCOSE 84 08/14/2012 1008   BUN 17 03/04/2020 0137   BUN 10 08/14/2012 1008   CREATININE 0.65 03/04/2020 0137   CREATININE 0.70 08/14/2012 1008   CALCIUM 8.5 (L) 03/04/2020 0137   CALCIUM 9.3 08/14/2012 1008   GFRNONAA >60 03/04/2020 0137   GFRNONAA >60 08/14/2012 1008   GFRAA >60 03/04/2020 0137   GFRAA >60 08/14/2012 1008     Intake/Output Summary (Last 24 hours) at 03/04/2020 0730 Last data filed at 03/04/2020 0600 Gross per 24 hour  Intake 100 ml  Output 700 ml  Net -600 ml    HOSPITAL MEDICATIONS Scheduled Meds: . amLODipine  10  mg Oral Daily  . apixaban  5 mg Oral BID  . atorvastatin  80 mg Oral Daily  . chlorhexidine  15 mL Mouth Rinse BID  . Chlorhexidine Gluconate Cloth  6 each Topical Daily  . clopidogrel  75 mg Oral Daily  . folic acid  1 mg Oral Daily  . guaiFENesin  600 mg Oral BID  . lisinopril  40 mg Oral Daily  . mouth rinse  15 mL Mouth Rinse q12n4p  . metoprolol tartrate  75 mg Oral BID  . nicotine  21 mg Transdermal Q2000  . pantoprazole  40 mg Oral Q2000  . potassium chloride  40 mEq Oral Daily   Continuous Infusions: . sodium chloride Stopped (02/25/20 1233)   PRN Meds:.sodium chloride, acetaminophen, albuterol, docusate sodium, ipratropium-albuterol, labetalol, ondansetron (ZOFRAN) IV, polyethylene glycol  Assessment: POD 11 bil iliofemoral thrombophlebectomy, left BK exposure and LLE fasciotomies. Both lower extremities well perfused.  Much improvement in genital edema.   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 On Plavix, apixaban -DVT prophylaxis:  Plexipulse   Wendi Maya, PA-C [facility] [phone] 03/04/2020  7:30 AM    I agree with  the above.  Stable for d/c from vascular perspective   Durene Cal

## 2020-03-05 NOTE — Progress Notes (Signed)
Physical Therapy Treatment Patient Details Name: SANUEL LADNIER MRN: [id number] DOB: July 28, 1960 Today's Date: 03/05/2020    History of Present Illness Pt is 59 y/o M with PMH: DDD, HTN, HLD, h/o CVA with some acute aphasia (wife reports his speech was normal before this admission), and PAD s/p stent in 2017. Presented with L LE pain. Now s/p angio with PCI and stent placement on 8/6 and revascularization of L LE on 8/7. Extubated 02/23/20. Of note, during OT assessment, significant R sided weakness and significant aphasia present. OT notified RN immediately who called code stroke.    PT Comments    On arrival to room pt agreeable to participate with therapy. He's making significant attempts to verbalize needs, but continues to struggle and seems frustrated at times. While preparing for transition to EOB, noted bed soiled from BM. Total assist provided for peri care. Pt continued to actively have BM after peri care was provided and was placed on a bed pan to finish. Further mobility deferred this session. Will continue to follow acutely.      Follow Up Recommendations  SNF (family requesting therapies closer to home in SNF)     Equipment Recommendations  Other (comment) (TBA at next venue)    Recommendations for Other Services       Precautions / Restrictions Precautions Precautions: Fall Precaution Comments: scrotal sling in room with instructions    Mobility  Bed Mobility Overal bed mobility: Needs Assistance Bed Mobility: Rolling Rolling: Mod assist;Min assist (with rail; max cues; min to his right, mod to his left)         General bed mobility comments: min A to roll R, mod to roll L. Multimodal cues for technique  Transfers                    Ambulation/Gait                 Stairs             Wheelchair Mobility    Modified Rankin (Stroke Patients Only) Modified Rankin (Stroke Patients Only) Pre-Morbid Rankin Score: No symptoms Modified  Rankin: Severe disability     Balance                                            Cognition Arousal/Alertness: Awake/alert Behavior During Therapy: WFL for tasks assessed/performed Overall Cognitive Status: Difficult to assess Area of Impairment: Following commands                       Following Commands: Follows one step commands consistently     Problem Solving: Difficulty sequencing;Requires verbal cues;Requires tactile cues General Comments: patient following 1 step commands consistently during session, with increased time; difficult to assess due to expression       Exercises General Exercises - Lower Extremity Ankle Circles/Pumps: PROM;Right;10 reps;Supine Heel Slides: PROM;Right;10 reps;Supine    General Comments        Pertinent Vitals/Pain Pain Assessment: No/denies pain    Home Living                      Prior Function            PT Goals (current goals can now be found in the care plan section) Acute Rehab PT Goals Patient Stated Goal: none stated PT Goal  Formulation: With patient Time For Goal Achievement: 03/12/20 Potential to Achieve Goals: Fair Progress towards PT goals: Progressing toward goals    Frequency    Min 3X/week      PT Plan Current plan remains appropriate    Co-evaluation              AM-PAC PT "6 Clicks" Mobility   Outcome Measure  Help needed turning from your back to your side while in a flat bed without using bedrails?: A Lot Help needed moving from lying on your back to sitting on the side of a flat bed without using bedrails?: A Lot Help needed moving to and from a bed to a chair (including a wheelchair)?: Total Help needed standing up from a chair using your arms (e.g., wheelchair or bedside chair)?: Total Help needed to walk in hospital room?: Total Help needed climbing 3-5 steps with a railing? : Total 6 Click Score: 8    End of Session Equipment Utilized During  Treatment: Gait belt Activity Tolerance: Other (comment) (actively having BM during session.) Patient left: with call bell/phone within reach;in bed;with bed alarm set Nurse Communication: Mobility status;Other (comment) (on bed pan) PT Visit Diagnosis: Unsteadiness on feet (R26.81);Other abnormalities of gait and mobility (R26.89);Muscle weakness (generalized) (M62.81);Difficulty in walking, not elsewhere classified (R26.2);Other symptoms and signs involving the nervous system (R29.898);Pain Pain - part of body:  (scrotum)     Time: 1610-9604 PT Time Calculation (min) (ACUTE ONLY): 27 min  Charges:  $Therapeutic Activity: 23-37 mins                     Kallie Locks, [place] Pager [phone] Acute Rehab    Sheral Apley 03/05/2020, 3:45 PM

## 2020-03-05 NOTE — Progress Notes (Signed)
[facility]  PROGRESS NOTE  Jonathan [name] ZOX:[id number] DOB: [birth year] DOA: 02/23/2020 PCP: Dione Housekeeper, MD Admit date - 02/23/2020   Admitting Physician Odette Fraction, MD  Outpatient Primary MD for the patient is Zada Finders Joycie Peek, MD  LOS - 11 Brief Narrative   Jonathan [name] is a 59 y.o. year old male with medical history significant for HTN, HLD, chronic smoking, significant peripheral artery disease who presented on 02/23/2020 worsening left leg pain for the past month and was admitted for high concern for acute limb ischemia, patient underwent emergent angiogram and stent placement and bilateral iliac arteries and external iliac arteries, course complicated by acute thrombosis of iliac stents requiring thrombectomy on 8/7 argatroban drip was also started due to concern for heparin-induced thrombocytopenia.  On 8/8 in setting of new slurred speech and right-sided weakness CT head was obtained which showed left-sided MCA he was transferred to [facility] for neuro ICU monitoring due to concern for malignant cerebral edema   Subjective  Today he has no complaints.  No acute events overnight.  Using incentive spirometry at bedside  A & P   Acute left MCA stroke, left ICA occlusion.  Large left MCA stroke with CT head, repeat CT head on 8/13 showed stable MCA and improved midline shift .  Continues to have right-sided hemiplegia and receptive/expressive aphasia -Continue Eliquis, Plavix and Lipitor  Cerebral edema in setting of above.  Briefly required hypertonic normal saline during hospital course that led to some hypernatremia.  No longer on hypertonic saline, sodium levels within normal limits, repeat CT scan shows stable cerebral edema without worsening midline shift   Acute limb ischemia, status post angioplasty and stent placement.  Complicated by thrombectomy/endarterectomy with fasciotomy -Continue Eliquis and Plavix  Concern for HIT, ruled out.   HIT panel negative. -Continue to monitor on Eliquis  Atrial fibrillation with RVR, new onset.  Currently rate controlled.  Briefly required Cardizem drip. -Normal rate on metoprolol 75 mg twice daily -TTE with preserved EF -Currently on Eliquis  Scrotal edema, stable -Continue to monitor -Scrotal sling  Elevated troponin, suspect demand ischemia troponin elevated 2000 2059 down to 2227, EF remains preserved with no wall motion abnormalities, and no ischemic changes on EKG.  Likely demand ischemia in the setting of A. fib and CVA  Dysphagia secondary to stroke.  No aspiration events in the hospital -Speech recommends dysphagia 1 diet with thin liquids -Aspiration precautions  HTN, stable -Continue metoprolol, lisinopril, amlodipine  HLD, stable -Continue Lipitor  Transaminitis, continue trending -Okay to continue Lipitor  Acute blood loss anemia in setting of operative intervention as mentioned above -Hemoglobin stable-transfuse if hemoglobin less than 7  Hypokalemia -Improved with oral replacement      Family Communication  : None  Code Status : Full  Disposition Plan  :  Patient is from home. Anticipated d/c date:  1-2 days. Barriers to d/c or necessity for inpatient status:  Medically stable for discharge, currently awaiting SNF placement Consults  : Vascular surgery, neurology  Procedures  :    DVT Prophylaxis  : Eliquis, Plavix  Lab Results  Component Value Date   PLT 361 03/04/2020    Diet :  Diet Order            DIET - DYS 1 Room service appropriate? No; Fluid consistency: Thin  Diet effective now                  Inpatient Medications Scheduled  Meds: . amLODipine  10 mg Oral Daily  . apixaban  5 mg Oral BID  . atorvastatin  80 mg Oral Daily  . chlorhexidine  15 mL Mouth Rinse BID  . Chlorhexidine Gluconate Cloth  6 each Topical Daily  . clopidogrel  75 mg Oral Daily  . feeding supplement (ENSURE ENLIVE)  237 mL Oral BID BM  . folic acid  1  mg Oral Daily  . guaiFENesin  600 mg Oral BID  . lisinopril  40 mg Oral Daily  . mouth rinse  15 mL Mouth Rinse q12n4p  . metoprolol tartrate  75 mg Oral BID  . multivitamin with minerals  1 tablet Oral Daily  . nicotine  21 mg Transdermal Q2000  . pantoprazole  40 mg Oral Q2000  . potassium chloride  40 mEq Oral Daily   Continuous Infusions: . sodium chloride Stopped (02/25/20 1233)   PRN Meds:.sodium chloride, acetaminophen, albuterol, docusate sodium, ipratropium-albuterol, labetalol, ondansetron (ZOFRAN) IV, polyethylene glycol  Antibiotics  :   Anti-infectives (From admission, onward)   None       Objective   Vitals:   03/05/20 0345 03/05/20 0421 03/05/20 0803 03/05/20 1100  BP:  135/71 139/64 135/76  Pulse:  62 68 92  Resp:  20 18 (!) 24  Temp:  97.7 F (36.5 C) 98.2 F (36.8 C) 98.2 F (36.8 C)  TempSrc:  Oral Oral Oral  SpO2:  95% 96% 99%  Weight: 83.9 kg       SpO2: 99 % O2 Flow Rate (L/min): 2 L/min FiO2 (%): 21 %  Wt Readings from Last 3 Encounters:  03/05/20 83.9 kg  02/21/20 79.6 kg  02/21/20 79.4 kg     Intake/Output Summary (Last 24 hours) at 03/05/2020 1409 Last data filed at 03/05/2020 1244 Gross per 24 hour  Intake 720 ml  Output 2250 ml  Net -1530 ml    Physical Exam:     Awake Alert, Oriented X 3, Normal affect Right sided hemiplegia, expressive/receptive aphasia present, follows commands with the left side Scrotal swelling present without tenderness [place].AT, Normal respiratory effort on room air, occasional wheezing heard RRR,No Gallops,Rubs or new Murmurs,  +ve B.Sounds, Abd Soft, No tenderness, No rebound, guarding or rigidity. Staples in place on the left lower anterior leg    I have personally reviewed the following:   Data Reviewed:  CBC Recent Labs  Lab 02/28/20 0437 02/29/20 0305 03/01/20 0803 03/03/20 0153 03/04/20 0137  WBC 12.1* 11.3* 11.3* 11.7* 12.2*  HGB 11.0* 10.5* 10.9* 10.5* 10.4*  HCT 36.1* 32.7*  33.0* 32.4* 32.5*  PLT 229 274 317 330 361  MCV 100.6* 96.7 94.6 94.2 94.2  MCH 30.6 31.1 31.2 30.5 30.1  MCHC 30.5 32.1 33.0 32.4 32.0  RDW 14.1 13.3 12.9 12.5 12.7  LYMPHSABS  --   --  1.5 1.2 1.4  MONOABS  --   --  1.0 1.0 1.1*  EOSABS  --   --  0.1 0.1 0.1  BASOSABS  --   --  0.0 0.0 0.0    Chemistries  Recent Labs  Lab 02/28/20 1332 02/29/20 0305 03/01/20 0803 03/03/20 0153 03/04/20 0137  NA 150* 143 141 136 137  K 3.5 3.0* 3.1* 3.2* 3.5  CL 113* 107 106 103 104  CO2 27 27 26 25 25   GLUCOSE 134* 135* 131* 135* 138*  BUN 21* 18 13 10 17   CREATININE 0.69 0.68 0.68 0.64 0.65  CALCIUM 8.9 8.6* 8.5* 8.4* 8.5*  MG  --   --  2.1  --   --   AST  --   --  35  --   --   ALT  --   --  62*  --   --   ALKPHOS  --   --  43  --   --   BILITOT  --   --  0.9  --   --    ------------------------------------------------------------------------------------------------------------------ No results for input(s): CHOL, HDL, LDLCALC, TRIG, CHOLHDL, LDLDIRECT in the last 72 hours.  Lab Results  Component Value Date   HGBA1C 6.2 (H) 02/23/2020   ------------------------------------------------------------------------------------------------------------------ No results for input(s): TSH, T4TOTAL, T3FREE, THYROIDAB in the last 72 hours.  Invalid input(s): FREET3 ------------------------------------------------------------------------------------------------------------------ No results for input(s): VITAMINB12, FOLATE, FERRITIN, TIBC, IRON, RETICCTPCT in the last 72 hours.  Coagulation profile No results for input(s): INR, PROTIME in the last 168 hours.  No results for input(s): DDIMER in the last 72 hours.  Cardiac Enzymes No results for input(s): CKMB, TROPONINI, MYOGLOBIN in the last 168 hours.  Invalid input(s): CK ------------------------------------------------------------------------------------------------------------------ No results found for: BNP  Micro Results No  results found for this or any previous visit (from the past 240 hour(s)).  Radiology Reports CT HEAD WO CONTRAST  Result Date: 02/28/2020 CLINICAL DATA:  Stroke, follow-up. EXAM: CT HEAD WITHOUT CONTRAST TECHNIQUE: Contiguous axial images were obtained from the base of the skull through the vertex without intravenous contrast. COMPARISON:  CT head without contrast 02/26/2020, 02/24/2020, and 02/23/2019. FINDINGS: Brain: Large left MCA territory nonhemorrhagic infarct is again noted. Infarct involves the left lentiform nucleus and caudate head. Mass effect is again noted. Midline shift is stable to slightly improved at 4.5 mm. Partial effacement of left lateral ventricle is again noted. Sulcal effacement is stable. No significant extra-axial hemorrhage is present. No significant right-sided infarcts are present. Cerebellar atrophy is stable. No acute infarcts are present in the posterior fossa. Vascular: Atherosclerotic changes are present within the cavernous internal carotid arteries bilaterally. No hyperdense vessel is present. Skull: Calvarium is intact. No focal lytic or blastic lesions are present. No significant extracranial soft tissue lesion is present. Sinuses/Orbits: The paranasal sinuses and mastoid air cells are clear. Remote right orbital blowout fracture is stable. Globes and orbits are otherwise within normal limits. IMPRESSION: 1. Stable appearance of large left MCA territory nonhemorrhagic infarct. 2. Stable to slightly improved midline shift. 3. No significant extra-axial hemorrhage. 4. Stable atrophy and white matter disease. Electronically Signed   By: Marin Roberts M.D.   On: 02/28/2020 06:33   CT HEAD WO CONTRAST  Result Date: 02/26/2020 CLINICAL DATA:  Follow-up examination for acute stroke. EXAM: CT HEAD WITHOUT CONTRAST TECHNIQUE: Contiguous axial images were obtained from the base of the skull through the vertex without intravenous contrast. COMPARISON:  Prior CT from  02/24/2020. FINDINGS: Brain: Continued interval evolution of large left MCA territory infarct is seen, overall stable in size and distribution from previous. Associated regional mass effect with partial effacement of the left lateral ventricle and up to 5 mm of left-to-right shift, slightly worsened from previous. No hydrocephalus or ventricular trapping. Basilar cisterns remain patent. No evidence for hemorrhagic transformation. No other new acute large vessel territory infarct. No intracranial hemorrhage. No extra-axial fluid collection. Vascular: No hyperdense vessel. Scattered vascular calcifications noted within the carotid siphons. Skull: Scalp soft tissues within normal limits. Calvarium unchanged. Sinuses/Orbits: Globes and orbital soft tissues demonstrate no acute finding. Paranasal sinuses and mastoid air cells remain largely clear. Other: None. IMPRESSION: 1. Continued interval evolution of  large left MCA territory infarct, overall stable in size and distribution from previous. Associated regional mass effect with up to 5 mm of left-to-right shift, slightly worsened from previous. No hydrocephalus or ventricular trapping. No evidence for hemorrhagic transformation. 2. No other new acute intracranial abnormality. Electronically Signed   By: Rise Mu M.D.   On: 02/26/2020 02:07   CT HEAD WO CONTRAST  Result Date: 02/24/2020 CLINICAL DATA:  Stroke follow-up EXAM: CT HEAD WITHOUT CONTRAST TECHNIQUE: Contiguous axial images were obtained from the base of the skull through the vertex without intravenous contrast. COMPARISON:  02/23/2020 head CT. FINDINGS: Brain: Sequela of evolving left MCA territory infarct. 3-4 mm rightward midline shift is unchanged. No intracranial hemorrhage. No mass lesion. No ventriculomegaly or extra-axial fluid collection. Vascular: No hyperdense vessel. Bilateral skull base atherosclerotic calcifications. Skull: Negative for fracture or focal lesion. Sinuses/Orbits:  Normal orbits. Soft tissue impacted left maxillary molar, unchanged. Mild right sphenoid sinus mucosal thickening. No mastoid effusion. Other: None. IMPRESSION: Evolving left MCA territory infarct with unchanged 3-4 mm rightward midline shift. No intracranial hemorrhage. Electronically Signed   By: Stana Bunting M.D.   On: 02/24/2020 09:29   CT HEAD WO CONTRAST  Result Date: 02/23/2020 CLINICAL DATA:  Stroke follow-up EXAM: CT HEAD WITHOUT CONTRAST TECHNIQUE: Contiguous axial images were obtained from the base of the skull through the vertex without intravenous contrast. COMPARISON:  Head CT 02/21/2020 FINDINGS: Brain: Large area of cytotoxic edema throughout the left MCA territory. No acute hemorrhage. 3 mm rightward midline shift. Vascular: Atherosclerotic calcification of the internal carotid arteries at the skull base. Skull: Normal. Negative for fracture or focal lesion. Sinuses/Orbits: No acute finding. Other: None. IMPRESSION: 1. Slightly increased cytotoxic edema within the left MCA territory. 2. No acute hemorrhage. 3. 3 mm rightward midline shift. Electronically Signed   By: Deatra Robinson M.D.   On: 02/23/2020 22:00   US Carotid Bilateral  Result Date: 02/23/2020 CLINICAL DATA:  Slurred speech.  Carotid stenosis. EXAM: BILATERAL CAROTID DUPLEX ULTRASOUND TECHNIQUE: Wallace Cullens scale imaging, color Doppler and duplex ultrasound were performed of bilateral carotid and vertebral arteries in the neck. COMPARISON:  CTA neck 02/23/2020 FINDINGS: Criteria: Quantification of carotid stenosis is based on velocity parameters that correlate the residual internal carotid diameter with NASCET-based stenosis levels, using the diameter of the distal internal carotid lumen as the denominator for stenosis measurement. The following velocity measurements were obtained: RIGHT ICA: 117/63 cm/sec CCA: 130/38 cm/sec SYSTOLIC ICA/CCA RATIO:  0.9 ECA: 220 cm/sec LEFT ICA: Occluded CCA: 75/10 cm/sec ECA: 195 cm/sec RIGHT  CAROTID ARTERY: Mild atherosclerotic disease in the right common carotid artery. Heterogeneous plaque at the right carotid bulb. External carotid artery is patent with normal waveform. Atherosclerotic plaque in the proximal internal carotid artery. Normal waveforms and velocities in the internal carotid artery. RIGHT VERTEBRAL ARTERY: Antegrade flow and normal waveform in the right vertebral artery. LEFT CAROTID ARTERY: Left common carotid artery is patent with intimal thickening. Left carotid artery is patent. External carotid artery is patent with normal waveform. 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 No flow in the mid or distal left internal carotid artery. LEFT VERTEBRAL ARTERY: Antegrade flow and normal waveform in the left vertebral artery. IMPRESSION: 1. 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 Findings correspond with recent CTA findings. 2. Atherosclerotic plaque involving the carotid arteries. Estimated degree of stenosis in the right internal carotid artery is less than 50%. 3. Patent vertebral arteries with antegrade flow. Electronically Signed  By: Richarda Overlie M.D.   On: 02/23/2020 14:42   PERIPHERAL VASCULAR CATHETERIZATION  Result Date: 02/21/2020 See op note  CT CEREBRAL PERFUSION W CONTRAST  Result Date: 02/23/2020 CLINICAL DATA:  Neuro deficit, acute, stroke suspected. EXAM: CT ANGIOGRAPHY HEAD AND NECK CT PERFUSION BRAIN TECHNIQUE: Multidetector CT imaging of the head and neck was performed using the standard protocol during bolus administration of intravenous contrast. Multiplanar CT image reconstructions and MIPs were obtained to evaluate the vascular anatomy. Carotid stenosis measurements (when applicable) are obtained utilizing NASCET criteria, using the distal internal carotid diameter as the denominator. Multiphase CT imaging of the brain was performed following IV bolus contrast injection. Subsequent parametric perfusion maps were calculated using  RAPID software. CONTRAST:  OMNIPAQUE IOHEXOL 350 MG/ML SOLN COMPARISON:  Noncontrast head CT performed earlier the same day. FINDINGS: CTA NECK FINDINGS Aortic arch: The left vertebral artery arises directly from the aortic arch. Atherosclerotic plaque within the visualized aortic arch and proximal major branch vessels of the neck. No hemodynamically significant innominate or proximal subclavian artery stenosis. Right carotid system: The CCA and ICA are patent within the neck. Atherosclerotic plaque within these vessels. Most notably there is at least moderate calcified plaque within the carotid bifurcation. Exact quantification of stenosis within the proximal right ICA is difficult due to motion artifact and blooming artifact of calcified plaque at this site. Additionally, there is 50-60% stenosis of the mid cervical ICA. Left carotid system: The CCA is patent to the bifurcation. 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 Vertebral arteries: The dominant right vertebral artery is patent throughout the neck. Moderate/severe atherosclerotic narrowing within the V1 right vertebral artery. The non dominant left vertebral artery is developmentally diminutive, but patent throughout the neck Skeleton: Reversal of the expected cervical lordosis. Cervical spondylosis with multilevel disc space narrowing, posterior disc osteophytes and uncovertebral hypertrophy. Other neck: No neck mass or cervical lymphadenopathy. Upper chest: No consolidation within the imaged lung apices. 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 There is minimal reconstitution of flow within the supraclinoid left ICA. The M1 and M2 left middle cerebral arteries are patent, although with  asymmetrically decreased opacification as compared to the right side. Additionally, there is a moderate to moderately severe focal stenosis within a superior division proximal M2 left MCA branch (series 8, image 17). The intracranial right internal carotid artery is patent. However, there are sites of severe stenosis within the cavernous and paraclinoid segments. The M1 right middle cerebral artery is patent without significant stenosis. No right M2 proximal branch occlusion is identified. There is a moderate to moderately severe stenosis within an inferior division mid M2 right MCA branch vessel (series 9, image 19). The A1 left anterior cerebral artery is hypoplastic. The A1 right anterior cerebral artery is dominant. The A2 and more distal anterior cerebral arteries are patent. However, there are multifocal high-grade stenoses within the bilateral A2 anterior cerebral arteries. Posterior circulation: The non dominant intracranial left vertebral artery is developmentally diminutive, but patent and terminates as the left PICA. The dominant intracranial right vertebral artery is patent, although with sites of moderate stenosis. The basilar artery is patent. The posterior cerebral arteries are patent. However, there is a high-grade focal stenosis within the P2 left PCA and high-grade focal  stenosis within the right PCA at the P2/P3 junction. Venous sinuses: Within limitations of contrast timing, no convincing thrombus. Anatomic variants: Posterior communicating arteries are hypoplastic or absent bilaterally. Review of the MIP images confirms the above findings CT Brain Perfusion Findings: ASPECTS: 4 CBF (<30%) Volume: 89mL (left MCA vascular territory) Perfusion (Tmax>6.0s) volume: (left MCA vascular territory) Mismatch Volume: 60mL Infarction Location:Left MCA vascular territory. These results were called by telephone at the time of interpretation on 02/23/2020 at 12:40 pm to provider [facility] , who  verbally acknowledged these results. IMPRESSION: CTA neck: 1. 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 2. There is at least moderate calcified plaque at the right carotid bifurcation. There is stenosis at the origin of the cervical right vertebral artery which is poorly quantified due to motion degradation and blooming artifact from calcified plaque at this site. Carotid artery duplex is recommended for further evaluation. Additionally, there is apparent 50-60% stenosis of the mid cervical right ICA. 3. Moderate/severe atherosclerotic narrowing within the Dom V1 right vertebral artery. 4. The left vertebral artery is developmentally diminutive, but patent throughout the neck. CTA head: 1. The left internal carotid artery remains occluded intracranially throughout the siphon region. There is minimal reconstitution of flow within the supraclinoid left ICA. The M1 and M2 left middle cerebral arteries are patent, although with asymmetrically decreased opacification as compared to the right side. Additionally, there is a moderate to moderately severe focal stenosis within a superior division proximal M2 left MCA branch. 2. Additional intracranial atherosclerotic disease with multifocal stenoses, most notably as follows. 3. High-grade stenoses within the cavernous/paraclinoid right ICA. 4. Moderate to moderately severe focal stenosis within an inferior division mid M2 right MCA branch vessel. 5. Moderate stenosis within the V4 right vertebral artery. 6. High-grade stenosis within the P2 left PCA. 7. High-grade stenosis within the right PCA at the P2/P3 junction. 8. Multifocal high-grade stenoses within the A2 anterior cerebral arteries bilaterally. CT perfusion head: The perfusion software identifies an 89 mL core infarct within the left MCA vascular territory. The perfusion software identifies a 149 mL region of critically hypoperfused parenchyma within  the left MCA vascular territory. Reported mismatch volume 60 mL. Electronically Signed   By: Jackey Loge DO   On: 02/23/2020 12:53   DG CHEST PORT 1 VIEW  Result Date: 02/24/2020 CLINICAL DATA:  59 year old male with shortness of breath. EXAM: PORTABLE CHEST 1 VIEW COMPARISON:  Chest radiograph dated 02/22/2020. FINDINGS: Status post removal of the endotracheal and enteric tube. There is diffuse interstitial densities which may be chronic or represent edema or atypical pneumonia. No large pleural effusion. No pneumothorax. Stable cardiac silhouette. No acute osseous pathology. IMPRESSION: Interval removal of the endotracheal and enteric tubes. Electronically Signed   By: Elgie Collard M.D.   On: 02/24/2020 19:11   Portable Chest x-ray  Result Date: 02/22/2020 CLINICAL DATA:  Left cerebral artery and polyp stroke, postop bilateral iliofemoral thrombectomy, ETT EXAM: PORTABLE CHEST 1 VIEW COMPARISON:  01/12/2013 FINDINGS: Lungs are essentially clear.  No pleural effusion or pneumothorax. The heart is normal in size. Endotracheal tube terminates 5 cm above the carina. Enteric tube terminates in the proximal gastric body. IMPRESSION: Endotracheal tube terminates 5 cm above the carina. Enteric tube terminates in the proximal gastric body. Electronically Signed   By: Charline Bills M.D.   On: 02/22/2020 08:54   DG Swallowing Func-Speech Pathology  Result Date: 02/28/2020 Objective Swallowing  Evaluation: Type of Study: MBS-Modified Barium Swallow Study  Patient Details Name: PARNELL SPIELER MRN: [id number] Date of Birth: 12/18/1960 Today's Date: 02/28/2020 Time: SLP Start Time (ACUTE ONLY): 1457 -SLP Stop Time (ACUTE ONLY): 1513 SLP Time Calculation (min) (ACUTE ONLY): 16 min Past Medical History: Past Medical History: Diagnosis Date . Aphasia S/P CVA  . DDD (degenerative disc disease), cervical  . ED (erectile dysfunction)  . Hyperlipidemia  . Hypertension  . Peripheral arterial disease (HCC)  . Stroke  [facility])  Past Surgical History: Past Surgical History: Procedure Laterality Date . FEMORAL-FEMORAL BYPASS GRAFT Bilateral 02/22/2020  Procedure: bilateral ileofemoral thrombectomy, left popliteal exposure and thrombectomy, four compartment fasciotomy, left femoral endarterectomy with patch angioplasty;  Surgeon: Nada Libman, MD;  Location: [facility] ORS;  Service: Vascular;  Laterality: Bilateral; . LOWER EXTREMITY ANGIOGRAPHY Left 02/21/2020  Procedure: LOWER EXTREMITY ANGIOGRAPHY;  Surgeon: Renford Dills, MD;  Location: [facility] INVASIVE CV LAB;  Service: Cardiovascular;  Laterality: Left; . PERIPHERAL VASCULAR CATHETERIZATION N/A 07/21/2015  Procedure: Abdominal Aortogram w/Lower Extremity;  Surgeon: Renford Dills, MD;  Location: [facility] INVASIVE CV LAB;  Service: Cardiovascular;  Laterality: N/A; . PERIPHERAL VASCULAR CATHETERIZATION  07/21/2015  Procedure: Lower Extremity Intervention;  Surgeon: Renford Dills, MD;  Location: [facility] INVASIVE CV LAB;  Service: Cardiovascular;; HPI: 59 y.o. male with a history of hypertension, hyperlipidemia, CVA, severe PAD  who presented with acute critical limb ischemia secondary to occlusion of the iliac artery who underwent stenting on 8/6 at [facility].  He was taken back to the OR on 8/7 found to have acute thrombosis of bilateral iliac stents underwent thrombectomy and endarterectomy;  fasciotomies of the left leg. Extubated 8/8. Developed right hemiplegia and aphasia morning of 8/8. CTH: large territory infarct left MCA. Pt transferred to [facility] same day.   Subjective: alert, aphasic Assessment / Plan / Recommendation CHL IP CLINICAL IMPRESSIONS 02/28/2020 Clinical Impression Pt's oropharyngeal swallow is stable compared to MBS completed on 8/9 even with pt self-feeding throughout this study (see that note for more specifics regarding physiology). Pt was allowed to self-feed at his own rate, drinking cups of thin barium without stopping. Across multiple small cupfuls, there was no  aspiration. Trace penetration occurred x2 with thin liquids but cleared spontaneously as he continued to swallow additional boluses. Would continue Dys 1 (puree) diet and thin liquids with full supervision for safety.  SLP Visit Diagnosis Dysphagia, oropharyngeal phase (R13.12) Attention and concentration deficit following -- Frontal lobe and executive function deficit following -- Impact on safety and function Mild aspiration risk   CHL IP TREATMENT RECOMMENDATION 02/28/2020 Treatment Recommendations Therapy as outlined in treatment plan below   Prognosis 02/28/2020 Prognosis for Safe Diet Advancement Good Barriers to Reach Goals Language deficits Barriers/Prognosis Comment -- CHL IP DIET RECOMMENDATION 02/28/2020 SLP Diet Recommendations Dysphagia 1 (Puree) solids;Thin liquid Liquid Administration via Cup;Straw Medication Administration Whole meds with puree Compensations Minimize environmental distractions;Slow rate;Small sips/bites Postural Changes Seated upright at 90 degrees;Remain semi-upright after after feeds/meals (Comment)   CHL IP OTHER RECOMMENDATIONS 02/28/2020 Recommended Consults -- Oral Care Recommendations Oral care BID Other Recommendations Have oral suction available   CHL IP FOLLOW UP RECOMMENDATIONS 02/28/2020 Follow up Recommendations Inpatient Rehab   CHL IP FREQUENCY AND DURATION 02/28/2020 Speech Therapy Frequency (ACUTE ONLY) min 2x/week Treatment Duration 2 weeks      CHL IP ORAL PHASE 02/28/2020 Oral Phase Impaired Oral - Pudding Teaspoon -- Oral - Pudding Cup -- Oral - Honey Teaspoon -- Oral - Honey Cup --  Oral - Nectar Teaspoon -- Oral - Nectar Cup -- Oral - Nectar Straw NT Oral - Thin Teaspoon -- Oral - Thin Cup Right anterior bolus loss;Weak lingual manipulation;Reduced posterior propulsion;Holding of bolus;Right pocketing in lateral sulci;Pocketing in anterior sulcus;Lingual/palatal residue;Piecemeal swallowing;Decreased bolus cohesion Oral - Thin Straw Right anterior bolus loss;Weak  lingual manipulation;Reduced posterior propulsion;Holding of bolus;Right pocketing in lateral sulci;Pocketing in anterior sulcus;Lingual/palatal residue;Piecemeal swallowing;Decreased bolus cohesion Oral - Puree Right anterior bolus loss;Weak lingual manipulation;Reduced posterior propulsion;Holding of bolus;Right pocketing in lateral sulci;Pocketing in anterior sulcus;Lingual/palatal residue;Piecemeal swallowing;Decreased bolus cohesion Oral - Mech Soft -- Oral - Regular NT Oral - Multi-Consistency -- Oral - Pill -- Oral Phase - Comment --  CHL IP PHARYNGEAL PHASE 02/28/2020 Pharyngeal Phase Impaired Pharyngeal- Pudding Teaspoon -- Pharyngeal -- Pharyngeal- Pudding Cup -- Pharyngeal -- Pharyngeal- Honey Teaspoon -- Pharyngeal -- Pharyngeal- Honey Cup -- Pharyngeal -- Pharyngeal- Nectar Teaspoon -- Pharyngeal -- Pharyngeal- Nectar Cup -- Pharyngeal -- Pharyngeal- Nectar Straw NT Pharyngeal -- Pharyngeal- Thin Teaspoon -- Pharyngeal -- Pharyngeal- Thin Cup Delayed swallow initiation-pyriform sinuses;Delayed swallow initiation-vallecula Pharyngeal -- Pharyngeal- Thin Straw Delayed swallow initiation-pyriform sinuses;Delayed swallow initiation-vallecula Pharyngeal -- Pharyngeal- Puree Delayed swallow initiation-pyriform sinuses;Delayed swallow initiation-vallecula Pharyngeal -- Pharyngeal- Mechanical Soft -- Pharyngeal -- Pharyngeal- Regular NT Pharyngeal -- Pharyngeal- Multi-consistency -- Pharyngeal -- Pharyngeal- Pill -- Pharyngeal -- Pharyngeal Comment --  CHL IP CERVICAL ESOPHAGEAL PHASE 02/28/2020 Cervical Esophageal Phase WFL Pudding Teaspoon -- Pudding Cup -- Honey Teaspoon -- Honey Cup -- Nectar Teaspoon -- Nectar Cup -- Nectar Straw -- Thin Teaspoon -- Thin Cup -- Thin Straw -- Puree -- Mechanical Soft -- Regular -- Multi-consistency -- Pill -- Cervical Esophageal Comment -- Mahala Menghini., M.A. CCC-SLP Acute Rehabilitation Services Pager [phone] Office [phone] 02/28/2020, 4:22 PM              DG  Swallowing Func-Speech Pathology  Result Date: 02/24/2020 Objective Swallowing Evaluation: Type of Study: MBS-Modified Barium Swallow Study  Patient Details Name: MENDY LAPINSKY MRN: [id number] Date of Birth: August 16, 1960 Today's Date: 02/24/2020 Time: SLP Start Time (ACUTE ONLY): 1310 -SLP Stop Time (ACUTE ONLY): 1340 SLP Time Calculation (min) (ACUTE ONLY): 30 min Past Medical History: Past Medical History: Diagnosis Date . Aphasia S/P CVA  . DDD (degenerative disc disease), cervical  . ED (erectile dysfunction)  . Hyperlipidemia  . Hypertension  . Peripheral arterial disease (HCC)  . Stroke [facility])  Past Surgical History: Past Surgical History: Procedure Laterality Date . FEMORAL-FEMORAL BYPASS GRAFT Bilateral 02/22/2020  Procedure: bilateral ileofemoral thrombectomy, left popliteal exposure and thrombectomy, four compartment fasciotomy, left femoral endarterectomy with patch angioplasty;  Surgeon: Nada Libman, MD;  Location: [facility] ORS;  Service: Vascular;  Laterality: Bilateral; . LOWER EXTREMITY ANGIOGRAPHY Left 02/21/2020  Procedure: LOWER EXTREMITY ANGIOGRAPHY;  Surgeon: Renford Dills, MD;  Location: [facility] INVASIVE CV LAB;  Service: Cardiovascular;  Laterality: Left; . PERIPHERAL VASCULAR CATHETERIZATION N/A 07/21/2015  Procedure: Abdominal Aortogram w/Lower Extremity;  Surgeon: Renford Dills, MD;  Location: [facility] INVASIVE CV LAB;  Service: Cardiovascular;  Laterality: N/A; . PERIPHERAL VASCULAR CATHETERIZATION  07/21/2015  Procedure: Lower Extremity Intervention;  Surgeon: Renford Dills, MD;  Location: [facility] INVASIVE CV LAB;  Service: Cardiovascular;; HPI: 59 y.o. male with a history of hypertension, hyperlipidemia, CVA, severe PAD  who presented with acute critical limb ischemia secondary to occlusion of the iliac artery who underwent stenting on 8/6 at [facility].  He was taken back to the OR on 8/7 found to have acute thrombosis of bilateral iliac stents underwent  thrombectomy and endarterectomy;   fasciotomies of the left leg. Extubated 8/8. Developed right hemiplegia and aphasia morning of 8/8. CTH: large territory infarct left MCA. Pt transferred to [facility] same day.   Subjective: alert, aphasic Assessment / Plan / Recommendation CHL IP CLINICAL IMPRESSIONS 02/24/2020 Clinical Impression Pt presents with a primary oral dysphagia c/b impaired bolus cohesion and control over release into the pharynx.  There was decreased sensation on the right, with barium residue in right lateral and anterior sulci and limited awareness of its presence.  Pharyngeal function was largely intact, with adequate pharyngeal squeeze, no residue post-swallow, and effective laryngeal vestibule closure.  Occasionally, thin and nectar thick barium reached the pyriforms for a delayed period of time before the swallow was triggered.  On other occasions, the swallow trigger was timely.  There was one incident of penetration to the vocal folds when a bolus of nectar thick liquid prematurely escaped the oral cavity and entered the laryngeal vestibule without eliciting a response.  Pt's impaired sensation appears to be the primary concern and puts him at greater risk of aspiration.  For now, recommend starting a dysphagia 1 diet, thin liquids from cup only - NO STRAWS; give meds whole in applesauce/pudding.  Due to pt's impulsivity, difficulty following commands, and poor sensation/awareness on right side of mouth/throat, he will require careful supervision during meals.  SLP will follow for safety/diet progression and therapeutic training.   SLP Visit Diagnosis Dysphagia, oropharyngeal phase (R13.12) Attention and concentration deficit following -- Frontal lobe and executive function deficit following -- Impact on safety and function Mild aspiration risk   CHL IP TREATMENT RECOMMENDATION 02/24/2020 Treatment Recommendations Therapy as outlined in treatment plan below   Prognosis 02/24/2020 Prognosis for Safe Diet Advancement Good Barriers to Reach  Goals -- Barriers/Prognosis Comment -- CHL IP DIET RECOMMENDATION 02/24/2020 SLP Diet Recommendations Dysphagia 1 (Puree) solids;Thin liquid Liquid Administration via Cup;No straw Medication Administration Whole meds with puree Compensations Minimize environmental distractions;Slow rate;Small sips/bites Postural Changes --   CHL IP OTHER RECOMMENDATIONS 02/24/2020 Recommended Consults -- Oral Care Recommendations Oral care BID Other Recommendations --   CHL IP FOLLOW UP RECOMMENDATIONS 02/24/2020 Follow up Recommendations Inpatient Rehab   CHL IP FREQUENCY AND DURATION 02/24/2020 Speech Therapy Frequency (ACUTE ONLY) min 3x week Treatment Duration 2 weeks      CHL IP ORAL PHASE 02/24/2020 Oral Phase Impaired Oral - Pudding Teaspoon -- Oral - Pudding Cup -- Oral - Honey Teaspoon -- Oral - Honey Cup -- Oral - Nectar Teaspoon -- Oral - Nectar Cup -- Oral - Nectar Straw Right anterior bolus loss;Weak lingual manipulation;Reduced posterior propulsion;Holding of bolus;Right pocketing in lateral sulci;Pocketing in anterior sulcus;Lingual/palatal residue;Piecemeal swallowing;Decreased bolus cohesion Oral - Thin Teaspoon -- Oral - Thin Cup Right anterior bolus loss;Weak lingual manipulation;Reduced posterior propulsion;Holding of bolus;Right pocketing in lateral sulci;Pocketing in anterior sulcus;Lingual/palatal residue;Piecemeal swallowing;Decreased bolus cohesion Oral - Thin Straw Right anterior bolus loss;Weak lingual manipulation;Reduced posterior propulsion;Holding of bolus;Right pocketing in lateral sulci;Pocketing in anterior sulcus;Lingual/palatal residue;Piecemeal swallowing;Decreased bolus cohesion Oral - Puree Right anterior bolus loss;Weak lingual manipulation;Reduced posterior propulsion;Holding of bolus;Right pocketing in lateral sulci;Pocketing in anterior sulcus;Lingual/palatal residue;Piecemeal swallowing;Decreased bolus cohesion Oral - Mech Soft -- Oral - Regular Right anterior bolus loss;Weak lingual  manipulation;Reduced posterior propulsion;Holding of bolus;Right pocketing in lateral sulci;Pocketing in anterior sulcus;Lingual/palatal residue;Piecemeal swallowing;Decreased bolus cohesion Oral - Multi-Consistency -- Oral - Pill -- Oral Phase - Comment --  CHL IP PHARYNGEAL PHASE 02/24/2020 Pharyngeal Phase Impaired Pharyngeal- Pudding Teaspoon -- Pharyngeal -- Pharyngeal-  Pudding Cup -- Pharyngeal -- Pharyngeal- Honey Teaspoon -- Pharyngeal -- Pharyngeal- Honey Cup -- Pharyngeal -- Pharyngeal- Nectar Teaspoon -- Pharyngeal -- Pharyngeal- Nectar Cup -- Pharyngeal -- Pharyngeal- Nectar Straw Delayed swallow initiation-pyriform sinuses;Delayed swallow initiation-vallecula;Penetration/Aspiration before swallow Pharyngeal Material enters airway, CONTACTS cords and not ejected out Pharyngeal- Thin Teaspoon -- Pharyngeal -- Pharyngeal- Thin Cup Delayed swallow initiation-pyriform sinuses;Delayed swallow initiation-vallecula Pharyngeal -- Pharyngeal- Thin Straw Delayed swallow initiation-vallecula;Delayed swallow initiation-pyriform sinuses Pharyngeal -- Pharyngeal- Puree Delayed swallow initiation-vallecula;Delayed swallow initiation-pyriform sinuses Pharyngeal -- Pharyngeal- Mechanical Soft -- Pharyngeal -- Pharyngeal- Regular Delayed swallow initiation-vallecula Pharyngeal -- Pharyngeal- Multi-consistency -- Pharyngeal -- Pharyngeal- Pill -- Pharyngeal -- Pharyngeal Comment --  CHL IP CERVICAL ESOPHAGEAL PHASE 02/24/2020 Cervical Esophageal Phase WFL Pudding Teaspoon -- Pudding Cup -- Honey Teaspoon -- Honey Cup -- Nectar Teaspoon -- Nectar Cup -- Nectar Straw -- Thin Teaspoon -- Thin Cup -- Thin Straw -- Puree -- Mechanical Soft -- Regular -- Multi-consistency -- Pill -- Cervical Esophageal Comment -- Blenda Mounts Laurice 02/24/2020, 2:58 PM              ECHOCARDIOGRAM COMPLETE  Result Date: 02/24/2020    ECHOCARDIOGRAM REPORT   Patient Name:   LEM PEARY Date of Exam: 02/24/2020 Medical Rec #:  [id number]           Height:       67.0 in Accession #:    [id number]         Weight:       175.5 lb Date of Birth:  1960/10/04          BSA:          1.913 m Patient Age:    59 years           BP:           159/96 mmHg Patient Gender: M                  HR:           106 bpm. Exam Location:  Inpatient Procedure: 2D Echo and Intracardiac Opacification Agent Indications:    Stroke 434.91 / I163.9  History:        Patient has no prior history of Echocardiogram examinations.                 Peripheral arterial disease, underwent stenting, complicated by                 thrombosis requiring thrombectomy and LLE fasciotomy. Developed                 left facial droop. 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  Sonographer:    Leta Jungling RDCS Referring Phys: [id number] JINDONG XU IMPRESSIONS  1. Left ventricular ejection fraction, by estimation, is 60 to 65%. Left ventricular ejection fraction by 2D MOD biplane is 63.2 %. The left ventricle has normal function. The left ventricle has no regional wall motion abnormalities. Left ventricular diastolic parameters are indeterminate.  2. Right ventricular systolic function is normal. The right ventricular size is normal. There is normal pulmonary artery systolic pressure. The estimated right ventricular systolic pressure is 30.9 mmHg.  3. The mitral valve is grossly normal. No evidence of mitral valve regurgitation. No evidence of mitral stenosis.  4. The aortic valve is tricuspid. Aortic valve regurgitation is not visualized. No aortic stenosis is present.  5. The inferior vena cava is normal in size with greater than 50% respiratory variability, suggesting right atrial pressure of  3 mmHg. Conclusion(s)/Recommendation(s): No intracardiac source of embolism detected on this transthoracic study. A transesophageal echocardiogram is recommended to exclude cardiac source of embolism if clinically indicated. FINDINGS  Left Ventricle: Left ventricular ejection fraction, by estimation, is 60 to 65%. Left ventricular  ejection fraction by 2D MOD biplane is 63.2 %. The left ventricle has normal function. The left ventricle has no regional wall motion abnormalities. Definity contrast agent was given IV to delineate the left ventricular endocardial borders. The left ventricular internal cavity size was normal in size. There is no left ventricular hypertrophy. Left ventricular diastolic parameters are indeterminate. Right Ventricle: The right ventricular size is normal. No increase in right ventricular wall thickness. Right ventricular systolic function is normal. There is normal pulmonary artery systolic pressure. The tricuspid regurgitant velocity is 2.64 m/s, and  with an assumed right atrial pressure of 3 mmHg, the estimated right ventricular systolic pressure is 30.9 mmHg. Left Atrium: Left atrial size was normal in size. Right Atrium: Right atrial size was normal in size. Pericardium: Trivial pericardial effusion is present. Presence of pericardial fat pad. Mitral Valve: The mitral valve is grossly normal. No evidence of mitral valve regurgitation. No evidence of mitral valve stenosis. Tricuspid Valve: The tricuspid valve is grossly normal. Tricuspid valve regurgitation is trivial. No evidence of tricuspid stenosis. Aortic Valve: The aortic valve is tricuspid. Aortic valve regurgitation is not visualized. No aortic stenosis is present. Pulmonic Valve: The pulmonic valve was grossly normal. Pulmonic valve regurgitation is not visualized. No evidence of pulmonic stenosis. Aorta: The aortic root is normal in size and structure. Venous: The inferior vena cava is normal in size with greater than 50% respiratory variability, suggesting right atrial pressure of 3 mmHg. IAS/Shunts: The atrial septum is grossly normal.  LEFT VENTRICLE PLAX 2D                        Biplane EF (MOD) LVIDd:         4.90 cm         LV Biplane EF:   Left LVIDs:         3.60 cm                          ventricular LV PW:         0.90 cm                           ejection LV IVS:        1.10 cm                          fraction by LVOT diam:     1.90 cm                          2D MOD LV SV:         43                               biplane is LV SV Index:   22                               63.2 %. LVOT Area:     2.84 cm  Diastology                                LV e' lateral:   8.93 cm/s LV Volumes (MOD)               LV E/e' lateral: 11.4 LV vol d, MOD    98.1 ml       LV e' medial:    6.08 cm/s A2C:                           LV E/e' medial:  16.8 LV vol d, MOD    88.1 ml A4C: LV vol s, MOD    53.3 ml A2C: LV vol s, MOD    23.4 ml A4C: LV SV MOD A2C:   44.8 ml LV SV MOD A4C:   88.1 ml LV SV MOD BP:    60.6 ml RIGHT VENTRICLE RV S prime:     11.40 cm/s TAPSE (M-mode): 1.9 cm LEFT ATRIUM             Index       RIGHT ATRIUM           Index LA diam:        3.90 cm 2.04 cm/m  RA Area:     13.40 cm LA Vol (A2C):   50.2 ml 26.24 ml/m RA Volume:   34.30 ml  17.93 ml/m LA Vol (A4C):   63.9 ml 33.40 ml/m LA Biplane Vol: 58.4 ml 30.53 ml/m  AORTIC VALVE LVOT Vmax:   88.30 cm/s LVOT Vmean:  53.100 cm/s LVOT VTI:    0.151 m  AORTA Ao Root diam: 3.30 cm MITRAL VALVE                TRICUSPID VALVE MV Area (PHT): 7.16 cm     TR Peak grad:   27.9 mmHg MV Decel Time: 106 msec     TR Vmax:        264.00 cm/s MV E velocity: 102.00 cm/s MV A velocity: 49.20 cm/s   SHUNTS MV E/A ratio:  2.07         Systemic VTI:  0.15 m                             Systemic Diam: 1.90 cm Lennie Odor MD Electronically signed by Lennie Odor MD Signature Date/Time: 02/24/2020/1:09:12 PM    Final    CT HEAD CODE STROKE WO CONTRAST  Result Date: 02/23/2020 CLINICAL DATA:  Code stroke. Neuro deficit, acute, stroke suspected. Mental status change at 11 a.m., last known well 8 a.m. this morning. EXAM: CT HEAD WITHOUT CONTRAST TECHNIQUE: Contiguous axial images were obtained from the base of the skull through the vertex without intravenous contrast. COMPARISON:  Brain MRI  09/17/2018 FINDINGS: Brain: There is a large acute demarcated ischemic infarct within the left MCA vascular territory affecting the left frontal, temporal and parietal lobes as well as left insula and subinsular region. The left caudate and lentiform nuclei are also affected. No evidence of hemorrhagic conversion. There is mild associated mass effect with 3 mm rightward midline shift. Redemonstrated superimposed small chronic infarcts within the left cerebral hemisphere. Background mild generalized parenchymal atrophy and chronic small vessel ischemic disease. No extra-axial fluid collection. No evidence of intracranial mass. Vascular: No hyperdense vessel Skull: Atherosclerotic calcifications. Sinuses/Orbits: Visualized  orbits show no acute finding. Mild ethmoid sinus mucosal thickening. No significant mastoid effusion. ASPECTS [facility] Stroke Program Early CT Score) - Ganglionic level infarction (caudate, lentiform nuclei, internal capsule, insula, M1-M3 cortex): 2 - Supraganglionic infarction (M4-M6 cortex): 2 Total score (0-10 with 10 being normal): 4 These results were called by telephone at the time of interpretation on 02/23/2020 at 12:20 pm to provider Erick Blinks, who verbally acknowledged these results. IMPRESSION: Large acute demarcated left MCA vascular territory as described. ASPECTS is 4. No evidence of hemorrhagic conversion. Mild mass effect with 3 mm rightward midline shift. Stable background mild generalized parenchymal atrophy and chronic small vessel ischemic disease. Superimposed small chronic infarcts within the left cerebral hemisphere. Electronically Signed   By: Jackey Loge DO   On: 02/23/2020 12:22   CT ANGIO HEAD CODE STROKE  Result Date: 02/23/2020 CLINICAL DATA:  Neuro deficit, acute, stroke suspected. EXAM: CT ANGIOGRAPHY HEAD AND NECK CT PERFUSION BRAIN TECHNIQUE: Multidetector CT imaging of the head and neck was performed using the standard protocol during bolus administration  of intravenous contrast. Multiplanar CT image reconstructions and MIPs were obtained to evaluate the vascular anatomy. Carotid stenosis measurements (when applicable) are obtained utilizing NASCET criteria, using the distal internal carotid diameter as the denominator. Multiphase CT imaging of the brain was performed following IV bolus contrast injection. Subsequent parametric perfusion maps were calculated using RAPID software. CONTRAST:  OMNIPAQUE IOHEXOL 350 MG/ML SOLN COMPARISON:  Noncontrast head CT performed earlier the same day. FINDINGS: CTA NECK FINDINGS Aortic arch: The left vertebral artery arises directly from the aortic arch. Atherosclerotic plaque within the visualized aortic arch and proximal major branch vessels of the neck. No hemodynamically significant innominate or proximal subclavian artery stenosis. Right carotid system: The CCA and ICA are patent within the neck. Atherosclerotic plaque within these vessels. Most notably there is at least moderate calcified plaque within the carotid bifurcation. Exact quantification of stenosis within the proximal right ICA is difficult due to motion artifact and blooming artifact of calcified plaque at this site. Additionally, there is 50-60% stenosis of the mid cervical ICA. Left carotid system: The CCA is patent to the bifurcation. 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 Vertebral arteries: The dominant right vertebral artery is patent throughout the neck. Moderate/severe atherosclerotic narrowing within the V1 right vertebral artery. The non dominant left vertebral artery is developmentally diminutive, but patent throughout the neck Skeleton: Reversal of the expected cervical lordosis. Cervical spondylosis with multilevel disc space narrowing, posterior disc osteophytes and uncovertebral hypertrophy. Other neck: No neck mass or cervical lymphadenopathy. Upper chest: No consolidation within the imaged  lung apices. 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 There is minimal reconstitution of flow within the supraclinoid left ICA. The M1 and M2 left middle cerebral arteries are patent, although with asymmetrically decreased opacification as compared to the right side. Additionally, there is a moderate to moderately severe focal stenosis within a superior division proximal M2 left MCA branch (series 8, image 17). The intracranial right internal carotid artery is patent. However, there are sites of severe stenosis within the cavernous and paraclinoid segments. The M1 right middle cerebral artery is patent without significant stenosis. No right M2 proximal branch occlusion is identified. There is a moderate to moderately severe  stenosis within an inferior division mid M2 right MCA branch vessel (series 9, image 19). The A1 left anterior cerebral artery is hypoplastic. The A1 right anterior cerebral artery is dominant. The A2 and more distal anterior cerebral arteries are patent. However, there are multifocal high-grade stenoses within the bilateral A2 anterior cerebral arteries. Posterior circulation: The non dominant intracranial left vertebral artery is developmentally diminutive, but patent and terminates as the left PICA. The dominant intracranial right vertebral artery is patent, although with sites of moderate stenosis. The basilar artery is patent. The posterior cerebral arteries are patent. However, there is a high-grade focal stenosis within the P2 left PCA and high-grade focal stenosis within the right PCA at the P2/P3 junction. Venous sinuses: Within limitations of contrast timing, no convincing thrombus. Anatomic variants: Posterior communicating arteries are hypoplastic or absent bilaterally. Review  of the MIP images confirms the above findings CT Brain Perfusion Findings: ASPECTS: 4 CBF (<30%) Volume: 89mL (left MCA vascular territory) Perfusion (Tmax>6.0s) volume: (left MCA vascular territory) Mismatch Volume: 60mL Infarction Location:Left MCA vascular territory. These results were called by telephone at the time of interpretation on 02/23/2020 at 12:40 pm to provider [facility] , who verbally acknowledged these results. IMPRESSION: CTA neck: 1. 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 2. There is at least moderate calcified plaque at the right carotid bifurcation. There is stenosis at the origin of the cervical right vertebral artery which is poorly quantified due to motion degradation and blooming artifact from calcified plaque at this site. Carotid artery duplex is recommended for further evaluation. Additionally, there is apparent 50-60% stenosis of the mid cervical right ICA. 3. Moderate/severe atherosclerotic narrowing within the Dom V1 right vertebral artery. 4. The left vertebral artery is developmentally diminutive, but patent throughout the neck. CTA head: 1. The left internal carotid artery remains occluded intracranially throughout the siphon region. There is minimal reconstitution of flow within the supraclinoid left ICA. The M1 and M2 left middle cerebral arteries are patent, although with asymmetrically decreased opacification as compared to the right side. Additionally, there is a moderate to moderately severe focal stenosis within a superior division proximal M2 left MCA branch. 2. Additional intracranial atherosclerotic disease with multifocal stenoses, most notably as follows. 3. High-grade stenoses within the cavernous/paraclinoid right ICA. 4. Moderate to moderately severe focal stenosis within an inferior division mid M2 right MCA branch vessel. 5. Moderate stenosis within the V4 right vertebral artery. 6.  High-grade stenosis within the P2 left PCA. 7. High-grade stenosis within the right PCA at the P2/P3 junction. 8. Multifocal high-grade stenoses within the A2 anterior cerebral arteries bilaterally. CT perfusion head: The perfusion software identifies an 89 mL core infarct within the left MCA vascular territory. The perfusion software identifies a 149 mL region of critically hypoperfused parenchyma within the left MCA vascular territory. Reported mismatch volume 60 mL. Electronically Signed   By: Jackey Loge DO   On: 02/23/2020 12:53   CT ANGIO NECK CODE STROKE  Result Date: 02/23/2020 CLINICAL DATA:  Neuro deficit, acute, stroke suspected. EXAM: CT ANGIOGRAPHY HEAD AND NECK CT PERFUSION BRAIN TECHNIQUE: Multidetector CT imaging of the head and neck was performed using the standard protocol during bolus administration of intravenous contrast. Multiplanar CT image reconstructions and MIPs were obtained to evaluate the vascular anatomy. Carotid stenosis measurements (when applicable) are obtained utilizing NASCET criteria, using the distal internal carotid diameter as the denominator. Multiphase CT imaging of  the brain was performed following IV bolus contrast injection. Subsequent parametric perfusion maps were calculated using RAPID software. CONTRAST:  OMNIPAQUE IOHEXOL 350 MG/ML SOLN COMPARISON:  Noncontrast head CT performed earlier the same day. FINDINGS: CTA NECK FINDINGS Aortic arch: The left vertebral artery arises directly from the aortic arch. Atherosclerotic plaque within the visualized aortic arch and proximal major branch vessels of the neck. No hemodynamically significant innominate or proximal subclavian artery stenosis. Right carotid system: The CCA and ICA are patent within the neck. Atherosclerotic plaque within these vessels. Most notably there is at least moderate calcified plaque within the carotid bifurcation. Exact quantification of stenosis within the proximal right ICA is difficult  due to motion artifact and blooming artifact of calcified plaque at this site. Additionally, there is 50-60% stenosis of the mid cervical ICA. Left carotid system: The CCA is patent to the bifurcation. 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 Vertebral arteries: The dominant right vertebral artery is patent throughout the neck. Moderate/severe atherosclerotic narrowing within the V1 right vertebral artery. The non dominant left vertebral artery is developmentally diminutive, but patent throughout the neck Skeleton: Reversal of the expected cervical lordosis. Cervical spondylosis with multilevel disc space narrowing, posterior disc osteophytes and uncovertebral hypertrophy. Other neck: No neck mass or cervical lymphadenopathy. Upper chest: No consolidation within the imaged lung apices. 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 There is minimal reconstitution of flow within the supraclinoid left ICA. The M1 and M2 left middle cerebral arteries are patent, although with asymmetrically decreased opacification as compared to the right side. Additionally, there is a moderate to moderately severe focal stenosis within a superior division proximal M2 left MCA branch (series 8, image 17). The intracranial right internal carotid artery is patent. However, there are sites of severe stenosis within the cavernous and paraclinoid segments. The M1 right middle cerebral artery is patent without significant stenosis. No right M2 proximal branch occlusion is identified. There is a moderate to moderately severe stenosis within an inferior division mid M2 right MCA branch vessel (series 9, image 19). The A1 left anterior cerebral artery is hypoplastic. The A1 right anterior  cerebral artery is dominant. The A2 and more distal anterior cerebral arteries are patent. However, there are multifocal high-grade stenoses within the bilateral A2 anterior cerebral arteries. Posterior circulation: The non dominant intracranial left vertebral artery is developmentally diminutive, but patent and terminates as the left PICA. The dominant intracranial right vertebral artery is patent, although with sites of moderate stenosis. The basilar artery is patent. The posterior cerebral arteries are patent. However, there is a high-grade focal stenosis within the P2 left PCA and high-grade focal stenosis within the right PCA at the P2/P3 junction. Venous sinuses: Within limitations of contrast timing, no convincing thrombus. Anatomic variants: Posterior communicating arteries are hypoplastic or absent bilaterally. Review of the MIP images confirms the above findings CT Brain Perfusion Findings: ASPECTS: 4 CBF (<30%) Volume: 89mL (left MCA vascular territory) Perfusion (Tmax>6.0s) volume: (left MCA vascular territory) Mismatch Volume: 60mL Infarction Location:Left MCA vascular territory. These results were called by telephone at the time of interpretation on 02/23/2020 at 12:40 pm to provider [facility] , who verbally acknowledged these results. IMPRESSION: CTA neck: 1. The cervical left ICA is occluded from its  origin and remains occluded throughout the remainder of the neck. 2. There is at least moderate calcified plaque at the right carotid bifurcation. There is stenosis at the origin of the cervical right vertebral artery which is poorly quantified due to motion degradation and blooming artifact from calcified plaque at this site. Carotid artery duplex is recommended for further evaluation. Additionally, there is apparent 50-60% stenosis of the mid cervical right ICA. 3. Moderate/severe atherosclerotic narrowing within the Dom V1 right vertebral artery. 4. The left vertebral artery is  developmentally diminutive, but patent throughout the neck. CTA head: 1. The left internal carotid artery remains occluded intracranially throughout the siphon region. There is minimal reconstitution of flow within the supraclinoid left ICA. The M1 and M2 left middle cerebral arteries are patent, although with asymmetrically decreased opacification as compared to the right side. Additionally, there is a moderate to moderately severe focal stenosis within a superior division proximal M2 left MCA branch. 2. Additional intracranial atherosclerotic disease with multifocal stenoses, most notably as follows. 3. High-grade stenoses within the cavernous/paraclinoid right ICA. 4. Moderate to moderately severe focal stenosis within an inferior division mid M2 right MCA branch vessel. 5. Moderate stenosis within the V4 right vertebral artery. 6. High-grade stenosis within the P2 left PCA. 7. High-grade stenosis within the right PCA at the P2/P3 junction. 8. Multifocal high-grade stenoses within the A2 anterior cerebral arteries bilaterally. CT perfusion head: The perfusion software identifies an 89 mL core infarct within the left MCA vascular territory. The perfusion software identifies a 149 mL region of critically hypoperfused parenchyma within the left MCA vascular territory. Reported mismatch volume 60 mL. Electronically Signed   By: Jackey Loge DO   On: 02/23/2020 12:53     Time Spent in minutes  30     Laverna Peace M.D on 03/05/2020 at 2:09 PM  To page go to [email or web address] - password [email or web address]

## 2020-03-06 NOTE — Progress Notes (Signed)
[facility]  PROGRESS NOTE  DELONTAE LAMM KKX:[id number] DOB: [birth year] DOA: 02/23/2020 PCP: Dione Housekeeper, MD Admit date - 02/23/2020   Admitting Physician Odette Fraction, MD  Outpatient Primary MD for the patient is Zada Finders Joycie Peek, MD  LOS - 12 Brief Narrative   MITHRAN STRIKE is a 59 y.o. year old male with medical history significant for HTN, HLD, chronic smoking, significant peripheral artery disease who presented on 02/23/2020 worsening left leg pain for the past month and was admitted for high concern for acute limb ischemia, patient underwent emergent angiogram and stent placement and bilateral iliac arteries and external iliac arteries, course complicated by acute thrombosis of iliac stents requiring thrombectomy on 8/7 argatroban drip was also started due to concern for heparin-induced thrombocytopenia.  On 8/8 in setting of new slurred speech and right-sided weakness CT head was obtained which showed left-sided MCA he was transferred to [facility] for neuro ICU monitoring due to concern for malignant cerebral edema   Subjective  Today he has no complaints.  He wants to lay his bed down flat advise keeping it in a angle due to rhonchorous lung sounds and slightly productive cough today  A & P   Acute left MCA stroke, left ICA occlusion.  Large left MCA stroke with CT head, repeat CT head on 8/13 showed stable MCA and improved midline shift .  Continues to have right-sided hemiplegia and receptive/expressive aphasia -Continue Eliquis, Plavix and Lipitor  Cerebral edema in setting of above.  Briefly required hypertonic normal saline during hospital course that led to some hypernatremia.  No longer on hypertonic saline, sodium levels within normal limits, repeat CT scan shows stable cerebral edema without worsening midline shift   Acute limb ischemia, status post angioplasty and stent placement.  Complicated by thrombectomy/endarterectomy with  fasciotomy -Continue Eliquis and Plavix  Concern for HIT, ruled out.  HIT panel negative. -Continue to monitor on Eliquis  Atrial fibrillation with RVR, new onset.  Currently rate controlled.  Briefly required Cardizem drip. -Normal rate on metoprolol 75 mg twice daily -TTE with preserved EF -Currently on Eliquis  Scrotal edema, stable -Continue to monitor -Scrotal sling  Elevated troponin, suspect demand ischemia troponin elevated 2000 2059 down to 2227, EF remains preserved with no wall motion abnormalities, and no ischemic changes on EKG.  Likely demand ischemia in the setting of A. fib and CVA  Dysphagia secondary to stroke.  No aspiration events in the hospital Has rhonchorous breath sounds -Speech recommends dysphagia 1 diet with thin liquids  -Aspiration precautions-encourage incentive spirometer, flutter valve, continue scheduled Mucinex  HTN, stable -Continue metoprolol, lisinopril, amlodipine  HLD, stable -Continue Lipitor  Transaminitis, continue trending -Okay to continue Lipitor  Acute blood loss anemia in setting of operative intervention as mentioned above -Hemoglobin stable-transfuse if hemoglobin less than 7  Hypokalemia -Improved with oral replacement      Family Communication  : None  Code Status : Full  Disposition Plan  :  Patient is from home. Anticipated d/c date:  1-2 days. Barriers to d/c or necessity for inpatient status:  Medically stable for discharge, currently awaiting SNF placement Consults  : Vascular surgery, neurology  Procedures  :    DVT Prophylaxis  : Eliquis, Plavix  Lab Results  Component Value Date   PLT 361 03/04/2020    Diet :  Diet Order            DIET - DYS 1 Room service appropriate? No; Fluid  consistency: Thin  Diet effective now                  Inpatient Medications Scheduled Meds: . amLODipine  10 mg Oral Daily  . apixaban  5 mg Oral BID  . atorvastatin  80 mg Oral Daily  . chlorhexidine  15 mL  Mouth Rinse BID  . Chlorhexidine Gluconate Cloth  6 each Topical Daily  . clopidogrel  75 mg Oral Daily  . feeding supplement (ENSURE ENLIVE)  237 mL Oral BID BM  . folic acid  1 mg Oral Daily  . guaiFENesin  600 mg Oral BID  . lisinopril  40 mg Oral Daily  . mouth rinse  15 mL Mouth Rinse q12n4p  . metoprolol tartrate  75 mg Oral BID  . multivitamin with minerals  1 tablet Oral Daily  . nicotine  21 mg Transdermal Q2000  . pantoprazole  40 mg Oral Q2000  . potassium chloride  40 mEq Oral Daily   Continuous Infusions: . sodium chloride Stopped (02/25/20 1233)   PRN Meds:.sodium chloride, acetaminophen, albuterol, docusate sodium, ipratropium-albuterol, labetalol, ondansetron (ZOFRAN) IV, polyethylene glycol  Antibiotics  :   Anti-infectives (From admission, onward)   None       Objective   Vitals:   03/06/20 0311 03/06/20 0500 03/06/20 0757 03/06/20 1133  BP: 122/70  132/67 131/61  Pulse: 61  63 66  Resp: 18  18 18   Temp: 98.3 F (36.8 C)  98.3 F (36.8 C) 98 F (36.7 C)  TempSrc: Oral  Oral Oral  SpO2: 99%  97% 98%  Weight:  84.4 kg      SpO2: 98 % O2 Flow Rate (L/min): 2 L/min FiO2 (%): 21 %  Wt Readings from Last 3 Encounters:  03/06/20 84.4 kg  02/21/20 79.6 kg  02/21/20 79.4 kg     Intake/Output Summary (Last 24 hours) at 03/06/2020 1621 Last data filed at 03/06/2020 1000 Gross per 24 hour  Intake 340 ml  Output 1200 ml  Net -860 ml    Physical Exam:     Awake Alert, Oriented X 3, Normal affect Right sided hemiplegia, expressive/receptive aphasia present, follows commands with the left side Scrotal swelling present without tenderness .AT, Normal respiratory effort on room air, occasional wheezing heard RRR,No Gallops,Rubs or new Murmurs,  +ve B.Sounds, Abd Soft, No tenderness, No rebound, guarding or rigidity. Staples in place on the left lower anterior leg    I have personally reviewed the following:   Data Reviewed:  CBC Recent  Labs  Lab 02/29/20 0305 03/01/20 0803 03/03/20 0153 03/04/20 0137  WBC 11.3* 11.3* 11.7* 12.2*  HGB 10.5* 10.9* 10.5* 10.4*  HCT 32.7* 33.0* 32.4* 32.5*  PLT 274 317 330 361  MCV 96.7 94.6 94.2 94.2  MCH 31.1 31.2 30.5 30.1  MCHC 32.1 33.0 32.4 32.0  RDW 13.3 12.9 12.5 12.7  LYMPHSABS  --  1.5 1.2 1.4  MONOABS  --  1.0 1.0 1.1*  EOSABS  --  0.1 0.1 0.1  BASOSABS  --  0.0 0.0 0.0    Chemistries  Recent Labs  Lab 02/29/20 0305 03/01/20 0803 03/03/20 0153 03/04/20 0137  NA 143 141 136 137  K 3.0* 3.1* 3.2* 3.5  CL 107 106 103 104  CO2 27 26 25 25   GLUCOSE 135* 131* 135* 138*  BUN 18 13 10 17   CREATININE 0.68 0.68 0.64 0.65  CALCIUM 8.6* 8.5* 8.4* 8.5*  MG  --  2.1  --   --  AST  --  35  --   --   ALT  --  62*  --   --   ALKPHOS  --  43  --   --   BILITOT  --  0.9  --   --    ------------------------------------------------------------------------------------------------------------------ No results for input(s): CHOL, HDL, LDLCALC, TRIG, CHOLHDL, LDLDIRECT in the last 72 hours.  Lab Results  Component Value Date   HGBA1C 6.2 (H) 02/23/2020   ------------------------------------------------------------------------------------------------------------------ No results for input(s): TSH, T4TOTAL, T3FREE, THYROIDAB in the last 72 hours.  Invalid input(s): FREET3 ------------------------------------------------------------------------------------------------------------------ No results for input(s): VITAMINB12, FOLATE, FERRITIN, TIBC, IRON, RETICCTPCT in the last 72 hours.  Coagulation profile No results for input(s): INR, PROTIME in the last 168 hours.  No results for input(s): DDIMER in the last 72 hours.  Cardiac Enzymes No results for input(s): CKMB, TROPONINI, MYOGLOBIN in the last 168 hours.  Invalid input(s): CK ------------------------------------------------------------------------------------------------------------------ No results found for:  BNP  Micro Results No results found for this or any previous visit (from the past 240 hour(s)).  Radiology Reports CT HEAD WO CONTRAST  Result Date: 02/28/2020 CLINICAL DATA:  Stroke, follow-up. EXAM: CT HEAD WITHOUT CONTRAST TECHNIQUE: Contiguous axial images were obtained from the base of the skull through the vertex without intravenous contrast. COMPARISON:  CT head without contrast 02/26/2020, 02/24/2020, and 02/23/2019. FINDINGS: Brain: Large left MCA territory nonhemorrhagic infarct is again noted. Infarct involves the left lentiform nucleus and caudate head. Mass effect is again noted. Midline shift is stable to slightly improved at 4.5 mm. Partial effacement of left lateral ventricle is again noted. Sulcal effacement is stable. No significant extra-axial hemorrhage is present. No significant right-sided infarcts are present. Cerebellar atrophy is stable. No acute infarcts are present in the posterior fossa. Vascular: Atherosclerotic changes are present within the cavernous internal carotid arteries bilaterally. No hyperdense vessel is present. Skull: Calvarium is intact. No focal lytic or blastic lesions are present. No significant extracranial soft tissue lesion is present. Sinuses/Orbits: The paranasal sinuses and mastoid air cells are clear. Remote right orbital blowout fracture is stable. Globes and orbits are otherwise within normal limits. IMPRESSION: 1. Stable appearance of large left MCA territory nonhemorrhagic infarct. 2. Stable to slightly improved midline shift. 3. No significant extra-axial hemorrhage. 4. Stable atrophy and white matter disease. Electronically Signed   By: Marin Roberts M.D.   On: 02/28/2020 06:33   CT HEAD WO CONTRAST  Result Date: 02/26/2020 CLINICAL DATA:  Follow-up examination for acute stroke. EXAM: CT HEAD WITHOUT CONTRAST TECHNIQUE: Contiguous axial images were obtained from the base of the skull through the vertex without intravenous contrast.  COMPARISON:  Prior CT from 02/24/2020. FINDINGS: Brain: Continued interval evolution of large left MCA territory infarct is seen, overall stable in size and distribution from previous. Associated regional mass effect with partial effacement of the left lateral ventricle and up to 5 mm of left-to-right shift, slightly worsened from previous. No hydrocephalus or ventricular trapping. Basilar cisterns remain patent. No evidence for hemorrhagic transformation. No other new acute large vessel territory infarct. No intracranial hemorrhage. No extra-axial fluid collection. Vascular: No hyperdense vessel. Scattered vascular calcifications noted within the carotid siphons. Skull: Scalp soft tissues within normal limits. Calvarium unchanged. Sinuses/Orbits: Globes and orbital soft tissues demonstrate no acute finding. Paranasal sinuses and mastoid air cells remain largely clear. Other: None. IMPRESSION: 1. Continued interval evolution of large left MCA territory infarct, overall stable in size and distribution from previous. Associated regional mass effect with up to  5 mm of left-to-right shift, slightly worsened from previous. No hydrocephalus or ventricular trapping. No evidence for hemorrhagic transformation. 2. No other new acute intracranial abnormality. Electronically Signed   By: Rise Mu M.D.   On: 02/26/2020 02:07   CT HEAD WO CONTRAST  Result Date: 02/24/2020 CLINICAL DATA:  Stroke follow-up EXAM: CT HEAD WITHOUT CONTRAST TECHNIQUE: Contiguous axial images were obtained from the base of the skull through the vertex without intravenous contrast. COMPARISON:  02/23/2020 head CT. FINDINGS: Brain: Sequela of evolving left MCA territory infarct. 3-4 mm rightward midline shift is unchanged. No intracranial hemorrhage. No mass lesion. No ventriculomegaly or extra-axial fluid collection. Vascular: No hyperdense vessel. Bilateral skull base atherosclerotic calcifications. Skull: Negative for fracture or focal  lesion. Sinuses/Orbits: Normal orbits. Soft tissue impacted left maxillary molar, unchanged. Mild right sphenoid sinus mucosal thickening. No mastoid effusion. Other: None. IMPRESSION: Evolving left MCA territory infarct with unchanged 3-4 mm rightward midline shift. No intracranial hemorrhage. Electronically Signed   By: Stana Bunting M.D.   On: 02/24/2020 09:29   CT HEAD WO CONTRAST  Result Date: 02/23/2020 CLINICAL DATA:  Stroke follow-up EXAM: CT HEAD WITHOUT CONTRAST TECHNIQUE: Contiguous axial images were obtained from the base of the skull through the vertex without intravenous contrast. COMPARISON:  Head CT 02/21/2020 FINDINGS: Brain: Large area of cytotoxic edema throughout the left MCA territory. No acute hemorrhage. 3 mm rightward midline shift. Vascular: Atherosclerotic calcification of the internal carotid arteries at the skull base. Skull: Normal. Negative for fracture or focal lesion. Sinuses/Orbits: No acute finding. Other: None. IMPRESSION: 1. Slightly increased cytotoxic edema within the left MCA territory. 2. No acute hemorrhage. 3. 3 mm rightward midline shift. Electronically Signed   By: Deatra Robinson M.D.   On: 02/23/2020 22:00   US Carotid Bilateral  Result Date: 02/23/2020 CLINICAL DATA:  Slurred speech.  Carotid stenosis. EXAM: BILATERAL CAROTID DUPLEX ULTRASOUND TECHNIQUE: Wallace Cullens scale imaging, color Doppler and duplex ultrasound were performed of bilateral carotid and vertebral arteries in the neck. COMPARISON:  CTA neck 02/23/2020 FINDINGS: Criteria: Quantification of carotid stenosis is based on velocity parameters that correlate the residual internal carotid diameter with NASCET-based stenosis levels, using the diameter of the distal internal carotid lumen as the denominator for stenosis measurement. The following velocity measurements were obtained: RIGHT ICA: 117/63 cm/sec CCA: 130/38 cm/sec SYSTOLIC ICA/CCA RATIO:  0.9 ECA: 220 cm/sec LEFT ICA: Occluded CCA: 75/10 cm/sec  ECA: 195 cm/sec RIGHT CAROTID ARTERY: Mild atherosclerotic disease in the right common carotid artery. Heterogeneous plaque at the right carotid bulb. External carotid artery is patent with normal waveform. Atherosclerotic plaque in the proximal internal carotid artery. Normal waveforms and velocities in the internal carotid artery. RIGHT VERTEBRAL ARTERY: Antegrade flow and normal waveform in the right vertebral artery. LEFT CAROTID ARTERY: Left common carotid artery is patent with intimal thickening. Left carotid artery is patent. External carotid artery is patent with normal waveform. 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 No flow in the mid or distal left internal carotid artery. LEFT VERTEBRAL ARTERY: Antegrade flow and normal waveform in the left vertebral artery. IMPRESSION: 1. 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 Findings correspond with recent CTA findings. 2. Atherosclerotic plaque involving the carotid arteries. Estimated degree of stenosis in the right internal carotid artery is less than 50%. 3. Patent vertebral arteries with antegrade flow. Electronically Signed   By: Richarda Overlie M.D.   On: 02/23/2020 14:42   PERIPHERAL VASCULAR CATHETERIZATION  Result Date:  02/21/2020 See op note  CT CEREBRAL PERFUSION W CONTRAST  Result Date: 02/23/2020 CLINICAL DATA:  Neuro deficit, acute, stroke suspected. EXAM: CT ANGIOGRAPHY HEAD AND NECK CT PERFUSION BRAIN TECHNIQUE: Multidetector CT imaging of the head and neck was performed using the standard protocol during bolus administration of intravenous contrast. Multiplanar CT image reconstructions and MIPs were obtained to evaluate the vascular anatomy. Carotid stenosis measurements (when applicable) are obtained utilizing NASCET criteria, using the distal internal carotid diameter as the denominator. Multiphase CT imaging of the brain was performed following IV bolus contrast injection. Subsequent parametric perfusion maps  were calculated using RAPID software. CONTRAST:  OMNIPAQUE IOHEXOL 350 MG/ML SOLN COMPARISON:  Noncontrast head CT performed earlier the same day. FINDINGS: CTA NECK FINDINGS Aortic arch: The left vertebral artery arises directly from the aortic arch. Atherosclerotic plaque within the visualized aortic arch and proximal major branch vessels of the neck. No hemodynamically significant innominate or proximal subclavian artery stenosis. Right carotid system: The CCA and ICA are patent within the neck. Atherosclerotic plaque within these vessels. Most notably there is at least moderate calcified plaque within the carotid bifurcation. Exact quantification of stenosis within the proximal right ICA is difficult due to motion artifact and blooming artifact of calcified plaque at this site. Additionally, there is 50-60% stenosis of the mid cervical ICA. Left carotid system: The CCA is patent to the bifurcation. 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 Vertebral arteries: The dominant right vertebral artery is patent throughout the neck. Moderate/severe atherosclerotic narrowing within the V1 right vertebral artery. The non dominant left vertebral artery is developmentally diminutive, but patent throughout the neck Skeleton: Reversal of the expected cervical lordosis. Cervical spondylosis with multilevel disc space narrowing, posterior disc osteophytes and uncovertebral hypertrophy. Other neck: No neck mass or cervical lymphadenopathy. Upper chest: No consolidation within the imaged lung apices. 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 There is minimal reconstitution of flow within the supraclinoid left ICA. The M1 and M2 left middle cerebral arteries are patent,  although with asymmetrically decreased opacification as compared to the right side. Additionally, there is a moderate to moderately severe focal stenosis within a superior division proximal M2 left MCA branch (series 8, image 17). The intracranial right internal carotid artery is patent. However, there are sites of severe stenosis within the cavernous and paraclinoid segments. The M1 right middle cerebral artery is patent without significant stenosis. No right M2 proximal branch occlusion is identified. There is a moderate to moderately severe stenosis within an inferior division mid M2 right MCA branch vessel (series 9, image 19). The A1 left anterior cerebral artery is hypoplastic. The A1 right anterior cerebral artery is dominant. The A2 and more distal anterior cerebral arteries are patent. However, there are multifocal high-grade stenoses within the bilateral A2 anterior cerebral arteries. Posterior circulation: The non dominant intracranial left vertebral artery is developmentally diminutive, but patent and terminates as the left PICA. The dominant intracranial right vertebral artery is patent, although with sites of moderate stenosis. The basilar artery is patent. The posterior cerebral arteries are patent. However, there is a high-grade focal stenosis within the P2 left PCA and high-grade focal stenosis within the right PCA at the P2/P3 junction. Venous sinuses: Within limitations of contrast timing, no convincing  thrombus. Anatomic variants: Posterior communicating arteries are hypoplastic or absent bilaterally. Review of the MIP images confirms the above findings CT Brain Perfusion Findings: ASPECTS: 4 CBF (<30%) Volume: 89mL (left MCA vascular territory) Perfusion (Tmax>6.0s) volume: (left MCA vascular territory) Mismatch Volume: 60mL Infarction Location:Left MCA vascular territory. These results were called by telephone at the time of interpretation on 02/23/2020 at 12:40 pm to provider [facility] , who verbally acknowledged these results. IMPRESSION: CTA neck: 1. 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 2. There is at least moderate calcified plaque at the right carotid bifurcation. There is stenosis at the origin of the cervical right vertebral artery which is poorly quantified due to motion degradation and blooming artifact from calcified plaque at this site. Carotid artery duplex is recommended for further evaluation. Additionally, there is apparent 50-60% stenosis of the mid cervical right ICA. 3. Moderate/severe atherosclerotic narrowing within the Dom V1 right vertebral artery. 4. The left vertebral artery is developmentally diminutive, but patent throughout the neck. CTA head: 1. The left internal carotid artery remains occluded intracranially throughout the siphon region. There is minimal reconstitution of flow within the supraclinoid left ICA. The M1 and M2 left middle cerebral arteries are patent, although with asymmetrically decreased opacification as compared to the right side. Additionally, there is a moderate to moderately severe focal stenosis within a superior division proximal M2 left MCA branch. 2. Additional intracranial atherosclerotic disease with multifocal stenoses, most notably as follows. 3. High-grade stenoses within the cavernous/paraclinoid right ICA. 4. Moderate to moderately severe focal stenosis within an inferior division mid M2 right MCA branch vessel. 5. Moderate stenosis within the V4 right vertebral artery. 6. High-grade stenosis within the P2 left PCA. 7. High-grade stenosis within the right PCA at the P2/P3 junction. 8. Multifocal high-grade stenoses within the A2 anterior cerebral arteries bilaterally. CT perfusion head: The perfusion software identifies an 89 mL core infarct within the left MCA vascular territory. The perfusion software identifies a 149 mL region of critically hypoperfused  parenchyma within the left MCA vascular territory. Reported mismatch volume 60 mL. Electronically Signed   By: Jackey Loge DO   On: 02/23/2020 12:53   DG CHEST PORT 1 VIEW  Result Date: 02/24/2020 CLINICAL DATA:  59 year old male with shortness of breath. EXAM: PORTABLE CHEST 1 VIEW COMPARISON:  Chest radiograph dated 02/22/2020. FINDINGS: Status post removal of the endotracheal and enteric tube. There is diffuse interstitial densities which may be chronic or represent edema or atypical pneumonia. No large pleural effusion. No pneumothorax. Stable cardiac silhouette. No acute osseous pathology. IMPRESSION: Interval removal of the endotracheal and enteric tubes. Electronically Signed   By: Elgie Collard M.D.   On: 02/24/2020 19:11   Portable Chest x-ray  Result Date: 02/22/2020 CLINICAL DATA:  Left cerebral artery and polyp stroke, postop bilateral iliofemoral thrombectomy, ETT EXAM: PORTABLE CHEST 1 VIEW COMPARISON:  01/12/2013 FINDINGS: Lungs are essentially clear.  No pleural effusion or pneumothorax. The heart is normal in size. Endotracheal tube terminates 5 cm above the carina. Enteric tube terminates in the proximal gastric body. IMPRESSION: Endotracheal tube terminates 5 cm above the carina. Enteric tube terminates in the proximal gastric body. Electronically Signed   By: Charline Bills M.D.   On: 02/22/2020 08:54   DG Swallowing Func-Speech Pathology  Result Date: 02/28/2020 Objective Swallowing Evaluation: Type of Study: MBS-Modified Barium Swallow Study  Patient Details Name: HEATHER STREEPER MRN: [id number] Date  of Birth: 1961/05/15 Today's Date: 02/28/2020 Time: SLP Start Time (ACUTE ONLY): 1457 -SLP Stop Time (ACUTE ONLY): 1513 SLP Time Calculation (min) (ACUTE ONLY): 16 min Past Medical History: Past Medical History: Diagnosis Date . Aphasia S/P CVA  . DDD (degenerative disc disease), cervical  . ED (erectile dysfunction)  . Hyperlipidemia  . Hypertension  . Peripheral arterial disease  (HCC)  . Stroke [facility])  Past Surgical History: Past Surgical History: Procedure Laterality Date . FEMORAL-FEMORAL BYPASS GRAFT Bilateral 02/22/2020  Procedure: bilateral ileofemoral thrombectomy, left popliteal exposure and thrombectomy, four compartment fasciotomy, left femoral endarterectomy with patch angioplasty;  Surgeon: Nada Libman, MD;  Location: [facility] ORS;  Service: Vascular;  Laterality: Bilateral; . LOWER EXTREMITY ANGIOGRAPHY Left 02/21/2020  Procedure: LOWER EXTREMITY ANGIOGRAPHY;  Surgeon: Renford Dills, MD;  Location: [facility] INVASIVE CV LAB;  Service: Cardiovascular;  Laterality: Left; . PERIPHERAL VASCULAR CATHETERIZATION N/A 07/21/2015  Procedure: Abdominal Aortogram w/Lower Extremity;  Surgeon: Renford Dills, MD;  Location: [facility] INVASIVE CV LAB;  Service: Cardiovascular;  Laterality: N/A; . PERIPHERAL VASCULAR CATHETERIZATION  07/21/2015  Procedure: Lower Extremity Intervention;  Surgeon: Renford Dills, MD;  Location: [facility] INVASIVE CV LAB;  Service: Cardiovascular;; HPI: 59 y.o. male with a history of hypertension, hyperlipidemia, CVA, severe PAD  who presented with acute critical limb ischemia secondary to occlusion of the iliac artery who underwent stenting on 8/6 at [facility].  He was taken back to the OR on 8/7 found to have acute thrombosis of bilateral iliac stents underwent thrombectomy and endarterectomy;  fasciotomies of the left leg. Extubated 8/8. Developed right hemiplegia and aphasia morning of 8/8. CTH: large territory infarct left MCA. Pt transferred to [facility] same day.   Subjective: alert, aphasic Assessment / Plan / Recommendation CHL IP CLINICAL IMPRESSIONS 02/28/2020 Clinical Impression Pt's oropharyngeal swallow is stable compared to MBS completed on 8/9 even with pt self-feeding throughout this study (see that note for more specifics regarding physiology). Pt was allowed to self-feed at his own rate, drinking cups of thin barium without stopping. Across multiple small cupfuls,  there was no aspiration. Trace penetration occurred x2 with thin liquids but cleared spontaneously as he continued to swallow additional boluses. Would continue Dys 1 (puree) diet and thin liquids with full supervision for safety.  SLP Visit Diagnosis Dysphagia, oropharyngeal phase (R13.12) Attention and concentration deficit following -- Frontal lobe and executive function deficit following -- Impact on safety and function Mild aspiration risk   CHL IP TREATMENT RECOMMENDATION 02/28/2020 Treatment Recommendations Therapy as outlined in treatment plan below   Prognosis 02/28/2020 Prognosis for Safe Diet Advancement Good Barriers to Reach Goals Language deficits Barriers/Prognosis Comment -- CHL IP DIET RECOMMENDATION 02/28/2020 SLP Diet Recommendations Dysphagia 1 (Puree) solids;Thin liquid Liquid Administration via Cup;Straw Medication Administration Whole meds with puree Compensations Minimize environmental distractions;Slow rate;Small sips/bites Postural Changes Seated upright at 90 degrees;Remain semi-upright after after feeds/meals (Comment)   CHL IP OTHER RECOMMENDATIONS 02/28/2020 Recommended Consults -- Oral Care Recommendations Oral care BID Other Recommendations Have oral suction available   CHL IP FOLLOW UP RECOMMENDATIONS 02/28/2020 Follow up Recommendations Inpatient Rehab   CHL IP FREQUENCY AND DURATION 02/28/2020 Speech Therapy Frequency (ACUTE ONLY) min 2x/week Treatment Duration 2 weeks      CHL IP ORAL PHASE 02/28/2020 Oral Phase Impaired Oral - Pudding Teaspoon -- Oral - Pudding Cup -- Oral - Honey Teaspoon -- Oral - Honey Cup -- Oral - Nectar Teaspoon -- Oral - Nectar Cup -- Oral - Nectar Straw NT Oral - Thin  Teaspoon -- Oral - Thin Cup Right anterior bolus loss;Weak lingual manipulation;Reduced posterior propulsion;Holding of bolus;Right pocketing in lateral sulci;Pocketing in anterior sulcus;Lingual/palatal residue;Piecemeal swallowing;Decreased bolus cohesion Oral - Thin Straw Right anterior bolus  loss;Weak lingual manipulation;Reduced posterior propulsion;Holding of bolus;Right pocketing in lateral sulci;Pocketing in anterior sulcus;Lingual/palatal residue;Piecemeal swallowing;Decreased bolus cohesion Oral - Puree Right anterior bolus loss;Weak lingual manipulation;Reduced posterior propulsion;Holding of bolus;Right pocketing in lateral sulci;Pocketing in anterior sulcus;Lingual/palatal residue;Piecemeal swallowing;Decreased bolus cohesion Oral - Mech Soft -- Oral - Regular NT Oral - Multi-Consistency -- Oral - Pill -- Oral Phase - Comment --  CHL IP PHARYNGEAL PHASE 02/28/2020 Pharyngeal Phase Impaired Pharyngeal- Pudding Teaspoon -- Pharyngeal -- Pharyngeal- Pudding Cup -- Pharyngeal -- Pharyngeal- Honey Teaspoon -- Pharyngeal -- Pharyngeal- Honey Cup -- Pharyngeal -- Pharyngeal- Nectar Teaspoon -- Pharyngeal -- Pharyngeal- Nectar Cup -- Pharyngeal -- Pharyngeal- Nectar Straw NT Pharyngeal -- Pharyngeal- Thin Teaspoon -- Pharyngeal -- Pharyngeal- Thin Cup Delayed swallow initiation-pyriform sinuses;Delayed swallow initiation-vallecula Pharyngeal -- Pharyngeal- Thin Straw Delayed swallow initiation-pyriform sinuses;Delayed swallow initiation-vallecula Pharyngeal -- Pharyngeal- Puree Delayed swallow initiation-pyriform sinuses;Delayed swallow initiation-vallecula Pharyngeal -- Pharyngeal- Mechanical Soft -- Pharyngeal -- Pharyngeal- Regular NT Pharyngeal -- Pharyngeal- Multi-consistency -- Pharyngeal -- Pharyngeal- Pill -- Pharyngeal -- Pharyngeal Comment --  CHL IP CERVICAL ESOPHAGEAL PHASE 02/28/2020 Cervical Esophageal Phase WFL Pudding Teaspoon -- Pudding Cup -- Honey Teaspoon -- Honey Cup -- Nectar Teaspoon -- Nectar Cup -- Nectar Straw -- Thin Teaspoon -- Thin Cup -- Thin Straw -- Puree -- Mechanical Soft -- Regular -- Multi-consistency -- Pill -- Cervical Esophageal Comment -- Mahala Menghini., M.A. CCC-SLP Acute Rehabilitation Services Pager [phone] Office [phone] 02/28/2020, 4:22 PM               DG Swallowing Func-Speech Pathology  Result Date: 02/24/2020 Objective Swallowing Evaluation: Type of Study: MBS-Modified Barium Swallow Study  Patient Details Name: JERMARI TAMARGO MRN: [id number] Date of Birth: July 21, 1960 Today's Date: 02/24/2020 Time: SLP Start Time (ACUTE ONLY): 1310 -SLP Stop Time (ACUTE ONLY): 1340 SLP Time Calculation (min) (ACUTE ONLY): 30 min Past Medical History: Past Medical History: Diagnosis Date . Aphasia S/P CVA  . DDD (degenerative disc disease), cervical  . ED (erectile dysfunction)  . Hyperlipidemia  . Hypertension  . Peripheral arterial disease (HCC)  . Stroke [facility])  Past Surgical History: Past Surgical History: Procedure Laterality Date . FEMORAL-FEMORAL BYPASS GRAFT Bilateral 02/22/2020  Procedure: bilateral ileofemoral thrombectomy, left popliteal exposure and thrombectomy, four compartment fasciotomy, left femoral endarterectomy with patch angioplasty;  Surgeon: Nada Libman, MD;  Location: [facility] ORS;  Service: Vascular;  Laterality: Bilateral; . LOWER EXTREMITY ANGIOGRAPHY Left 02/21/2020  Procedure: LOWER EXTREMITY ANGIOGRAPHY;  Surgeon: Renford Dills, MD;  Location: [facility] INVASIVE CV LAB;  Service: Cardiovascular;  Laterality: Left; . PERIPHERAL VASCULAR CATHETERIZATION N/A 07/21/2015  Procedure: Abdominal Aortogram w/Lower Extremity;  Surgeon: Renford Dills, MD;  Location: [facility] INVASIVE CV LAB;  Service: Cardiovascular;  Laterality: N/A; . PERIPHERAL VASCULAR CATHETERIZATION  07/21/2015  Procedure: Lower Extremity Intervention;  Surgeon: Renford Dills, MD;  Location: [facility] INVASIVE CV LAB;  Service: Cardiovascular;; HPI: 59 y.o. male with a history of hypertension, hyperlipidemia, CVA, severe PAD  who presented with acute critical limb ischemia secondary to occlusion of the iliac artery who underwent stenting on 8/6 at [facility].  He was taken back to the OR on 8/7 found to have acute thrombosis of bilateral iliac stents underwent thrombectomy and endarterectomy;   fasciotomies of the left leg. Extubated 8/8. Developed right hemiplegia and aphasia morning  of 8/8. CTH: large territory infarct left MCA. Pt transferred to [facility] same day.   Subjective: alert, aphasic Assessment / Plan / Recommendation CHL IP CLINICAL IMPRESSIONS 02/24/2020 Clinical Impression Pt presents with a primary oral dysphagia c/b impaired bolus cohesion and control over release into the pharynx.  There was decreased sensation on the right, with barium residue in right lateral and anterior sulci and limited awareness of its presence.  Pharyngeal function was largely intact, with adequate pharyngeal squeeze, no residue post-swallow, and effective laryngeal vestibule closure.  Occasionally, thin and nectar thick barium reached the pyriforms for a delayed period of time before the swallow was triggered.  On other occasions, the swallow trigger was timely.  There was one incident of penetration to the vocal folds when a bolus of nectar thick liquid prematurely escaped the oral cavity and entered the laryngeal vestibule without eliciting a response.  Pt's impaired sensation appears to be the primary concern and puts him at greater risk of aspiration.  For now, recommend starting a dysphagia 1 diet, thin liquids from cup only - NO STRAWS; give meds whole in applesauce/pudding.  Due to pt's impulsivity, difficulty following commands, and poor sensation/awareness on right side of mouth/throat, he will require careful supervision during meals.  SLP will follow for safety/diet progression and therapeutic training.   SLP Visit Diagnosis Dysphagia, oropharyngeal phase (R13.12) Attention and concentration deficit following -- Frontal lobe and executive function deficit following -- Impact on safety and function Mild aspiration risk   CHL IP TREATMENT RECOMMENDATION 02/24/2020 Treatment Recommendations Therapy as outlined in treatment plan below   Prognosis 02/24/2020 Prognosis for Safe Diet Advancement Good Barriers to Reach  Goals -- Barriers/Prognosis Comment -- CHL IP DIET RECOMMENDATION 02/24/2020 SLP Diet Recommendations Dysphagia 1 (Puree) solids;Thin liquid Liquid Administration via Cup;No straw Medication Administration Whole meds with puree Compensations Minimize environmental distractions;Slow rate;Small sips/bites Postural Changes --   CHL IP OTHER RECOMMENDATIONS 02/24/2020 Recommended Consults -- Oral Care Recommendations Oral care BID Other Recommendations --   CHL IP FOLLOW UP RECOMMENDATIONS 02/24/2020 Follow up Recommendations Inpatient Rehab   CHL IP FREQUENCY AND DURATION 02/24/2020 Speech Therapy Frequency (ACUTE ONLY) min 3x week Treatment Duration 2 weeks      CHL IP ORAL PHASE 02/24/2020 Oral Phase Impaired Oral - Pudding Teaspoon -- Oral - Pudding Cup -- Oral - Honey Teaspoon -- Oral - Honey Cup -- Oral - Nectar Teaspoon -- Oral - Nectar Cup -- Oral - Nectar Straw Right anterior bolus loss;Weak lingual manipulation;Reduced posterior propulsion;Holding of bolus;Right pocketing in lateral sulci;Pocketing in anterior sulcus;Lingual/palatal residue;Piecemeal swallowing;Decreased bolus cohesion Oral - Thin Teaspoon -- Oral - Thin Cup Right anterior bolus loss;Weak lingual manipulation;Reduced posterior propulsion;Holding of bolus;Right pocketing in lateral sulci;Pocketing in anterior sulcus;Lingual/palatal residue;Piecemeal swallowing;Decreased bolus cohesion Oral - Thin Straw Right anterior bolus loss;Weak lingual manipulation;Reduced posterior propulsion;Holding of bolus;Right pocketing in lateral sulci;Pocketing in anterior sulcus;Lingual/palatal residue;Piecemeal swallowing;Decreased bolus cohesion Oral - Puree Right anterior bolus loss;Weak lingual manipulation;Reduced posterior propulsion;Holding of bolus;Right pocketing in lateral sulci;Pocketing in anterior sulcus;Lingual/palatal residue;Piecemeal swallowing;Decreased bolus cohesion Oral - Mech Soft -- Oral - Regular Right anterior bolus loss;Weak lingual  manipulation;Reduced posterior propulsion;Holding of bolus;Right pocketing in lateral sulci;Pocketing in anterior sulcus;Lingual/palatal residue;Piecemeal swallowing;Decreased bolus cohesion Oral - Multi-Consistency -- Oral - Pill -- Oral Phase - Comment --  CHL IP PHARYNGEAL PHASE 02/24/2020 Pharyngeal Phase Impaired Pharyngeal- Pudding Teaspoon -- Pharyngeal -- Pharyngeal- Pudding Cup -- Pharyngeal -- Pharyngeal- Honey Teaspoon -- Pharyngeal -- Pharyngeal- Honey Cup -- Pharyngeal -- Pharyngeal-  Nectar Teaspoon -- Pharyngeal -- Pharyngeal- Nectar Cup -- Pharyngeal -- Pharyngeal- Nectar Straw Delayed swallow initiation-pyriform sinuses;Delayed swallow initiation-vallecula;Penetration/Aspiration before swallow Pharyngeal Material enters airway, CONTACTS cords and not ejected out Pharyngeal- Thin Teaspoon -- Pharyngeal -- Pharyngeal- Thin Cup Delayed swallow initiation-pyriform sinuses;Delayed swallow initiation-vallecula Pharyngeal -- Pharyngeal- Thin Straw Delayed swallow initiation-vallecula;Delayed swallow initiation-pyriform sinuses Pharyngeal -- Pharyngeal- Puree Delayed swallow initiation-vallecula;Delayed swallow initiation-pyriform sinuses Pharyngeal -- Pharyngeal- Mechanical Soft -- Pharyngeal -- Pharyngeal- Regular Delayed swallow initiation-vallecula Pharyngeal -- Pharyngeal- Multi-consistency -- Pharyngeal -- Pharyngeal- Pill -- Pharyngeal -- Pharyngeal Comment --  CHL IP CERVICAL ESOPHAGEAL PHASE 02/24/2020 Cervical Esophageal Phase WFL Pudding Teaspoon -- Pudding Cup -- Honey Teaspoon -- Honey Cup -- Nectar Teaspoon -- Nectar Cup -- Nectar Straw -- Thin Teaspoon -- Thin Cup -- Thin Straw -- Puree -- Mechanical Soft -- Regular -- Multi-consistency -- Pill -- Cervical Esophageal Comment -- Blenda MountsCouture, Amanda Laurice 02/24/2020, 2:58 PM              ECHOCARDIOGRAM COMPLETE  Result Date: 02/24/2020    ECHOCARDIOGRAM REPORT   Patient Name:   Timmothy SoursRAYMOND C Eaker Date of Exam: 02/24/2020 Medical Rec #:  [id number]           Height:       67.0 in Accession #:    [id number]         Weight:       175.5 lb Date of Birth:  Apr 04, 1961          BSA:          1.913 m Patient Age:    59 years           BP:           159/96 mmHg Patient Gender: M                  HR:           106 bpm. Exam Location:  Inpatient Procedure: 2D Echo and Intracardiac Opacification Agent Indications:    Stroke 434.91 / I163.9  History:        Patient has no prior history of Echocardiogram examinations.                 Peripheral arterial disease, underwent stenting, complicated by                 thrombosis requiring thrombectomy and LLE fasciotomy. Developed                 left facial droop. 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  Sonographer:    Leta Junglingiffany Cooper RDCS Referring Phys: [id number] JINDONG XU IMPRESSIONS  1. Left ventricular ejection fraction, by estimation, is 60 to 65%. Left ventricular ejection fraction by 2D MOD biplane is 63.2 %. The left ventricle has normal function. The left ventricle has no regional wall motion abnormalities. Left ventricular diastolic parameters are indeterminate.  2. Right ventricular systolic function is normal. The right ventricular size is normal. There is normal pulmonary artery systolic pressure. The estimated right ventricular systolic pressure is 30.9 mmHg.  3. The mitral valve is grossly normal. No evidence of mitral valve regurgitation. No evidence of mitral stenosis.  4. The aortic valve is tricuspid. Aortic valve regurgitation is not visualized. No aortic stenosis is present.  5. The inferior vena cava is normal in size with greater than 50% respiratory variability, suggesting right atrial pressure of 3 mmHg. Conclusion(s)/Recommendation(s): No intracardiac source of embolism detected on this transthoracic study. A transesophageal echocardiogram is recommended  to exclude cardiac source of embolism if clinically indicated. FINDINGS  Left Ventricle: Left ventricular ejection fraction, by estimation, is 60 to 65%. Left ventricular  ejection fraction by 2D MOD biplane is 63.2 %. The left ventricle has normal function. The left ventricle has no regional wall motion abnormalities. Definity contrast agent was given IV to delineate the left ventricular endocardial borders. The left ventricular internal cavity size was normal in size. There is no left ventricular hypertrophy. Left ventricular diastolic parameters are indeterminate. Right Ventricle: The right ventricular size is normal. No increase in right ventricular wall thickness. Right ventricular systolic function is normal. There is normal pulmonary artery systolic pressure. The tricuspid regurgitant velocity is 2.64 m/s, and  with an assumed right atrial pressure of 3 mmHg, the estimated right ventricular systolic pressure is 30.9 mmHg. Left Atrium: Left atrial size was normal in size. Right Atrium: Right atrial size was normal in size. Pericardium: Trivial pericardial effusion is present. Presence of pericardial fat pad. Mitral Valve: The mitral valve is grossly normal. No evidence of mitral valve regurgitation. No evidence of mitral valve stenosis. Tricuspid Valve: The tricuspid valve is grossly normal. Tricuspid valve regurgitation is trivial. No evidence of tricuspid stenosis. Aortic Valve: The aortic valve is tricuspid. Aortic valve regurgitation is not visualized. No aortic stenosis is present. Pulmonic Valve: The pulmonic valve was grossly normal. Pulmonic valve regurgitation is not visualized. No evidence of pulmonic stenosis. Aorta: The aortic root is normal in size and structure. Venous: The inferior vena cava is normal in size with greater than 50% respiratory variability, suggesting right atrial pressure of 3 mmHg. IAS/Shunts: The atrial septum is grossly normal.  LEFT VENTRICLE PLAX 2D                        Biplane EF (MOD) LVIDd:         4.90 cm         LV Biplane EF:   Left LVIDs:         3.60 cm                          ventricular LV PW:         0.90 cm                           ejection LV IVS:        1.10 cm                          fraction by LVOT diam:     1.90 cm                          2D MOD LV SV:         43                               biplane is LV SV Index:   22                               63.2 %. LVOT Area:     2.84 cm  Diastology                                LV e' lateral:   8.93 cm/s LV Volumes (MOD)               LV E/e' lateral: 11.4 LV vol d, MOD    98.1 ml       LV e' medial:    6.08 cm/s A2C:                           LV E/e' medial:  16.8 LV vol d, MOD    88.1 ml A4C: LV vol s, MOD    53.3 ml A2C: LV vol s, MOD    23.4 ml A4C: LV SV MOD A2C:   44.8 ml LV SV MOD A4C:   88.1 ml LV SV MOD BP:    60.6 ml RIGHT VENTRICLE RV S prime:     11.40 cm/s TAPSE (M-mode): 1.9 cm LEFT ATRIUM             Index       RIGHT ATRIUM           Index LA diam:        3.90 cm 2.04 cm/m  RA Area:     13.40 cm LA Vol (A2C):   50.2 ml 26.24 ml/m RA Volume:   34.30 ml  17.93 ml/m LA Vol (A4C):   63.9 ml 33.40 ml/m LA Biplane Vol: 58.4 ml 30.53 ml/m  AORTIC VALVE LVOT Vmax:   88.30 cm/s LVOT Vmean:  53.100 cm/s LVOT VTI:    0.151 m  AORTA Ao Root diam: 3.30 cm MITRAL VALVE                TRICUSPID VALVE MV Area (PHT): 7.16 cm     TR Peak grad:   27.9 mmHg MV Decel Time: 106 msec     TR Vmax:        264.00 cm/s MV E velocity: 102.00 cm/s MV A velocity: 49.20 cm/s   SHUNTS MV E/A ratio:  2.07         Systemic VTI:  0.15 m                             Systemic Diam: 1.90 cm Lennie Odor MD Electronically signed by Lennie Odor MD Signature Date/Time: 02/24/2020/1:09:12 PM    Final    CT HEAD CODE STROKE WO CONTRAST  Result Date: 02/23/2020 CLINICAL DATA:  Code stroke. Neuro deficit, acute, stroke suspected. Mental status change at 11 a.m., last known well 8 a.m. this morning. EXAM: CT HEAD WITHOUT CONTRAST TECHNIQUE: Contiguous axial images were obtained from the base of the skull through the vertex without intravenous contrast. COMPARISON:  Brain MRI  09/17/2018 FINDINGS: Brain: There is a large acute demarcated ischemic infarct within the left MCA vascular territory affecting the left frontal, temporal and parietal lobes as well as left insula and subinsular region. The left caudate and lentiform nuclei are also affected. No evidence of hemorrhagic conversion. There is mild associated mass effect with 3 mm rightward midline shift. Redemonstrated superimposed small chronic infarcts within the left cerebral hemisphere. Background mild generalized parenchymal atrophy and chronic small vessel ischemic disease. No extra-axial fluid collection. No evidence of intracranial mass. Vascular: No hyperdense vessel Skull: Atherosclerotic calcifications. Sinuses/Orbits: Visualized  orbits show no acute finding. Mild ethmoid sinus mucosal thickening. No significant mastoid effusion. ASPECTS [facility] Stroke Program Early CT Score) - Ganglionic level infarction (caudate, lentiform nuclei, internal capsule, insula, M1-M3 cortex): 2 - Supraganglionic infarction (M4-M6 cortex): 2 Total score (0-10 with 10 being normal): 4 These results were called by telephone at the time of interpretation on 02/23/2020 at 12:20 pm to provider Erick Blinks, who verbally acknowledged these results. IMPRESSION: Large acute demarcated left MCA vascular territory as described. ASPECTS is 4. No evidence of hemorrhagic conversion. Mild mass effect with 3 mm rightward midline shift. Stable background mild generalized parenchymal atrophy and chronic small vessel ischemic disease. Superimposed small chronic infarcts within the left cerebral hemisphere. Electronically Signed   By: Jackey Loge DO   On: 02/23/2020 12:22   CT ANGIO HEAD CODE STROKE  Result Date: 02/23/2020 CLINICAL DATA:  Neuro deficit, acute, stroke suspected. EXAM: CT ANGIOGRAPHY HEAD AND NECK CT PERFUSION BRAIN TECHNIQUE: Multidetector CT imaging of the head and neck was performed using the standard protocol during bolus administration  of intravenous contrast. Multiplanar CT image reconstructions and MIPs were obtained to evaluate the vascular anatomy. Carotid stenosis measurements (when applicable) are obtained utilizing NASCET criteria, using the distal internal carotid diameter as the denominator. Multiphase CT imaging of the brain was performed following IV bolus contrast injection. Subsequent parametric perfusion maps were calculated using RAPID software. CONTRAST:  OMNIPAQUE IOHEXOL 350 MG/ML SOLN COMPARISON:  Noncontrast head CT performed earlier the same day. FINDINGS: CTA NECK FINDINGS Aortic arch: The left vertebral artery arises directly from the aortic arch. Atherosclerotic plaque within the visualized aortic arch and proximal major branch vessels of the neck. No hemodynamically significant innominate or proximal subclavian artery stenosis. Right carotid system: The CCA and ICA are patent within the neck. Atherosclerotic plaque within these vessels. Most notably there is at least moderate calcified plaque within the carotid bifurcation. Exact quantification of stenosis within the proximal right ICA is difficult due to motion artifact and blooming artifact of calcified plaque at this site. Additionally, there is 50-60% stenosis of the mid cervical ICA. Left carotid system: The CCA is patent to the bifurcation. 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 Vertebral arteries: The dominant right vertebral artery is patent throughout the neck. Moderate/severe atherosclerotic narrowing within the V1 right vertebral artery. The non dominant left vertebral artery is developmentally diminutive, but patent throughout the neck Skeleton: Reversal of the expected cervical lordosis. Cervical spondylosis with multilevel disc space narrowing, posterior disc osteophytes and uncovertebral hypertrophy. Other neck: No neck mass or cervical lymphadenopathy. Upper chest: No consolidation within the imaged  lung apices. 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 There is minimal reconstitution of flow within the supraclinoid left ICA. The M1 and M2 left middle cerebral arteries are patent, although with asymmetrically decreased opacification as compared to the right side. Additionally, there is a moderate to moderately severe focal stenosis within a superior division proximal M2 left MCA branch (series 8, image 17). The intracranial right internal carotid artery is patent. However, there are sites of severe stenosis within the cavernous and paraclinoid segments. The M1 right middle cerebral artery is patent without significant stenosis. No right M2 proximal branch occlusion is identified. There is a moderate to moderately severe  stenosis within an inferior division mid M2 right MCA branch vessel (series 9, image 19). The A1 left anterior cerebral artery is hypoplastic. The A1 right anterior cerebral artery is dominant. The A2 and more distal anterior cerebral arteries are patent. However, there are multifocal high-grade stenoses within the bilateral A2 anterior cerebral arteries. Posterior circulation: The non dominant intracranial left vertebral artery is developmentally diminutive, but patent and terminates as the left PICA. The dominant intracranial right vertebral artery is patent, although with sites of moderate stenosis. The basilar artery is patent. The posterior cerebral arteries are patent. However, there is a high-grade focal stenosis within the P2 left PCA and high-grade focal stenosis within the right PCA at the P2/P3 junction. Venous sinuses: Within limitations of contrast timing, no convincing thrombus. Anatomic variants: Posterior communicating arteries are hypoplastic or absent bilaterally. Review  of the MIP images confirms the above findings CT Brain Perfusion Findings: ASPECTS: 4 CBF (<30%) Volume: 89mL (left MCA vascular territory) Perfusion (Tmax>6.0s) volume: (left MCA vascular territory) Mismatch Volume: 60mL Infarction Location:Left MCA vascular territory. These results were called by telephone at the time of interpretation on 02/23/2020 at 12:40 pm to provider [facility] , who verbally acknowledged these results. IMPRESSION: CTA neck: 1. 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 2. There is at least moderate calcified plaque at the right carotid bifurcation. There is stenosis at the origin of the cervical right vertebral artery which is poorly quantified due to motion degradation and blooming artifact from calcified plaque at this site. Carotid artery duplex is recommended for further evaluation. Additionally, there is apparent 50-60% stenosis of the mid cervical right ICA. 3. Moderate/severe atherosclerotic narrowing within the Dom V1 right vertebral artery. 4. The left vertebral artery is developmentally diminutive, but patent throughout the neck. CTA head: 1. The left internal carotid artery remains occluded intracranially throughout the siphon region. There is minimal reconstitution of flow within the supraclinoid left ICA. The M1 and M2 left middle cerebral arteries are patent, although with asymmetrically decreased opacification as compared to the right side. Additionally, there is a moderate to moderately severe focal stenosis within a superior division proximal M2 left MCA branch. 2. Additional intracranial atherosclerotic disease with multifocal stenoses, most notably as follows. 3. High-grade stenoses within the cavernous/paraclinoid right ICA. 4. Moderate to moderately severe focal stenosis within an inferior division mid M2 right MCA branch vessel. 5. Moderate stenosis within the V4 right vertebral artery. 6.  High-grade stenosis within the P2 left PCA. 7. High-grade stenosis within the right PCA at the P2/P3 junction. 8. Multifocal high-grade stenoses within the A2 anterior cerebral arteries bilaterally. CT perfusion head: The perfusion software identifies an 89 mL core infarct within the left MCA vascular territory. The perfusion software identifies a 149 mL region of critically hypoperfused parenchyma within the left MCA vascular territory. Reported mismatch volume 60 mL. Electronically Signed   By: Jackey Loge DO   On: 02/23/2020 12:53   CT ANGIO NECK CODE STROKE  Result Date: 02/23/2020 CLINICAL DATA:  Neuro deficit, acute, stroke suspected. EXAM: CT ANGIOGRAPHY HEAD AND NECK CT PERFUSION BRAIN TECHNIQUE: Multidetector CT imaging of the head and neck was performed using the standard protocol during bolus administration of intravenous contrast. Multiplanar CT image reconstructions and MIPs were obtained to evaluate the vascular anatomy. Carotid stenosis measurements (when applicable) are obtained utilizing NASCET criteria, using the distal internal carotid diameter as the denominator. Multiphase CT imaging of  the brain was performed following IV bolus contrast injection. Subsequent parametric perfusion maps were calculated using RAPID software. CONTRAST:  OMNIPAQUE IOHEXOL 350 MG/ML SOLN COMPARISON:  Noncontrast head CT performed earlier the same day. FINDINGS: CTA NECK FINDINGS Aortic arch: The left vertebral artery arises directly from the aortic arch. Atherosclerotic plaque within the visualized aortic arch and proximal major branch vessels of the neck. No hemodynamically significant innominate or proximal subclavian artery stenosis. Right carotid system: The CCA and ICA are patent within the neck. Atherosclerotic plaque within these vessels. Most notably there is at least moderate calcified plaque within the carotid bifurcation. Exact quantification of stenosis within the proximal right ICA is difficult  due to motion artifact and blooming artifact of calcified plaque at this site. Additionally, there is 50-60% stenosis of the mid cervical ICA. Left carotid system: The CCA is patent to the bifurcation. 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 Vertebral arteries: The dominant right vertebral artery is patent throughout the neck. Moderate/severe atherosclerotic narrowing within the V1 right vertebral artery. The non dominant left vertebral artery is developmentally diminutive, but patent throughout the neck Skeleton: Reversal of the expected cervical lordosis. Cervical spondylosis with multilevel disc space narrowing, posterior disc osteophytes and uncovertebral hypertrophy. Other neck: No neck mass or cervical lymphadenopathy. Upper chest: No consolidation within the imaged lung apices. 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 There is minimal reconstitution of flow within the supraclinoid left ICA. The M1 and M2 left middle cerebral arteries are patent, although with asymmetrically decreased opacification as compared to the right side. Additionally, there is a moderate to moderately severe focal stenosis within a superior division proximal M2 left MCA branch (series 8, image 17). The intracranial right internal carotid artery is patent. However, there are sites of severe stenosis within the cavernous and paraclinoid segments. The M1 right middle cerebral artery is patent without significant stenosis. No right M2 proximal branch occlusion is identified. There is a moderate to moderately severe stenosis within an inferior division mid M2 right MCA branch vessel (series 9, image 19). The A1 left anterior cerebral artery is hypoplastic. The A1 right anterior  cerebral artery is dominant. The A2 and more distal anterior cerebral arteries are patent. However, there are multifocal high-grade stenoses within the bilateral A2 anterior cerebral arteries. Posterior circulation: The non dominant intracranial left vertebral artery is developmentally diminutive, but patent and terminates as the left PICA. The dominant intracranial right vertebral artery is patent, although with sites of moderate stenosis. The basilar artery is patent. The posterior cerebral arteries are patent. However, there is a high-grade focal stenosis within the P2 left PCA and high-grade focal stenosis within the right PCA at the P2/P3 junction. Venous sinuses: Within limitations of contrast timing, no convincing thrombus. Anatomic variants: Posterior communicating arteries are hypoplastic or absent bilaterally. Review of the MIP images confirms the above findings CT Brain Perfusion Findings: ASPECTS: 4 CBF (<30%) Volume: 89mL (left MCA vascular territory) Perfusion (Tmax>6.0s) volume: (left MCA vascular territory) Mismatch Volume: 60mL Infarction Location:Left MCA vascular territory. These results were called by telephone at the time of interpretation on 02/23/2020 at 12:40 pm to provider [facility] , who verbally acknowledged these results. IMPRESSION: CTA neck: 1. The cervical left ICA is occluded from its  origin and remains occluded throughout the remainder of the neck. 2. There is at least moderate calcified plaque at the right carotid bifurcation. There is stenosis at the origin of the cervical right vertebral artery which is poorly quantified due to motion degradation and blooming artifact from calcified plaque at this site. Carotid artery duplex is recommended for further evaluation. Additionally, there is apparent 50-60% stenosis of the mid cervical right ICA. 3. Moderate/severe atherosclerotic narrowing within the Dom V1 right vertebral artery. 4. The left vertebral artery is  developmentally diminutive, but patent throughout the neck. CTA head: 1. The left internal carotid artery remains occluded intracranially throughout the siphon region. There is minimal reconstitution of flow within the supraclinoid left ICA. The M1 and M2 left middle cerebral arteries are patent, although with asymmetrically decreased opacification as compared to the right side. Additionally, there is a moderate to moderately severe focal stenosis within a superior division proximal M2 left MCA branch. 2. Additional intracranial atherosclerotic disease with multifocal stenoses, most notably as follows. 3. High-grade stenoses within the cavernous/paraclinoid right ICA. 4. Moderate to moderately severe focal stenosis within an inferior division mid M2 right MCA branch vessel. 5. Moderate stenosis within the V4 right vertebral artery. 6. High-grade stenosis within the P2 left PCA. 7. High-grade stenosis within the right PCA at the P2/P3 junction. 8. Multifocal high-grade stenoses within the A2 anterior cerebral arteries bilaterally. CT perfusion head: The perfusion software identifies an 89 mL core infarct within the left MCA vascular territory. The perfusion software identifies a 149 mL region of critically hypoperfused parenchyma within the left MCA vascular territory. Reported mismatch volume 60 mL. Electronically Signed   By: Jackey Loge DO   On: 02/23/2020 12:53     Time Spent in minutes  30     Laverna Peace M.D on 03/06/2020 at 4:21 PM  To page go to [email or web address] - password [facility]

## 2020-03-06 NOTE — Progress Notes (Signed)
Patient is awaiting disposition out of the hospital. Bilateral groin incisions and left leg fasciotomy incision are healing appropriately.  Both legs are warm and well-perfused.  I have him scheduled for follow-up in the office in a few weeks.  Durene Cal

## 2020-03-06 NOTE — Progress Notes (Signed)
  Speech Language Pathology Treatment: Dysphagia;Cognitive-Linquistic  Patient Details Name: Jonathan [name] MRN: [id number] DOB: 09-22-1960 Today's Date: 03/06/2020 Time: 1000-1033 SLP Time Calculation (min) (ACUTE ONLY): 33 min  Assessment / Plan / Recommendation Clinical Impression  Pt was seen for skilled ST targeting goals for dysphagia and communication.  When consuming pureed textures, pt demonstrated anterior loss of boluses from right sided but tolerated trials of dys 2 and dys 3 textures with no loss of solids and complete clearance of small amounts from the oral cavity without difficulty.  Given limited PO intake during today's session, I would prefer diet advancement after 1 additional session to ensure toleration; however pt appears to be progressing towards diet upgrade to dys 2 textures.   Pt continues to present with severe challenges in functional communication.  He gestures and points often to communicate needs and wants to caregivers but was noted with x1 instance of a spontaneous, appropriate word level response, stating "nothing" when vascular surgeon asked how much pt could do with his affected lower extremity while rounding.  Pt could intermittently alternate between producing two vowel sounds for brief periods but ultimately became limited by perseveration despite max assist verbal, visual, and tactile cues.  Pt appears aware of errors but is unable to correct them yet.  SLP provided simple education with use of augmentative strategies (gestures, written/pictoral aids) to explain the nature of pt's communication challenges.  Pt was left in bed with bed alarm set and call bell within reach.  Continue per current plan of care.    HPI HPI: 59 y.o. male with a history of hypertension, hyperlipidemia, CVA, severe PAD  who presented with acute critical limb ischemia secondary to occlusion of the iliac artery who underwent stenting on 8/6 at [facility].  He was taken back to the OR on 8/7  found to have acute thrombosis of bilateral iliac stents underwent thrombectomy and endarterectomy;  fasciotomies of the left leg. Extubated 8/8. Developed right hemiplegia and aphasia morning of 8/8. CTH: large territory infarct left MCA. Pt transferred to [facility] same day.        SLP Plan  Continue with current plan of care       Recommendations  Diet recommendations: Dysphagia 1 (puree);Thin liquid Liquids provided via: Cup;No straw Medication Administration: Whole meds with puree Supervision: Full supervision/cueing for compensatory strategies Compensations: Minimize environmental distractions;Slow rate;Small sips/bites Postural Changes and/or Swallow Maneuvers: Seated upright 90 degrees;Upright 30-60 min after meal                Oral Care Recommendations: Oral care BID Follow up Recommendations: Skilled Nursing facility (per family preference) SLP Visit Diagnosis: Dysphagia, oropharyngeal phase (R13.12);Aphasia (R47.01) Plan: Continue with current plan of care       GO                Reece Mcbroom, Melanee Spry 03/06/2020, 10:40 AM

## 2020-03-06 NOTE — Plan of Care (Signed)
  Problem: Education: Goal: Knowledge of disease or condition will improve Outcome: Progressing Goal: Knowledge of secondary prevention will improve Outcome: Progressing Goal: Knowledge of patient specific risk factors addressed and post discharge goals established will improve Outcome: Progressing Goal: Individualized Educational Video(s) Outcome: Progressing   Problem: Ischemic Stroke/TIA Tissue Perfusion: Goal: Complications of ischemic stroke/TIA will be minimized Outcome: Progressing   Problem: Nutrition: Goal: Risk of aspiration will decrease Outcome: Progressing Goal: Dietary intake will improve Outcome: Progressing

## 2020-03-07 NOTE — Progress Notes (Signed)
Physical Therapy Treatment Patient Details Name: Jonathan [name] MRN: [id number] DOB: [birth year] Today's Date: 03/07/2020    History of Present Illness Pt is 59 y/o M with PMH: DDD, HTN, HLD, h/o CVA with some acute aphasia (wife reports his speech was normal before this admission), and PAD s/p stent in 2017. Presented with L LE pain. Now s/p angio with PCI and stent placement on 8/6 and revascularization of L LE on 8/7. Extubated 02/23/20. Of note, during OT assessment, significant R sided weakness and significant aphasia present. OT notified RN immediately who called code stroke.    PT Comments    Pt was able to progress OOB to recliner chair today via lateral scoot. Previous sessions limited by bowl incontinence, so adult diaper was donned this session and RN notified.  Pt continues to show signs of expressive and receptive aphasia. He is progressing slowly towards goals. Current plan remains appropriate.    Follow Up Recommendations  SNF (family requesting therapies closer to home in SNF)     Equipment Recommendations  Other (comment) (TBA at next venue)    Recommendations for Other Services       Precautions / Restrictions Precautions Precautions: Fall Precaution Comments: scrotal sling in room with instructions. RUE and RLE flaccid Restrictions Weight Bearing Restrictions: No    Mobility  Bed Mobility Overal bed mobility: Needs Assistance Bed Mobility: Rolling;Sidelying to Sit Rolling: Min assist (with rail; max cues; min to his right, mod to his left) Sidelying to sit: Mod assist;+2 for physical assistance;HOB elevated       General bed mobility comments: pt able to roll to the R well with cues for technique and sequencing. Donned adult diaper in bed due to previous incontinence preventing further therapy in the past.  Transfers Overall transfer level: Needs assistance Equipment used: 2 person hand held assist Transfers: Lateral/Scoot Transfers           Lateral/Scoot Transfers: Max assist;+2 physical assistance (+3 with bed pad) General transfer comment: pt able to perform lateral scoot to drop arm recliner with max A +2. Utalized bed pad under hips and had scrotum in pillow case sling to prevent sheering. VC for hand placement and sequencing  Ambulation/Gait                 Stairs             Wheelchair Mobility    Modified Rankin (Stroke Patients Only) Modified Rankin (Stroke Patients Only) Pre-Morbid Rankin Score: No symptoms Modified Rankin: Severe disability     Balance Overall balance assessment: Needs assistance Sitting-balance support: Single extremity supported;Feet supported Sitting balance-Leahy Scale: Poor Sitting balance - Comments: min to modA for static sitting Postural control: Posterior lean;Right lateral lean Standing balance support: Bilateral upper extremity supported Standing balance-Leahy Scale: Zero Standing balance comment: maxA-maxA x2                            Cognition Arousal/Alertness: Awake/alert Behavior During Therapy: WFL for tasks assessed/performed Overall Cognitive Status: Difficult to assess                               Problem Solving: Difficulty sequencing;Requires verbal cues;Requires tactile cues General Comments: Pt following commands throughout session. Possibly some receptive aphasia, did not seem to understand what was being asked of him when asked if he could sit up in the chair for an hour.  Exercises      General Comments        Pertinent Vitals/Pain Pain Assessment: No/denies pain    Home Living                      Prior Function            PT Goals (current goals can now be found in the care plan section) Acute Rehab PT Goals Patient Stated Goal: none stated PT Goal Formulation: With patient Time For Goal Achievement: 03/12/20 Potential to Achieve Goals: Fair Progress towards PT goals: Progressing toward  goals    Frequency    Min 3X/week      PT Plan Current plan remains appropriate    Co-evaluation              AM-PAC PT "6 Clicks" Mobility   Outcome Measure  Help needed turning from your back to your side while in a flat bed without using bedrails?: A Lot Help needed moving from lying on your back to sitting on the side of a flat bed without using bedrails?: A Lot Help needed moving to and from a bed to a chair (including a wheelchair)?: A Lot Help needed standing up from a chair using your arms (e.g., wheelchair or bedside chair)?: Total Help needed to walk in hospital room?: Total Help needed climbing 3-5 steps with a railing? : Total 6 Click Score: 9    End of Session Equipment Utilized During Treatment: Gait belt Activity Tolerance: Patient tolerated treatment well Patient left: with call bell/phone within reach;with bed alarm set;in chair Nurse Communication: Mobility status;Need for lift equipment PT Visit Diagnosis: Unsteadiness on feet (R26.81);Other abnormalities of gait and mobility (R26.89);Muscle weakness (generalized) (M62.81);Difficulty in walking, not elsewhere classified (R26.2);Other symptoms and signs involving the nervous system (R29.898);Pain Pain - part of body:  (scrotum)     Time: 6144-3154 PT Time Calculation (min) (ACUTE ONLY): 29 min  Charges:  $Therapeutic Activity: 23-37 mins                    Kallie Locks, [place] Pager [phone] Acute Rehab  Sheral Apley 03/07/2020, 3:09 PM

## 2020-03-07 NOTE — Progress Notes (Signed)
[facility]  PROGRESS NOTE  Jonathan [name] NFA:[id number] DOB: 09/15/1960 DOA: 02/23/2020 PCP: Dione Housekeeper, MD Admit date - 02/23/2020   Admitting Physician Odette Fraction, MD  Outpatient Primary MD for the patient is Zada Finders Joycie Peek, MD  LOS - 13 Brief Narrative   Jonathan [name] is a 59 y.o. year old male with medical history significant for HTN, HLD, chronic smoking, significant peripheral artery disease who presented on 02/23/2020 worsening left leg pain for the past month and was admitted for high concern for acute limb ischemia, patient underwent emergent angiogram and stent placement and bilateral iliac arteries and external iliac arteries, course complicated by acute thrombosis of iliac stents requiring thrombectomy on 8/7 argatroban drip was also started due to concern for heparin-induced thrombocytopenia.  On 8/8 in setting of new slurred speech and right-sided weakness CT head was obtained which showed left-sided MCA he was transferred to [facility] from [facility] for neuro ICU monitoring due to concern for malignant cerebral edema   Subjective  Today he has no complaints.  Continues to want to lay flat  A & P   Acute left MCA stroke, left ICA occlusion.  Large left MCA stroke with CT head, repeat CT head on 8/13 showed stable MCA and improved midline shift .  Continues to have right-sided hemiplegia and receptive/expressive aphasia -Continue Eliquis, Plavix and Lipitor -Follow up with Dr. Myra Gianotti as outpatient in a few weeks arranged  Cerebral edema in setting of above.  Briefly required hypertonic normal saline during hospital course that led to some hypernatremia.  No longer on hypertonic saline, sodium levels within normal limits, repeat CT scan shows stable cerebral edema without worsening midline shift   Acute limb ischemia, status post angioplasty and stent placement.  Complicated by thrombectomy/endarterectomy with fasciotomy -Continue Eliquis and  Plavix  Concern for HIT, ruled out.  HIT panel negative. -Continue to monitor on Eliquis  Atrial fibrillation with RVR, new onset.  Currently rate controlled.  Briefly required Cardizem drip. -Normal rate on metoprolol 75 mg twice daily -TTE with preserved EF -Currently on Eliquis  Scrotal edema, stable -Continue to monitor -Scrotal sling  Elevated troponin, suspect demand ischemia troponin elevated 2000 2059 down to 2227, EF remains preserved with no wall motion abnormalities, and no ischemic changes on EKG.  Likely demand ischemia in the setting of A. fib and CVA  Dysphagia secondary to stroke.  No aspiration events in the hospital Has rhonchorous breath sounds -Speech recommends dysphagia 1 diet with thin liquids  -Aspiration precautions-encourage incentive spirometer, flutter valve, continue scheduled Mucinex  HTN, stable -Continue metoprolol, lisinopril, amlodipine  HLD, stable -Continue Lipitor  Transaminitis, continue trending -Okay to continue Lipitor  Acute blood loss anemia in setting of operative intervention as mentioned above -Hemoglobin stable-transfuse if hemoglobin less than 7  Hypokalemia -Improved with oral replacement      Family Communication  : None  Code Status : Full  Disposition Plan  :  Patient is from home. Anticipated d/c date:  1-2 days. Barriers to d/c or necessity for inpatient status:  Medically stable for discharge, currently awaiting SNF placement Consults  : Vascular surgery, neurology  Procedures  :    DVT Prophylaxis  : Eliquis, Plavix  Lab Results  Component Value Date   PLT 361 03/04/2020    Diet :  Diet Order            DIET - DYS 1 Room service appropriate? No; Fluid consistency: Thin  Diet effective now  Inpatient Medications Scheduled Meds: . amLODipine  10 mg Oral Daily  . apixaban  5 mg Oral BID  . atorvastatin  80 mg Oral Daily  . chlorhexidine  15 mL Mouth Rinse BID  . Chlorhexidine  Gluconate Cloth  6 each Topical Daily  . clopidogrel  75 mg Oral Daily  . feeding supplement (ENSURE ENLIVE)  237 mL Oral BID BM  . folic acid  1 mg Oral Daily  . guaiFENesin  600 mg Oral BID  . lisinopril  40 mg Oral Daily  . mouth rinse  15 mL Mouth Rinse q12n4p  . metoprolol tartrate  75 mg Oral BID  . multivitamin with minerals  1 tablet Oral Daily  . nicotine  21 mg Transdermal Q2000  . pantoprazole  40 mg Oral Q2000  . potassium chloride  40 mEq Oral Daily   Continuous Infusions: . sodium chloride Stopped (02/25/20 1233)   PRN Meds:.sodium chloride, acetaminophen, albuterol, docusate sodium, ipratropium-albuterol, labetalol, ondansetron (ZOFRAN) IV, polyethylene glycol  Antibiotics  :   Anti-infectives (From admission, onward)   None       Objective   Vitals:   03/07/20 0021 03/07/20 0416 03/07/20 0500 03/07/20 0800  BP: 132/64 127/65  127/68  Pulse: (!) 56 (!) 58  63  Resp: 20 20  16   Temp: 97.7 F (36.5 C) 98.5 F (36.9 C)  98.4 F (36.9 C)  TempSrc: Oral Oral  Oral  SpO2: 99% 97%  97%  Weight:   80 kg     SpO2: 97 % O2 Flow Rate (L/min): 2 L/min FiO2 (%): 21 %  Wt Readings from Last 3 Encounters:  03/07/20 80 kg  02/21/20 79.6 kg  02/21/20 79.4 kg     Intake/Output Summary (Last 24 hours) at 03/07/2020 1350 Last data filed at 03/07/2020 1211 Gross per 24 hour  Intake 480 ml  Output 1800 ml  Net -1320 ml    Physical Exam:     Awake Alert, Oriented X 3, Normal affect Right sided hemiplegia, expressive/receptive aphasia present, follows commands with the left side Scrotal swelling present without tenderness [place].AT, Normal respiratory effort on room air, occasional wheezing heard RRR,No Gallops,Rubs or new Murmurs,  +ve B.Sounds, Abd Soft, No tenderness, No rebound, guarding or rigidity. Staples in place on the left lower anterior leg    I have personally reviewed the following:   Data Reviewed:  CBC Recent Labs  Lab 03/01/20 0803  03/03/20 0153 03/04/20 0137  WBC 11.3* 11.7* 12.2*  HGB 10.9* 10.5* 10.4*  HCT 33.0* 32.4* 32.5*  PLT 317 330 361  MCV 94.6 94.2 94.2  MCH 31.2 30.5 30.1  MCHC 33.0 32.4 32.0  RDW 12.9 12.5 12.7  LYMPHSABS 1.5 1.2 1.4  MONOABS 1.0 1.0 1.1*  EOSABS 0.1 0.1 0.1  BASOSABS 0.0 0.0 0.0    Chemistries  Recent Labs  Lab 03/01/20 0803 03/03/20 0153 03/04/20 0137  NA 141 136 137  K 3.1* 3.2* 3.5  CL 106 103 104  CO2 26 25 25   GLUCOSE 131* 135* 138*  BUN 13 10 17   CREATININE 0.68 0.64 0.65  CALCIUM 8.5* 8.4* 8.5*  MG 2.1  --   --   AST 35  --   --   ALT 62*  --   --   ALKPHOS 43  --   --   BILITOT 0.9  --   --    ------------------------------------------------------------------------------------------------------------------ No results for input(s): CHOL, HDL, LDLCALC, TRIG, CHOLHDL, LDLDIRECT in the last 72  hours.  Lab Results  Component Value Date   HGBA1C 6.2 (H) 02/23/2020   ------------------------------------------------------------------------------------------------------------------ No results for input(s): TSH, T4TOTAL, T3FREE, THYROIDAB in the last 72 hours.  Invalid input(s): FREET3 ------------------------------------------------------------------------------------------------------------------ No results for input(s): VITAMINB12, FOLATE, FERRITIN, TIBC, IRON, RETICCTPCT in the last 72 hours.  Coagulation profile No results for input(s): INR, PROTIME in the last 168 hours.  No results for input(s): DDIMER in the last 72 hours.  Cardiac Enzymes No results for input(s): CKMB, TROPONINI, MYOGLOBIN in the last 168 hours.  Invalid input(s): CK ------------------------------------------------------------------------------------------------------------------ No results found for: BNP  Micro Results No results found for this or any previous visit (from the past 240 hour(s)).  Radiology Reports CT HEAD WO CONTRAST  Result Date: 02/28/2020 CLINICAL DATA:   Stroke, follow-up. EXAM: CT HEAD WITHOUT CONTRAST TECHNIQUE: Contiguous axial images were obtained from the base of the skull through the vertex without intravenous contrast. COMPARISON:  CT head without contrast 02/26/2020, 02/24/2020, and 02/23/2019. FINDINGS: Brain: Large left MCA territory nonhemorrhagic infarct is again noted. Infarct involves the left lentiform nucleus and caudate head. Mass effect is again noted. Midline shift is stable to slightly improved at 4.5 mm. Partial effacement of left lateral ventricle is again noted. Sulcal effacement is stable. No significant extra-axial hemorrhage is present. No significant right-sided infarcts are present. Cerebellar atrophy is stable. No acute infarcts are present in the posterior fossa. Vascular: Atherosclerotic changes are present within the cavernous internal carotid arteries bilaterally. No hyperdense vessel is present. Skull: Calvarium is intact. No focal lytic or blastic lesions are present. No significant extracranial soft tissue lesion is present. Sinuses/Orbits: The paranasal sinuses and mastoid air cells are clear. Remote right orbital blowout fracture is stable. Globes and orbits are otherwise within normal limits. IMPRESSION: 1. Stable appearance of large left MCA territory nonhemorrhagic infarct. 2. Stable to slightly improved midline shift. 3. No significant extra-axial hemorrhage. 4. Stable atrophy and white matter disease. Electronically Signed   By: Marin Roberts M.D.   On: 02/28/2020 06:33   CT HEAD WO CONTRAST  Result Date: 02/26/2020 CLINICAL DATA:  Follow-up examination for acute stroke. EXAM: CT HEAD WITHOUT CONTRAST TECHNIQUE: Contiguous axial images were obtained from the base of the skull through the vertex without intravenous contrast. COMPARISON:  Prior CT from 02/24/2020. FINDINGS: Brain: Continued interval evolution of large left MCA territory infarct is seen, overall stable in size and distribution from previous.  Associated regional mass effect with partial effacement of the left lateral ventricle and up to 5 mm of left-to-right shift, slightly worsened from previous. No hydrocephalus or ventricular trapping. Basilar cisterns remain patent. No evidence for hemorrhagic transformation. No other new acute large vessel territory infarct. No intracranial hemorrhage. No extra-axial fluid collection. Vascular: No hyperdense vessel. Scattered vascular calcifications noted within the carotid siphons. Skull: Scalp soft tissues within normal limits. Calvarium unchanged. Sinuses/Orbits: Globes and orbital soft tissues demonstrate no acute finding. Paranasal sinuses and mastoid air cells remain largely clear. Other: None. IMPRESSION: 1. Continued interval evolution of large left MCA territory infarct, overall stable in size and distribution from previous. Associated regional mass effect with up to 5 mm of left-to-right shift, slightly worsened from previous. No hydrocephalus or ventricular trapping. No evidence for hemorrhagic transformation. 2. No other new acute intracranial abnormality. Electronically Signed   By: Rise Mu M.D.   On: 02/26/2020 02:07   CT HEAD WO CONTRAST  Result Date: 02/24/2020 CLINICAL DATA:  Stroke follow-up EXAM: CT HEAD WITHOUT CONTRAST TECHNIQUE: Contiguous axial images  were obtained from the base of the skull through the vertex without intravenous contrast. COMPARISON:  02/23/2020 head CT. FINDINGS: Brain: Sequela of evolving left MCA territory infarct. 3-4 mm rightward midline shift is unchanged. No intracranial hemorrhage. No mass lesion. No ventriculomegaly or extra-axial fluid collection. Vascular: No hyperdense vessel. Bilateral skull base atherosclerotic calcifications. Skull: Negative for fracture or focal lesion. Sinuses/Orbits: Normal orbits. Soft tissue impacted left maxillary molar, unchanged. Mild right sphenoid sinus mucosal thickening. No mastoid effusion. Other: None. IMPRESSION:  Evolving left MCA territory infarct with unchanged 3-4 mm rightward midline shift. No intracranial hemorrhage. Electronically Signed   By: Stana Bunting M.D.   On: 02/24/2020 09:29   CT HEAD WO CONTRAST  Result Date: 02/23/2020 CLINICAL DATA:  Stroke follow-up EXAM: CT HEAD WITHOUT CONTRAST TECHNIQUE: Contiguous axial images were obtained from the base of the skull through the vertex without intravenous contrast. COMPARISON:  Head CT 02/21/2020 FINDINGS: Brain: Large area of cytotoxic edema throughout the left MCA territory. No acute hemorrhage. 3 mm rightward midline shift. Vascular: Atherosclerotic calcification of the internal carotid arteries at the skull base. Skull: Normal. Negative for fracture or focal lesion. Sinuses/Orbits: No acute finding. Other: None. IMPRESSION: 1. Slightly increased cytotoxic edema within the left MCA territory. 2. No acute hemorrhage. 3. 3 mm rightward midline shift. Electronically Signed   By: Deatra Robinson M.D.   On: 02/23/2020 22:00   US Carotid Bilateral  Result Date: 02/23/2020 CLINICAL DATA:  Slurred speech.  Carotid stenosis. EXAM: BILATERAL CAROTID DUPLEX ULTRASOUND TECHNIQUE: Wallace Cullens scale imaging, color Doppler and duplex ultrasound were performed of bilateral carotid and vertebral arteries in the neck. COMPARISON:  CTA neck 02/23/2020 FINDINGS: Criteria: Quantification of carotid stenosis is based on velocity parameters that correlate the residual internal carotid diameter with NASCET-based stenosis levels, using the diameter of the distal internal carotid lumen as the denominator for stenosis measurement. The following velocity measurements were obtained: RIGHT ICA: 117/63 cm/sec CCA: 130/38 cm/sec SYSTOLIC ICA/CCA RATIO:  0.9 ECA: 220 cm/sec LEFT ICA: Occluded CCA: 75/10 cm/sec ECA: 195 cm/sec RIGHT CAROTID ARTERY: Mild atherosclerotic disease in the right common carotid artery. Heterogeneous plaque at the right carotid bulb. External carotid artery is patent  with normal waveform. Atherosclerotic plaque in the proximal internal carotid artery. Normal waveforms and velocities in the internal carotid artery. RIGHT VERTEBRAL ARTERY: Antegrade flow and normal waveform in the right vertebral artery. LEFT CAROTID ARTERY: Left common carotid artery is patent with intimal thickening. Left carotid artery is patent. External carotid artery is patent with normal waveform. 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 No flow in the mid or distal left internal carotid artery. LEFT VERTEBRAL ARTERY: Antegrade flow and normal waveform in the left vertebral artery. IMPRESSION: 1. 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 Findings correspond with recent CTA findings. 2. Atherosclerotic plaque involving the carotid arteries. Estimated degree of stenosis in the right internal carotid artery is less than 50%. 3. Patent vertebral arteries with antegrade flow. Electronically Signed   By: Richarda Overlie M.D.   On: 02/23/2020 14:42   PERIPHERAL VASCULAR CATHETERIZATION  Result Date: 02/21/2020 See op note  CT CEREBRAL PERFUSION W CONTRAST  Result Date: 02/23/2020 CLINICAL DATA:  Neuro deficit, acute, stroke suspected. EXAM: CT ANGIOGRAPHY HEAD AND NECK CT PERFUSION BRAIN TECHNIQUE: Multidetector CT imaging of the head and neck was performed using the standard protocol during bolus administration of intravenous contrast. Multiplanar CT image reconstructions and MIPs were obtained to evaluate the vascular  anatomy. Carotid stenosis measurements (when applicable) are obtained utilizing NASCET criteria, using the distal internal carotid diameter as the denominator. Multiphase CT imaging of the brain was performed following IV bolus contrast injection. Subsequent parametric perfusion maps were calculated using RAPID software. CONTRAST:  OMNIPAQUE IOHEXOL 350 MG/ML SOLN COMPARISON:  Noncontrast head CT performed earlier the same day. FINDINGS: CTA NECK FINDINGS  Aortic arch: The left vertebral artery arises directly from the aortic arch. Atherosclerotic plaque within the visualized aortic arch and proximal major branch vessels of the neck. No hemodynamically significant innominate or proximal subclavian artery stenosis. Right carotid system: The CCA and ICA are patent within the neck. Atherosclerotic plaque within these vessels. Most notably there is at least moderate calcified plaque within the carotid bifurcation. Exact quantification of stenosis within the proximal right ICA is difficult due to motion artifact and blooming artifact of calcified plaque at this site. Additionally, there is 50-60% stenosis of the mid cervical ICA. Left carotid system: The CCA is patent to the bifurcation. 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 Vertebral arteries: The dominant right vertebral artery is patent throughout the neck. Moderate/severe atherosclerotic narrowing within the V1 right vertebral artery. The non dominant left vertebral artery is developmentally diminutive, but patent throughout the neck Skeleton: Reversal of the expected cervical lordosis. Cervical spondylosis with multilevel disc space narrowing, posterior disc osteophytes and uncovertebral hypertrophy. Other neck: No neck mass or cervical lymphadenopathy. Upper chest: No consolidation within the imaged lung apices. 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 There is minimal reconstitution of flow within the supraclinoid left ICA. The M1 and M2 left middle cerebral arteries are patent, although with asymmetrically decreased opacification as compared to the right side. Additionally, there is a moderate to moderately severe focal stenosis within a superior division  proximal M2 left MCA branch (series 8, image 17). The intracranial right internal carotid artery is patent. However, there are sites of severe stenosis within the cavernous and paraclinoid segments. The M1 right middle cerebral artery is patent without significant stenosis. No right M2 proximal branch occlusion is identified. There is a moderate to moderately severe stenosis within an inferior division mid M2 right MCA branch vessel (series 9, image 19). The A1 left anterior cerebral artery is hypoplastic. The A1 right anterior cerebral artery is dominant. The A2 and more distal anterior cerebral arteries are patent. However, there are multifocal high-grade stenoses within the bilateral A2 anterior cerebral arteries. Posterior circulation: The non dominant intracranial left vertebral artery is developmentally diminutive, but patent and terminates as the left PICA. The dominant intracranial right vertebral artery is patent, although with sites of moderate stenosis. The basilar artery is patent. The posterior cerebral arteries are patent. However, there is a high-grade focal stenosis within the P2 left PCA and high-grade focal stenosis within the right PCA at the P2/P3 junction. Venous sinuses: Within limitations of contrast timing, no convincing thrombus. Anatomic variants: Posterior communicating arteries are hypoplastic or absent bilaterally. Review of the MIP images confirms the above findings CT Brain Perfusion Findings: ASPECTS: 4 CBF (<30%) Volume: 89mL (left MCA vascular territory) Perfusion (Tmax>6.0s) volume: (left MCA vascular territory) Mismatch Volume: 60mL Infarction Location:Left MCA vascular territory. These results were called by telephone at the time of interpretation on 02/23/2020 at  12:40 pm to provider [facility] , who verbally acknowledged these results. IMPRESSION: CTA neck: 1. 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  2. There is at least moderate calcified plaque at the right carotid bifurcation. There is stenosis at the origin of the cervical right vertebral artery which is poorly quantified due to motion degradation and blooming artifact from calcified plaque at this site. Carotid artery duplex is recommended for further evaluation. Additionally, there is apparent 50-60% stenosis of the mid cervical right ICA. 3. Moderate/severe atherosclerotic narrowing within the Dom V1 right vertebral artery. 4. The left vertebral artery is developmentally diminutive, but patent throughout the neck. CTA head: 1. The left internal carotid artery remains occluded intracranially throughout the siphon region. There is minimal reconstitution of flow within the supraclinoid left ICA. The M1 and M2 left middle cerebral arteries are patent, although with asymmetrically decreased opacification as compared to the right side. Additionally, there is a moderate to moderately severe focal stenosis within a superior division proximal M2 left MCA branch. 2. Additional intracranial atherosclerotic disease with multifocal stenoses, most notably as follows. 3. High-grade stenoses within the cavernous/paraclinoid right ICA. 4. Moderate to moderately severe focal stenosis within an inferior division mid M2 right MCA branch vessel. 5. Moderate stenosis within the V4 right vertebral artery. 6. High-grade stenosis within the P2 left PCA. 7. High-grade stenosis within the right PCA at the P2/P3 junction. 8. Multifocal high-grade stenoses within the A2 anterior cerebral arteries bilaterally. CT perfusion head: The perfusion software identifies an 89 mL core infarct within the left MCA vascular territory. The perfusion software identifies a 149 mL region of critically hypoperfused parenchyma within the left MCA vascular territory. Reported mismatch volume 60 mL. Electronically Signed   By: Jackey Loge DO   On: 02/23/2020 12:53   DG CHEST PORT 1 VIEW  Result  Date: 02/24/2020 CLINICAL DATA:  59 year old male with shortness of breath. EXAM: PORTABLE CHEST 1 VIEW COMPARISON:  Chest radiograph dated 02/22/2020. FINDINGS: Status post removal of the endotracheal and enteric tube. There is diffuse interstitial densities which may be chronic or represent edema or atypical pneumonia. No large pleural effusion. No pneumothorax. Stable cardiac silhouette. No acute osseous pathology. IMPRESSION: Interval removal of the endotracheal and enteric tubes. Electronically Signed   By: Elgie Collard M.D.   On: 02/24/2020 19:11   Portable Chest x-ray  Result Date: 02/22/2020 CLINICAL DATA:  Left cerebral artery and polyp stroke, postop bilateral iliofemoral thrombectomy, ETT EXAM: PORTABLE CHEST 1 VIEW COMPARISON:  01/12/2013 FINDINGS: Lungs are essentially clear.  No pleural effusion or pneumothorax. The heart is normal in size. Endotracheal tube terminates 5 cm above the carina. Enteric tube terminates in the proximal gastric body. IMPRESSION: Endotracheal tube terminates 5 cm above the carina. Enteric tube terminates in the proximal gastric body. Electronically Signed   By: Charline Bills M.D.   On: 02/22/2020 08:54   DG Swallowing Func-Speech Pathology  Result Date: 02/28/2020 Objective Swallowing Evaluation: Type of Study: MBS-Modified Barium Swallow Study  Patient Details Name: Jonathan [name] MRN: [id number] Date of Birth: 1960/10/25 Today's Date: 02/28/2020 Time: SLP Start Time (ACUTE ONLY): 1457 -SLP Stop Time (ACUTE ONLY): 1513 SLP Time Calculation (min) (ACUTE ONLY): 16 min Past Medical History: Past Medical History: Diagnosis Date . Aphasia S/P CVA  . DDD (degenerative disc disease), cervical  . ED (erectile dysfunction)  . Hyperlipidemia  . Hypertension  . Peripheral arterial disease (HCC)  .  Stroke [facility])  Past Surgical History: Past Surgical History: Procedure Laterality Date . FEMORAL-FEMORAL BYPASS GRAFT Bilateral 02/22/2020  Procedure: bilateral ileofemoral  thrombectomy, left popliteal exposure and thrombectomy, four compartment fasciotomy, left femoral endarterectomy with patch angioplasty;  Surgeon: Nada Libman, MD;  Location: [facility] ORS;  Service: Vascular;  Laterality: Bilateral; . LOWER EXTREMITY ANGIOGRAPHY Left 02/21/2020  Procedure: LOWER EXTREMITY ANGIOGRAPHY;  Surgeon: Renford Dills, MD;  Location: [facility] INVASIVE CV LAB;  Service: Cardiovascular;  Laterality: Left; . PERIPHERAL VASCULAR CATHETERIZATION N/A 07/21/2015  Procedure: Abdominal Aortogram w/Lower Extremity;  Surgeon: Renford Dills, MD;  Location: [facility] INVASIVE CV LAB;  Service: Cardiovascular;  Laterality: N/A; . PERIPHERAL VASCULAR CATHETERIZATION  07/21/2015  Procedure: Lower Extremity Intervention;  Surgeon: Renford Dills, MD;  Location: [facility] INVASIVE CV LAB;  Service: Cardiovascular;; HPI: 59 y.o. male with a history of hypertension, hyperlipidemia, CVA, severe PAD  who presented with acute critical limb ischemia secondary to occlusion of the iliac artery who underwent stenting on 8/6 at [facility].  He was taken back to the OR on 8/7 found to have acute thrombosis of bilateral iliac stents underwent thrombectomy and endarterectomy;  fasciotomies of the left leg. Extubated 8/8. Developed right hemiplegia and aphasia morning of 8/8. CTH: large territory infarct left MCA. Pt transferred to [facility] same day.   Subjective: alert, aphasic Assessment / Plan / Recommendation CHL IP CLINICAL IMPRESSIONS 02/28/2020 Clinical Impression Pt's oropharyngeal swallow is stable compared to MBS completed on 8/9 even with pt self-feeding throughout this study (see that note for more specifics regarding physiology). Pt was allowed to self-feed at his own rate, drinking cups of thin barium without stopping. Across multiple small cupfuls, there was no aspiration. Trace penetration occurred x2 with thin liquids but cleared spontaneously as he continued to swallow additional boluses. Would continue Dys 1 (puree) diet  and thin liquids with full supervision for safety.  SLP Visit Diagnosis Dysphagia, oropharyngeal phase (R13.12) Attention and concentration deficit following -- Frontal lobe and executive function deficit following -- Impact on safety and function Mild aspiration risk   CHL IP TREATMENT RECOMMENDATION 02/28/2020 Treatment Recommendations Therapy as outlined in treatment plan below   Prognosis 02/28/2020 Prognosis for Safe Diet Advancement Good Barriers to Reach Goals Language deficits Barriers/Prognosis Comment -- CHL IP DIET RECOMMENDATION 02/28/2020 SLP Diet Recommendations Dysphagia 1 (Puree) solids;Thin liquid Liquid Administration via Cup;Straw Medication Administration Whole meds with puree Compensations Minimize environmental distractions;Slow rate;Small sips/bites Postural Changes Seated upright at 90 degrees;Remain semi-upright after after feeds/meals (Comment)   CHL IP OTHER RECOMMENDATIONS 02/28/2020 Recommended Consults -- Oral Care Recommendations Oral care BID Other Recommendations Have oral suction available   CHL IP FOLLOW UP RECOMMENDATIONS 02/28/2020 Follow up Recommendations Inpatient Rehab   CHL IP FREQUENCY AND DURATION 02/28/2020 Speech Therapy Frequency (ACUTE ONLY) min 2x/week Treatment Duration 2 weeks      CHL IP ORAL PHASE 02/28/2020 Oral Phase Impaired Oral - Pudding Teaspoon -- Oral - Pudding Cup -- Oral - Honey Teaspoon -- Oral - Honey Cup -- Oral - Nectar Teaspoon -- Oral - Nectar Cup -- Oral - Nectar Straw NT Oral - Thin Teaspoon -- Oral - Thin Cup Right anterior bolus loss;Weak lingual manipulation;Reduced posterior propulsion;Holding of bolus;Right pocketing in lateral sulci;Pocketing in anterior sulcus;Lingual/palatal residue;Piecemeal swallowing;Decreased bolus cohesion Oral - Thin Straw Right anterior bolus loss;Weak lingual manipulation;Reduced posterior propulsion;Holding of bolus;Right pocketing in lateral sulci;Pocketing in anterior sulcus;Lingual/palatal residue;Piecemeal  swallowing;Decreased bolus cohesion Oral - Puree Right anterior bolus loss;Weak lingual manipulation;Reduced posterior propulsion;Holding of  bolus;Right pocketing in lateral sulci;Pocketing in anterior sulcus;Lingual/palatal residue;Piecemeal swallowing;Decreased bolus cohesion Oral - Mech Soft -- Oral - Regular NT Oral - Multi-Consistency -- Oral - Pill -- Oral Phase - Comment --  CHL IP PHARYNGEAL PHASE 02/28/2020 Pharyngeal Phase Impaired Pharyngeal- Pudding Teaspoon -- Pharyngeal -- Pharyngeal- Pudding Cup -- Pharyngeal -- Pharyngeal- Honey Teaspoon -- Pharyngeal -- Pharyngeal- Honey Cup -- Pharyngeal -- Pharyngeal- Nectar Teaspoon -- Pharyngeal -- Pharyngeal- Nectar Cup -- Pharyngeal -- Pharyngeal- Nectar Straw NT Pharyngeal -- Pharyngeal- Thin Teaspoon -- Pharyngeal -- Pharyngeal- Thin Cup Delayed swallow initiation-pyriform sinuses;Delayed swallow initiation-vallecula Pharyngeal -- Pharyngeal- Thin Straw Delayed swallow initiation-pyriform sinuses;Delayed swallow initiation-vallecula Pharyngeal -- Pharyngeal- Puree Delayed swallow initiation-pyriform sinuses;Delayed swallow initiation-vallecula Pharyngeal -- Pharyngeal- Mechanical Soft -- Pharyngeal -- Pharyngeal- Regular NT Pharyngeal -- Pharyngeal- Multi-consistency -- Pharyngeal -- Pharyngeal- Pill -- Pharyngeal -- Pharyngeal Comment --  CHL IP CERVICAL ESOPHAGEAL PHASE 02/28/2020 Cervical Esophageal Phase WFL Pudding Teaspoon -- Pudding Cup -- Honey Teaspoon -- Honey Cup -- Nectar Teaspoon -- Nectar Cup -- Nectar Straw -- Thin Teaspoon -- Thin Cup -- Thin Straw -- Puree -- Mechanical Soft -- Regular -- Multi-consistency -- Pill -- Cervical Esophageal Comment -- Mahala MenghiniLaura N., M.A. CCC-SLP Acute Rehabilitation Services Pager [phone] Office [phone] 02/28/2020, 4:22 PM              DG Swallowing Func-Speech Pathology  Result Date: 02/24/2020 Objective Swallowing Evaluation: Type of Study: MBS-Modified Barium Swallow Study  Patient Details Name:  Jonathan [name] MRN: [id number] Date of Birth: 1961/01/29 Today's Date: 02/24/2020 Time: SLP Start Time (ACUTE ONLY): 1310 -SLP Stop Time (ACUTE ONLY): 1340 SLP Time Calculation (min) (ACUTE ONLY): 30 min Past Medical History: Past Medical History: Diagnosis Date . Aphasia S/P CVA  . DDD (degenerative disc disease), cervical  . ED (erectile dysfunction)  . Hyperlipidemia  . Hypertension  . Peripheral arterial disease (HCC)  . Stroke [facility](HCC)  Past Surgical History: Past Surgical History: Procedure Laterality Date . FEMORAL-FEMORAL BYPASS GRAFT Bilateral 02/22/2020  Procedure: bilateral ileofemoral thrombectomy, left popliteal exposure and thrombectomy, four compartment fasciotomy, left femoral endarterectomy with patch angioplasty;  Surgeon: Nada LibmanBrabham, Vance W, MD;  Location: [facility] ORS;  Service: Vascular;  Laterality: Bilateral; . LOWER EXTREMITY ANGIOGRAPHY Left 02/21/2020  Procedure: LOWER EXTREMITY ANGIOGRAPHY;  Surgeon: Renford DillsSchnier, Gregory G, MD;  Location: [facility] INVASIVE CV LAB;  Service: Cardiovascular;  Laterality: Left; . PERIPHERAL VASCULAR CATHETERIZATION N/A 07/21/2015  Procedure: Abdominal Aortogram w/Lower Extremity;  Surgeon: Renford DillsGregory G [name], MD;  Location: [facility] INVASIVE CV LAB;  Service: Cardiovascular;  Laterality: N/A; . PERIPHERAL VASCULAR CATHETERIZATION  07/21/2015  Procedure: Lower Extremity Intervention;  Surgeon: Renford DillsGregory G [name], MD;  Location: [facility] INVASIVE CV LAB;  Service: Cardiovascular;; HPI: 59 y.o. male with a history of hypertension, hyperlipidemia, CVA, severe PAD  who presented with acute critical limb ischemia secondary to occlusion of the iliac artery who underwent [facility] 8[facility].  He was taken back to the OR on 8/7 found to have acute thrombosis of bilateral iliac stents underwent thrombectomy and endarterectomy;  fasciotomies of the left leg. Extubated 8/8. Developed right hemiplegia and aphasia morning of 8/8. CTH: large territory infarct left MCA. [facility].    Subjective: alert, aphasic Assessment / Plan / Recommendation CHL IP CLINICAL IMPRESSIONS 02/24/2020 Clinical Impression Pt presents with a primary oral dysphagia c/b impaired bolus cohesion and control over release into the pharynx.  There was decreased sensation on the right, with barium residue in right lateral and anterior  sulci and limited awareness of its presence.  Pharyngeal function was largely intact, with adequate pharyngeal squeeze, no residue post-swallow, and effective laryngeal vestibule closure.  Occasionally, thin and nectar thick barium reached the pyriforms for a delayed period of time before the swallow was triggered.  On other occasions, the swallow trigger was timely.  There was one incident of penetration to the vocal folds when a bolus of nectar thick liquid prematurely escaped the oral cavity and entered the laryngeal vestibule without eliciting a response.  Pt's impaired sensation appears to be the primary concern and puts him at greater risk of aspiration.  For now, recommend starting a dysphagia 1 diet, thin liquids from cup only - NO STRAWS; give meds whole in applesauce/pudding.  Due to pt's impulsivity, difficulty following commands, and poor sensation/awareness on right side of mouth/throat, he will require careful supervision during meals.  SLP will follow for safety/diet progression and therapeutic training.   SLP Visit Diagnosis Dysphagia, oropharyngeal phase (R13.12) Attention and concentration deficit following -- Frontal lobe and executive function deficit following -- Impact on safety and function Mild aspiration risk   CHL IP TREATMENT RECOMMENDATION 02/24/2020 Treatment Recommendations Therapy as outlined in treatment plan below   Prognosis 02/24/2020 Prognosis for Safe Diet Advancement Good Barriers to Reach Goals -- Barriers/Prognosis Comment -- CHL IP DIET RECOMMENDATION 02/24/2020 SLP Diet Recommendations Dysphagia 1 (Puree) solids;Thin liquid Liquid Administration via Cup;No  straw Medication Administration Whole meds with puree Compensations Minimize environmental distractions;Slow rate;Small sips/bites Postural Changes --   CHL IP OTHER RECOMMENDATIONS 02/24/2020 Recommended Consults -- Oral Care Recommendations Oral care BID Other Recommendations --   CHL IP FOLLOW UP RECOMMENDATIONS 02/24/2020 Follow up Recommendations Inpatient Rehab   CHL IP FREQUENCY AND DURATION 02/24/2020 Speech Therapy Frequency (ACUTE ONLY) min 3x week Treatment Duration 2 weeks      CHL IP ORAL PHASE 02/24/2020 Oral Phase Impaired Oral - Pudding Teaspoon -- Oral - Pudding Cup -- Oral - Honey Teaspoon -- Oral - Honey Cup -- Oral - Nectar Teaspoon -- Oral - Nectar Cup -- Oral - Nectar Straw Right anterior bolus loss;Weak lingual manipulation;Reduced posterior propulsion;Holding of bolus;Right pocketing in lateral sulci;Pocketing in anterior sulcus;Lingual/palatal residue;Piecemeal swallowing;Decreased bolus cohesion Oral - Thin Teaspoon -- Oral - Thin Cup Right anterior bolus loss;Weak lingual manipulation;Reduced posterior propulsion;Holding of bolus;Right pocketing in lateral sulci;Pocketing in anterior sulcus;Lingual/palatal residue;Piecemeal swallowing;Decreased bolus cohesion Oral - Thin Straw Right anterior bolus loss;Weak lingual manipulation;Reduced posterior propulsion;Holding of bolus;Right pocketing in lateral sulci;Pocketing in anterior sulcus;Lingual/palatal residue;Piecemeal swallowing;Decreased bolus cohesion Oral - Puree Right anterior bolus loss;Weak lingual manipulation;Reduced posterior propulsion;Holding of bolus;Right pocketing in lateral sulci;Pocketing in anterior sulcus;Lingual/palatal residue;Piecemeal swallowing;Decreased bolus cohesion Oral - Mech Soft -- Oral - Regular Right anterior bolus loss;Weak lingual manipulation;Reduced posterior propulsion;Holding of bolus;Right pocketing in lateral sulci;Pocketing in anterior sulcus;Lingual/palatal residue;Piecemeal swallowing;Decreased bolus  cohesion Oral - Multi-Consistency -- Oral - Pill -- Oral Phase - Comment --  CHL IP PHARYNGEAL PHASE 02/24/2020 Pharyngeal Phase Impaired Pharyngeal- Pudding Teaspoon -- Pharyngeal -- Pharyngeal- Pudding Cup -- Pharyngeal -- Pharyngeal- Honey Teaspoon -- Pharyngeal -- Pharyngeal- Honey Cup -- Pharyngeal -- Pharyngeal- Nectar Teaspoon -- Pharyngeal -- Pharyngeal- Nectar Cup -- Pharyngeal -- Pharyngeal- Nectar Straw Delayed swallow initiation-pyriform sinuses;Delayed swallow initiation-vallecula;Penetration/Aspiration before swallow Pharyngeal Material enters airway, CONTACTS cords and not ejected out Pharyngeal- Thin Teaspoon -- Pharyngeal -- Pharyngeal- Thin Cup Delayed swallow initiation-pyriform sinuses;Delayed swallow initiation-vallecula Pharyngeal -- Pharyngeal- Thin Straw Delayed swallow initiation-vallecula;Delayed swallow initiation-pyriform sinuses Pharyngeal -- Pharyngeal- Puree Delayed swallow initiation-vallecula;Delayed  swallow initiation-pyriform sinuses Pharyngeal -- Pharyngeal- Mechanical Soft -- Pharyngeal -- Pharyngeal- Regular Delayed swallow initiation-vallecula Pharyngeal -- Pharyngeal- Multi-consistency -- Pharyngeal -- Pharyngeal- Pill -- Pharyngeal -- Pharyngeal Comment --  CHL IP CERVICAL ESOPHAGEAL PHASE 02/24/2020 Cervical Esophageal Phase WFL Pudding Teaspoon -- Pudding Cup -- Honey Teaspoon -- Honey Cup -- Nectar Teaspoon -- Nectar Cup -- Nectar Straw -- Thin Teaspoon -- Thin Cup -- Thin Straw -- Puree -- Mechanical Soft -- Regular -- Multi-consistency -- Pill -- Cervical Esophageal Comment -- Blenda Mounts Laurice 02/24/2020, 2:58 PM              ECHOCARDIOGRAM COMPLETE  Result Date: 02/24/2020    ECHOCARDIOGRAM REPORT   Patient Name:   Jonathan [name] Date of Exam: 02/24/2020 Medical Rec #:  [id number]          Height:       67.0 in Accession #:    [id number]         Weight:       175.5 lb Date of Birth:  Nov 25, 1960          BSA:          1.913 m Patient Age:    59 years           BP:            159/96 mmHg Patient Gender: M                  HR:           106 bpm. Exam Location:  Inpatient Procedure: 2D Echo and Intracardiac Opacification Agent Indications:    Stroke 434.91 / I163.9  History:        Patient has no prior history of Echocardiogram examinations.                 Peripheral arterial disease, underwent stenting, complicated by                 thrombosis requiring thrombectomy and LLE fasciotomy. Developed                 left facial droop. 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  Sonographer:    Leta Jungling RDCS Referring Phys: [id number] JINDONG XU IMPRESSIONS  1. Left ventricular ejection fraction, by estimation, is 60 to 65%. Left ventricular ejection fraction by 2D MOD biplane is 63.2 %. The left ventricle has normal function. The left ventricle has no regional wall motion abnormalities. Left ventricular diastolic parameters are indeterminate.  2. Right ventricular systolic function is normal. The right ventricular size is normal. There is normal pulmonary artery systolic pressure. The estimated right ventricular systolic pressure is 30.9 mmHg.  3. The mitral valve is grossly normal. No evidence of mitral valve regurgitation. No evidence of mitral stenosis.  4. The aortic valve is tricuspid. Aortic valve regurgitation is not visualized. No aortic stenosis is present.  5. The inferior vena cava is normal in size with greater than 50% respiratory variability, suggesting right atrial pressure of 3 mmHg. Conclusion(s)/Recommendation(s): No intracardiac source of embolism detected on this transthoracic study. A transesophageal echocardiogram is recommended to exclude cardiac source of embolism if clinically indicated. FINDINGS  Left Ventricle: Left ventricular ejection fraction, by estimation, is 60 to 65%. Left ventricular ejection fraction by 2D MOD biplane is 63.2 %. The left ventricle has normal function. The left ventricle has no regional wall motion abnormalities. Definity contrast agent was  given IV to delineate the left ventricular endocardial borders. The left  ventricular internal cavity size was normal in size. There is no left ventricular hypertrophy. Left ventricular diastolic parameters are indeterminate. Right Ventricle: The right ventricular size is normal. No increase in right ventricular wall thickness. Right ventricular systolic function is normal. There is normal pulmonary artery systolic pressure. The tricuspid regurgitant velocity is 2.64 m/s, and  with an assumed right atrial pressure of 3 mmHg, the estimated right ventricular systolic pressure is 30.9 mmHg. Left Atrium: Left atrial size was normal in size. Right Atrium: Right atrial size was normal in size. Pericardium: Trivial pericardial effusion is present. Presence of pericardial fat pad. Mitral Valve: The mitral valve is grossly normal. No evidence of mitral valve regurgitation. No evidence of mitral valve stenosis. Tricuspid Valve: The tricuspid valve is grossly normal. Tricuspid valve regurgitation is trivial. No evidence of tricuspid stenosis. Aortic Valve: The aortic valve is tricuspid. Aortic valve regurgitation is not visualized. No aortic stenosis is present. Pulmonic Valve: The pulmonic valve was grossly normal. Pulmonic valve regurgitation is not visualized. No evidence of pulmonic stenosis. Aorta: The aortic root is normal in size and structure. Venous: The inferior vena cava is normal in size with greater than 50% respiratory variability, suggesting right atrial pressure of 3 mmHg. IAS/Shunts: The atrial septum is grossly normal.  LEFT VENTRICLE PLAX 2D                        Biplane EF (MOD) LVIDd:         4.90 cm         LV Biplane EF:   Left LVIDs:         3.60 cm                          ventricular LV PW:         0.90 cm                          ejection LV IVS:        1.10 cm                          fraction by LVOT diam:     1.90 cm                          2D MOD LV SV:         43                                biplane is LV SV Index:   22                               63.2 %. LVOT Area:     2.84 cm                                Diastology                                LV e' lateral:   8.93 cm/s LV Volumes (MOD)               LV  E/e' lateral: 11.4 LV vol d, MOD    98.1 ml       LV e' medial:    6.08 cm/s A2C:                           LV E/e' medial:  16.8 LV vol d, MOD    88.1 ml A4C: LV vol s, MOD    53.3 ml A2C: LV vol s, MOD    23.4 ml A4C: LV SV MOD A2C:   44.8 ml LV SV MOD A4C:   88.1 ml LV SV MOD BP:    60.6 ml RIGHT VENTRICLE RV S prime:     11.40 cm/s TAPSE (M-mode): 1.9 cm LEFT ATRIUM             Index       RIGHT ATRIUM           Index LA diam:        3.90 cm 2.04 cm/m  RA Area:     13.40 cm LA Vol (A2C):   50.2 ml 26.24 ml/m RA Volume:   34.30 ml  17.93 ml/m LA Vol (A4C):   63.9 ml 33.40 ml/m LA Biplane Vol: 58.4 ml 30.53 ml/m  AORTIC VALVE LVOT Vmax:   88.30 cm/s LVOT Vmean:  53.100 cm/s LVOT VTI:    0.151 m  AORTA Ao Root diam: 3.30 cm MITRAL VALVE                TRICUSPID VALVE MV Area (PHT): 7.16 cm     TR Peak grad:   27.9 mmHg MV Decel Time: 106 msec     TR Vmax:        264.00 cm/s MV E velocity: 102.00 cm/s MV A velocity: 49.20 cm/s   SHUNTS MV E/A ratio:  2.07         Systemic VTI:  0.15 m                             Systemic Diam: 1.90 cm Lennie Odor MD Electronically signed by Lennie Odor MD Signature Date/Time: 02/24/2020/1:09:12 PM    Final    CT HEAD CODE STROKE WO CONTRAST  Result Date: 02/23/2020 CLINICAL DATA:  Code stroke. Neuro deficit, acute, stroke suspected. Mental status change at 11 a.m., last known well 8 a.m. this morning. EXAM: CT HEAD WITHOUT CONTRAST TECHNIQUE: Contiguous axial images were obtained from the base of the skull through the vertex without intravenous contrast. COMPARISON:  Brain MRI 09/17/2018 FINDINGS: Brain: There is a large acute demarcated ischemic infarct within the left MCA vascular territory affecting the left frontal, temporal and parietal lobes as  well as left insula and subinsular region. The left caudate and lentiform nuclei are also affected. No evidence of hemorrhagic conversion. There is mild associated mass effect with 3 mm rightward midline shift. Redemonstrated superimposed small chronic infarcts within the left cerebral hemisphere. Background mild generalized parenchymal atrophy and chronic small vessel ischemic disease. No extra-axial fluid collection. No evidence of intracranial mass. Vascular: No hyperdense vessel Skull: Atherosclerotic calcifications. Sinuses/Orbits: Visualized orbits show no acute finding. Mild ethmoid sinus mucosal thickening. No significant mastoid effusion. ASPECTS [facility] Stroke Program Early CT Score) - Ganglionic level infarction (caudate, lentiform nuclei, internal capsule, insula, M1-M3 cortex): 2 - Supraganglionic infarction (M4-M6 cortex): 2 Total score (0-10 with 10 being normal): 4 These results were called by telephone at the time  of interpretation on 02/23/2020 at 12:20 pm to provider Erick Blinks, who verbally acknowledged these results. IMPRESSION: Large acute demarcated left MCA vascular territory as described. ASPECTS is 4. No evidence of hemorrhagic conversion. Mild mass effect with 3 mm rightward midline shift. Stable background mild generalized parenchymal atrophy and chronic small vessel ischemic disease. Superimposed small chronic infarcts within the left cerebral hemisphere. Electronically Signed   By: Jackey Loge DO   On: 02/23/2020 12:22   CT ANGIO HEAD CODE STROKE  Result Date: 02/23/2020 CLINICAL DATA:  Neuro deficit, acute, stroke suspected. EXAM: CT ANGIOGRAPHY HEAD AND NECK CT PERFUSION BRAIN TECHNIQUE: Multidetector CT imaging of the head and neck was performed using the standard protocol during bolus administration of intravenous contrast. Multiplanar CT image reconstructions and MIPs were obtained to evaluate the vascular anatomy. Carotid stenosis measurements (when applicable) are  obtained utilizing NASCET criteria, using the distal internal carotid diameter as the denominator. Multiphase CT imaging of the brain was performed following IV bolus contrast injection. Subsequent parametric perfusion maps were calculated using RAPID software. CONTRAST:  OMNIPAQUE IOHEXOL 350 MG/ML SOLN COMPARISON:  Noncontrast head CT performed earlier the same day. FINDINGS: CTA NECK FINDINGS Aortic arch: The left vertebral artery arises directly from the aortic arch. Atherosclerotic plaque within the visualized aortic arch and proximal major branch vessels of the neck. No hemodynamically significant innominate or proximal subclavian artery stenosis. Right carotid system: The CCA and ICA are patent within the neck. Atherosclerotic plaque within these vessels. Most notably there is at least moderate calcified plaque within the carotid bifurcation. Exact quantification of stenosis within the proximal right ICA is difficult due to motion artifact and blooming artifact of calcified plaque at this site. Additionally, there is 50-60% stenosis of the mid cervical ICA. Left carotid system: The CCA is patent to the bifurcation. 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 Vertebral arteries: The dominant right vertebral artery is patent throughout the neck. Moderate/severe atherosclerotic narrowing within the V1 right vertebral artery. The non dominant left vertebral artery is developmentally diminutive, but patent throughout the neck Skeleton: Reversal of the expected cervical lordosis. Cervical spondylosis with multilevel disc space narrowing, posterior disc osteophytes and uncovertebral hypertrophy. Other neck: No neck mass or cervical lymphadenopathy. Upper chest: No consolidation within the imaged lung apices. 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 There is minimal reconstitution of flow within the supraclinoid left ICA. The M1 and M2 left middle cerebral arteries are patent, although with asymmetrically decreased opacification as compared to the right side. Additionally, there is a moderate to moderately severe focal stenosis within a superior division proximal M2 left MCA branch (series 8, image 17). The intracranial right internal carotid artery is patent. However, there are sites of severe stenosis within the cavernous and paraclinoid segments. The M1 right middle cerebral artery is patent without significant stenosis. No right M2 proximal branch occlusion is identified. There is a moderate to moderately severe stenosis within an inferior division mid M2 right MCA branch vessel (series 9, image 19). The A1 left anterior cerebral artery is hypoplastic. The A1 right anterior cerebral artery is dominant. The A2 and more distal anterior cerebral arteries are patent. However, there are multifocal high-grade stenoses within the bilateral A2 anterior cerebral arteries. Posterior circulation: The  non dominant intracranial left vertebral artery is developmentally diminutive, but patent and terminates as the left PICA. The dominant intracranial right vertebral artery is patent, although with sites of moderate stenosis. The basilar artery is patent. The posterior cerebral arteries are patent. However, there is a high-grade focal stenosis within the P2 left PCA and high-grade focal stenosis within the right PCA at the P2/P3 junction. Venous sinuses: Within limitations of contrast timing, no convincing thrombus. Anatomic variants: Posterior communicating arteries are hypoplastic or absent bilaterally. Review of the MIP images confirms the above findings CT Brain Perfusion Findings: ASPECTS: 4 CBF (<30%) Volume: 89mL (left MCA vascular territory) Perfusion (Tmax>6.0s) volume:  (left MCA vascular territory) Mismatch Volume: 60mL Infarction Location:Left MCA vascular territory. These results were called by telephone at the time of interpretation on 02/23/2020 at 12:40 pm to provider [facility] , who verbally acknowledged these results. IMPRESSION: CTA neck: 1. 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 2. There is at least moderate calcified plaque at the right carotid bifurcation. There is stenosis at the origin of the cervical right vertebral artery which is poorly quantified due to motion degradation and blooming artifact from calcified plaque at this site. Carotid artery duplex is recommended for further evaluation. Additionally, there is apparent 50-60% stenosis of the mid cervical right ICA. 3. Moderate/severe atherosclerotic narrowing within the Dom V1 right vertebral artery. 4. The left vertebral artery is developmentally diminutive, but patent throughout the neck. CTA head: 1. The left internal carotid artery remains occluded intracranially throughout the siphon region. There is minimal reconstitution of flow within the supraclinoid left ICA. The M1 and M2 left middle cerebral arteries are patent, although with asymmetrically decreased opacification as compared to the right side. Additionally, there is a moderate to moderately severe focal stenosis within a superior division proximal M2 left MCA branch. 2. Additional intracranial atherosclerotic disease with multifocal stenoses, most notably as follows. 3. High-grade stenoses within the cavernous/paraclinoid right ICA. 4. Moderate to moderately severe focal stenosis within an inferior division mid M2 right MCA branch vessel. 5. Moderate stenosis within the V4 right vertebral artery. 6. High-grade stenosis within the P2 left PCA. 7. High-grade stenosis within the right PCA at the P2/P3 junction. 8. Multifocal high-grade stenoses within the A2 anterior  cerebral arteries bilaterally. CT perfusion head: The perfusion software identifies an 89 mL core infarct within the left MCA vascular territory. The perfusion software identifies a 149 mL region of critically hypoperfused parenchyma within the left MCA vascular territory. Reported mismatch volume 60 mL. Electronically Signed   By: Jackey Loge DO   On: 02/23/2020 12:53   CT ANGIO NECK CODE STROKE  Result Date: 02/23/2020 CLINICAL DATA:  Neuro deficit, acute, stroke suspected. EXAM: CT ANGIOGRAPHY HEAD AND NECK CT PERFUSION BRAIN TECHNIQUE: Multidetector CT imaging of the head and neck was performed using the standard protocol during bolus administration of intravenous contrast. Multiplanar CT image reconstructions and MIPs were obtained to evaluate the vascular anatomy. Carotid stenosis measurements (when applicable) are obtained utilizing NASCET criteria, using the distal internal carotid diameter as the denominator. Multiphase CT imaging of the brain was performed following IV bolus contrast injection. Subsequent parametric perfusion maps were calculated using RAPID software. CONTRAST:  OMNIPAQUE IOHEXOL 350 MG/ML SOLN COMPARISON:  Noncontrast head CT performed earlier the same day. FINDINGS: CTA NECK FINDINGS Aortic arch: The left vertebral artery arises directly from the aortic arch. Atherosclerotic plaque within the visualized  aortic arch and proximal major branch vessels of the neck. No hemodynamically significant innominate or proximal subclavian artery stenosis. Right carotid system: The CCA and ICA are patent within the neck. Atherosclerotic plaque within these vessels. Most notably there is at least moderate calcified plaque within the carotid bifurcation. Exact quantification of stenosis within the proximal right ICA is difficult due to motion artifact and blooming artifact of calcified plaque at this site. Additionally, there is 50-60% stenosis of the mid cervical ICA. Left carotid system: The  CCA is patent to the bifurcation. 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 Vertebral arteries: The dominant right vertebral artery is patent throughout the neck. Moderate/severe atherosclerotic narrowing within the V1 right vertebral artery. The non dominant left vertebral artery is developmentally diminutive, but patent throughout the neck Skeleton: Reversal of the expected cervical lordosis. Cervical spondylosis with multilevel disc space narrowing, posterior disc osteophytes and uncovertebral hypertrophy. Other neck: No neck mass or cervical lymphadenopathy. Upper chest: No consolidation within the imaged lung apices. 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 There is minimal reconstitution of flow within the supraclinoid left ICA. The M1 and M2 left middle cerebral arteries are patent, although with asymmetrically decreased opacification as compared to the right side. Additionally, there is a moderate to moderately severe focal stenosis within a superior division proximal M2 left MCA branch (series 8, image 17). The intracranial right internal carotid artery is patent. However, there are sites of severe stenosis within the cavernous and paraclinoid segments. The M1 right middle cerebral artery is patent without significant stenosis. No right M2 proximal branch occlusion is identified. There is a moderate to moderately severe stenosis within an inferior division mid M2 right MCA branch vessel (series 9, image 19). The A1 left anterior cerebral artery is hypoplastic. The A1 right anterior cerebral artery is dominant. The A2 and more distal anterior cerebral arteries are patent. However, there are multifocal high-grade stenoses within the bilateral A2 anterior  cerebral arteries. Posterior circulation: The non dominant intracranial left vertebral artery is developmentally diminutive, but patent and terminates as the left PICA. The dominant intracranial right vertebral artery is patent, although with sites of moderate stenosis. The basilar artery is patent. The posterior cerebral arteries are patent. However, there is a high-grade focal stenosis within the P2 left PCA and high-grade focal stenosis within the right PCA at the P2/P3 junction. Venous sinuses: Within limitations of contrast timing, no convincing thrombus. Anatomic variants: Posterior communicating arteries are hypoplastic or absent bilaterally. Review of the MIP images confirms the above findings CT Brain Perfusion Findings: ASPECTS: 4 CBF (<30%) Volume: 89mL (left MCA vascular territory) Perfusion (Tmax>6.0s) volume: (left MCA vascular territory) Mismatch Volume: 60mL Infarction Location:Left MCA vascular territory. These results were called by telephone at the time of interpretation on 02/23/2020 at 12:40 pm to provider [facility] , who verbally acknowledged these results. IMPRESSION: CTA neck: 1. 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 2. There is at least moderate calcified plaque at the right carotid bifurcation. There is stenosis at the origin of the cervical right vertebral artery which is poorly quantified due to motion degradation and blooming artifact from calcified plaque at this site. Carotid artery duplex is recommended  for further evaluation. Additionally, there is apparent 50-60% stenosis of the mid cervical right ICA. 3. Moderate/severe atherosclerotic narrowing within the Dom V1 right vertebral artery. 4. The left vertebral artery is developmentally diminutive, but patent throughout the neck. CTA head: 1. The left internal carotid artery remains occluded intracranially throughout the siphon region. There is minimal  reconstitution of flow within the supraclinoid left ICA. The M1 and M2 left middle cerebral arteries are patent, although with asymmetrically decreased opacification as compared to the right side. Additionally, there is a moderate to moderately severe focal stenosis within a superior division proximal M2 left MCA branch. 2. Additional intracranial atherosclerotic disease with multifocal stenoses, most notably as follows. 3. High-grade stenoses within the cavernous/paraclinoid right ICA. 4. Moderate to moderately severe focal stenosis within an inferior division mid M2 right MCA branch vessel. 5. Moderate stenosis within the V4 right vertebral artery. 6. High-grade stenosis within the P2 left PCA. 7. High-grade stenosis within the right PCA at the P2/P3 junction. 8. Multifocal high-grade stenoses within the A2 anterior cerebral arteries bilaterally. CT perfusion head: The perfusion software identifies an 89 mL core infarct within the left MCA vascular territory. The perfusion software identifies a 149 mL region of critically hypoperfused parenchyma within the left MCA vascular territory. Reported mismatch volume 60 mL. Electronically Signed   By: Jackey Loge DO   On: 02/23/2020 12:53     Time Spent in minutes  30     Laverna Peace M.D on 03/07/2020 at 1:50 PM  To page go to [email or web address] - password [email or web address]

## 2020-03-08 NOTE — Progress Notes (Signed)
[facility]  PROGRESS NOTE  Jonathan [name] ZOX:[id number] DOB: [birth year] DOA: 02/23/2020 PCP: Dione Housekeeper, MD Admit date - 02/23/2020   Admitting Physician Odette Fraction, MD  Outpatient Primary MD for the patient is Zada Finders Joycie Peek, MD  LOS - 14 Brief Narrative   Jonathan [name] is a 59 y.o. year old male with medical history significant for HTN, HLD, chronic smoking, significant peripheral artery disease who presented on 02/23/2020 worsening left leg pain for the past month and was admitted for high concern for acute limb ischemia, patient underwent emergent angiogram and stent placement and bilateral iliac arteries and external iliac arteries, course complicated by acute thrombosis of iliac stents requiring thrombectomy on 8/7 argatroban drip was also started due to concern for heparin-induced thrombocytopenia.  On 8/8 in setting of new slurred speech and right-sided weakness CT head was obtained which showed left-sided MCA he was transferred to [facility] for neuro ICU monitoring due to concern for malignant cerebral edema   Subjective  Today he has no complaints.  Ate lunch ok. Using IS and flutter valve  A & P   Acute left MCA stroke, left ICA occlusion.  Large left MCA stroke with CT head, repeat CT head on 8/13 showed stable MCA and improved midline shift .  Continues to have right-sided hemiplegia and receptive/expressive aphasia -Continue Eliquis, Plavix and Lipitor -Follow up with Dr. Myra Gianotti as outpatient in a few weeks arranged  Cerebral edema in setting of above.  Briefly required hypertonic normal saline during hospital course that led to some hypernatremia.  No longer on hypertonic saline, sodium levels within normal limits, repeat CT scan shows stable cerebral edema without worsening midline shift   Acute limb ischemia, status post angioplasty and stent placement.  Complicated by thrombectomy/endarterectomy with fasciotomy -Continue Eliquis  and Plavix  Concern for HIT, ruled out.  HIT panel negative. -Continue to monitor on Eliquis  Atrial fibrillation with RVR, new onset.  Currently rate controlled.  Briefly required Cardizem drip. -Normal rate on metoprolol 75 mg twice daily -TTE with preserved EF -Currently on Eliquis  Scrotal edema, stable -Continue to monitor -Scrotal sling  Elevated troponin, suspect demand ischemia troponin elevated 2000 2059 down to 2227, EF remains preserved with no wall motion abnormalities, and no ischemic changes on EKG.  Likely demand ischemia in the setting of A. fib and CVA  Dysphagia secondary to stroke.  No aspiration events in the hospital Has rhonchorous breath sounds(improved from 8/21) -Speech recommends dysphagia 1 diet with thin liquids  -Aspiration precautions-encourage incentive spirometer, flutter valve, continue scheduled Mucinex  HTN, stable -Continue metoprolol, lisinopril, amlodipine  HLD, stable -Continue Lipitor  Transaminitis, continue trending -Okay to continue Lipitor  Acute blood loss anemia in setting of operative intervention as mentioned above -Hemoglobin stable-transfuse if hemoglobin less than 7  Hypokalemia -Improved with oral replacement      Family Communication  : None  Code Status : Full  Disposition Plan  :  Patient is from home. Anticipated d/c date:  1-2 days. Barriers to d/c or necessity for inpatient status:  Medically stable for discharge, currently awaiting SNF bed availability Consults  : Vascular surgery, neurology  Procedures  :    DVT Prophylaxis  : Eliquis, Plavix  Lab Results  Component Value Date   PLT 361 03/04/2020    Diet :  Diet Order            DIET - DYS 1 Room service appropriate? No; Fluid consistency:  Thin  Diet effective now                  Inpatient Medications Scheduled Meds: . amLODipine  10 mg Oral Daily  . apixaban  5 mg Oral BID  . atorvastatin  80 mg Oral Daily  . chlorhexidine  15 mL Mouth  Rinse BID  . Chlorhexidine Gluconate Cloth  6 each Topical Daily  . clopidogrel  75 mg Oral Daily  . feeding supplement (ENSURE ENLIVE)  237 mL Oral BID BM  . folic acid  1 mg Oral Daily  . guaiFENesin  600 mg Oral BID  . lisinopril  40 mg Oral Daily  . mouth rinse  15 mL Mouth Rinse q12n4p  . metoprolol tartrate  75 mg Oral BID  . multivitamin with minerals  1 tablet Oral Daily  . nicotine  21 mg Transdermal Q2000  . pantoprazole  40 mg Oral Q2000  . potassium chloride  40 mEq Oral Daily   Continuous Infusions: . sodium chloride Stopped (02/25/20 1233)   PRN Meds:.sodium chloride, acetaminophen, albuterol, docusate sodium, ipratropium-albuterol, labetalol, ondansetron (ZOFRAN) IV, polyethylene glycol  Antibiotics  :   Anti-infectives (From admission, onward)   None       Objective   Vitals:   03/07/20 2338 03/08/20 0300 03/08/20 0804 03/08/20 1123  BP: 116/62 135/70 (!) 141/66 130/64  Pulse: 87 60 67 63  Resp: 18 18 18 18   Temp: 97.8 F (36.6 C) 97.9 F (36.6 C) 97.8 F (36.6 C) 98.3 F (36.8 C)  TempSrc: Oral Oral Oral Oral  SpO2: 98% 95% 99% 96%  Weight:  84.8 kg      SpO2: 96 % O2 Flow Rate (L/min): 2 L/min FiO2 (%): 21 %  Wt Readings from Last 3 Encounters:  03/08/20 84.8 kg  02/21/20 79.6 kg  02/21/20 79.4 kg     Intake/Output Summary (Last 24 hours) at 03/08/2020 1353 Last data filed at 03/08/2020 0300 Gross per 24 hour  Intake --  Output 925 ml  Net -925 ml    Physical Exam:     Awake Alert, Oriented X 3, Normal affect Right sided hemiplegia, expressive/receptive aphasia present, follows commands with the left side Scrotal swelling present without tenderness [place].AT, Normal respiratory effort on room air, rhonchourous breath sounds RRR,No Gallops,Rubs or new Murmurs,  +ve B.Sounds, Abd Soft, No tenderness, No rebound, guarding or rigidity. Staples in place on the left lower anterior leg    I have personally reviewed the following:    Data Reviewed:  CBC Recent Labs  Lab 03/03/20 0153 03/04/20 0137  WBC 11.7* 12.2*  HGB 10.5* 10.4*  HCT 32.4* 32.5*  PLT 330 361  MCV 94.2 94.2  MCH 30.5 30.1  MCHC 32.4 32.0  RDW 12.5 12.7  LYMPHSABS 1.2 1.4  MONOABS 1.0 1.1*  EOSABS 0.1 0.1  BASOSABS 0.0 0.0    Chemistries  Recent Labs  Lab 03/03/20 0153 03/04/20 0137  NA 136 137  K 3.2* 3.5  CL 103 104  CO2 25 25  GLUCOSE 135* 138*  BUN 10 17  CREATININE 0.64 0.65  CALCIUM 8.4* 8.5*   ------------------------------------------------------------------------------------------------------------------ No results for input(s): CHOL, HDL, LDLCALC, TRIG, CHOLHDL, LDLDIRECT in the last 72 hours.  Lab Results  Component Value Date   HGBA1C 6.2 (H) 02/23/2020   ------------------------------------------------------------------------------------------------------------------ No results for input(s): TSH, T4TOTAL, T3FREE, THYROIDAB in the last 72 hours.  Invalid input(s): FREET3 ------------------------------------------------------------------------------------------------------------------ No results for input(s): VITAMINB12, FOLATE, FERRITIN, TIBC, IRON, RETICCTPCT in  the last 72 hours.  Coagulation profile No results for input(s): INR, PROTIME in the last 168 hours.  No results for input(s): DDIMER in the last 72 hours.  Cardiac Enzymes No results for input(s): CKMB, TROPONINI, MYOGLOBIN in the last 168 hours.  Invalid input(s): CK ------------------------------------------------------------------------------------------------------------------ No results found for: BNP  Micro Results No results found for this or any previous visit (from the past 240 hour(s)).  Radiology Reports CT HEAD WO CONTRAST  Result Date: 02/28/2020 CLINICAL DATA:  Stroke, follow-up. EXAM: CT HEAD WITHOUT CONTRAST TECHNIQUE: Contiguous axial images were obtained from the base of the skull through the vertex without intravenous  contrast. COMPARISON:  CT head without contrast 02/26/2020, 02/24/2020, and 02/23/2019. FINDINGS: Brain: Large left MCA territory nonhemorrhagic infarct is again noted. Infarct involves the left lentiform nucleus and caudate head. Mass effect is again noted. Midline shift is stable to slightly improved at 4.5 mm. Partial effacement of left lateral ventricle is again noted. Sulcal effacement is stable. No significant extra-axial hemorrhage is present. No significant right-sided infarcts are present. Cerebellar atrophy is stable. No acute infarcts are present in the posterior fossa. Vascular: Atherosclerotic changes are present within the cavernous internal carotid arteries bilaterally. No hyperdense vessel is present. Skull: Calvarium is intact. No focal lytic or blastic lesions are present. No significant extracranial soft tissue lesion is present. Sinuses/Orbits: The paranasal sinuses and mastoid air cells are clear. Remote right orbital blowout fracture is stable. Globes and orbits are otherwise within normal limits. IMPRESSION: 1. Stable appearance of large left MCA territory nonhemorrhagic infarct. 2. Stable to slightly improved midline shift. 3. No significant extra-axial hemorrhage. 4. Stable atrophy and white matter disease. Electronically Signed   By: Marin Roberts M.D.   On: 02/28/2020 06:33   CT HEAD WO CONTRAST  Result Date: 02/26/2020 CLINICAL DATA:  Follow-up examination for acute stroke. EXAM: CT HEAD WITHOUT CONTRAST TECHNIQUE: Contiguous axial images were obtained from the base of the skull through the vertex without intravenous contrast. COMPARISON:  Prior CT from 02/24/2020. FINDINGS: Brain: Continued interval evolution of large left MCA territory infarct is seen, overall stable in size and distribution from previous. Associated regional mass effect with partial effacement of the left lateral ventricle and up to 5 mm of left-to-right shift, slightly worsened from previous. No  hydrocephalus or ventricular trapping. Basilar cisterns remain patent. No evidence for hemorrhagic transformation. No other new acute large vessel territory infarct. No intracranial hemorrhage. No extra-axial fluid collection. Vascular: No hyperdense vessel. Scattered vascular calcifications noted within the carotid siphons. Skull: Scalp soft tissues within normal limits. Calvarium unchanged. Sinuses/Orbits: Globes and orbital soft tissues demonstrate no acute finding. Paranasal sinuses and mastoid air cells remain largely clear. Other: None. IMPRESSION: 1. Continued interval evolution of large left MCA territory infarct, overall stable in size and distribution from previous. Associated regional mass effect with up to 5 mm of left-to-right shift, slightly worsened from previous. No hydrocephalus or ventricular trapping. No evidence for hemorrhagic transformation. 2. No other new acute intracranial abnormality. Electronically Signed   By: Rise Mu M.D.   On: 02/26/2020 02:07   CT HEAD WO CONTRAST  Result Date: 02/24/2020 CLINICAL DATA:  Stroke follow-up EXAM: CT HEAD WITHOUT CONTRAST TECHNIQUE: Contiguous axial images were obtained from the base of the skull through the vertex without intravenous contrast. COMPARISON:  02/23/2020 head CT. FINDINGS: Brain: Sequela of evolving left MCA territory infarct. 3-4 mm rightward midline shift is unchanged. No intracranial hemorrhage. No mass lesion. No ventriculomegaly or extra-axial fluid  collection. Vascular: No hyperdense vessel. Bilateral skull base atherosclerotic calcifications. Skull: Negative for fracture or focal lesion. Sinuses/Orbits: Normal orbits. Soft tissue impacted left maxillary molar, unchanged. Mild right sphenoid sinus mucosal thickening. No mastoid effusion. Other: None. IMPRESSION: Evolving left MCA territory infarct with unchanged 3-4 mm rightward midline shift. No intracranial hemorrhage. Electronically Signed   By: Stana Bunting  M.D.   On: 02/24/2020 09:29   CT HEAD WO CONTRAST  Result Date: 02/23/2020 CLINICAL DATA:  Stroke follow-up EXAM: CT HEAD WITHOUT CONTRAST TECHNIQUE: Contiguous axial images were obtained from the base of the skull through the vertex without intravenous contrast. COMPARISON:  Head CT 02/21/2020 FINDINGS: Brain: Large area of cytotoxic edema throughout the left MCA territory. No acute hemorrhage. 3 mm rightward midline shift. Vascular: Atherosclerotic calcification of the internal carotid arteries at the skull base. Skull: Normal. Negative for fracture or focal lesion. Sinuses/Orbits: No acute finding. Other: None. IMPRESSION: 1. Slightly increased cytotoxic edema within the left MCA territory. 2. No acute hemorrhage. 3. 3 mm rightward midline shift. Electronically Signed   By: Deatra Robinson M.D.   On: 02/23/2020 22:00   US Carotid Bilateral  Result Date: 02/23/2020 CLINICAL DATA:  Slurred speech.  Carotid stenosis. EXAM: BILATERAL CAROTID DUPLEX ULTRASOUND TECHNIQUE: Wallace Cullens scale imaging, color Doppler and duplex ultrasound were performed of bilateral carotid and vertebral arteries in the neck. COMPARISON:  CTA neck 02/23/2020 FINDINGS: Criteria: Quantification of carotid stenosis is based on velocity parameters that correlate the residual internal carotid diameter with NASCET-based stenosis levels, using the diameter of the distal internal carotid lumen as the denominator for stenosis measurement. The following velocity measurements were obtained: RIGHT ICA: 117/63 cm/sec CCA: 130/38 cm/sec SYSTOLIC ICA/CCA RATIO:  0.9 ECA: 220 cm/sec LEFT ICA: Occluded CCA: 75/10 cm/sec ECA: 195 cm/sec RIGHT CAROTID ARTERY: Mild atherosclerotic disease in the right common carotid artery. Heterogeneous plaque at the right carotid bulb. External carotid artery is patent with normal waveform. Atherosclerotic plaque in the proximal internal carotid artery. Normal waveforms and velocities in the internal carotid artery. RIGHT  VERTEBRAL ARTERY: Antegrade flow and normal waveform in the right vertebral artery. LEFT CAROTID ARTERY: Left common carotid artery is patent with intimal thickening. Left carotid artery is patent. External carotid artery is patent with normal waveform. 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 No flow in the mid or distal left internal carotid artery. LEFT VERTEBRAL ARTERY: Antegrade flow and normal waveform in the left vertebral artery. IMPRESSION: 1. 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 Findings correspond with recent CTA findings. 2. Atherosclerotic plaque involving the carotid arteries. Estimated degree of stenosis in the right internal carotid artery is less than 50%. 3. Patent vertebral arteries with antegrade flow. Electronically Signed   By: Richarda Overlie M.D.   On: 02/23/2020 14:42   PERIPHERAL VASCULAR CATHETERIZATION  Result Date: 02/21/2020 See op note  CT CEREBRAL PERFUSION W CONTRAST  Result Date: 02/23/2020 CLINICAL DATA:  Neuro deficit, acute, stroke suspected. EXAM: CT ANGIOGRAPHY HEAD AND NECK CT PERFUSION BRAIN TECHNIQUE: Multidetector CT imaging of the head and neck was performed using the standard protocol during bolus administration of intravenous contrast. Multiplanar CT image reconstructions and MIPs were obtained to evaluate the vascular anatomy. Carotid stenosis measurements (when applicable) are obtained utilizing NASCET criteria, using the distal internal carotid diameter as the denominator. Multiphase CT imaging of the brain was performed following IV bolus contrast injection. Subsequent parametric perfusion maps were calculated using RAPID software. CONTRAST:  OMNIPAQUE  IOHEXOL 350 MG/ML SOLN COMPARISON:  Noncontrast head CT performed earlier the same day. FINDINGS: CTA NECK FINDINGS Aortic arch: The left vertebral artery arises directly from the aortic arch. Atherosclerotic plaque within the visualized aortic arch and proximal major branch  vessels of the neck. No hemodynamically significant innominate or proximal subclavian artery stenosis. Right carotid system: The CCA and ICA are patent within the neck. Atherosclerotic plaque within these vessels. Most notably there is at least moderate calcified plaque within the carotid bifurcation. Exact quantification of stenosis within the proximal right ICA is difficult due to motion artifact and blooming artifact of calcified plaque at this site. Additionally, there is 50-60% stenosis of the mid cervical ICA. Left carotid system: The CCA is patent to the bifurcation. 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 Vertebral arteries: The dominant right vertebral artery is patent throughout the neck. Moderate/severe atherosclerotic narrowing within the V1 right vertebral artery. The non dominant left vertebral artery is developmentally diminutive, but patent throughout the neck Skeleton: Reversal of the expected cervical lordosis. Cervical spondylosis with multilevel disc space narrowing, posterior disc osteophytes and uncovertebral hypertrophy. Other neck: No neck mass or cervical lymphadenopathy. Upper chest: No consolidation within the imaged lung apices. 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 There is minimal reconstitution of flow within the supraclinoid left ICA. The M1 and M2 left middle cerebral arteries are patent, although with asymmetrically decreased opacification as compared to the right side. Additionally, there is a moderate to moderately severe focal stenosis within a superior division proximal M2 left MCA branch (series 8, image 17). The intracranial right internal carotid artery is patent. However, there are sites of severe stenosis within  the cavernous and paraclinoid segments. The M1 right middle cerebral artery is patent without significant stenosis. No right M2 proximal branch occlusion is identified. There is a moderate to moderately severe stenosis within an inferior division mid M2 right MCA branch vessel (series 9, image 19). The A1 left anterior cerebral artery is hypoplastic. The A1 right anterior cerebral artery is dominant. The A2 and more distal anterior cerebral arteries are patent. However, there are multifocal high-grade stenoses within the bilateral A2 anterior cerebral arteries. Posterior circulation: The non dominant intracranial left vertebral artery is developmentally diminutive, but patent and terminates as the left PICA. The dominant intracranial right vertebral artery is patent, although with sites of moderate stenosis. The basilar artery is patent. The posterior cerebral arteries are patent. However, there is a high-grade focal stenosis within the P2 left PCA and high-grade focal stenosis within the right PCA at the P2/P3 junction. Venous sinuses: Within limitations of contrast timing, no convincing thrombus. Anatomic variants: Posterior communicating arteries are hypoplastic or absent bilaterally. Review of the MIP images confirms the above findings CT Brain Perfusion Findings: ASPECTS: 4 CBF (<30%) Volume: 22mL (left MCA vascular territory) Perfusion (Tmax>6.0s) volume: (left MCA vascular territory) Mismatch Volume: 42mL Infarction Location:Left MCA vascular territory. These results were called by telephone at the time of interpretation on 02/23/2020 at 12:40 pm to provider [facility] , who verbally acknowledged these results. IMPRESSION: CTA neck: 1. 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 2. There is at least moderate calcified plaque at the right carotid  bifurcation. There is stenosis at the origin of the cervical right vertebral artery which  is poorly quantified due to motion degradation and blooming artifact from calcified plaque at this site. Carotid artery duplex is recommended for further evaluation. Additionally, there is apparent 50-60% stenosis of the mid cervical right ICA. 3. Moderate/severe atherosclerotic narrowing within the Dom V1 right vertebral artery. 4. The left vertebral artery is developmentally diminutive, but patent throughout the neck. CTA head: 1. The left internal carotid artery remains occluded intracranially throughout the siphon region. There is minimal reconstitution of flow within the supraclinoid left ICA. The M1 and M2 left middle cerebral arteries are patent, although with asymmetrically decreased opacification as compared to the right side. Additionally, there is a moderate to moderately severe focal stenosis within a superior division proximal M2 left MCA branch. 2. Additional intracranial atherosclerotic disease with multifocal stenoses, most notably as follows. 3. High-grade stenoses within the cavernous/paraclinoid right ICA. 4. Moderate to moderately severe focal stenosis within an inferior division mid M2 right MCA branch vessel. 5. Moderate stenosis within the V4 right vertebral artery. 6. High-grade stenosis within the P2 left PCA. 7. High-grade stenosis within the right PCA at the P2/P3 junction. 8. Multifocal high-grade stenoses within the A2 anterior cerebral arteries bilaterally. CT perfusion head: The perfusion software identifies an 89 mL core infarct within the left MCA vascular territory. The perfusion software identifies a 149 mL region of critically hypoperfused parenchyma within the left MCA vascular territory. Reported mismatch volume 60 mL. Electronically Signed   By: Jackey Loge DO   On: 02/23/2020 12:53   DG CHEST PORT 1 VIEW  Result Date: 02/24/2020 CLINICAL DATA:  59 year old male with shortness of breath. EXAM: PORTABLE CHEST 1 VIEW COMPARISON:  Chest radiograph dated 02/22/2020. FINDINGS:  Status post removal of the endotracheal and enteric tube. There is diffuse interstitial densities which may be chronic or represent edema or atypical pneumonia. No large pleural effusion. No pneumothorax. Stable cardiac silhouette. No acute osseous pathology. IMPRESSION: Interval removal of the endotracheal and enteric tubes. Electronically Signed   By: Elgie Collard M.D.   On: 02/24/2020 19:11   Portable Chest x-ray  Result Date: 02/22/2020 CLINICAL DATA:  Left cerebral artery and polyp stroke, postop bilateral iliofemoral thrombectomy, ETT EXAM: PORTABLE CHEST 1 VIEW COMPARISON:  01/12/2013 FINDINGS: Lungs are essentially clear.  No pleural effusion or pneumothorax. The heart is normal in size. Endotracheal tube terminates 5 cm above the carina. Enteric tube terminates in the proximal gastric body. IMPRESSION: Endotracheal tube terminates 5 cm above the carina. Enteric tube terminates in the proximal gastric body. Electronically Signed   By: Charline Bills M.D.   On: 02/22/2020 08:54   DG Swallowing Func-Speech Pathology  Result Date: 02/28/2020 Objective Swallowing Evaluation: Type of Study: MBS-Modified Barium Swallow Study  Patient Details Name: Jonathan [name] MRN: [id number] Date of Birth: November 13, 1960 Today's Date: 02/28/2020 Time: SLP Start Time (ACUTE ONLY): 1457 -SLP Stop Time (ACUTE ONLY): 1513 SLP Time Calculation (min) (ACUTE ONLY): 16 min Past Medical History: Past Medical History: Diagnosis Date . Aphasia S/P CVA  . DDD (degenerative disc disease), cervical  . ED (erectile dysfunction)  . Hyperlipidemia  . Hypertension  . Peripheral arterial disease (HCC)  . Stroke [facility])  Past Surgical History: Past Surgical History: Procedure Laterality Date . FEMORAL-FEMORAL BYPASS GRAFT Bilateral 02/22/2020  Procedure: bilateral ileofemoral thrombectomy, left popliteal exposure and thrombectomy, four compartment fasciotomy, left femoral endarterectomy with patch angioplasty;  Surgeon: Nada Libman,  MD;  Location: [facility] ORS;  Service: Vascular;  Laterality: Bilateral; . LOWER EXTREMITY ANGIOGRAPHY Left 02/21/2020  Procedure: LOWER EXTREMITY ANGIOGRAPHY;  Surgeon: Renford DillsSchnier, Gregory G, MD;  Location: [facility] INVASIVE CV LAB;  Service: Cardiovascular;  Laterality: Left; . PERIPHERAL VASCULAR CATHETERIZATION N/A 07/21/2015  Procedure: Abdominal Aortogram w/Lower Extremity;  Surgeon: Renford DillsGregory G [name], MD;  Location: [facility] INVASIVE CV LAB;  Service: Cardiovascular;  Laterality: N/A; . PERIPHERAL VASCULAR CATHETERIZATION  07/21/2015  Procedure: Lower Extremity Intervention;  Surgeon: Renford DillsGregory G [name], MD;  Location: [facility] INVASIVE CV LAB;  Service: Cardiovascular;; HPI: 59 y.o. male with a history of hypertension, hyperlipidemia, CVA, severe PAD  who presented with acute critical limb ischemia secondary to occlusion of the iliac artery who [facility].  He was taken back to the OR on 8/7 found to have acute thrombosis of bilateral iliac stents underwent thrombectomy and endarterectomy;  fasciotomies of the left leg. Extubated 8/8. Developed right hemiplegia and aphasia morning of 8/8. CTH: large territory infarct left MCA. [facility].   Subjective: alert, aphasic Assessment / Plan / Recommendation CHL IP CLINICAL IMPRESSIONS 02/28/2020 Clinical Impression Pt's oropharyngeal swallow is stable compared to MBS completed on 8/9 even with pt self-feeding throughout this study (see that note for more specifics regarding physiology). Pt was allowed to self-feed at his own rate, drinking cups of thin barium without stopping. Across multiple small cupfuls, there was no aspiration. Trace penetration occurred x2 with thin liquids but cleared spontaneously as he continued to swallow additional boluses. Would continue Dys 1 (puree) diet and thin liquids with full supervision for safety.  SLP Visit Diagnosis Dysphagia, oropharyngeal phase (R13.12) Attention and concentration deficit following --  Frontal lobe and executive function deficit following -- Impact on safety and function Mild aspiration risk   CHL IP TREATMENT RECOMMENDATION 02/28/2020 Treatment Recommendations Therapy as outlined in treatment plan below   Prognosis 02/28/2020 Prognosis for Safe Diet Advancement Good Barriers to Reach Goals Language deficits Barriers/Prognosis Comment -- CHL IP DIET RECOMMENDATION 02/28/2020 SLP Diet Recommendations Dysphagia 1 (Puree) solids;Thin liquid Liquid Administration via Cup;Straw Medication Administration Whole meds with puree Compensations Minimize environmental distractions;Slow rate;Small sips/bites Postural Changes Seated upright at 90 degrees;Remain semi-upright after after feeds/meals (Comment)   CHL IP OTHER RECOMMENDATIONS 02/28/2020 Recommended Consults -- Oral Care Recommendations Oral care BID Other Recommendations Have oral suction available   CHL IP FOLLOW UP RECOMMENDATIONS 02/28/2020 Follow up Recommendations Inpatient Rehab   CHL IP FREQUENCY AND DURATION 02/28/2020 Speech Therapy Frequency (ACUTE ONLY) min 2x/week Treatment Duration 2 weeks      CHL IP ORAL PHASE 02/28/2020 Oral Phase Impaired Oral - Pudding Teaspoon -- Oral - Pudding Cup -- Oral - Honey Teaspoon -- Oral - Honey Cup -- Oral - Nectar Teaspoon -- Oral - Nectar Cup -- Oral - Nectar Straw NT Oral - Thin Teaspoon -- Oral - Thin Cup Right anterior bolus loss;Weak lingual manipulation;Reduced posterior propulsion;Holding of bolus;Right pocketing in lateral sulci;Pocketing in anterior sulcus;Lingual/palatal residue;Piecemeal swallowing;Decreased bolus cohesion Oral - Thin Straw Right anterior bolus loss;Weak lingual manipulation;Reduced posterior propulsion;Holding of bolus;Right pocketing in lateral sulci;Pocketing in anterior sulcus;Lingual/palatal residue;Piecemeal swallowing;Decreased bolus cohesion Oral - Puree Right anterior bolus loss;Weak lingual manipulation;Reduced posterior propulsion;Holding of bolus;Right pocketing in  lateral sulci;Pocketing in anterior sulcus;Lingual/palatal residue;Piecemeal swallowing;Decreased bolus cohesion Oral - Mech Soft -- Oral - Regular NT Oral - Multi-Consistency -- Oral - Pill -- Oral Phase - Comment --  CHL IP PHARYNGEAL PHASE 02/28/2020 Pharyngeal Phase Impaired Pharyngeal- Pudding  Teaspoon -- Pharyngeal -- Pharyngeal- Pudding Cup -- Pharyngeal -- Pharyngeal- Honey Teaspoon -- Pharyngeal -- Pharyngeal- Honey Cup -- Pharyngeal -- Pharyngeal- Nectar Teaspoon -- Pharyngeal -- Pharyngeal- Nectar Cup -- Pharyngeal -- Pharyngeal- Nectar Straw NT Pharyngeal -- Pharyngeal- Thin Teaspoon -- Pharyngeal -- Pharyngeal- Thin Cup Delayed swallow initiation-pyriform sinuses;Delayed swallow initiation-vallecula Pharyngeal -- Pharyngeal- Thin Straw Delayed swallow initiation-pyriform sinuses;Delayed swallow initiation-vallecula Pharyngeal -- Pharyngeal- Puree Delayed swallow initiation-pyriform sinuses;Delayed swallow initiation-vallecula Pharyngeal -- Pharyngeal- Mechanical Soft -- Pharyngeal -- Pharyngeal- Regular NT Pharyngeal -- Pharyngeal- Multi-consistency -- Pharyngeal -- Pharyngeal- Pill -- Pharyngeal -- Pharyngeal Comment --  CHL IP CERVICAL ESOPHAGEAL PHASE 02/28/2020 Cervical Esophageal Phase WFL Pudding Teaspoon -- Pudding Cup -- Honey Teaspoon -- Honey Cup -- Nectar Teaspoon -- Nectar Cup -- Nectar Straw -- Thin Teaspoon -- Thin Cup -- Thin Straw -- Puree -- Mechanical Soft -- Regular -- Multi-consistency -- Pill -- Cervical Esophageal Comment -- Mahala Menghini., M.A. CCC-SLP Acute Rehabilitation Services Pager [phone] Office [phone] 02/28/2020, 4:22 PM              DG Swallowing Func-Speech Pathology  Result Date: 02/24/2020 Objective Swallowing Evaluation: Type of Study: MBS-Modified Barium Swallow Study  Patient Details Name: Jonathan [name] MRN: [id number] Date of Birth: 08/29/1960 Today's Date: 02/24/2020 Time: SLP Start Time (ACUTE ONLY): 1310 -SLP Stop Time (ACUTE ONLY): 1340 SLP Time  Calculation (min) (ACUTE ONLY): 30 min Past Medical History: Past Medical History: Diagnosis Date . Aphasia S/P CVA  . DDD (degenerative disc disease), cervical  . ED (erectile dysfunction)  . Hyperlipidemia  . Hypertension  . Peripheral arterial disease (HCC)  . Stroke [facility])  Past Surgical History: Past Surgical History: Procedure Laterality Date . FEMORAL-FEMORAL BYPASS GRAFT Bilateral 02/22/2020  Procedure: bilateral ileofemoral thrombectomy, left popliteal exposure and thrombectomy, four compartment fasciotomy, left femoral endarterectomy with patch angioplasty;  Surgeon: Nada Libman, MD;  Location: [facility] ORS;  Service: Vascular;  Laterality: Bilateral; . LOWER EXTREMITY ANGIOGRAPHY Left 02/21/2020  Procedure: LOWER EXTREMITY ANGIOGRAPHY;  Surgeon: Renford Dills, MD;  Location: [facility] INVASIVE CV LAB;  Service: Cardiovascular;  Laterality: Left; . PERIPHERAL VASCULAR CATHETERIZATION N/A 07/21/2015  Procedure: Abdominal Aortogram w/Lower Extremity;  Surgeon: Renford Dills, MD;  Location: [facility] INVASIVE CV LAB;  Service: Cardiovascular;  Laterality: N/A; . PERIPHERAL VASCULAR CATHETERIZATION  07/21/2015  Procedure: Lower Extremity Intervention;  Surgeon: Renford Dills, MD;  Location: [facility] INVASIVE CV LAB;  Service: Cardiovascular;; HPI: 59 y.o. male with a history of hypertension, hyperlipidemia, CVA, severe PAD  who presented with acute critical limb ischemia secondary to occlusion of the iliac artery who underwent stenting on 8/6 at [facility].  He was taken back to the OR on 8/7 found to have acute thrombosis of bilateral iliac stents underwent thrombectomy and endarterectomy;  fasciotomies of the left leg. Extubated 8/8. Developed right hemiplegia and aphasia morning of 8/8. CTH: large territory infarct left MCA. Pt transferred to [facility] same day.   Subjective: alert, aphasic Assessment / Plan / Recommendation CHL IP CLINICAL IMPRESSIONS 02/24/2020 Clinical Impression Pt presents with a primary oral dysphagia c/b  impaired bolus cohesion and control over release into the pharynx.  There was decreased sensation on the right, with barium residue in right lateral and anterior sulci and limited awareness of its presence.  Pharyngeal function was largely intact, with adequate pharyngeal squeeze, no residue post-swallow, and effective laryngeal vestibule closure.  Occasionally, thin and nectar thick barium reached the pyriforms for a delayed period of time before the swallow was  triggered.  On other occasions, the swallow trigger was timely.  There was one incident of penetration to the vocal folds when a bolus of nectar thick liquid prematurely escaped the oral cavity and entered the laryngeal vestibule without eliciting a response.  Pt's impaired sensation appears to be the primary concern and puts him at greater risk of aspiration.  For now, recommend starting a dysphagia 1 diet, thin liquids from cup only - NO STRAWS; give meds whole in applesauce/pudding.  Due to pt's impulsivity, difficulty following commands, and poor sensation/awareness on right side of mouth/throat, he will require careful supervision during meals.  SLP will follow for safety/diet progression and therapeutic training.   SLP Visit Diagnosis Dysphagia, oropharyngeal phase (R13.12) Attention and concentration deficit following -- Frontal lobe and executive function deficit following -- Impact on safety and function Mild aspiration risk   CHL IP TREATMENT RECOMMENDATION 02/24/2020 Treatment Recommendations Therapy as outlined in treatment plan below   Prognosis 02/24/2020 Prognosis for Safe Diet Advancement Good Barriers to Reach Goals -- Barriers/Prognosis Comment -- CHL IP DIET RECOMMENDATION 02/24/2020 SLP Diet Recommendations Dysphagia 1 (Puree) solids;Thin liquid Liquid Administration via Cup;No straw Medication Administration Whole meds with puree Compensations Minimize environmental distractions;Slow rate;Small sips/bites Postural Changes --   CHL IP OTHER  RECOMMENDATIONS 02/24/2020 Recommended Consults -- Oral Care Recommendations Oral care BID Other Recommendations --   CHL IP FOLLOW UP RECOMMENDATIONS 02/24/2020 Follow up Recommendations Inpatient Rehab   CHL IP FREQUENCY AND DURATION 02/24/2020 Speech Therapy Frequency (ACUTE ONLY) min 3x week Treatment Duration 2 weeks      CHL IP ORAL PHASE 02/24/2020 Oral Phase Impaired Oral - Pudding Teaspoon -- Oral - Pudding Cup -- Oral - Honey Teaspoon -- Oral - Honey Cup -- Oral - Nectar Teaspoon -- Oral - Nectar Cup -- Oral - Nectar Straw Right anterior bolus loss;Weak lingual manipulation;Reduced posterior propulsion;Holding of bolus;Right pocketing in lateral sulci;Pocketing in anterior sulcus;Lingual/palatal residue;Piecemeal swallowing;Decreased bolus cohesion Oral - Thin Teaspoon -- Oral - Thin Cup Right anterior bolus loss;Weak lingual manipulation;Reduced posterior propulsion;Holding of bolus;Right pocketing in lateral sulci;Pocketing in anterior sulcus;Lingual/palatal residue;Piecemeal swallowing;Decreased bolus cohesion Oral - Thin Straw Right anterior bolus loss;Weak lingual manipulation;Reduced posterior propulsion;Holding of bolus;Right pocketing in lateral sulci;Pocketing in anterior sulcus;Lingual/palatal residue;Piecemeal swallowing;Decreased bolus cohesion Oral - Puree Right anterior bolus loss;Weak lingual manipulation;Reduced posterior propulsion;Holding of bolus;Right pocketing in lateral sulci;Pocketing in anterior sulcus;Lingual/palatal residue;Piecemeal swallowing;Decreased bolus cohesion Oral - Mech Soft -- Oral - Regular Right anterior bolus loss;Weak lingual manipulation;Reduced posterior propulsion;Holding of bolus;Right pocketing in lateral sulci;Pocketing in anterior sulcus;Lingual/palatal residue;Piecemeal swallowing;Decreased bolus cohesion Oral - Multi-Consistency -- Oral - Pill -- Oral Phase - Comment --  CHL IP PHARYNGEAL PHASE 02/24/2020 Pharyngeal Phase Impaired Pharyngeal- Pudding Teaspoon --  Pharyngeal -- Pharyngeal- Pudding Cup -- Pharyngeal -- Pharyngeal- Honey Teaspoon -- Pharyngeal -- Pharyngeal- Honey Cup -- Pharyngeal -- Pharyngeal- Nectar Teaspoon -- Pharyngeal -- Pharyngeal- Nectar Cup -- Pharyngeal -- Pharyngeal- Nectar Straw Delayed swallow initiation-pyriform sinuses;Delayed swallow initiation-vallecula;Penetration/Aspiration before swallow Pharyngeal Material enters airway, CONTACTS cords and not ejected out Pharyngeal- Thin Teaspoon -- Pharyngeal -- Pharyngeal- Thin Cup Delayed swallow initiation-pyriform sinuses;Delayed swallow initiation-vallecula Pharyngeal -- Pharyngeal- Thin Straw Delayed swallow initiation-vallecula;Delayed swallow initiation-pyriform sinuses Pharyngeal -- Pharyngeal- Puree Delayed swallow initiation-vallecula;Delayed swallow initiation-pyriform sinuses Pharyngeal -- Pharyngeal- Mechanical Soft -- Pharyngeal -- Pharyngeal- Regular Delayed swallow initiation-vallecula Pharyngeal -- Pharyngeal- Multi-consistency -- Pharyngeal -- Pharyngeal- Pill -- Pharyngeal -- Pharyngeal Comment --  CHL IP CERVICAL ESOPHAGEAL PHASE 02/24/2020 Cervical Esophageal Phase WFL Pudding Teaspoon --  Pudding Cup -- Honey Teaspoon -- Honey Cup -- Nectar Teaspoon -- Nectar Cup -- Nectar Straw -- Thin Teaspoon -- Thin Cup -- Thin Straw -- Puree -- Mechanical Soft -- Regular -- Multi-consistency -- Pill -- Cervical Esophageal Comment -- Blenda Mounts Laurice 02/24/2020, 2:58 PM              ECHOCARDIOGRAM COMPLETE  Result Date: 02/24/2020    ECHOCARDIOGRAM REPORT   Patient Name:   Jonathan [name] Date of Exam: 02/24/2020 Medical Rec #:  [id number]          Height:       67.0 in Accession #:    [id number]         Weight:       175.5 lb Date of Birth:  1960/11/25          BSA:          1.913 m Patient Age:    59 years           BP:           159/96 mmHg Patient Gender: M                  HR:           106 bpm. Exam Location:  Inpatient Procedure: 2D Echo and Intracardiac Opacification Agent  Indications:    Stroke 434.91 / I163.9  History:        Patient has no prior history of Echocardiogram examinations.                 Peripheral arterial disease, underwent stenting, complicated by                 thrombosis requiring thrombectomy and LLE fasciotomy. Developed                 left facial droop. 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  Sonographer:    Leta Jungling RDCS Referring Phys: [id number] JINDONG XU IMPRESSIONS  1. Left ventricular ejection fraction, by estimation, is 60 to 65%. Left ventricular ejection fraction by 2D MOD biplane is 63.2 %. The left ventricle has normal function. The left ventricle has no regional wall motion abnormalities. Left ventricular diastolic parameters are indeterminate.  2. Right ventricular systolic function is normal. The right ventricular size is normal. There is normal pulmonary artery systolic pressure. The estimated right ventricular systolic pressure is 30.9 mmHg.  3. The mitral valve is grossly normal. No evidence of mitral valve regurgitation. No evidence of mitral stenosis.  4. The aortic valve is tricuspid. Aortic valve regurgitation is not visualized. No aortic stenosis is present.  5. The inferior vena cava is normal in size with greater than 50% respiratory variability, suggesting right atrial pressure of 3 mmHg. Conclusion(s)/Recommendation(s): No intracardiac source of embolism detected on this transthoracic study. A transesophageal echocardiogram is recommended to exclude cardiac source of embolism if clinically indicated. FINDINGS  Left Ventricle: Left ventricular ejection fraction, by estimation, is 60 to 65%. Left ventricular ejection fraction by 2D MOD biplane is 63.2 %. The left ventricle has normal function. The left ventricle has no regional wall motion abnormalities. Definity contrast agent was given IV to delineate the left ventricular endocardial borders. The left ventricular internal cavity size was normal in size. There is no left ventricular  hypertrophy. Left ventricular diastolic parameters are indeterminate. Right Ventricle: The right ventricular size is normal. No increase in right ventricular wall thickness. Right ventricular systolic function is normal. There is normal pulmonary  artery systolic pressure. The tricuspid regurgitant velocity is 2.64 m/s, and  with an assumed right atrial pressure of 3 mmHg, the estimated right ventricular systolic pressure is 30.9 mmHg. Left Atrium: Left atrial size was normal in size. Right Atrium: Right atrial size was normal in size. Pericardium: Trivial pericardial effusion is present. Presence of pericardial fat pad. Mitral Valve: The mitral valve is grossly normal. No evidence of mitral valve regurgitation. No evidence of mitral valve stenosis. Tricuspid Valve: The tricuspid valve is grossly normal. Tricuspid valve regurgitation is trivial. No evidence of tricuspid stenosis. Aortic Valve: The aortic valve is tricuspid. Aortic valve regurgitation is not visualized. No aortic stenosis is present. Pulmonic Valve: The pulmonic valve was grossly normal. Pulmonic valve regurgitation is not visualized. No evidence of pulmonic stenosis. Aorta: The aortic root is normal in size and structure. Venous: The inferior vena cava is normal in size with greater than 50% respiratory variability, suggesting right atrial pressure of 3 mmHg. IAS/Shunts: The atrial septum is grossly normal.  LEFT VENTRICLE PLAX 2D                        Biplane EF (MOD) LVIDd:         4.90 cm         LV Biplane EF:   Left LVIDs:         3.60 cm                          ventricular LV PW:         0.90 cm                          ejection LV IVS:        1.10 cm                          fraction by LVOT diam:     1.90 cm                          2D MOD LV SV:         43                               biplane is LV SV Index:   22                               63.2 %. LVOT Area:     2.84 cm                                Diastology                                 LV e' lateral:   8.93 cm/s LV Volumes (MOD)               LV E/e' lateral: 11.4 LV vol d, MOD    98.1 ml       LV e' medial:    6.08 cm/s A2C:  LV E/e' medial:  16.8 LV vol d, MOD    88.1 ml A4C: LV vol s, MOD    53.3 ml A2C: LV vol s, MOD    23.4 ml A4C: LV SV MOD A2C:   44.8 ml LV SV MOD A4C:   88.1 ml LV SV MOD BP:    60.6 ml RIGHT VENTRICLE RV S prime:     11.40 cm/s TAPSE (M-mode): 1.9 cm LEFT ATRIUM             Index       RIGHT ATRIUM           Index LA diam:        3.90 cm 2.04 cm/m  RA Area:     13.40 cm LA Vol (A2C):   50.2 ml 26.24 ml/m RA Volume:   34.30 ml  17.93 ml/m LA Vol (A4C):   63.9 ml 33.40 ml/m LA Biplane Vol: 58.4 ml 30.53 ml/m  AORTIC VALVE LVOT Vmax:   88.30 cm/s LVOT Vmean:  53.100 cm/s LVOT VTI:    0.151 m  AORTA Ao Root diam: 3.30 cm MITRAL VALVE                TRICUSPID VALVE MV Area (PHT): 7.16 cm     TR Peak grad:   27.9 mmHg MV Decel Time: 106 msec     TR Vmax:        264.00 cm/s MV E velocity: 102.00 cm/s MV A velocity: 49.20 cm/s   SHUNTS MV E/A ratio:  2.07         Systemic VTI:  0.15 m                             Systemic Diam: 1.90 cm Lennie Odor MD Electronically signed by Lennie Odor MD Signature Date/Time: 02/24/2020/1:09:12 PM    Final    CT HEAD CODE STROKE WO CONTRAST  Result Date: 02/23/2020 CLINICAL DATA:  Code stroke. Neuro deficit, acute, stroke suspected. Mental status change at 11 a.m., last known well 8 a.m. this morning. EXAM: CT HEAD WITHOUT CONTRAST TECHNIQUE: Contiguous axial images were obtained from the base of the skull through the vertex without intravenous contrast. COMPARISON:  Brain MRI 09/17/2018 FINDINGS: Brain: There is a large acute demarcated ischemic infarct within the left MCA vascular territory affecting the left frontal, temporal and parietal lobes as well as left insula and subinsular region. The left caudate and lentiform nuclei are also affected. No evidence of hemorrhagic conversion. There is mild  associated mass effect with 3 mm rightward midline shift. Redemonstrated superimposed small chronic infarcts within the left cerebral hemisphere. Background mild generalized parenchymal atrophy and chronic small vessel ischemic disease. No extra-axial fluid collection. No evidence of intracranial mass. Vascular: No hyperdense vessel Skull: Atherosclerotic calcifications. Sinuses/Orbits: Visualized orbits show no acute finding. Mild ethmoid sinus mucosal thickening. No significant mastoid effusion. ASPECTS [facility] Stroke Program Early CT Score) - Ganglionic level infarction (caudate, lentiform nuclei, internal capsule, insula, M1-M3 cortex): 2 - Supraganglionic infarction (M4-M6 cortex): 2 Total score (0-10 with 10 being normal): 4 These results were called by telephone at the time of interpretation on 02/23/2020 at 12:20 pm to provider Erick Blinks, who verbally acknowledged these results. IMPRESSION: Large acute demarcated left MCA vascular territory as described. ASPECTS is 4. No evidence of hemorrhagic conversion. Mild mass effect with 3 mm rightward midline shift. Stable background mild generalized parenchymal atrophy and chronic small vessel  ischemic disease. Superimposed small chronic infarcts within the left cerebral hemisphere. Electronically Signed   By: Jackey Loge DO   On: 02/23/2020 12:22   CT ANGIO HEAD CODE STROKE  Result Date: 02/23/2020 CLINICAL DATA:  Neuro deficit, acute, stroke suspected. EXAM: CT ANGIOGRAPHY HEAD AND NECK CT PERFUSION BRAIN TECHNIQUE: Multidetector CT imaging of the head and neck was performed using the standard protocol during bolus administration of intravenous contrast. Multiplanar CT image reconstructions and MIPs were obtained to evaluate the vascular anatomy. Carotid stenosis measurements (when applicable) are obtained utilizing NASCET criteria, using the distal internal carotid diameter as the denominator. Multiphase CT imaging of the brain was performed following  IV bolus contrast injection. Subsequent parametric perfusion maps were calculated using RAPID software. CONTRAST:  OMNIPAQUE IOHEXOL 350 MG/ML SOLN COMPARISON:  Noncontrast head CT performed earlier the same day. FINDINGS: CTA NECK FINDINGS Aortic arch: The left vertebral artery arises directly from the aortic arch. Atherosclerotic plaque within the visualized aortic arch and proximal major branch vessels of the neck. No hemodynamically significant innominate or proximal subclavian artery stenosis. Right carotid system: The CCA and ICA are patent within the neck. Atherosclerotic plaque within these vessels. Most notably there is at least moderate calcified plaque within the carotid bifurcation. Exact quantification of stenosis within the proximal right ICA is difficult due to motion artifact and blooming artifact of calcified plaque at this site. Additionally, there is 50-60% stenosis of the mid cervical ICA. Left carotid system: The CCA is patent to the bifurcation. 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 Vertebral arteries: The dominant right vertebral artery is patent throughout the neck. Moderate/severe atherosclerotic narrowing within the V1 right vertebral artery. The non dominant left vertebral artery is developmentally diminutive, but patent throughout the neck Skeleton: Reversal of the expected cervical lordosis. Cervical spondylosis with multilevel disc space narrowing, posterior disc osteophytes and uncovertebral hypertrophy. Other neck: No neck mass or cervical lymphadenopathy. Upper chest: No consolidation within the imaged lung apices. 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 There is minimal reconstitution of flow within the supraclinoid  left ICA. The M1 and M2 left middle cerebral arteries are patent, although with asymmetrically decreased opacification as compared to the right side. Additionally, there is a moderate to moderately severe focal stenosis within a superior division proximal M2 left MCA branch (series 8, image 17). The intracranial right internal carotid artery is patent. However, there are sites of severe stenosis within the cavernous and paraclinoid segments. The M1 right middle cerebral artery is patent without significant stenosis. No right M2 proximal branch occlusion is identified. There is a moderate to moderately severe stenosis within an inferior division mid M2 right MCA branch vessel (series 9, image 19). The A1 left anterior cerebral artery is hypoplastic. The A1 right anterior cerebral artery is dominant. The A2 and more distal anterior cerebral arteries are patent. However, there are multifocal high-grade stenoses within the bilateral A2 anterior cerebral arteries. Posterior circulation: The non dominant intracranial left vertebral artery is developmentally diminutive, but patent and terminates as the left PICA. The dominant intracranial right vertebral artery is patent, although with sites of moderate stenosis. The basilar artery is patent. The posterior cerebral arteries are patent. However, there is a high-grade focal stenosis within the P2 left  PCA and high-grade focal stenosis within the right PCA at the P2/P3 junction. Venous sinuses: Within limitations of contrast timing, no convincing thrombus. Anatomic variants: Posterior communicating arteries are hypoplastic or absent bilaterally. Review of the MIP images confirms the above findings CT Brain Perfusion Findings: ASPECTS: 4 CBF (<30%) Volume: 56mL (left MCA vascular territory) Perfusion (Tmax>6.0s) volume: (left MCA vascular territory) Mismatch Volume: 52mL Infarction Location:Left MCA vascular territory. These results were called by telephone at the time  of interpretation on 02/23/2020 at 12:40 pm to provider [facility] , who verbally acknowledged these results. IMPRESSION: CTA neck: 1. 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 2. There is at least moderate calcified plaque at the right carotid bifurcation. There is stenosis at the origin of the cervical right vertebral artery which is poorly quantified due to motion degradation and blooming artifact from calcified plaque at this site. Carotid artery duplex is recommended for further evaluation. Additionally, there is apparent 50-60% stenosis of the mid cervical right ICA. 3. Moderate/severe atherosclerotic narrowing within the Dom V1 right vertebral artery. 4. The left vertebral artery is developmentally diminutive, but patent throughout the neck. CTA head: 1. The left internal carotid artery remains occluded intracranially throughout the siphon region. There is minimal reconstitution of flow within the supraclinoid left ICA. The M1 and M2 left middle cerebral arteries are patent, although with asymmetrically decreased opacification as compared to the right side. Additionally, there is a moderate to moderately severe focal stenosis within a superior division proximal M2 left MCA branch. 2. Additional intracranial atherosclerotic disease with multifocal stenoses, most notably as follows. 3. High-grade stenoses within the cavernous/paraclinoid right ICA. 4. Moderate to moderately severe focal stenosis within an inferior division mid M2 right MCA branch vessel. 5. Moderate stenosis within the V4 right vertebral artery. 6. High-grade stenosis within the P2 left PCA. 7. High-grade stenosis within the right PCA at the P2/P3 junction. 8. Multifocal high-grade stenoses within the A2 anterior cerebral arteries bilaterally. CT perfusion head: The perfusion software identifies an 89 mL core infarct within the left MCA vascular territory. The perfusion  software identifies a 149 mL region of critically hypoperfused parenchyma within the left MCA vascular territory. Reported mismatch volume 60 mL. Electronically Signed   By: Jackey Loge DO   On: 02/23/2020 12:53   CT ANGIO NECK CODE STROKE  Result Date: 02/23/2020 CLINICAL DATA:  Neuro deficit, acute, stroke suspected. EXAM: CT ANGIOGRAPHY HEAD AND NECK CT PERFUSION BRAIN TECHNIQUE: Multidetector CT imaging of the head and neck was performed using the standard protocol during bolus administration of intravenous contrast. Multiplanar CT image reconstructions and MIPs were obtained to evaluate the vascular anatomy. Carotid stenosis measurements (when applicable) are obtained utilizing NASCET criteria, using the distal internal carotid diameter as the denominator. Multiphase CT imaging of the brain was performed following IV bolus contrast injection. Subsequent parametric perfusion maps were calculated using RAPID software. CONTRAST:  OMNIPAQUE IOHEXOL 350 MG/ML SOLN COMPARISON:  Noncontrast head CT performed earlier the same day. FINDINGS: CTA NECK FINDINGS Aortic arch: The left vertebral artery arises directly from the aortic arch. Atherosclerotic plaque within the visualized aortic arch and proximal major branch vessels of the neck. No hemodynamically significant innominate or proximal subclavian artery stenosis. Right carotid system: The CCA and ICA are patent within the neck. Atherosclerotic plaque within these vessels. Most notably there is at least moderate calcified plaque within the carotid bifurcation. Exact quantification of stenosis  within the proximal right ICA is difficult due to motion artifact and blooming artifact of calcified plaque at this site. Additionally, there is 50-60% stenosis of the mid cervical ICA. Left carotid system: The CCA is patent to the bifurcation. 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 Vertebral arteries: The dominant  right vertebral artery is patent throughout the neck. Moderate/severe atherosclerotic narrowing within the V1 right vertebral artery. The non dominant left vertebral artery is developmentally diminutive, but patent throughout the neck Skeleton: Reversal of the expected cervical lordosis. Cervical spondylosis with multilevel disc space narrowing, posterior disc osteophytes and uncovertebral hypertrophy. Other neck: No neck mass or cervical lymphadenopathy. Upper chest: No consolidation within the imaged lung apices. 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 There is minimal reconstitution of flow within the supraclinoid left ICA. The M1 and M2 left middle cerebral arteries are patent, although with asymmetrically decreased opacification as compared to the right side. Additionally, there is a moderate to moderately severe focal stenosis within a superior division proximal M2 left MCA branch (series 8, image 17). The intracranial right internal carotid artery is patent. However, there are sites of severe stenosis within the cavernous and paraclinoid segments. The M1 right middle cerebral artery is patent without significant stenosis. No right M2 proximal branch occlusion is identified. There is a moderate to moderately severe stenosis within an inferior division mid M2 right MCA branch vessel (series 9, image 19). The A1 left anterior cerebral artery is hypoplastic. The A1 right anterior cerebral artery is dominant. The A2 and more distal anterior cerebral arteries are patent. However, there are multifocal high-grade stenoses within the bilateral A2 anterior cerebral arteries. Posterior circulation: The non dominant intracranial left vertebral artery is developmentally diminutive, but patent and terminates as  the left PICA. The dominant intracranial right vertebral artery is patent, although with sites of moderate stenosis. The basilar artery is patent. The posterior cerebral arteries are patent. However, there is a high-grade focal stenosis within the P2 left PCA and high-grade focal stenosis within the right PCA at the P2/P3 junction. Venous sinuses: Within limitations of contrast timing, no convincing thrombus. Anatomic variants: Posterior communicating arteries are hypoplastic or absent bilaterally. Review of the MIP images confirms the above findings CT Brain Perfusion Findings: ASPECTS: 4 CBF (<30%) Volume: 89mL (left MCA vascular territory) Perfusion (Tmax>6.0s) volume: (left MCA vascular territory) Mismatch Volume: 60mL Infarction Location:Left MCA vascular territory. These results were called by telephone at the time of interpretation on 02/23/2020 at 12:40 pm to provider [facility] , who verbally acknowledged these results. IMPRESSION: CTA neck: 1. 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 2. There is at least moderate calcified plaque at the right carotid bifurcation. There is stenosis at the origin of the cervical right vertebral artery which is poorly quantified due to motion degradation and blooming artifact from calcified plaque at this site. Carotid artery duplex is recommended for further evaluation. Additionally, there is apparent 50-60% stenosis of the mid cervical right ICA. 3. Moderate/severe atherosclerotic narrowing within the Dom V1 right vertebral artery. 4. The left vertebral artery is developmentally diminutive, but patent throughout the neck. CTA head: 1. The left internal carotid artery remains occluded intracranially throughout the siphon  region. There is minimal reconstitution of flow within the supraclinoid left ICA. The M1 and M2 left middle cerebral arteries are patent, although with asymmetrically decreased  opacification as compared to the right side. Additionally, there is a moderate to moderately severe focal stenosis within a superior division proximal M2 left MCA branch. 2. Additional intracranial atherosclerotic disease with multifocal stenoses, most notably as follows. 3. High-grade stenoses within the cavernous/paraclinoid right ICA. 4. Moderate to moderately severe focal stenosis within an inferior division mid M2 right MCA branch vessel. 5. Moderate stenosis within the V4 right vertebral artery. 6. High-grade stenosis within the P2 left PCA. 7. High-grade stenosis within the right PCA at the P2/P3 junction. 8. Multifocal high-grade stenoses within the A2 anterior cerebral arteries bilaterally. CT perfusion head: The perfusion software identifies an 89 mL core infarct within the left MCA vascular territory. The perfusion software identifies a 149 mL region of critically hypoperfused parenchyma within the left MCA vascular territory. Reported mismatch volume 60 mL. Electronically Signed   By: Jackey Loge DO   On: 02/23/2020 12:53     Time Spent in minutes  30     Laverna Peace M.D on 03/08/2020 at 1:53 PM  To page go to [email or web address] - password [facility]

## 2020-03-08 NOTE — TOC Progression Note (Signed)
Transition of Care [facility]) - Progression Note    Patient Details  Name: Jonathan [name] MRN: [id number] Date of Birth: 1961-02-23  Transition of [facility]) CM/SW Contact  Cleda Clarks, [place] Phone Number: 03/08/2020, 1:12 PM  Clinical Narrative:    CSW spoke with Thayer Ohm of [facility] and confirmed no bed availability today.   Expected Discharge Plan: Skilled Nursing Facility Barriers to Discharge: English as a second language teacher, Continued Medical Work up  Expected Discharge Plan and Services Expected Discharge Plan: Skilled Nursing Facility     Post Acute Care Choice: Skilled Nursing Facility Living arrangements for the past 2 months: Single Family Home                                       Social Determinants of Health (SDOH) Interventions    Readmission Risk Interventions No flowsheet data found.

## 2020-03-09 DIAGNOSIS — G936 Cerebral edema: Secondary | ICD-10-CM | POA: Diagnosis present

## 2020-03-09 DIAGNOSIS — I6522 Occlusion and stenosis of left carotid artery: Secondary | ICD-10-CM

## 2020-03-09 DIAGNOSIS — I63512 Cerebral infarction due to unspecified occlusion or stenosis of left middle cerebral artery: Secondary | ICD-10-CM

## 2020-03-09 DIAGNOSIS — N5089 Other specified disorders of the male genital organs: Secondary | ICD-10-CM

## 2020-03-09 DIAGNOSIS — R7401 Elevation of levels of liver transaminase levels: Secondary | ICD-10-CM

## 2020-03-09 DIAGNOSIS — I4891 Unspecified atrial fibrillation: Secondary | ICD-10-CM

## 2020-03-09 DIAGNOSIS — E870 Hyperosmolality and hypernatremia: Secondary | ICD-10-CM

## 2020-03-09 DIAGNOSIS — R1310 Dysphagia, unspecified: Secondary | ICD-10-CM

## 2020-03-09 LAB — SARS CORONAVIRUS 2 BY RT PCR (HOSPITAL ORDER, PERFORMED IN ~~LOC~~ HOSPITAL LAB): SARS Coronavirus 2: NEGATIVE

## 2020-03-09 MED ORDER — POLYETHYLENE GLYCOL 3350 17 G PO PACK: 17.0000 g | PACK | Freq: Every day | ORAL | 0 refills | Status: AC | PRN

## 2020-03-09 MED ORDER — APIXABAN 5 MG PO TABS: 5.0000 mg | ORAL_TABLET | Freq: Two times a day (BID) | ORAL | Status: AC

## 2020-03-09 MED ORDER — DOCUSATE SODIUM 100 MG PO CAPS: 100.0000 mg | ORAL_CAPSULE | Freq: Two times a day (BID) | ORAL | 0 refills | Status: AC | PRN

## 2020-03-09 MED ORDER — ATORVASTATIN CALCIUM 80 MG PO TABS: 80.0000 mg | ORAL_TABLET | Freq: Every day | ORAL | Status: DC

## 2020-03-09 MED ORDER — GUAIFENESIN ER 600 MG PO TB12: 600.0000 mg | ORAL_TABLET | Freq: Two times a day (BID) | ORAL | Status: AC

## 2020-03-09 MED ORDER — CHLORHEXIDINE GLUCONATE 0.12 % MT SOLN: 15.0000 mL | Freq: Two times a day (BID) | OROMUCOSAL | 0 refills | Status: AC

## 2020-03-09 MED ORDER — LISINOPRIL 40 MG PO TABS: 40.0000 mg | ORAL_TABLET | Freq: Every day | ORAL | Status: AC

## 2020-03-09 MED ORDER — ADULT MULTIVITAMIN W/MINERALS CH: 1.0000 | ORAL_TABLET | Freq: Every day | ORAL | Status: AC

## 2020-03-09 MED ORDER — CLOPIDOGREL BISULFATE 75 MG PO TABS: 75.0000 mg | ORAL_TABLET | Freq: Every day | ORAL | Status: AC

## 2020-03-09 MED ORDER — METOPROLOL TARTRATE 75 MG PO TABS: 75.0000 mg | ORAL_TABLET | Freq: Two times a day (BID) | ORAL | Status: AC

## 2020-03-09 MED ORDER — PANTOPRAZOLE SODIUM 40 MG PO TBEC: 40.0000 mg | DELAYED_RELEASE_TABLET | Freq: Every day | ORAL | Status: AC

## 2020-03-09 MED ORDER — ENSURE ENLIVE PO LIQD: 237.0000 mL | Freq: Two times a day (BID) | ORAL | 12 refills | Status: AC

## 2020-03-09 MED ORDER — AMLODIPINE BESYLATE 10 MG PO TABS: 10.0000 mg | ORAL_TABLET | Freq: Every day | ORAL | Status: AC

## 2020-03-09 MED ORDER — NICOTINE 21 MG/24HR TD PT24: 21.0000 mg | MEDICATED_PATCH | Freq: Every day | TRANSDERMAL | 0 refills | Status: AC

## 2020-03-09 MED ORDER — FOLIC ACID 1 MG PO TABS: 1.0000 mg | ORAL_TABLET | Freq: Every day | ORAL | Status: AC

## 2020-03-09 MED ORDER — ALBUTEROL SULFATE (2.5 MG/3ML) 0.083% IN NEBU: 3.0000 mL | INHALATION_SOLUTION | Freq: Four times a day (QID) | RESPIRATORY_TRACT | 12 refills | Status: AC | PRN

## 2020-03-09 MED ORDER — POTASSIUM CHLORIDE CRYS ER 20 MEQ PO TBCR: 40.0000 meq | EXTENDED_RELEASE_TABLET | Freq: Every day | ORAL | Status: DC

## 2020-03-09 NOTE — Progress Notes (Signed)
  Speech Language Pathology Treatment: Cognitive-Linquistic;Dysphagia  Patient Details Name: Jonathan [name] MRN: [id number] DOB: 08/10/1960 Today's Date: 03/09/2020 Time: 0926-1008 SLP Time Calculation (min) (ACUTE ONLY): 42 min  Assessment / Plan / Recommendation Clinical Impression  Pt seen today to assess po tolerance, determine readiness for diet advancement and for aphasia treatment;  Pt today awake and cooperative.  He participated in functional language treatment and used pointing/gestures for expressive communication including pointing to closet for location of denture adhesive.  SLP instructed pt to brush his gums using mod I.  Moderate visual cues for pt to don dentures starting on the weak right sided needed x2.   No denture adhesive located in pt's room but SLP phoned pt's wife requesting her to bring some for him.  Pt identified his wife's name from written choice of two with Mod I.  Pt verbalized "[name]" reading  with mod verbal/visual and auditory cues.   The lengthier pt's verbalization becomes, the increased dysfluency noted.  Social tasks and automatic tasks *counting* trialed with success with initial numbers.  He demonstrates awareness to difficulties indicating frustration via nonverbal hand waving.  Pt grimacing - concerning for pain - however he adamantly shook his head to indicate "no", even with written choice of two. When asked re: need for bowel movement, pt indicated so so with hand - therefore called to request bathroom for pt.    Dysphagia treatment including observing pt with use of straws to decrease caregiver burden with feeding and foster independence.  No indication of aspiration even with pt taking sequential boluses.  Improved labial closure noted without anterior loss today.    Upon SlP leaving, pt waved and articulated "bye".  Use of written and verbal cues helpful to maximize communication.  Advised pt that SLP will start pt with single functional bilabial  words for verbal communication.  Pt is making progress and smiled x2 during session today.  He will continue to benefit from SlP for aphasia and dysphagia treatment.     HPI HPI: 59 y.o. male with a history of hypertension, hyperlipidemia, CVA, severe PAD  who presented with acute critical limb ischemia secondary to occlusion of the iliac artery who underwent stenting on 8/6 at [facility].  He was taken back to the OR on 8/7 found to have acute thrombosis of bilateral iliac stents underwent thrombectomy and endarterectomy;  fasciotomies of the left leg. Extubated 8/8. Developed right hemiplegia and aphasia morning of 8/8. CTH: large territory infarct left MCA. Pt transferred to [facility] same day.        SLP Plan  Continue with current plan of care       Recommendations  Diet recommendations: Dysphagia 1 (puree);Thin liquid Liquids provided via: Cup (straw ok) Medication Administration: Whole meds with puree Supervision: Full supervision/cueing for compensatory strategies Compensations: Minimize environmental distractions;Slow rate;Small sips/bites Postural Changes and/or Swallow Maneuvers: Seated upright 90 degrees;Upright 30-60 min after meal                Oral Care Recommendations: Oral care BID Follow up Recommendations: Skilled Nursing facility (per family preference) SLP Visit Diagnosis: Dysphagia, oropharyngeal phase (R13.12);Aphasia (R47.01) Plan: Continue with current plan of care       GO                Chales Abrahams 03/09/2020, 10:18 AM

## 2020-03-09 NOTE — Discharge Summary (Addendum)
Jonathan [name] VQQ:[id number] DOB: 1960/09/15 DOA: 02/23/2020  PCP: Dione Housekeeper, MD  Admit date: 02/23/2020 Discharge date: 03/10/2020  Admitted From: Home Disposition: Skilled nursing facility  Recommendations for Outpatient Follow-up:  1. Follow up with PCP in 1-2 weeks 2. Please obtain BMP in one week--to check K  3. Vascular followup with Dr. Myra Gianotti to be arranged as outpatient 4. Please follow up on the following pending results:  Home Health: None Equipment/Devices:None  Discharge Condition: Stable CODE STATUS: Full code Diet recommendation:  Diet recommendations: Dysphagia 1 (puree);Thin liquid Liquids provided via: Cup (straw ok) Medication Administration: Whole meds with puree Supervision: Full supervision/cueing for compensatory strategies Compensations: Minimize environmental distractions;Slow rate;Small sips/bites Postural Changes and/or Swallow Maneuvers: Seated upright 90 degrees;Upright 30-60 min after meal  Brief/Interim Summary: History of present illness:  Jonathan [name] is a 59 y.o. year old male with medical history significant for HTN, HLD, chronic smoking, significant peripheral artery disease who presented on 02/21/2020 to [facility] with worsening left leg pain for the past month and was admitted for  for acute limb ischemia.  On 8/6 patient underwent emergent angiogram and stent placement and bilateral iliac arteries and external iliac arteries, course complicated by acute thrombosis of iliac stents requiring thrombectomy and endarterectomy of plaque in left groin and compartment fasciotomies of the left leg.  On 8/7 argatroban drip was also started due to concern for heparin-induced thrombocytopenia.  On 8/8 in setting of new slurred speech and right-sided weakness code stroke was called in CT head was obtained which showed left-sided MCA he was transferred to [facility] for neuro ICU monitoring due to concern for malignant cerebral edema.  No  TPA was given due to patient being on argatroban.  Was found to have left ICA occlusion on..  Patient was placed on hypertonic saline for cerebral edema related to acute stroke.  Hypertonic saline was able to be discontinued on 8/13 and patient was transferred to hospital service for further management of care   Remaining hospital course addressed in problem based format below:   Hospital Course:   Acute left MCA stroke, left ICA occlusion.  Large left MCA stroke on CT head, repeat CT head on 8/13 showed stable MCA and improved midline shift .  Continues to have right-sided hemiplegia and receptive/expressive aphasia -Continue Eliquis, Plavix as recommended by Neurology and Vascular team --Continue  Lipitor -Follow up with Dr. Myra Gianotti as outpatient in a few weeks arranged  Cerebral edema in setting of above.  Briefly required hypertonic normal saline during hospital course that led to some hypernatremia.  No longer on hypertonic saline, sodium levels within normal limits, repeat CT scan shows stable cerebral edema without worsening midline shift   Acute limb ischemia, status post angioplasty and stent placement.  Complicated by thrombectomy/endarterectomy with fasciotomy -Continue Eliquis and Plavix -Vascular surgery plans to follow-up as outpatient with Dr. Myra Gianotti  Concern for HIT, ruled out.  HIT panel negative.  Required IV argatroban during work-up. -Continue to monitor on Eliquis  Atrial fibrillation with RVR, new onset.  Currently rate controlled.  Briefly required Cardizem drip for RVR. -Normal rate on metoprolol 75 mg twice daily -TTE with preserved EF -Currently on Eliquis  Scrotal edema, stable In the setting of left groin fasciotomy -Continue to monitor -Scrotal sling  Elevated troponin, suspect demand ischemia.  High-sensitivity troponin elevated peak of 3089, downtrended to 2227 , EF remains preserved with no wall motion abnormalities, and no ischemic changes on EKG  (initial  concern for ST changes on 8/10, repeat EKG showed none).  Likely demand ischemia in the setting of A. fib and acute CVA  Dysphagia secondary to stroke.  No aspiration events in the hospital Has rhonchorous breath sounds(improved from 8/21) -Speech recommends dysphagia 1 diet with thin liquids  -Aspiration precautions -encourage incentive spirometer, flutter valve, continue scheduled Mucinex  HTN, stable -Continue metoprolol, lisinopril, amlodipine  HLD, stable -Continue Lipitor  Transaminitis, stable ALT 62, AST 35 ( peak of 203) -Okay to continue Lipitor  Acute blood loss anemia in setting of operative intervention as mentioned above -Hemoglobin stable (10.4)  Hypokalemia, resolved. K of 4 on discharge day -Improved with oral replacement, does not need to continue on discharge      Consultations:  Vascular, PCCM, neurology  Procedures/Studies: Peripheral vascular catheterization, 8/6   Bilateral iliofemoral thromboembolectomy, left femoral endarterectomy with angioplasty, left below-knee popliteal artery exposure and tibial thrombectomy, compartment left leg fasciotomy, bilateral groin Prevena wound VAC, 8/7  TTE, 8/9  1. Left ventricular ejection fraction, by estimation, is 60 to 65%. Left  ventricular ejection fraction by 2D MOD biplane is 63.2 %. The left  ventricle has normal function. The left ventricle has no regional wall  motion abnormalities. Left ventricular  diastolic parameters are indeterminate.  2. Right ventricular systolic function is normal. The right ventricular  size is normal. There is normal pulmonary artery systolic pressure. The  estimated right ventricular systolic pressure is 30.9 mmHg.  3. The mitral valve is grossly normal. No evidence of mitral valve  regurgitation. No evidence of mitral stenosis.  4. The aortic valve is tricuspid. Aortic valve regurgitation is not  visualized. No aortic stenosis is present.  5. The  inferior vena cava is normal in size with greater than 50%  respiratory variability, suggesting right atrial pressure of 3 mmHg.  Subjective:  Discharge Exam: Vitals:   03/10/20 0808 03/10/20 1114  BP: 127/71 129/67  Pulse: 70 62  Resp: 18   Temp: (!) 97.4 F (36.3 C) 98.8 F (37.1 C)  SpO2: 96% 99%   Vitals:   03/10/20 0355 03/10/20 0500 03/10/20 0808 03/10/20 1114  BP: 126/68  127/71 129/67  Pulse: 63  70 62  Resp: 18  18   Temp: 99 F (37.2 C)  (!) 97.4 F (36.3 C) 98.8 F (37.1 C)  TempSrc: Oral  Oral Axillary  SpO2: 95%  96% 99%  Weight:  84.6 kg      General: Lying in bed, no apparent distress Eyes: EOMI, anicteric ENT: Oral Mucosa clear and moist Cardiovascular: regular rate and rhythm, no murmurs, rubs or gallops, no edema, Respiratory: Normal respiratory effort on room air, lungs clear to auscultation bilaterally Abdomen: soft, non-distended, non-tender, normal bowel sounds Skin: Bilateral groin incisions and left leg fasciotomy incision healing appropriately with staples in place GU: scrotal edema present and stable, no tenderness Neurologic: Right sided hemiplegia, expressive/receptive aphasia present, follows commands with the left side only Psychiatric:Appropriate affect, and mood  Discharge Diagnoses:  Active Problems:   Aphasia S/P CVA   Critical lower limb ischemia   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)   Essential hypertension   Tobacco abuse   Dyslipidemia   Leukocytosis   Acute blood loss anemia   Global aphasia   Cerebral edema (HCC)   Acute ischemic left MCA stroke (HCC)   ICAO (internal carotid artery occlusion), left   Hypernatremia   Atrial fibrillation with RVR (HCC)   Scrotal edema   Dysphagia  Transaminitis    Discharge Instructions  Discharge Instructions    Diet - low sodium heart healthy   Complete by: As directed    Diet recommendations: Dysphagia 1 (puree);Thin liquid Liquids provided via: Cup (straw  ok) Medication Administration: Whole meds with puree Supervision: Full supervision/cueing for compensatory strategies Compensations: Minimize environmental distractions;Slow rate;Small sips/bites Postural Changes and/or Swallow Maneuvers: Seated upright 90 degrees;Upright 30-60 min after meal   Increase activity slowly   Complete by: As directed    No wound care   Complete by: As directed      Allergies as of 03/10/2020   No Known Allergies     Medication List    STOP taking these medications   acetaminophen 650 MG suppository Commonly known as: TYLENOL   alum & mag hydroxide-simeth 200-200-20 MG/5ML suspension Commonly known as: MAALOX/MYLANTA   amLODipine-benazepril 10-20 MG capsule Commonly known as: LOTREL   aspirin 81 MG chewable tablet   aspirin EC 325 MG tablet   atenolol 50 MG tablet Commonly known as: TENORMIN   benazepril 20 MG tablet Commonly known as: LOTENSIN   guaiFENesin-dextromethorphan 100-10 MG/5ML syrup Commonly known as: ROBITUSSIN DM   insulin aspart 100 UNIT/ML injection Commonly known as: novoLOG   ondansetron 4 MG/2ML Soln injection Commonly known as: ZOFRAN     TAKE these medications   albuterol (2.5 MG/3ML) 0.083% nebulizer solution Commonly known as: PROVENTIL Inhale 3 mLs into the lungs every 6 (six) hours as needed for wheezing or shortness of breath.   amLODipine 10 MG tablet Commonly known as: NORVASC Take 1 tablet (10 mg total) by mouth daily.   apixaban 5 MG Tabs tablet Commonly known as: ELIQUIS Take 1 tablet (5 mg total) by mouth 2 (two) times daily.   atorvastatin 80 MG tablet Commonly known as: LIPITOR Take 1 tablet (80 mg total) by mouth daily. What changed: Another medication with the same name was removed. Continue taking this medication, and follow the directions you see here.   chlorhexidine 0.12 % solution Commonly known as: PERIDEX 15 mLs by Mouth Rinse route 2 (two) times daily. What changed: when to take  this   clopidogrel 75 MG tablet Commonly known as: PLAVIX Take 1 tablet (75 mg total) by mouth daily.   docusate sodium 100 MG capsule Commonly known as: COLACE Take 1 capsule (100 mg total) by mouth 2 (two) times daily as needed for mild constipation. What changed:   when to take this  reasons to take this  Another medication with the same name was removed. Continue taking this medication, and follow the directions you see here.   feeding supplement (ENSURE ENLIVE) Liqd Take 237 mLs by mouth 2 (two) times daily between meals.   folic acid 1 MG tablet Commonly known as: FOLVITE Take 1 tablet (1 mg total) by mouth daily.   guaiFENesin 600 MG 12 hr tablet Commonly known as: MUCINEX Take 1 tablet (600 mg total) by mouth 2 (two) times daily.   lisinopril 40 MG tablet Commonly known as: ZESTRIL Take 1 tablet (40 mg total) by mouth daily. What changed:   medication strength  how much to take  when to take this  Another medication with the same name was removed. Continue taking this medication, and follow the directions you see here.   Metoprolol Tartrate 75 MG Tabs Take 75 mg by mouth 2 (two) times daily.   multivitamin with minerals Tabs tablet Take 1 tablet by mouth daily.   nicotine 21 mg/24hr patch Commonly  known as: NICODERM CQ - dosed in mg/24 hours Place 1 patch (21 mg total) onto the skin daily at 8 pm. What changed: when to take this   pantoprazole 40 MG tablet Commonly known as: PROTONIX Take 1 tablet (40 mg total) by mouth daily at 8 pm.   polyethylene glycol 17 g packet Commonly known as: MIRALAX / GLYCOLAX Take 17 g by mouth daily as needed for moderate constipation. What changed:   when to take this  reasons to take this       Contact information for follow-up providers    Nada LibmanBrabham, Vance W, MD Follow up in 2 week(s).   Specialties: Vascular Surgery, Cardiology Why: office will call Contact information: [address] [place]  [id number] [phone]            Contact information for after-discharge care    Destination    [facility] Preferred SNF .   Service: Skilled Nursing Contact information: [address] [place] [phone]                 No Known Allergies      The results of significant diagnostics from this hospitalization (including imaging, microbiology, ancillary and laboratory) are listed below for reference.     Microbiology: Recent Results (from the past 240 hour(s))  SARS Coronavirus 2 by RT PCR (hospital order, performed in [facility] lab) Nasopharyngeal Nasopharyngeal Swab     Status: None   Collection Time: 03/09/20 12:24 PM   Specimen: Nasopharyngeal Swab  Result Value Ref Range Status   SARS Coronavirus 2 NEGATIVE NEGATIVE Final    Comment: (NOTE) SARS-CoV-2 target nucleic acids are NOT DETECTED.  The SARS-CoV-2 RNA is generally detectable in upper and lower respiratory specimens during the acute phase of infection. The lowest concentration of SARS-CoV-2 viral copies this assay can detect is 250 copies / mL. A negative result does not preclude SARS-CoV-2 infection and should not be used as the sole basis for treatment or other patient management decisions.  A negative result may occur with improper specimen collection / handling, submission of specimen other than nasopharyngeal swab, presence of viral mutation(s) within the areas targeted by this assay, and inadequate number of viral copies (<250 copies / mL). A negative result must be combined with clinical observations, patient history, and epidemiological information.  Fact Sheet for Patients:   BoilerBrush.com.cyhttps://www.fda.gov/media/136312/download  Fact Sheet for Healthcare Providers: https://pope.com/https://www.fda.gov/media/136313/download  This test is not yet approved or  cleared by the [place] FDA and has been authorized for detection and/or diagnosis of SARS-CoV-2  by FDA under an Emergency Use Authorization (EUA).  This EUA will remain in effect (meaning this test can be used) for the duration of the COVID-19 declaration under Section 564(b)(1) of the Act, 21 U.S.C. section 360bbb-3(b)(1), unless the authorization is terminated or revoked sooner.  Performed at [facility], [address] [place], [place] [address]      Labs: BNP (last 3 results) No results for input(s): BNP in the last 8760 hours. Basic Metabolic Panel: Recent Labs  Lab 03/04/20 0137 03/10/20 0808  NA 137 135  K 3.5 4.0  CL 104 99  CO2 25 25  GLUCOSE 138* 175*  BUN 17 12  CREATININE 0.65 0.77  CALCIUM 8.5* 9.6   Liver Function Tests: No results for input(s): AST, ALT, ALKPHOS, BILITOT, PROT, ALBUMIN in the last 168 hours. No results for input(s): LIPASE, AMYLASE in the last 168 hours.  No results for input(s): AMMONIA in the last 168 hours. CBC: Recent Labs  Lab 03/04/20 0137  WBC 12.2*  NEUTROABS 9.6*  HGB 10.4*  HCT 32.5*  MCV 94.2  PLT 361   Cardiac Enzymes: No results for input(s): CKTOTAL, CKMB, CKMBINDEX, TROPONINI in the last 168 hours. BNP: Invalid input(s): POCBNP CBG: No results for input(s): GLUCAP in the last 168 hours. D-Dimer No results for input(s): DDIMER in the last 72 hours. Hgb A1c No results for input(s): HGBA1C in the last 72 hours. Lipid Profile No results for input(s): CHOL, HDL, LDLCALC, TRIG, CHOLHDL, LDLDIRECT in the last 72 hours. Thyroid function studies No results for input(s): TSH, T4TOTAL, T3FREE, THYROIDAB in the last 72 hours.  Invalid input(s): FREET3 Anemia work up No results for input(s): VITAMINB12, FOLATE, FERRITIN, TIBC, IRON, RETICCTPCT in the last 72 hours. Urinalysis    Component Value Date/Time   COLORURINE AMBER (A) 02/22/2020 1400   APPEARANCEUR CLOUDY (A) 02/22/2020 1400   APPEARANCEUR Clear 08/14/2012 1039   LABSPEC 1.032 (H) 02/22/2020 1400   LABSPEC 1.013 08/14/2012 1039   PHURINE 5.0  02/22/2020 1400   GLUCOSEU NEGATIVE 02/22/2020 1400   GLUCOSEU Negative 08/14/2012 1039   HGBUR MODERATE (A) 02/22/2020 1400   BILIRUBINUR NEGATIVE 02/22/2020 1400   BILIRUBINUR Negative 08/14/2012 1039   KETONESUR NEGATIVE 02/22/2020 1400   PROTEINUR 30 (A) 02/22/2020 1400   NITRITE NEGATIVE 02/22/2020 1400   LEUKOCYTESUR TRACE (A) 02/22/2020 1400   LEUKOCYTESUR Negative 08/14/2012 1039   Sepsis Labs Invalid input(s): PROCALCITONIN,  WBC,  LACTICIDVEN Microbiology Recent Results (from the past 240 hour(s))  SARS Coronavirus 2 by RT PCR (hospital order, performed in [facility] hospital lab) Nasopharyngeal Nasopharyngeal Swab     Status: None   Collection Time: 03/09/20 12:24 PM   Specimen: Nasopharyngeal Swab  Result Value Ref Range Status   SARS Coronavirus 2 NEGATIVE NEGATIVE Final    Comment: (NOTE) SARS-CoV-2 target nucleic acids are NOT DETECTED.  The SARS-CoV-2 RNA is generally detectable in upper and lower respiratory specimens during the acute phase of infection. The lowest concentration of SARS-CoV-2 viral copies this assay can detect is 250 copies / mL. A negative result does not preclude SARS-CoV-2 infection and should not be used as the sole basis for treatment or other patient management decisions.  A negative result may occur with improper specimen collection / handling, submission of specimen other than nasopharyngeal swab, presence of viral mutation(s) within the areas targeted by this assay, and inadequate number of viral copies (<250 copies / mL). A negative result must be combined with clinical observations, patient history, and epidemiological information.  Fact Sheet for Patients:   BoilerBrush.com.cy  Fact Sheet for Healthcare Providers: https://pope.com/  This test is not yet approved or  cleared by the [place] FDA and has been authorized for detection and/or diagnosis of SARS-CoV-2 by FDA under  an Emergency Use Authorization (EUA).  This EUA will remain in effect (meaning this test can be used) for the duration of the COVID-19 declaration under Section 564(b)(1) of the Act, 21 U.S.C. section 360bbb-3(b)(1), unless the authorization is terminated or revoked sooner.  Performed at [facility], [address] [place], [place] [address]      Time coordinating discharge: Over 30 minutes  SIGNED:   Laverna Peace, MD  [facility] 03/10/2020, 3:56 PM Pager   If 7PM-7AM, please contact night-coverage [email or web address] Password [email or web address]

## 2020-03-09 NOTE — Progress Notes (Signed)
Physical Therapy Treatment Patient Details Name: Jonathan [name] MRN: [id number] DOB: Mar 19, 1961 Today's Date: 03/09/2020    History of Present Illness Pt is 59 y/o M with PMH: DDD, HTN, HLD, h/o CVA with some acute aphasia (wife reports his speech was normal before this admission), and PAD s/p stent in 2017. Presented with L LE pain. Now s/p angio with PCI and stent placement on 8/6 and revascularization of L LE on 8/7. Extubated 02/23/20. Of note, during OT assessment, significant R sided weakness and significant aphasia present. OT notified RN immediately who called code stroke.    PT Comments    Continuing work on functional mobility and activity tolerance;  Session focused on functional transfers, with noted good push up to sit from L sidelying using LUE; Performed lateral scoot transfer with +2 Max assist and bed pad; Anticipate continuing progress at post-acute rehabilitation.    Follow Up Recommendations  SNF (family requesting therapies closer to home in SNF)     Equipment Recommendations  Other (comment) (TBA at next venue)    Recommendations for Other Services       Precautions / Restrictions Precautions Precautions: Fall Precaution Comments: scrotal sling in room with instructions. RUE and RLE flaccid    Mobility  Bed Mobility Overal bed mobility: Needs Assistance Bed Mobility: Rolling;Sidelying to Sit Rolling: Mod assist Sidelying to sit: Mod assist;+2 for safety/equipment       General bed mobility comments: Opted to try and roll L; required heavy mod assist to help control RUE, and help flex at R upper trunk to coem to sidelying; assist to help clear LEs from EOB, and noted very good push up from L sidelying to sit with LUE  Transfers Overall transfer level: Needs assistance Equipment used: 2 person hand held assist (and bed pad) Transfers: Lateral/Scoot Transfers          Lateral/Scoot Transfers: Max assist;+2 physical assistance;+2 safety/equipment  (with bed pad) General transfer comment: pt able to perform lateral scoot to drop arm recliner with max A +2. Utalized bed pad under hips to help guide and to prevent sheering. VC for hand placement and sequencing  Ambulation/Gait                 Stairs             Wheelchair Mobility    Modified Rankin (Stroke Patients Only) Modified Rankin (Stroke Patients Only) Pre-Morbid Rankin Score: No symptoms Modified Rankin: Severe disability     Balance Overall balance assessment: Needs assistance   Sitting balance-Leahy Scale: Poor Sitting balance - Comments: min to modA for static sitting     Standing balance-Leahy Scale: Zero                              Cognition Arousal/Alertness: Awake/alert Behavior During Therapy: WFL for tasks assessed/performed Overall Cognitive Status: Difficult to assess                                 General Comments: Continuing his attemtps to communicate; cued to take his time when trying to talk; he uses getures heavily as well      Exercises      General Comments        Pertinent Vitals/Pain Pain Assessment: No/denies pain    Home Living  Prior Function            PT Goals (current goals can now be found in the care plan section) Acute Rehab PT Goals Patient Stated Goal: None stated, but agreeable to getting up and OOB PT Goal Formulation: With patient Time For Goal Achievement: 03/12/20 Potential to Achieve Goals: Fair Progress towards PT goals: Progressing toward goals (slowly)    Frequency    Min 3X/week      PT Plan Current plan remains appropriate    Co-evaluation              AM-PAC PT "6 Clicks" Mobility   Outcome Measure  Help needed turning from your back to your side while in a flat bed without using bedrails?: A Lot Help needed moving from lying on your back to sitting on the side of a flat bed without using bedrails?: A Lot Help  needed moving to and from a bed to a chair (including a wheelchair)?: A Lot Help needed standing up from a chair using your arms (e.g., wheelchair or bedside chair)?: A Lot Help needed to walk in hospital room?: Total Help needed climbing 3-5 steps with a railing? : Total 6 Click Score: 10    End of Session Equipment Utilized During Treatment: Gait belt Activity Tolerance: Patient tolerated treatment well Patient left: with call bell/phone within reach;with bed alarm set;in chair Nurse Communication: Mobility status;Need for lift equipment PT Visit Diagnosis: Unsteadiness on feet (R26.81);Other abnormalities of gait and mobility (R26.89);Muscle weakness (generalized) (M62.81);Difficulty in walking, not elsewhere classified (R26.2);Other symptoms and signs involving the nervous system (R29.898);Pain Pain - part of body:  (groin)     Time: 5170-0174 PT Time Calculation (min) (ACUTE ONLY): 20 min  Charges:  $Therapeutic Activity: 8-22 mins                     Van Clines, PT  Acute Rehabilitation Services Pager [phone] Office [phone]    Jonathan [name] 03/09/2020, 4:22 PM

## 2020-03-10 DIAGNOSIS — I4891 Unspecified atrial fibrillation: Secondary | ICD-10-CM

## 2020-03-10 DIAGNOSIS — I63512 Cerebral infarction due to unspecified occlusion or stenosis of left middle cerebral artery: Secondary | ICD-10-CM

## 2020-03-10 DIAGNOSIS — R7401 Elevation of levels of liver transaminase levels: Secondary | ICD-10-CM

## 2020-03-10 DIAGNOSIS — N5089 Other specified disorders of the male genital organs: Secondary | ICD-10-CM

## 2020-03-10 LAB — BASIC METABOLIC PANEL
Anion gap: 11 (ref 5–15)
BUN: 12 mg/dL (ref 6–20)
CO2: 25 mmol/L (ref 22–32)
Calcium: 9.6 mg/dL (ref 8.9–10.3)
Chloride: 99 mmol/L (ref 98–111)
Creatinine, Ser: 0.77 mg/dL (ref 0.61–1.24)
GFR calc Af Amer: >60 mL/min (ref >60)
GFR calc non Af Amer: >60 mL/min (ref >60)
Glucose, Bld: 175 mg/dL — ABNORMAL HIGH (ref 70–99)
Potassium: 4 mmol/L (ref 3.5–5.1)
Sodium: 135 mmol/L (ref 135–145)

## 2020-03-10 NOTE — TOC Transition Note (Signed)
Transition of Care [facility]) - CM/SW Discharge Note   Patient Details  Name: Jonathan [name] MRN: [id number] Date of Birth: November 08, 1960  Transition of Care [facility]) CM/SW Contact:  Tivis Ringer, Student-Social Work Phone Number: 03/10/2020, 1:44 PM   Clinical Narrative:   Nurse to call report to [phone] Rm. [phone] B.      Final next level of care: Skilled Nursing Facility Barriers to Discharge: Barriers Resolved   Patient Goals and CMS Choice Patient states their goals for this hospitalization and ongoing recovery are:: patient unable to participate in goal setting, aphasic and not fully oriented CMS Medicare.gov Compare Post Acute Care list provided to:: Patient Represenative (must comment) Choice offered to / list presented to : Spouse  Discharge Placement              Patient chooses bed at: [facility] [place] Patient to be transferred to facility by: [facility] Name of family member notified: [name] Patient and family notified of of transfer: 03/10/20  Discharge Plan and Services     Post Acute Care Choice: Skilled Nursing Facility                               Social Determinants of Health (SDOH) Interventions     Readmission Risk Interventions No flowsheet data found.

## 2020-03-10 NOTE — Progress Notes (Signed)
Physical Therapy Treatment Patient Details Name: Jonathan [name] MRN: [id number] DOB: 06-18-1961 Today's Date: 03/10/2020    History of Present Illness Pt is 59 y/o M with PMH: DDD, HTN, HLD, h/o CVA with some acute aphasia (wife reports his speech was normal before this admission), and PAD s/p stent in 2017. Presented with L LE pain. Now s/p angio with PCI and stent placement on 8/6 and revascularization of L LE on 8/7. Extubated 02/23/20. Of note, during OT assessment, significant R sided weakness and significant aphasia present. OT notified RN immediately who called code stroke.    PT Comments    Patient very motivated to work with therapy. Verbalizing much more (although often unintelligible). Continues to be limited due to rt sided hemiplegia and painful scrotal edema. Able to stand x 5 with use of stedy and 2 person assist.    Follow Up Recommendations  SNF (family requesting therapies closer to home in SNF)     Equipment Recommendations  Other (comment) (TBA at next venue)    Recommendations for Other Services       Precautions / Restrictions Precautions Precautions: Fall Precaution Comments: scrotal sling in room with instructions. RUE and RLE flaccid    Mobility  Bed Mobility Overal bed mobility: Needs Assistance Bed Mobility: Rolling;Supine to Sit;Sit to Supine Rolling: Min assist (with rail, heavy use of LUE)   Supine to sit: Mod assist;HOB elevated Sit to supine: Max assist;+2 for physical assistance;+2 for safety/equipment (+2 to protect Rt shoulder and lift RLE)   General bed mobility comments: rolling rt and left for pericare (prior to and after standing); pt with good sequencing/problem-solving; +2 for scooting to EOB with use of bed pad to limit shearing/pain in scrotum  Transfers Overall transfer level: Needs assistance   Transfers: Sit to/from Stand Sit to Stand: +2 physical assistance;Mod assist;Min assist;From elevated surface         General  transfer comment: with LUE pulling on stedy; assist for maintaining midline (leans to right); repeated x 5  Ambulation/Gait                 Stairs             Wheelchair Mobility    Modified Rankin (Stroke Patients Only) Modified Rankin (Stroke Patients Only) Pre-Morbid Rankin Score: No symptoms Modified Rankin: Severe disability     Balance Overall balance assessment: Needs assistance Sitting-balance support: Single extremity supported;Feet supported Sitting balance-Leahy Scale: Poor Sitting balance - Comments: closeguarding, no loss of balance Postural control: Right lateral lean Standing balance support: Bilateral upper extremity supported (OT supporting RUE; pt using LUE functionally) Standing balance-Leahy Scale: Poor Standing balance comment: once standing, progressed to minguard for up to 20 seconds with cues to keep his left shoulder against PT's shoulder (on his left)                            Cognition Arousal/Alertness: Awake/alert Behavior During Therapy: Impulsive (but will slow down with max cues) Overall Cognitive Status: Difficult to assess Area of Impairment: Following commands                       Following Commands: Follows one step commands consistently       General Comments: Continuing his attemtps to communicate; cued to take his time when trying to talk; he uses getures heavily as well      Exercises      General  Comments General comments (skin integrity, edema, etc.): Pt continues with scrotal edema (improved) however also having incontinence of bowels, therefore deferred use of sling. Utilized disposable brief/undergarment and pt was incontinent of bowels while mobilizing. Returned to bed for pericare and to allow elevation of scrotum      Pertinent Vitals/Pain Pain Assessment: Faces Faces Pain Scale: Hurts whole lot Pain Location: grimacing - ? LLE, definitely scrotum Pain Descriptors / Indicators:  Grimacing Pain Intervention(s): Monitored during session;Limited activity within patient's tolerance;Repositioned    Home Living                      Prior Function            PT Goals (current goals can now be found in the care plan section) Acute Rehab PT Goals Patient Stated Goal: None stated, but agreeable to therapy Time For Goal Achievement: 03/12/20 Potential to Achieve Goals: Fair Progress towards PT goals: Progressing toward goals    Frequency    Min 3X/week      PT Plan Current plan remains appropriate    Co-evaluation PT/OT/SLP Co-Evaluation/Treatment: Yes Reason for Co-Treatment: Complexity of the patient's impairments (multi-system involvement);For patient/therapist safety;To address functional/ADL transfers          AM-PAC PT "6 Clicks" Mobility   Outcome Measure  Help needed turning from your back to your side while in a flat bed without using bedrails?: A Lot Help needed moving from lying on your back to sitting on the side of a flat bed without using bedrails?: A Lot Help needed moving to and from a bed to a chair (including a wheelchair)?: A Lot Help needed standing up from a chair using your arms (e.g., wheelchair or bedside chair)?: A Lot Help needed to walk in hospital room?: Total Help needed climbing 3-5 steps with a railing? : Total 6 Click Score: 10    End of Session Equipment Utilized During Treatment: Gait belt Activity Tolerance: Patient tolerated treatment well Patient left: with call bell/phone within reach;with bed alarm set;in bed Nurse Communication: Mobility status;Need for lift equipment;Other (comment) (bed oddly positioned (trendelenburg) for scrotal edema) PT Visit Diagnosis: Unsteadiness on feet (R26.81);Other abnormalities of gait and mobility (R26.89);Muscle weakness (generalized) (M62.81);Difficulty in walking, not elsewhere classified (R26.2);Other symptoms and signs involving the nervous system (R29.898);Pain Pain  - part of body:  (groin)     Time: 7793-9030 PT Time Calculation (min) (ACUTE ONLY): 37 min  Charges:  $Neuromuscular Re-education: 8-22 mins                      Jerolyn Center, PT Pager [phone]    Zena Amos 03/10/2020, 10:17 AM

## 2020-03-10 NOTE — Progress Notes (Signed)
Occupational Therapy Treatment Patient Details Name: Jonathan [name] MRN: [id number] DOB: 1961/02/11 Today's Date: 03/10/2020    History of present illness Pt is 59 y/o M with PMH: DDD, HTN, HLD, h/o CVA with some acute aphasia (wife reports his speech was normal before this admission), and PAD s/p stent in 2017. Presented with L LE pain. Now s/p angio with PCI and stent placement on 8/6 and revascularization of L LE on 8/7. Extubated 02/23/20. Of note, during OT assessment, significant R sided weakness and significant aphasia present. OT notified RN immediately who called code stroke.   OT comments  Patient continues to make steady progress towards goals in skilled OT session. Patient's session encompassed sit<>stand transfers with stedy due to continued painful edema in scrotum making it uncomfortable for pt to remain in chair. While pt has scrotal sling, pt has been having frequent BMs therefore sling was left off to prevent from being soiled. Pt attempting to communicate, however is unable due to aphasia but will re-attempt with encouragement from therapists. Pt able to complete sit<>stands x5 with max cues to align in midline and able to sustain for 20 seconds prior to fatigue (OTA supporting RUE to prevent further subluxation). Discharge updated to SNF due to family wanting pt closer to home; will continue to follow acutely.   Follow Up Recommendations  SNF    Equipment Recommendations  Other (comment) (defer to next venue)    Recommendations for Other Services      Precautions / Restrictions Precautions Precautions: Fall Precaution Comments: scrotal sling in room with instructions. RUE and RLE flaccid Restrictions Weight Bearing Restrictions: No       Mobility Bed Mobility Overal bed mobility: Needs Assistance Bed Mobility: Rolling;Supine to Sit;Sit to Supine Rolling: Min assist (with use of rail with LUE)   Supine to sit: Mod assist;HOB elevated Sit to supine: Max  assist;+2 for physical assistance;+2 for safety/equipment (+2 to protect Rt shoulder and lift RLE)   General bed mobility comments: rolling rt and left for pericare (prior to and after standing); pt with good sequencing/problem-solving; +2 for scooting to EOB with use of bed pad to limit shearing/pain in scrotum  Transfers Overall transfer level: Needs assistance Equipment used: Ambulation equipment used Transfers: Sit to/from Stand Sit to Stand: +2 physical assistance;Mod assist;Min assist;From elevated surface         General transfer comment: with LUE pulling on stedy; assist for maintaining midline (leans to right); repeated x 5    Balance Overall balance assessment: Needs assistance Sitting-balance support: Single extremity supported;Feet supported Sitting balance-Leahy Scale: Poor Sitting balance - Comments: closeguarding, no loss of balance Postural control: Right lateral lean Standing balance support: Bilateral upper extremity supported (OT supporting RUE; pt using LUE functionally) Standing balance-Leahy Scale: Poor Standing balance comment: once standing, progressed to minguard for up to 20 seconds with cues to keep his left shoulder against PT's shoulder (on his left)                           ADL either performed or assessed with clinical judgement   ADL Overall ADL's : Needs assistance/impaired                     Lower Body Dressing: Total assistance;Bed level Lower Body Dressing Details (indicate cue type and reason): donning brief     Toileting- Clothing Manipulation and Hygiene: Total assistance;+2 for physical assistance;Bed level Toileting - Clothing Manipulation Details (indicate  cue type and reason): peri care pre and post donning of brief     Functional mobility during ADLs: Maximal assistance;+2 for physical assistance;+2 for safety/equipment;Moderate assistance;Cueing for safety;Cueing for sequencing General ADL Comments: Session  focus on sit<>stand transfers with stedy, noted continued bowel incontinence, scrotal sling left off due to frequency of BMs     Vision       Perception     Praxis      Cognition Arousal/Alertness: Awake/alert Behavior During Therapy: Impulsive (will adhere to max cues to slow down) Overall Cognitive Status: Difficult to assess Area of Impairment: Following commands                       Following Commands: Follows one step commands consistently       General Comments: Continuing his attemtps to communicate; cued to take his time when trying to talk; he uses getures heavily as well        Exercises     Shoulder Instructions       General Comments      Pertinent Vitals/ Pain       Pain Assessment: Faces Faces Pain Scale: Hurts whole lot Pain Location: grimacing - ? LLE, definitely scrotum Pain Descriptors / Indicators: Grimacing Pain Intervention(s): Limited activity within patient's tolerance;Monitored during session;Repositioned  Home Living                                          Prior Functioning/Environment              Frequency  Min 2X/week        Progress Toward Goals  OT Goals(current goals can now be found in the care plan section)  Progress towards OT goals: Progressing toward goals  Acute Rehab OT Goals Patient Stated Goal: None stated, but agreeable to therapy OT Goal Formulation: With patient Time For Goal Achievement: 03/12/20 Potential to Achieve Goals: Good  Plan Discharge plan remains appropriate;Frequency remains appropriate    Co-evaluation      Reason for Co-Treatment: Complexity of the patient's impairments (multi-system involvement);For patient/therapist safety;To address functional/ADL transfers PT goals addressed during session: Mobility/safety with mobility OT goals addressed during session: ADL's and self-care      AM-PAC OT "6 Clicks" Daily Activity     Outcome Measure   Help from  another person eating meals?: A Little Help from another person taking care of personal grooming?: A Lot Help from another person toileting, which includes using toliet, bedpan, or urinal?: Total Help from another person bathing (including washing, rinsing, drying)?: A Lot Help from another person to put on and taking off regular upper body clothing?: A Lot Help from another person to put on and taking off regular lower body clothing?: Total 6 Click Score: 11    End of Session Equipment Utilized During Treatment: Gait belt;Other (comment) (stedy)  OT Visit Diagnosis: Unsteadiness on feet (R26.81);Cognitive communication deficit (R41.841);Hemiplegia and hemiparesis Symptoms and signs involving cognitive functions: Cerebral infarction Hemiplegia - Right/Left: Right Hemiplegia - dominant/non-dominant: Dominant Hemiplegia - caused by: Cerebral infarction   Activity Tolerance Patient limited by pain   Patient Left in bed;with call bell/phone within reach;with bed alarm set   Nurse Communication Mobility status;Other (comment) (elevation of bed position to aid in scrotal edema)        Time: 3810-1751 OT Time Calculation (min): 37 min  Charges:  OT General Charges $OT Visit: 1 Visit OT Treatments $Self Care/Home Management : 8-22 mins  Pollyann Glen E. Kylee Nardozzi, COTA/L Acute Rehabilitation Services [phone]   Cherlyn Cushing 03/10/2020, 2:37 PM

## 2020-03-10 NOTE — Progress Notes (Addendum)
[facility]  PROGRESS NOTE  Jonathan [name] ZOX:[id number] DOB: 1961/05/16 DOA: 02/23/2020 PCP: Dione Housekeeper, MD Admit date - 02/23/2020   Admitting Physician Odette Fraction, MD  Outpatient Primary MD for the patient is Zada Finders Joycie Peek, MD  LOS - 16 Brief Narrative   Jonathan [name] is a 59 y.o. year old male with medical history significant for HTN, HLD, chronic smoking, significant peripheral artery disease who presented on 02/23/2020 worsening left leg pain for the past month and was admitted for high concern for acute limb ischemia, patient underwent emergent angiogram and stent placement and bilateral iliac arteries and external iliac arteries, course complicated by acute thrombosis of iliac stents requiring thrombectomy on 8/7 argatroban drip was also started due to concern for heparin-induced thrombocytopenia.  On 8/8 in setting of new slurred speech and right-sided weakness CT head was obtained which showed left-sided MCA he was transferred to [facility] for neuro ICU monitoring due to concern for malignant cerebral edema   Subjective  Today he has no complaints.    A & P   Acute left MCA stroke, left ICA occlusion.  Large left MCA stroke with CT head, repeat CT head on 8/13 showed stable MCA and improved midline shift .  Continues to have right-sided hemiplegia and receptive/expressive aphasia -Continue Eliquis, Plavix and Lipitor -Follow up with Dr. Myra Gianotti as outpatient in a few weeks arranged  Cerebral edema in setting of above.  Briefly required hypertonic normal saline during hospital course that led to some hypernatremia.  No longer on hypertonic saline, sodium levels within normal limits, repeat CT scan shows stable cerebral edema without worsening midline shift   Acute limb ischemia, status post angioplasty and stent placement.  Complicated by thrombectomy/endarterectomy with fasciotomy -Continue Eliquis and Plavix  Concern for HIT, ruled out.   HIT panel negative. -Continue to monitor on Eliquis  Atrial fibrillation with RVR, new onset.  Currently rate controlled.  Briefly required Cardizem drip. -Normal rate on metoprolol 75 mg twice daily -TTE with preserved EF -Currently on Eliquis  Scrotal edema, stable -Continue to monitor -Scrotal sling  Elevated troponin, suspect demand ischemia troponin elevated 2000 2059 down to 2227, EF remains preserved with no wall motion abnormalities, and no ischemic changes on EKG.  Likely demand ischemia in the setting of A. fib and CVA  Dysphagia secondary to stroke.  No aspiration events in the hospital Has rhonchorous breath sounds(improved from 8/21) -Speech recommends dysphagia 1 diet with thin liquids  -Aspiration precautions-encourage incentive spirometer, flutter valve, continue scheduled Mucinex  HTN, stable -Continue metoprolol, lisinopril, amlodipine  HLD, stable -Continue Lipitor  Transaminitis, continue trending -Okay to continue Lipitor  Acute blood loss anemia in setting of operative intervention as mentioned above -Hemoglobin stable-transfuse if hemoglobin less than 7  Hypokalemia, resolved -discontinue standing potassium      Family Communication  : None  Code Status : Full  Disposition Plan  :  Patient is from home. Anticipated d/c date:  1-2 days. Barriers to d/c or necessity for inpatient status:  Medically stable for discharge, currently awaiting SNF bed availability Consults  : Vascular surgery, neurology  Procedures  :    DVT Prophylaxis  : Eliquis, Plavix  Lab Results  Component Value Date   PLT 361 03/04/2020    Diet :  Diet Order            Diet - low sodium heart healthy           DIET -  DYS 1 Room service appropriate? No; Fluid consistency: Thin  Diet effective now                  Inpatient Medications Scheduled Meds: . amLODipine  10 mg Oral Daily  . apixaban  5 mg Oral BID  . atorvastatin  80 mg Oral Daily  . chlorhexidine   15 mL Mouth Rinse BID  . Chlorhexidine Gluconate Cloth  6 each Topical Daily  . clopidogrel  75 mg Oral Daily  . feeding supplement (ENSURE ENLIVE)  237 mL Oral BID BM  . folic acid  1 mg Oral Daily  . guaiFENesin  600 mg Oral BID  . lisinopril  40 mg Oral Daily  . mouth rinse  15 mL Mouth Rinse q12n4p  . metoprolol tartrate  75 mg Oral BID  . multivitamin with minerals  1 tablet Oral Daily  . nicotine  21 mg Transdermal Q2000  . pantoprazole  40 mg Oral Q2000  . potassium chloride  40 mEq Oral Daily   Continuous Infusions: . sodium chloride Stopped (02/25/20 1233)   PRN Meds:.sodium chloride, acetaminophen, albuterol, docusate sodium, ipratropium-albuterol, labetalol, ondansetron (ZOFRAN) IV, polyethylene glycol  Antibiotics  :   Anti-infectives (From admission, onward)   None       Objective   Vitals:   03/10/20 0355 03/10/20 0500 03/10/20 0808 03/10/20 1114  BP: 126/68  127/71 129/67  Pulse: 63  70 62  Resp: 18  18   Temp: 99 F (37.2 C)  (!) 97.4 F (36.3 C) 98.8 F (37.1 C)  TempSrc: Oral  Oral Axillary  SpO2: 95%  96% 99%  Weight:  84.6 kg      SpO2: 99 % O2 Flow Rate (L/min): 2 L/min FiO2 (%): 21 %  Wt Readings from Last 3 Encounters:  03/10/20 84.6 kg  02/21/20 79.6 kg  02/21/20 79.4 kg     Intake/Output Summary (Last 24 hours) at 03/10/2020 1430 Last data filed at 03/10/2020 0830 Gross per 24 hour  Intake 720 ml  Output 1900 ml  Net -1180 ml    Physical Exam:     Awake Alert, Oriented X 3, Normal affect Right sided hemiplegia, expressive/receptive aphasia present, follows commands with the left side Scrotal swelling present without tenderness Springer.AT, Normal respiratory effort on room air, rhonchourous breath sounds RRR,No Gallops,Rubs or new Murmurs,  +ve B.Sounds, Abd Soft, No tenderness, No rebound, guarding or rigidity. Staples in place on the left lower anterior leg    I have personally reviewed the following:   Data  Reviewed:  CBC Recent Labs  Lab 03/04/20 0137  WBC 12.2*  HGB 10.4*  HCT 32.5*  PLT 361  MCV 94.2  MCH 30.1  MCHC 32.0  RDW 12.7  LYMPHSABS 1.4  MONOABS 1.1*  EOSABS 0.1  BASOSABS 0.0    Chemistries  Recent Labs  Lab 03/04/20 0137 03/10/20 0808  NA 137 135  K 3.5 4.0  CL 104 99  CO2 25 25  GLUCOSE 138* 175*  BUN 17 12  CREATININE 0.65 0.77  CALCIUM 8.5* 9.6   ------------------------------------------------------------------------------------------------------------------ No results for input(s): CHOL, HDL, LDLCALC, TRIG, CHOLHDL, LDLDIRECT in the last 72 hours.  Lab Results  Component Value Date   HGBA1C 6.2 (H) 02/23/2020   ------------------------------------------------------------------------------------------------------------------ No results for input(s): TSH, T4TOTAL, T3FREE, THYROIDAB in the last 72 hours.  Invalid input(s): FREET3 ------------------------------------------------------------------------------------------------------------------ No results for input(s): VITAMINB12, FOLATE, FERRITIN, TIBC, IRON, RETICCTPCT in the last 72 hours.  Coagulation profile No  results for input(s): INR, PROTIME in the last 168 hours.  No results for input(s): DDIMER in the last 72 hours.  Cardiac Enzymes No results for input(s): CKMB, TROPONINI, MYOGLOBIN in the last 168 hours.  Invalid input(s): CK ------------------------------------------------------------------------------------------------------------------ No results found for: BNP  Micro Results Recent Results (from the past 240 hour(s))  SARS Coronavirus 2 by RT PCR (hospital order, performed in [facility] hospital lab) Nasopharyngeal Nasopharyngeal Swab     Status: None   Collection Time: 03/09/20 12:24 PM   Specimen: Nasopharyngeal Swab  Result Value Ref Range Status   SARS Coronavirus 2 NEGATIVE NEGATIVE Final    Comment: (NOTE) SARS-CoV-2 target nucleic acids are NOT DETECTED.  The  SARS-CoV-2 RNA is generally detectable in upper and lower respiratory specimens during the acute phase of infection. The lowest concentration of SARS-CoV-2 viral copies this assay can detect is 250 copies / mL. A negative result does not preclude SARS-CoV-2 infection and should not be used as the sole basis for treatment or other patient management decisions.  A negative result may occur with improper specimen collection / handling, submission of specimen other than nasopharyngeal swab, presence of viral mutation(s) within the areas targeted by this assay, and inadequate number of viral copies (<250 copies / mL). A negative result must be combined with clinical observations, patient history, and epidemiological information.  Fact Sheet for Patients:   BoilerBrush.com.cy  Fact Sheet for Healthcare Providers: https://pope.com/  This test is not yet approved or  cleared by the [place] FDA and has been authorized for detection and/or diagnosis of SARS-CoV-2 by FDA under an Emergency Use Authorization (EUA).  This EUA will remain in effect (meaning this test can be used) for the duration of the COVID-19 declaration under Section 564(b)(1) of the Act, 21 U.S.C. section 360bbb-3(b)(1), unless the authorization is terminated or revoked sooner.  Performed at [facility], [address] [place], [place] [address]     Radiology Reports CT HEAD WO CONTRAST  Result Date: 02/28/2020 CLINICAL DATA:  Stroke, follow-up. EXAM: CT HEAD WITHOUT CONTRAST TECHNIQUE: Contiguous axial images were obtained from the base of the skull through the vertex without intravenous contrast. COMPARISON:  CT head without contrast 02/26/2020, 02/24/2020, and 02/23/2019. FINDINGS: Brain: Large left MCA territory nonhemorrhagic infarct is again noted. Infarct involves the left lentiform nucleus and caudate head. Mass effect is again noted. Midline shift is  stable to slightly improved at 4.5 mm. Partial effacement of left lateral ventricle is again noted. Sulcal effacement is stable. No significant extra-axial hemorrhage is present. No significant right-sided infarcts are present. Cerebellar atrophy is stable. No acute infarcts are present in the posterior fossa. Vascular: Atherosclerotic changes are present within the cavernous internal carotid arteries bilaterally. No hyperdense vessel is present. Skull: Calvarium is intact. No focal lytic or blastic lesions are present. No significant extracranial soft tissue lesion is present. Sinuses/Orbits: The paranasal sinuses and mastoid air cells are clear. Remote right orbital blowout fracture is stable. Globes and orbits are otherwise within normal limits. IMPRESSION: 1. Stable appearance of large left MCA territory nonhemorrhagic infarct. 2. Stable to slightly improved midline shift. 3. No significant extra-axial hemorrhage. 4. Stable atrophy and white matter disease. Electronically Signed   By: Marin Roberts M.D.   On: 02/28/2020 06:33   CT HEAD WO CONTRAST  Result Date: 02/26/2020 CLINICAL DATA:  Follow-up examination for acute stroke. EXAM: CT HEAD WITHOUT CONTRAST TECHNIQUE: Contiguous axial images were obtained from the base of the skull through  the vertex without intravenous contrast. COMPARISON:  Prior CT from 02/24/2020. FINDINGS: Brain: Continued interval evolution of large left MCA territory infarct is seen, overall stable in size and distribution from previous. Associated regional mass effect with partial effacement of the left lateral ventricle and up to 5 mm of left-to-right shift, slightly worsened from previous. No hydrocephalus or ventricular trapping. Basilar cisterns remain patent. No evidence for hemorrhagic transformation. No other new acute large vessel territory infarct. No intracranial hemorrhage. No extra-axial fluid collection. Vascular: No hyperdense vessel. Scattered vascular  calcifications noted within the carotid siphons. Skull: Scalp soft tissues within normal limits. Calvarium unchanged. Sinuses/Orbits: Globes and orbital soft tissues demonstrate no acute finding. Paranasal sinuses and mastoid air cells remain largely clear. Other: None. IMPRESSION: 1. Continued interval evolution of large left MCA territory infarct, overall stable in size and distribution from previous. Associated regional mass effect with up to 5 mm of left-to-right shift, slightly worsened from previous. No hydrocephalus or ventricular trapping. No evidence for hemorrhagic transformation. 2. No other new acute intracranial abnormality. Electronically Signed   By: Rise Mu M.D.   On: 02/26/2020 02:07   CT HEAD WO CONTRAST  Result Date: 02/24/2020 CLINICAL DATA:  Stroke follow-up EXAM: CT HEAD WITHOUT CONTRAST TECHNIQUE: Contiguous axial images were obtained from the base of the skull through the vertex without intravenous contrast. COMPARISON:  02/23/2020 head CT. FINDINGS: Brain: Sequela of evolving left MCA territory infarct. 3-4 mm rightward midline shift is unchanged. No intracranial hemorrhage. No mass lesion. No ventriculomegaly or extra-axial fluid collection. Vascular: No hyperdense vessel. Bilateral skull base atherosclerotic calcifications. Skull: Negative for fracture or focal lesion. Sinuses/Orbits: Normal orbits. Soft tissue impacted left maxillary molar, unchanged. Mild right sphenoid sinus mucosal thickening. No mastoid effusion. Other: None. IMPRESSION: Evolving left MCA territory infarct with unchanged 3-4 mm rightward midline shift. No intracranial hemorrhage. Electronically Signed   By: Stana Bunting M.D.   On: 02/24/2020 09:29   CT HEAD WO CONTRAST  Result Date: 02/23/2020 CLINICAL DATA:  Stroke follow-up EXAM: CT HEAD WITHOUT CONTRAST TECHNIQUE: Contiguous axial images were obtained from the base of the skull through the vertex without intravenous contrast. COMPARISON:   Head CT 02/21/2020 FINDINGS: Brain: Large area of cytotoxic edema throughout the left MCA territory. No acute hemorrhage. 3 mm rightward midline shift. Vascular: Atherosclerotic calcification of the internal carotid arteries at the skull base. Skull: Normal. Negative for fracture or focal lesion. Sinuses/Orbits: No acute finding. Other: None. IMPRESSION: 1. Slightly increased cytotoxic edema within the left MCA territory. 2. No acute hemorrhage. 3. 3 mm rightward midline shift. Electronically Signed   By: Deatra Robinson M.D.   On: 02/23/2020 22:00   US Carotid Bilateral  Result Date: 02/23/2020 CLINICAL DATA:  Slurred speech.  Carotid stenosis. EXAM: BILATERAL CAROTID DUPLEX ULTRASOUND TECHNIQUE: Wallace Cullens scale imaging, color Doppler and duplex ultrasound were performed of bilateral carotid and vertebral arteries in the neck. COMPARISON:  CTA neck 02/23/2020 FINDINGS: Criteria: Quantification of carotid stenosis is based on velocity parameters that correlate the residual internal carotid diameter with NASCET-based stenosis levels, using the diameter of the distal internal carotid lumen as the denominator for stenosis measurement. The following velocity measurements were obtained: RIGHT ICA: 117/63 cm/sec CCA: 130/38 cm/sec SYSTOLIC ICA/CCA RATIO:  0.9 ECA: 220 cm/sec LEFT ICA: Occluded CCA: 75/10 cm/sec ECA: 195 cm/sec RIGHT CAROTID ARTERY: Mild atherosclerotic disease in the right common carotid artery. Heterogeneous plaque at the right carotid bulb. External carotid artery is patent with normal waveform. Atherosclerotic plaque  in the proximal internal carotid artery. Normal waveforms and velocities in the internal carotid artery. RIGHT VERTEBRAL ARTERY: Antegrade flow and normal waveform in the right vertebral artery. LEFT CAROTID ARTERY: Left common carotid artery is patent with intimal thickening. Left carotid artery is patent. External carotid artery is patent with normal waveform. Left internal carotid artery is  occluded near the origin. No flow in the mid or distal left internal carotid artery. LEFT VERTEBRAL ARTERY: Antegrade flow and normal waveform in the left vertebral artery. IMPRESSION: 1. 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 Findings correspond with recent CTA findings. 2. Atherosclerotic plaque involving the carotid arteries. Estimated degree of stenosis in the right internal carotid artery is less than 50%. 3. Patent vertebral arteries with antegrade flow. Electronically Signed   By: Richarda Overlie M.D.   On: 02/23/2020 14:42   PERIPHERAL VASCULAR CATHETERIZATION  Result Date: 02/21/2020 See op note  CT CEREBRAL PERFUSION W CONTRAST  Result Date: 02/23/2020 CLINICAL DATA:  Neuro deficit, acute, stroke suspected. EXAM: CT ANGIOGRAPHY HEAD AND NECK CT PERFUSION BRAIN TECHNIQUE: Multidetector CT imaging of the head and neck was performed using the standard protocol during bolus administration of intravenous contrast. Multiplanar CT image reconstructions and MIPs were obtained to evaluate the vascular anatomy. Carotid stenosis measurements (when applicable) are obtained utilizing NASCET criteria, using the distal internal carotid diameter as the denominator. Multiphase CT imaging of the brain was performed following IV bolus contrast injection. Subsequent parametric perfusion maps were calculated using RAPID software. CONTRAST:  OMNIPAQUE IOHEXOL 350 MG/ML SOLN COMPARISON:  Noncontrast head CT performed earlier the same day. FINDINGS: CTA NECK FINDINGS Aortic arch: The left vertebral artery arises directly from the aortic arch. Atherosclerotic plaque within the visualized aortic arch and proximal major branch vessels of the neck. No hemodynamically significant innominate or proximal subclavian artery stenosis. Right carotid system: The CCA and ICA are patent within the neck. Atherosclerotic plaque within these vessels. Most notably there is at least moderate calcified plaque within the  carotid bifurcation. Exact quantification of stenosis within the proximal right ICA is difficult due to motion artifact and blooming artifact of calcified plaque at this site. Additionally, there is 50-60% stenosis of the mid cervical ICA. Left carotid system: The CCA is patent to the bifurcation. 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 Vertebral arteries: The dominant right vertebral artery is patent throughout the neck. Moderate/severe atherosclerotic narrowing within the V1 right vertebral artery. The non dominant left vertebral artery is developmentally diminutive, but patent throughout the neck Skeleton: Reversal of the expected cervical lordosis. Cervical spondylosis with multilevel disc space narrowing, posterior disc osteophytes and uncovertebral hypertrophy. Other neck: No neck mass or cervical lymphadenopathy. Upper chest: No consolidation within the imaged lung apices. 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 There is minimal reconstitution of flow within the supraclinoid left ICA. The M1 and M2 left middle cerebral arteries are patent, although with asymmetrically decreased opacification as compared to the right side. Additionally, there is a moderate to moderately severe focal stenosis within a superior division proximal M2 left MCA branch (series 8, image 17). The intracranial right internal carotid artery is patent. However, there are sites of severe stenosis within the cavernous and paraclinoid segments. The M1 right middle cerebral  artery is patent without significant stenosis. No right M2 proximal branch occlusion is identified. There is a moderate to moderately severe stenosis within an inferior division mid M2 right MCA branch vessel  (series 9, image 19). The A1 left anterior cerebral artery is hypoplastic. The A1 right anterior cerebral artery is dominant. The A2 and more distal anterior cerebral arteries are patent. However, there are multifocal high-grade stenoses within the bilateral A2 anterior cerebral arteries. Posterior circulation: The non dominant intracranial left vertebral artery is developmentally diminutive, but patent and terminates as the left PICA. The dominant intracranial right vertebral artery is patent, although with sites of moderate stenosis. The basilar artery is patent. The posterior cerebral arteries are patent. However, there is a high-grade focal stenosis within the P2 left PCA and high-grade focal stenosis within the right PCA at the P2/P3 junction. Venous sinuses: Within limitations of contrast timing, no convincing thrombus. Anatomic variants: Posterior communicating arteries are hypoplastic or absent bilaterally. Review of the MIP images confirms the above findings CT Brain Perfusion Findings: ASPECTS: 4 CBF (<30%) Volume: 89mL (left MCA vascular territory) Perfusion (Tmax>6.0s) volume: (left MCA vascular territory) Mismatch Volume: 60mL Infarction Location:Left MCA vascular territory. These results were called by telephone at the time of interpretation on 02/23/2020 at 12:40 pm to provider [facility] , who verbally acknowledged these results. IMPRESSION: CTA neck: 1. 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 2. There is at least moderate calcified plaque at the right carotid bifurcation. There is stenosis at the origin of the cervical right vertebral artery which is poorly quantified due to motion degradation and blooming artifact from calcified plaque at this site. Carotid artery duplex is recommended for further evaluation. Additionally, there is apparent 50-60% stenosis of the mid cervical right ICA. 3. Moderate/severe  atherosclerotic narrowing within the Dom V1 right vertebral artery. 4. The left vertebral artery is developmentally diminutive, but patent throughout the neck. CTA head: 1. The left internal carotid artery remains occluded intracranially throughout the siphon region. There is minimal reconstitution of flow within the supraclinoid left ICA. The M1 and M2 left middle cerebral arteries are patent, although with asymmetrically decreased opacification as compared to the right side. Additionally, there is a moderate to moderately severe focal stenosis within a superior division proximal M2 left MCA branch. 2. Additional intracranial atherosclerotic disease with multifocal stenoses, most notably as follows. 3. High-grade stenoses within the cavernous/paraclinoid right ICA. 4. Moderate to moderately severe focal stenosis within an inferior division mid M2 right MCA branch vessel. 5. Moderate stenosis within the V4 right vertebral artery. 6. High-grade stenosis within the P2 left PCA. 7. High-grade stenosis within the right PCA at the P2/P3 junction. 8. Multifocal high-grade stenoses within the A2 anterior cerebral arteries bilaterally. CT perfusion head: The perfusion software identifies an 89 mL core infarct within the left MCA vascular territory. The perfusion software identifies a 149 mL region of critically hypoperfused parenchyma within the left MCA vascular territory. Reported mismatch volume 60 mL. Electronically Signed   By: Jackey Loge DO   On: 02/23/2020 12:53   DG CHEST PORT 1 VIEW  Result Date: 02/24/2020 CLINICAL DATA:  59 year old male with shortness of breath. EXAM: PORTABLE CHEST 1 VIEW COMPARISON:  Chest radiograph dated 02/22/2020. FINDINGS: Status post removal of the endotracheal and enteric tube. There is diffuse interstitial densities which may be chronic or represent edema or atypical pneumonia. No large pleural effusion. No pneumothorax. Stable cardiac  silhouette. No acute osseous pathology.  IMPRESSION: Interval removal of the endotracheal and enteric tubes. Electronically Signed   By: Elgie Collard M.D.   On: 02/24/2020 19:11   Portable Chest x-ray  Result Date: 02/22/2020 CLINICAL DATA:  Left cerebral artery and polyp stroke, postop bilateral iliofemoral thrombectomy, ETT EXAM: PORTABLE CHEST 1 VIEW COMPARISON:  01/12/2013 FINDINGS: Lungs are essentially clear.  No pleural effusion or pneumothorax. The heart is normal in size. Endotracheal tube terminates 5 cm above the carina. Enteric tube terminates in the proximal gastric body. IMPRESSION: Endotracheal tube terminates 5 cm above the carina. Enteric tube terminates in the proximal gastric body. Electronically Signed   By: Charline Bills M.D.   On: 02/22/2020 08:54   DG Swallowing Func-Speech Pathology  Result Date: 02/28/2020 Objective Swallowing Evaluation: Type of Study: MBS-Modified Barium Swallow Study  Patient Details Name: Jonathan [name] MRN: [id number] Date of Birth: 1961/04/19 Today's Date: 02/28/2020 Time: SLP Start Time (ACUTE ONLY): 1457 -SLP Stop Time (ACUTE ONLY): 1513 SLP Time Calculation (min) (ACUTE ONLY): 16 min Past Medical History: Past Medical History: Diagnosis Date . Aphasia S/P CVA  . DDD (degenerative disc disease), cervical  . ED (erectile dysfunction)  . Hyperlipidemia  . Hypertension  . Peripheral arterial disease (HCC)  . Stroke [facility])  Past Surgical History: Past Surgical History: Procedure Laterality Date . FEMORAL-FEMORAL BYPASS GRAFT Bilateral 02/22/2020  Procedure: bilateral ileofemoral thrombectomy, left popliteal exposure and thrombectomy, four compartment fasciotomy, left femoral endarterectomy with patch angioplasty;  Surgeon: Nada Libman, MD;  Location: [facility] ORS;  Service: Vascular;  Laterality: Bilateral; . LOWER EXTREMITY ANGIOGRAPHY Left 02/21/2020  Procedure: LOWER EXTREMITY ANGIOGRAPHY;  Surgeon: Renford Dills, MD;  Location: [facility] INVASIVE CV LAB;  Service: Cardiovascular;  Laterality:  Left; . PERIPHERAL VASCULAR CATHETERIZATION N/A 07/21/2015  Procedure: Abdominal Aortogram w/Lower Extremity;  Surgeon: Renford Dills, MD;  Location: [facility] INVASIVE CV LAB;  Service: Cardiovascular;  Laterality: N/A; . PERIPHERAL VASCULAR CATHETERIZATION  07/21/2015  Procedure: Lower Extremity Intervention;  Surgeon: Renford Dills, MD;  Location: [facility] INVASIVE CV LAB;  Service: Cardiovascular;; HPI: 59 y.o. male with a history of hypertension, hyperlipidemia, CVA, severe PAD  who presented with acute critical limb ischemia secondary to occlusion of the iliac artery who underwent stenting on 8/6 at [facility].  He was taken back to the OR on 8/7 found to have acute thrombosis of bilateral iliac stents underwent thrombectomy and endarterectomy;  fasciotomies of the left leg. Extubated 8/8. Developed right hemiplegia and aphasia morning of 8/8. CTH: large territory infarct left MCA. Pt transferred to [facility] same day.   Subjective: alert, aphasic Assessment / Plan / Recommendation CHL IP CLINICAL IMPRESSIONS 02/28/2020 Clinical Impression Pt's oropharyngeal swallow is stable compared to MBS completed on 8/9 even with pt self-feeding throughout this study (see that note for more specifics regarding physiology). Pt was allowed to self-feed at his own rate, drinking cups of thin barium without stopping. Across multiple small cupfuls, there was no aspiration. Trace penetration occurred x2 with thin liquids but cleared spontaneously as he continued to swallow additional boluses. Would continue Dys 1 (puree) diet and thin liquids with full supervision for safety.  SLP Visit Diagnosis Dysphagia, oropharyngeal phase (R13.12) Attention and concentration deficit following -- Frontal lobe and executive function deficit following -- Impact on safety and function Mild aspiration risk   CHL IP TREATMENT RECOMMENDATION 02/28/2020 Treatment Recommendations Therapy as outlined in treatment plan below   Prognosis 02/28/2020 Prognosis for Safe  Diet Advancement Good Barriers  to Reach Goals Language deficits Barriers/Prognosis Comment -- CHL IP DIET RECOMMENDATION 02/28/2020 SLP Diet Recommendations Dysphagia 1 (Puree) solids;Thin liquid Liquid Administration via Cup;Straw Medication Administration Whole meds with puree Compensations Minimize environmental distractions;Slow rate;Small sips/bites Postural Changes Seated upright at 90 degrees;Remain semi-upright after after feeds/meals (Comment)   CHL IP OTHER RECOMMENDATIONS 02/28/2020 Recommended Consults -- Oral Care Recommendations Oral care BID Other Recommendations Have oral suction available   CHL IP FOLLOW UP RECOMMENDATIONS 02/28/2020 Follow up Recommendations Inpatient Rehab   CHL IP FREQUENCY AND DURATION 02/28/2020 Speech Therapy Frequency (ACUTE ONLY) min 2x/week Treatment Duration 2 weeks      CHL IP ORAL PHASE 02/28/2020 Oral Phase Impaired Oral - Pudding Teaspoon -- Oral - Pudding Cup -- Oral - Honey Teaspoon -- Oral - Honey Cup -- Oral - Nectar Teaspoon -- Oral - Nectar Cup -- Oral - Nectar Straw NT Oral - Thin Teaspoon -- Oral - Thin Cup Right anterior bolus loss;Weak lingual manipulation;Reduced posterior propulsion;Holding of bolus;Right pocketing in lateral sulci;Pocketing in anterior sulcus;Lingual/palatal residue;Piecemeal swallowing;Decreased bolus cohesion Oral - Thin Straw Right anterior bolus loss;Weak lingual manipulation;Reduced posterior propulsion;Holding of bolus;Right pocketing in lateral sulci;Pocketing in anterior sulcus;Lingual/palatal residue;Piecemeal swallowing;Decreased bolus cohesion Oral - Puree Right anterior bolus loss;Weak lingual manipulation;Reduced posterior propulsion;Holding of bolus;Right pocketing in lateral sulci;Pocketing in anterior sulcus;Lingual/palatal residue;Piecemeal swallowing;Decreased bolus cohesion Oral - Mech Soft -- Oral - Regular NT Oral - Multi-Consistency -- Oral - Pill -- Oral Phase - Comment --  CHL IP PHARYNGEAL PHASE 02/28/2020 Pharyngeal  Phase Impaired Pharyngeal- Pudding Teaspoon -- Pharyngeal -- Pharyngeal- Pudding Cup -- Pharyngeal -- Pharyngeal- Honey Teaspoon -- Pharyngeal -- Pharyngeal- Honey Cup -- Pharyngeal -- Pharyngeal- Nectar Teaspoon -- Pharyngeal -- Pharyngeal- Nectar Cup -- Pharyngeal -- Pharyngeal- Nectar Straw NT Pharyngeal -- Pharyngeal- Thin Teaspoon -- Pharyngeal -- Pharyngeal- Thin Cup Delayed swallow initiation-pyriform sinuses;Delayed swallow initiation-vallecula Pharyngeal -- Pharyngeal- Thin Straw Delayed swallow initiation-pyriform sinuses;Delayed swallow initiation-vallecula Pharyngeal -- Pharyngeal- Puree Delayed swallow initiation-pyriform sinuses;Delayed swallow initiation-vallecula Pharyngeal -- Pharyngeal- Mechanical Soft -- Pharyngeal -- Pharyngeal- Regular NT Pharyngeal -- Pharyngeal- Multi-consistency -- Pharyngeal -- Pharyngeal- Pill -- Pharyngeal -- Pharyngeal Comment --  CHL IP CERVICAL ESOPHAGEAL PHASE 02/28/2020 Cervical Esophageal Phase WFL Pudding Teaspoon -- Pudding Cup -- Honey Teaspoon -- Honey Cup -- Nectar Teaspoon -- Nectar Cup -- Nectar Straw -- Thin Teaspoon -- Thin Cup -- Thin Straw -- Puree -- Mechanical Soft -- Regular -- Multi-consistency -- Pill -- Cervical Esophageal Comment -- Mahala Menghini., M.A. CCC-SLP Acute Rehabilitation Services Pager [phone] Office [phone] 02/28/2020, 4:22 PM              DG Swallowing Func-Speech Pathology  Result Date: 02/24/2020 Objective Swallowing Evaluation: Type of Study: MBS-Modified Barium Swallow Study  Patient Details Name: Jonathan [name] MRN: [id number] Date of Birth: 25-May-1961 Today's Date: 02/24/2020 Time: SLP Start Time (ACUTE ONLY): 1310 -SLP Stop Time (ACUTE ONLY): 1340 SLP Time Calculation (min) (ACUTE ONLY): 30 min Past Medical History: Past Medical History: Diagnosis Date . Aphasia S/P CVA  . DDD (degenerative disc disease), cervical  . ED (erectile dysfunction)  . Hyperlipidemia  . Hypertension  . Peripheral arterial disease (HCC)  . Stroke  [facility])  Past Surgical History: Past Surgical History: Procedure Laterality Date . FEMORAL-FEMORAL BYPASS GRAFT Bilateral 02/22/2020  Procedure: bilateral ileofemoral thrombectomy, left popliteal exposure and thrombectomy, four compartment fasciotomy, left femoral endarterectomy with patch angioplasty;  Surgeon: Nada Libman, MD;  Location: [facility] ORS;  Service: Vascular;  Laterality: Bilateral; . LOWER EXTREMITY ANGIOGRAPHY  Left 02/21/2020  Procedure: LOWER EXTREMITY ANGIOGRAPHY;  Surgeon: Renford Dills, MD;  Location: [facility] INVASIVE CV LAB;  Service: Cardiovascular;  Laterality: Left; . PERIPHERAL VASCULAR CATHETERIZATION N/A 07/21/2015  Procedure: Abdominal Aortogram w/Lower Extremity;  Surgeon: Renford Dills, MD;  Location: [facility] INVASIVE CV LAB;  Service: Cardiovascular;  Laterality: N/A; . PERIPHERAL VASCULAR CATHETERIZATION  07/21/2015  Procedure: Lower Extremity Intervention;  Surgeon: Renford Dills, MD;  Location: [facility] INVASIVE CV LAB;  Service: Cardiovascular;; HPI: 60 y.o. male with a history of hypertension, hyperlipidemia, CVA, severe PAD  who presented with acute critical limb ischemia secondary to occlusion of the iliac artery who underwent stenting on 8/6 at [facility].  He was taken back to the OR on 8/7 found to have acute thrombosis of bilateral iliac stents underwent thrombectomy and endarterectomy;  fasciotomies of the left leg. Extubated 8/8. Developed right hemiplegia and aphasia morning of 8/8. CTH: large territory infarct left MCA. Pt transferred to [facility] same day.   Subjective: alert, aphasic Assessment / Plan / Recommendation CHL IP CLINICAL IMPRESSIONS 02/24/2020 Clinical Impression Pt presents with a primary oral dysphagia c/b impaired bolus cohesion and control over release into the pharynx.  There was decreased sensation on the right, with barium residue in right lateral and anterior sulci and limited awareness of its presence.  Pharyngeal function was largely intact, with adequate  pharyngeal squeeze, no residue post-swallow, and effective laryngeal vestibule closure.  Occasionally, thin and nectar thick barium reached the pyriforms for a delayed period of time before the swallow was triggered.  On other occasions, the swallow trigger was timely.  There was one incident of penetration to the vocal folds when a bolus of nectar thick liquid prematurely escaped the oral cavity and entered the laryngeal vestibule without eliciting a response.  Pt's impaired sensation appears to be the primary concern and puts him at greater risk of aspiration.  For now, recommend starting a dysphagia 1 diet, thin liquids from cup only - NO STRAWS; give meds whole in applesauce/pudding.  Due to pt's impulsivity, difficulty following commands, and poor sensation/awareness on right side of mouth/throat, he will require careful supervision during meals.  SLP will follow for safety/diet progression and therapeutic training.   SLP Visit Diagnosis Dysphagia, oropharyngeal phase (R13.12) Attention and concentration deficit following -- Frontal lobe and executive function deficit following -- Impact on safety and function Mild aspiration risk   CHL IP TREATMENT RECOMMENDATION 02/24/2020 Treatment Recommendations Therapy as outlined in treatment plan below   Prognosis 02/24/2020 Prognosis for Safe Diet Advancement Good Barriers to Reach Goals -- Barriers/Prognosis Comment -- CHL IP DIET RECOMMENDATION 02/24/2020 SLP Diet Recommendations Dysphagia 1 (Puree) solids;Thin liquid Liquid Administration via Cup;No straw Medication Administration Whole meds with puree Compensations Minimize environmental distractions;Slow rate;Small sips/bites Postural Changes --   CHL IP OTHER RECOMMENDATIONS 02/24/2020 Recommended Consults -- Oral Care Recommendations Oral care BID Other Recommendations --   CHL IP FOLLOW UP RECOMMENDATIONS 02/24/2020 Follow up Recommendations Inpatient Rehab   CHL IP FREQUENCY AND DURATION 02/24/2020 Speech Therapy  Frequency (ACUTE ONLY) min 3x week Treatment Duration 2 weeks      CHL IP ORAL PHASE 02/24/2020 Oral Phase Impaired Oral - Pudding Teaspoon -- Oral - Pudding Cup -- Oral - Honey Teaspoon -- Oral - Honey Cup -- Oral - Nectar Teaspoon -- Oral - Nectar Cup -- Oral - Nectar Straw Right anterior bolus loss;Weak lingual manipulation;Reduced posterior propulsion;Holding of bolus;Right pocketing in lateral sulci;Pocketing in anterior sulcus;Lingual/palatal residue;Piecemeal swallowing;Decreased bolus cohesion Oral -  Thin Teaspoon -- Oral - Thin Cup Right anterior bolus loss;Weak lingual manipulation;Reduced posterior propulsion;Holding of bolus;Right pocketing in lateral sulci;Pocketing in anterior sulcus;Lingual/palatal residue;Piecemeal swallowing;Decreased bolus cohesion Oral - Thin Straw Right anterior bolus loss;Weak lingual manipulation;Reduced posterior propulsion;Holding of bolus;Right pocketing in lateral sulci;Pocketing in anterior sulcus;Lingual/palatal residue;Piecemeal swallowing;Decreased bolus cohesion Oral - Puree Right anterior bolus loss;Weak lingual manipulation;Reduced posterior propulsion;Holding of bolus;Right pocketing in lateral sulci;Pocketing in anterior sulcus;Lingual/palatal residue;Piecemeal swallowing;Decreased bolus cohesion Oral - Mech Soft -- Oral - Regular Right anterior bolus loss;Weak lingual manipulation;Reduced posterior propulsion;Holding of bolus;Right pocketing in lateral sulci;Pocketing in anterior sulcus;Lingual/palatal residue;Piecemeal swallowing;Decreased bolus cohesion Oral - Multi-Consistency -- Oral - Pill -- Oral Phase - Comment --  CHL IP PHARYNGEAL PHASE 02/24/2020 Pharyngeal Phase Impaired Pharyngeal- Pudding Teaspoon -- Pharyngeal -- Pharyngeal- Pudding Cup -- Pharyngeal -- Pharyngeal- Honey Teaspoon -- Pharyngeal -- Pharyngeal- Honey Cup -- Pharyngeal -- Pharyngeal- Nectar Teaspoon -- Pharyngeal -- Pharyngeal- Nectar Cup -- Pharyngeal -- Pharyngeal- Nectar Straw Delayed  swallow initiation-pyriform sinuses;Delayed swallow initiation-vallecula;Penetration/Aspiration before swallow Pharyngeal Material enters airway, CONTACTS cords and not ejected out Pharyngeal- Thin Teaspoon -- Pharyngeal -- Pharyngeal- Thin Cup Delayed swallow initiation-pyriform sinuses;Delayed swallow initiation-vallecula Pharyngeal -- Pharyngeal- Thin Straw Delayed swallow initiation-vallecula;Delayed swallow initiation-pyriform sinuses Pharyngeal -- Pharyngeal- Puree Delayed swallow initiation-vallecula;Delayed swallow initiation-pyriform sinuses Pharyngeal -- Pharyngeal- Mechanical Soft -- Pharyngeal -- Pharyngeal- Regular Delayed swallow initiation-vallecula Pharyngeal -- Pharyngeal- Multi-consistency -- Pharyngeal -- Pharyngeal- Pill -- Pharyngeal -- Pharyngeal Comment --  CHL IP CERVICAL ESOPHAGEAL PHASE 02/24/2020 Cervical Esophageal Phase WFL Pudding Teaspoon -- Pudding Cup -- Honey Teaspoon -- Honey Cup -- Nectar Teaspoon -- Nectar Cup -- Nectar Straw -- Thin Teaspoon -- Thin Cup -- Thin Straw -- Puree -- Mechanical Soft -- Regular -- Multi-consistency -- Pill -- Cervical Esophageal Comment -- Blenda Mounts Laurice 02/24/2020, 2:58 PM              ECHOCARDIOGRAM COMPLETE  Result Date: 02/24/2020    ECHOCARDIOGRAM REPORT   Patient Name:   Jonathan [name] Date of Exam: 02/24/2020 Medical Rec #:  [id number]          Height:       67.0 in Accession #:    [id number]         Weight:       175.5 lb Date of Birth:  1960/08/03          BSA:          1.913 m Patient Age:    59 years           BP:           159/96 mmHg Patient Gender: M                  HR:           106 bpm. Exam Location:  Inpatient Procedure: 2D Echo and Intracardiac Opacification Agent Indications:    Stroke 434.91 / I163.9  History:        Patient has no prior history of Echocardiogram examinations.                 Peripheral arterial disease, underwent stenting, complicated by                 thrombosis requiring thrombectomy and LLE  fasciotomy. Developed                 left facial droop. 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  Sonographer:    Leta Jungling RDCS Referring Phys:  [id number] Jonathan [name] IMPRESSIONS  1. Left ventricular ejection fraction, by estimation, is 60 to 65%. Left ventricular ejection fraction by 2D MOD biplane is 63.2 %. The left ventricle has normal function. The left ventricle has no regional wall motion abnormalities. Left ventricular diastolic parameters are indeterminate.  2. Right ventricular systolic function is normal. The right ventricular size is normal. There is normal pulmonary artery systolic pressure. The estimated right ventricular systolic pressure is 30.9 mmHg.  3. The mitral valve is grossly normal. No evidence of mitral valve regurgitation. No evidence of mitral stenosis.  4. The aortic valve is tricuspid. Aortic valve regurgitation is not visualized. No aortic stenosis is present.  5. The inferior vena cava is normal in size with greater than 50% respiratory variability, suggesting right atrial pressure of 3 mmHg. Conclusion(s)/Recommendation(s): No intracardiac source of embolism detected on this transthoracic study. A transesophageal echocardiogram is recommended to exclude cardiac source of embolism if clinically indicated. FINDINGS  Left Ventricle: Left ventricular ejection fraction, by estimation, is 60 to 65%. Left ventricular ejection fraction by 2D MOD biplane is 63.2 %. The left ventricle has normal function. The left ventricle has no regional wall motion abnormalities. Definity contrast agent was given IV to delineate the left ventricular endocardial borders. The left ventricular internal cavity size was normal in size. There is no left ventricular hypertrophy. Left ventricular diastolic parameters are indeterminate. Right Ventricle: The right ventricular size is normal. No increase in right ventricular wall thickness. Right ventricular systolic function is normal. There is normal pulmonary artery systolic  pressure. The tricuspid regurgitant velocity is 2.64 m/s, and  with an assumed right atrial pressure of 3 mmHg, the estimated right ventricular systolic pressure is 30.9 mmHg. Left Atrium: Left atrial size was normal in size. Right Atrium: Right atrial size was normal in size. Pericardium: Trivial pericardial effusion is present. Presence of pericardial fat pad. Mitral Valve: The mitral valve is grossly normal. No evidence of mitral valve regurgitation. No evidence of mitral valve stenosis. Tricuspid Valve: The tricuspid valve is grossly normal. Tricuspid valve regurgitation is trivial. No evidence of tricuspid stenosis. Aortic Valve: The aortic valve is tricuspid. Aortic valve regurgitation is not visualized. No aortic stenosis is present. Pulmonic Valve: The pulmonic valve was grossly normal. Pulmonic valve regurgitation is not visualized. No evidence of pulmonic stenosis. Aorta: The aortic root is normal in size and structure. Venous: The inferior vena cava is normal in size with greater than 50% respiratory variability, suggesting right atrial pressure of 3 mmHg. IAS/Shunts: The atrial septum is grossly normal.  LEFT VENTRICLE PLAX 2D                        Biplane EF (MOD) LVIDd:         4.90 cm         LV Biplane EF:   Left LVIDs:         3.60 cm                          ventricular LV PW:         0.90 cm                          ejection LV IVS:        1.10 cm  fraction by LVOT diam:     1.90 cm                          2D MOD LV SV:         43                               biplane is LV SV Index:   22                               63.2 %. LVOT Area:     2.84 cm                                Diastology                                LV e' lateral:   8.93 cm/s LV Volumes (MOD)               LV E/e' lateral: 11.4 LV vol d, MOD    98.1 ml       LV e' medial:    6.08 cm/s A2C:                           LV E/e' medial:  16.8 LV vol d, MOD    88.1 ml A4C: LV vol s, MOD    53.3 ml A2C: LV  vol s, MOD    23.4 ml A4C: LV SV MOD A2C:   44.8 ml LV SV MOD A4C:   88.1 ml LV SV MOD BP:    60.6 ml RIGHT VENTRICLE RV S prime:     11.40 cm/s TAPSE (M-mode): 1.9 cm LEFT ATRIUM             Index       RIGHT ATRIUM           Index LA diam:        3.90 cm 2.04 cm/m  RA Area:     13.40 cm LA Vol (A2C):   50.2 ml 26.24 ml/m RA Volume:   34.30 ml  17.93 ml/m LA Vol (A4C):   63.9 ml 33.40 ml/m LA Biplane Vol: 58.4 ml 30.53 ml/m  AORTIC VALVE LVOT Vmax:   88.30 cm/s LVOT Vmean:  53.100 cm/s LVOT VTI:    0.151 m  AORTA Ao Root diam: 3.30 cm MITRAL VALVE                TRICUSPID VALVE MV Area (PHT): 7.16 cm     TR Peak grad:   27.9 mmHg MV Decel Time: 106 msec     TR Vmax:        264.00 cm/s MV E velocity: 102.00 cm/s MV A velocity: 49.20 cm/s   SHUNTS MV E/A ratio:  2.07         Systemic VTI:  0.15 m                             Systemic Diam: 1.90 cm Lennie Odor MD Electronically signed by Lennie Odor MD Signature Date/Time: 02/24/2020/1:09:12 PM    Final    CT  HEAD CODE STROKE WO CONTRAST  Result Date: 02/23/2020 CLINICAL DATA:  Code stroke. Neuro deficit, acute, stroke suspected. Mental status change at 11 a.m., last known well 8 a.m. this morning. EXAM: CT HEAD WITHOUT CONTRAST TECHNIQUE: Contiguous axial images were obtained from the base of the skull through the vertex without intravenous contrast. COMPARISON:  Brain MRI 09/17/2018 FINDINGS: Brain: There is a large acute demarcated ischemic infarct within the left MCA vascular territory affecting the left frontal, temporal and parietal lobes as well as left insula and subinsular region. The left caudate and lentiform nuclei are also affected. No evidence of hemorrhagic conversion. There is mild associated mass effect with 3 mm rightward midline shift. Redemonstrated superimposed small chronic infarcts within the left cerebral hemisphere. Background mild generalized parenchymal atrophy and chronic small vessel ischemic disease. No extra-axial fluid  collection. No evidence of intracranial mass. Vascular: No hyperdense vessel Skull: Atherosclerotic calcifications. Sinuses/Orbits: Visualized orbits show no acute finding. Mild ethmoid sinus mucosal thickening. No significant mastoid effusion. ASPECTS [facility] Stroke Program Early CT Score) - Ganglionic level infarction (caudate, lentiform nuclei, internal capsule, insula, M1-M3 cortex): 2 - Supraganglionic infarction (M4-M6 cortex): 2 Total score (0-10 with 10 being normal): 4 These results were called by telephone at the time of interpretation on 02/23/2020 at 12:20 pm to provider Erick Blinks, who verbally acknowledged these results. IMPRESSION: Large acute demarcated left MCA vascular territory as described. ASPECTS is 4. No evidence of hemorrhagic conversion. Mild mass effect with 3 mm rightward midline shift. Stable background mild generalized parenchymal atrophy and chronic small vessel ischemic disease. Superimposed small chronic infarcts within the left cerebral hemisphere. Electronically Signed   By: Jackey Loge DO   On: 02/23/2020 12:22   CT ANGIO HEAD CODE STROKE  Result Date: 02/23/2020 CLINICAL DATA:  Neuro deficit, acute, stroke suspected. EXAM: CT ANGIOGRAPHY HEAD AND NECK CT PERFUSION BRAIN TECHNIQUE: Multidetector CT imaging of the head and neck was performed using the standard protocol during bolus administration of intravenous contrast. Multiplanar CT image reconstructions and MIPs were obtained to evaluate the vascular anatomy. Carotid stenosis measurements (when applicable) are obtained utilizing NASCET criteria, using the distal internal carotid diameter as the denominator. Multiphase CT imaging of the brain was performed following IV bolus contrast injection. Subsequent parametric perfusion maps were calculated using RAPID software. CONTRAST:  OMNIPAQUE IOHEXOL 350 MG/ML SOLN COMPARISON:  Noncontrast head CT performed earlier the same day. FINDINGS: CTA NECK FINDINGS Aortic  arch: The left vertebral artery arises directly from the aortic arch. Atherosclerotic plaque within the visualized aortic arch and proximal major branch vessels of the neck. No hemodynamically significant innominate or proximal subclavian artery stenosis. Right carotid system: The CCA and ICA are patent within the neck. Atherosclerotic plaque within these vessels. Most notably there is at least moderate calcified plaque within the carotid bifurcation. Exact quantification of stenosis within the proximal right ICA is difficult due to motion artifact and blooming artifact of calcified plaque at this site. Additionally, there is 50-60% stenosis of the mid cervical ICA. Left carotid system: The CCA is patent to the bifurcation. 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 Vertebral arteries: The dominant right vertebral artery is patent throughout the neck. Moderate/severe atherosclerotic narrowing within the V1 right vertebral artery. The non dominant left vertebral artery is developmentally diminutive, but patent throughout the neck Skeleton: Reversal of the expected cervical lordosis. Cervical spondylosis with multilevel disc space narrowing, posterior disc osteophytes and uncovertebral hypertrophy.  Other neck: No neck mass or cervical lymphadenopathy. Upper chest: No consolidation within the imaged lung apices. 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 There is minimal reconstitution of flow within the supraclinoid left ICA. The M1 and M2 left middle cerebral arteries are patent, although with asymmetrically decreased opacification as compared to the right side. Additionally, there is a moderate to moderately severe focal stenosis within a superior division  proximal M2 left MCA branch (series 8, image 17). The intracranial right internal carotid artery is patent. However, there are sites of severe stenosis within the cavernous and paraclinoid segments. The M1 right middle cerebral artery is patent without significant stenosis. No right M2 proximal branch occlusion is identified. There is a moderate to moderately severe stenosis within an inferior division mid M2 right MCA branch vessel (series 9, image 19). The A1 left anterior cerebral artery is hypoplastic. The A1 right anterior cerebral artery is dominant. The A2 and more distal anterior cerebral arteries are patent. However, there are multifocal high-grade stenoses within the bilateral A2 anterior cerebral arteries. Posterior circulation: The non dominant intracranial left vertebral artery is developmentally diminutive, but patent and terminates as the left PICA. The dominant intracranial right vertebral artery is patent, although with sites of moderate stenosis. The basilar artery is patent. The posterior cerebral arteries are patent. However, there is a high-grade focal stenosis within the P2 left PCA and high-grade focal stenosis within the right PCA at the P2/P3 junction. Venous sinuses: Within limitations of contrast timing, no convincing thrombus. Anatomic variants: Posterior communicating arteries are hypoplastic or absent bilaterally. Review of the MIP images confirms the above findings CT Brain Perfusion Findings: ASPECTS: 4 CBF (<30%) Volume: 89mL (left MCA vascular territory) Perfusion (Tmax>6.0s) volume: (left MCA vascular territory) Mismatch Volume: 60mL Infarction Location:Left MCA vascular territory. These results were called by telephone at the time of interpretation on 02/23/2020 at 12:40 pm to provider [facility] , who verbally acknowledged these results. IMPRESSION: CTA neck: 1. 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  2. There is at least moderate calcified plaque at the right carotid bifurcation. There is stenosis at the origin of the cervical right vertebral artery which is poorly quantified due to motion degradation and blooming artifact from calcified plaque at this site. Carotid artery duplex is recommended for further evaluation. Additionally, there is apparent 50-60% stenosis of the mid cervical right ICA. 3. Moderate/severe atherosclerotic narrowing within the Dom V1 right vertebral artery. 4. The left vertebral artery is developmentally diminutive, but patent throughout the neck. CTA head: 1. The left internal carotid artery remains occluded intracranially throughout the siphon region. There is minimal reconstitution of flow within the supraclinoid left ICA. The M1 and M2 left middle cerebral arteries are patent, although with asymmetrically decreased opacification as compared to the right side. Additionally, there is a moderate to moderately severe focal stenosis within a superior division proximal M2 left MCA branch. 2. Additional intracranial atherosclerotic disease with multifocal stenoses, most notably as follows. 3. High-grade stenoses within the cavernous/paraclinoid right ICA. 4. Moderate to moderately severe focal stenosis within an inferior division mid M2 right MCA branch vessel. 5. Moderate stenosis within the V4 right vertebral artery. 6. High-grade stenosis within the P2 left PCA. 7. High-grade stenosis within the  right PCA at the P2/P3 junction. 8. Multifocal high-grade stenoses within the A2 anterior cerebral arteries bilaterally. CT perfusion head: The perfusion software identifies an 89 mL core infarct within the left MCA vascular territory. The perfusion software identifies a 149 mL region of critically hypoperfused parenchyma within the left MCA vascular territory. Reported mismatch volume 60 mL. Electronically Signed   By: Jackey Loge DO   On: 02/23/2020 12:53   CT ANGIO NECK CODE STROKE  Result  Date: 02/23/2020 CLINICAL DATA:  Neuro deficit, acute, stroke suspected. EXAM: CT ANGIOGRAPHY HEAD AND NECK CT PERFUSION BRAIN TECHNIQUE: Multidetector CT imaging of the head and neck was performed using the standard protocol during bolus administration of intravenous contrast. Multiplanar CT image reconstructions and MIPs were obtained to evaluate the vascular anatomy. Carotid stenosis measurements (when applicable) are obtained utilizing NASCET criteria, using the distal internal carotid diameter as the denominator. Multiphase CT imaging of the brain was performed following IV bolus contrast injection. Subsequent parametric perfusion maps were calculated using RAPID software. CONTRAST:  OMNIPAQUE IOHEXOL 350 MG/ML SOLN COMPARISON:  Noncontrast head CT performed earlier the same day. FINDINGS: CTA NECK FINDINGS Aortic arch: The left vertebral artery arises directly from the aortic arch. Atherosclerotic plaque within the visualized aortic arch and proximal major branch vessels of the neck. No hemodynamically significant innominate or proximal subclavian artery stenosis. Right carotid system: The CCA and ICA are patent within the neck. Atherosclerotic plaque within these vessels. Most notably there is at least moderate calcified plaque within the carotid bifurcation. Exact quantification of stenosis within the proximal right ICA is difficult due to motion artifact and blooming artifact of calcified plaque at this site. Additionally, there is 50-60% stenosis of the mid cervical ICA. Left carotid system: The CCA is patent to the bifurcation. 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 Vertebral arteries: The dominant right vertebral artery is patent throughout the neck. Moderate/severe atherosclerotic narrowing within the V1 right vertebral artery. The non dominant left vertebral artery is developmentally diminutive, but patent throughout the neck Skeleton: Reversal of  the expected cervical lordosis. Cervical spondylosis with multilevel disc space narrowing, posterior disc osteophytes and uncovertebral hypertrophy. Other neck: No neck mass or cervical lymphadenopathy. Upper chest: No consolidation within the imaged lung apices. 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 There is minimal reconstitution of flow within the supraclinoid left ICA. The M1 and M2 left middle cerebral arteries are patent, although with asymmetrically decreased opacification as compared to the right side. Additionally, there is a moderate to moderately severe focal stenosis within a superior division proximal M2 left MCA branch (series 8, image 17). The intracranial right internal carotid artery is patent. However, there are sites of severe stenosis within the cavernous and paraclinoid segments. The M1 right middle cerebral artery is patent without significant stenosis. No right M2 proximal branch occlusion is identified. There is a moderate to moderately severe stenosis within an inferior division mid M2 right MCA branch vessel (series 9, image 19). The A1 left anterior cerebral artery is hypoplastic. The A1 right anterior cerebral artery is dominant. The A2 and more distal anterior cerebral arteries are patent. However, there are multifocal high-grade stenoses within the bilateral A2 anterior cerebral arteries. Posterior circulation: The non dominant intracranial left vertebral artery  is developmentally diminutive, but patent and terminates as the left PICA. The dominant intracranial right vertebral artery is patent, although with sites of moderate stenosis. The basilar artery is patent. The posterior cerebral arteries are patent. However, there is a high-grade focal stenosis within the P2 left PCA  and high-grade focal stenosis within the right PCA at the P2/P3 junction. Venous sinuses: Within limitations of contrast timing, no convincing thrombus. Anatomic variants: Posterior communicating arteries are hypoplastic or absent bilaterally. Review of the MIP images confirms the above findings CT Brain Perfusion Findings: ASPECTS: 4 CBF (<30%) Volume: 89mL (left MCA vascular territory) Perfusion (Tmax>6.0s) volume: (left MCA vascular territory) Mismatch Volume: 60mL Infarction Location:Left MCA vascular territory. These results were called by telephone at the time of interpretation on 02/23/2020 at 12:40 pm to provider [facility] , who verbally acknowledged these results. IMPRESSION: CTA neck: 1. 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 2. There is at least moderate calcified plaque at the right carotid bifurcation. There is stenosis at the origin of the cervical right vertebral artery which is poorly quantified due to motion degradation and blooming artifact from calcified plaque at this site. Carotid artery duplex is recommended for further evaluation. Additionally, there is apparent 50-60% stenosis of the mid cervical right ICA. 3. Moderate/severe atherosclerotic narrowing within the Dom V1 right vertebral artery. 4. The left vertebral artery is developmentally diminutive, but patent throughout the neck. CTA head: 1. The left internal carotid artery remains occluded intracranially throughout the siphon region. There is minimal reconstitution of flow within the supraclinoid left ICA. The M1 and M2 left middle cerebral arteries are patent, although with asymmetrically decreased opacification as compared to the right side. Additionally, there is a moderate to moderately severe focal stenosis within a superior division proximal M2 left MCA branch. 2. Additional intracranial atherosclerotic disease with multifocal stenoses, most notably  as follows. 3. High-grade stenoses within the cavernous/paraclinoid right ICA. 4. Moderate to moderately severe focal stenosis within an inferior division mid M2 right MCA branch vessel. 5. Moderate stenosis within the V4 right vertebral artery. 6. High-grade stenosis within the P2 left PCA. 7. High-grade stenosis within the right PCA at the P2/P3 junction. 8. Multifocal high-grade stenoses within the A2 anterior cerebral arteries bilaterally. CT perfusion head: The perfusion software identifies an 89 mL core infarct within the left MCA vascular territory. The perfusion software identifies a 149 mL region of critically hypoperfused parenchyma within the left MCA vascular territory. Reported mismatch volume 60 mL. Electronically Signed   By: Jackey Loge DO   On: 02/23/2020 12:53     Time Spent in minutes  30     Laverna Peace M.D on 03/10/2020 at 2:30 PM  To page go to [email or web address] - password [facility]

## 2020-03-10 NOTE — Progress Notes (Signed)
Report given to nurse Olegario Messier at [facility]. All belongings sent.. VSS. Transportation provided [employer].

## 2020-03-10 NOTE — Care Plan (Signed)
Patient is medically stable for discharge today.  No change from DC summary written on 8/23.   Laverna Peace [facility]

## 2020-03-16 ENCOUNTER — Encounter: Payer: Self-pay | Admitting: Surgery

## 2020-03-16 ENCOUNTER — Other Ambulatory Visit: Payer: Self-pay

## 2020-03-16 ENCOUNTER — Ambulatory Visit (INDEPENDENT_AMBULATORY_CARE_PROVIDER_SITE_OTHER): Payer: BC Managed Care – PPO | Admitting: Surgery

## 2020-03-16 VITALS — BP 103/59 | HR 60 | Temp 98.0°F | Resp 20

## 2020-03-16 DIAGNOSIS — I739 Peripheral vascular disease, unspecified: Secondary | ICD-10-CM

## 2020-03-16 NOTE — Progress Notes (Signed)
Patient name: Jonathan [name] MRN: [id number] DOB: 10-24-1960 Sex: male  REASON FOR VISIT:    Postop  HISTORY OF PRESENT ILLNESS:   Jonathan [name] is a 59 y.o. male who presented on 02/21/2020 with ischemic changes to his left leg.  He has a history of a left external iliac stent in 2017 at another facility.  The patient had not been taking his medication and continues to smoke.  He was taken to the Cath Lab where he underwent recanalization of his occluded left iliac stent and subsequent placement of bilateral iliac stents.  Later that night he developed an acutely ischemic leg on the left, and had signs of right leg ischemia as well.  He was taken to the operating room and underwent bilateral open thrombectomy.  A fasciotomy was performed on the left leg.  Approximately 1 day later he developed confusion, dysarthria and right-sided paresis.  CT scan and MRI showed a left brain stroke with left carotid occlusion.  Because of the size of the stroke he was transferred to [facility] in case he were to require a decompressive craniotomy due to swelling from his large stroke.  Fortunately he never required this.  Unfortunately, he has a persistent dense right hemiparesis and dysarthria.  He is now on Eliquis and Plavix as well as high-dose statin therapy.  CURRENT MEDICATIONS:    Current Outpatient Medications  Medication Sig Dispense Refill   albuterol (PROVENTIL) (2.5 MG/3ML) 0.083% nebulizer solution Inhale 3 mLs into the lungs every 6 (six) hours as needed for wheezing or shortness of breath. 75 mL 12   amLODipine (NORVASC) 10 MG tablet Take 1 tablet (10 mg total) by mouth daily.     apixaban (ELIQUIS) 5 MG TABS tablet Take 1 tablet (5 mg total) by mouth 2 (two) times daily. 60 tablet    atorvastatin (LIPITOR) 80 MG tablet Take 1 tablet (80 mg total) by mouth daily.     chlorhexidine (PERIDEX) 0.12 % solution 15 mLs by Mouth Rinse route 2 (two) times  daily. 120 mL 0   clopidogrel (PLAVIX) 75 MG tablet Take 1 tablet (75 mg total) by mouth daily.     docusate sodium (COLACE) 100 MG capsule Take 1 capsule (100 mg total) by mouth 2 (two) times daily as needed for mild constipation. 10 capsule 0   feeding supplement, ENSURE ENLIVE, (ENSURE ENLIVE) LIQD Take 237 mLs by mouth 2 (two) times daily between meals. 237 mL 12   folic acid (FOLVITE) 1 MG tablet Take 1 tablet (1 mg total) by mouth daily.     guaiFENesin (MUCINEX) 600 MG 12 hr tablet Take 1 tablet (600 mg total) by mouth 2 (two) times daily.     lisinopril (ZESTRIL) 40 MG tablet Take 1 tablet (40 mg total) by mouth daily.     Metoprolol Tartrate 75 MG TABS Take 75 mg by mouth 2 (two) times daily.     Multiple Vitamin (MULTIVITAMIN WITH MINERALS) TABS tablet Take 1 tablet by mouth daily.     nicotine (NICODERM CQ - DOSED IN MG/24 HOURS) 21 mg/24hr patch Place 1 patch (21 mg total) onto the skin daily at 8 pm. 28 patch 0   pantoprazole (PROTONIX) 40 MG tablet Take 1 tablet (40 mg total) by mouth daily at 8 pm.     polyethylene glycol (MIRALAX / GLYCOLAX) 17 g packet Take 17 g by mouth daily as needed for moderate constipation. 14 each 0   No current facility-administered medications for  this visit.    REVIEW OF SYSTEMS:   [X]  denotes positive finding, [ ]  denotes negative finding Cardiac  Comments:  Chest pain or chest pressure:    Shortness of breath upon exertion:    Short of breath when lying flat:    Irregular heart rhythm:    Constitutional    Fever or chills:      PHYSICAL EXAM:   Vitals:   03/16/20 1102  BP: (!) 103/59  Pulse: 60  Resp: 20  Temp: 98 F (36.7 C)  SpO2: 94%    GENERAL: The patient is a well-nourished male, in no acute distress. The vital signs are documented above. CARDIOVASCULAR: There is a regular rate and rhythm. PULMONARY: Non-labored respirations Palpable pedal pulses. Fasciotomy incision is closed.  STUDIES:   None   MEDICAL  ISSUES:   will be removed from the fasciotomy incision today and Steri-Strips will be placed.  Continue Eliquis and Plavix.  Follow-up 6 months with aortoiliac duplex.  03/18/20, MD, FACS [facility] [phone] Pager [phone]

## 2020-03-17 ENCOUNTER — Other Ambulatory Visit: Payer: Self-pay | Admitting: *Deleted

## 2020-03-17 DIAGNOSIS — I739 Peripheral vascular disease, unspecified: Secondary | ICD-10-CM

## 2020-03-30 ENCOUNTER — Ambulatory Visit (INDEPENDENT_AMBULATORY_CARE_PROVIDER_SITE_OTHER): Payer: BC Managed Care – PPO

## 2020-03-30 ENCOUNTER — Other Ambulatory Visit (INDEPENDENT_AMBULATORY_CARE_PROVIDER_SITE_OTHER): Payer: Self-pay | Admitting: Vascular Surgery

## 2020-03-30 ENCOUNTER — Other Ambulatory Visit: Payer: Self-pay

## 2020-03-30 ENCOUNTER — Encounter (INDEPENDENT_AMBULATORY_CARE_PROVIDER_SITE_OTHER): Payer: Self-pay | Admitting: Vascular Surgery

## 2020-03-30 ENCOUNTER — Ambulatory Visit (INDEPENDENT_AMBULATORY_CARE_PROVIDER_SITE_OTHER): Payer: BC Managed Care – PPO | Admitting: Vascular Surgery

## 2020-03-30 VITALS — BP 117/76 | HR 74

## 2020-03-30 DIAGNOSIS — Z95820 Peripheral vascular angioplasty status with implants and grafts: Secondary | ICD-10-CM

## 2020-03-30 DIAGNOSIS — I70222 Atherosclerosis of native arteries of extremities with rest pain, left leg: Secondary | ICD-10-CM

## 2020-03-30 DIAGNOSIS — I63132 Cerebral infarction due to embolism of left carotid artery: Secondary | ICD-10-CM

## 2020-03-30 DIAGNOSIS — I739 Peripheral vascular disease, unspecified: Secondary | ICD-10-CM

## 2020-03-30 NOTE — Progress Notes (Signed)
Patient ID: Jonathan [name], male   DOB: February 05, 1961, 59 y.o.   MRN: [id number]  No chief complaint on file.   HPI Jonathan [name] is a 59 y.o. male.    Patient is status post bilateral common and external iliac artery stenting for acute ischemia of the lower extremities on February 21, 2020.  Subsequently on August 7 he was returned to the operating room for left common femoral endarterectomy and thrombectomy.  His postoperative course was complicated with a left hemispheric stroke that has left him with aphasia and right-sided weakness.  He is seen today in a wheelchair.   Past Medical History:  Diagnosis Date  . Aphasia S/P CVA   . DDD (degenerative disc disease), cervical   . ED (erectile dysfunction)   . Hyperlipidemia   . Hypertension   . Peripheral arterial disease (HCC)   . Stroke Jonathan [name])     Past Surgical History:  Procedure Laterality Date  . FEMORAL-FEMORAL BYPASS GRAFT Bilateral 02/22/2020   Procedure: bilateral ileofemoral thrombectomy, left popliteal exposure and thrombectomy, four compartment fasciotomy, left femoral endarterectomy with patch angioplasty;  Surgeon: Nada Libman, MD;  Location: [facility] ORS;  Service: Vascular;  Laterality: Bilateral;  . LOWER EXTREMITY ANGIOGRAPHY Left 02/21/2020   Procedure: LOWER EXTREMITY ANGIOGRAPHY;  Surgeon: Renford Dills, MD;  Location: [facility] INVASIVE CV LAB;  Service: Cardiovascular;  Laterality: Left;  . PERIPHERAL VASCULAR CATHETERIZATION N/A 07/21/2015   Procedure: Abdominal Aortogram w/Lower Extremity;  Surgeon: Renford Dills, MD;  Location: [facility] INVASIVE CV LAB;  Service: Cardiovascular;  Laterality: N/A;  . PERIPHERAL VASCULAR CATHETERIZATION  07/21/2015   Procedure: Lower Extremity Intervention;  Surgeon: Renford Dills, MD;  Location: [facility] INVASIVE CV LAB;  Service: Cardiovascular;;      No Known Allergies  Current Outpatient Medications  Medication Sig Dispense Refill  . albuterol (PROVENTIL) (2.5 MG/3ML)  0.083% nebulizer solution Inhale 3 mLs into the lungs every 6 (six) hours as needed for wheezing or shortness of breath. 75 mL 12  . amLODipine (NORVASC) 10 MG tablet Take 1 tablet (10 mg total) by mouth daily.    [name] apixaban (ELIQUIS) 5 MG TABS tablet Take 1 tablet (5 mg total) by mouth 2 (two) times daily. 60 tablet   . atorvastatin (LIPITOR) 80 MG tablet Take 1 tablet (80 mg total) by mouth daily.    . chlorhexidine (PERIDEX) 0.12 % solution 15 mLs by Mouth Rinse route 2 (two) times daily. 120 mL 0  . clopidogrel (PLAVIX) 75 MG tablet Take 1 tablet (75 mg total) by mouth daily.    [name] docusate sodium (COLACE) 100 MG capsule Take 1 capsule (100 mg total) by mouth 2 (two) times daily as needed for mild constipation. 10 capsule 0  . feeding supplement, ENSURE ENLIVE, (ENSURE ENLIVE) LIQD Take 237 mLs by mouth 2 (two) times daily between meals. 237 mL 12  . folic acid (FOLVITE) 1 MG tablet Take 1 tablet (1 mg total) by mouth daily.    [name] guaiFENesin (MUCINEX) 600 MG 12 hr tablet Take 1 tablet (600 mg total) by mouth 2 (two) times daily.    [name] lisinopril (ZESTRIL) 40 MG tablet Take 1 tablet (40 mg total) by mouth daily.    . Metoprolol Tartrate 75 MG TABS Take 75 mg by mouth 2 (two) times daily.    . Multiple Vitamin (MULTIVITAMIN WITH MINERALS) TABS tablet Take 1 tablet by mouth daily.    . nicotine (NICODERM CQ - DOSED IN MG/24 HOURS)  21 mg/24hr patch Place 1 patch (21 mg total) onto the skin daily at 8 pm. 28 patch 0  . pantoprazole (PROTONIX) 40 MG tablet Take 1 tablet (40 mg total) by mouth daily at 8 pm.    . polyethylene glycol (MIRALAX / GLYCOLAX) 17 g packet Take 17 g by mouth daily as needed for moderate constipation. 14 each 0   No current facility-administered medications for this visit.        Physical Exam There were no vitals taken for this visit. Gen:  WD/WN, NAD Skin: incision C/D/I left groin Neuro: Right arm and right leg are paralyzed he also is  aphasic    Assessment/Plan: 1. PAD (peripheral artery disease) (HCC) Recommend:  The patient is status post successful angiogram with intervention.  The patient reports that the claudication symptoms and leg pain is essentially gone.   The patient denies lifestyle limiting changes at this point in time.  No further invasive studies, angiography or surgery at this time The patient should continue walking and begin a more formal exercise program.  The patient should continue antiplatelet therapy and aggressive treatment of the lipid abnormalities  The patient should continue wearing graduated compression socks 10-15 mmHg strength to control the mild edema.  Patient should undergo noninvasive studies as ordered. The patient will follow up with me after the studies.   - VAS [place] LOWER EXTREMITY ARTERIAL DUPLEX; Future - VAS [place] ABI WITH/WO TBI; Future  2. 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  Patient will continue rehab as well       Levora Dredge 03/30/2020, 8:42 AM   This note was created with Dragon medical transcription system.  Any errors from dictation are unintentional.

## 2020-03-31 ENCOUNTER — Encounter (INDEPENDENT_AMBULATORY_CARE_PROVIDER_SITE_OTHER): Payer: Self-pay | Admitting: Vascular Surgery

## 2020-04-15 ENCOUNTER — Other Ambulatory Visit: Payer: Self-pay | Admitting: Neurology

## 2020-04-15 DIAGNOSIS — M79601 Pain in right arm: Secondary | ICD-10-CM

## 2020-04-22 ENCOUNTER — Ambulatory Visit
Admission: RE | Admit: 2020-04-22 | Discharge: 2020-04-22 | Disposition: A | Discharge status: Home / Self Care | Payer: BC Managed Care – PPO | Source: Ambulatory Visit | Attending: Neurology | Admitting: Neurology

## 2020-04-22 ENCOUNTER — Other Ambulatory Visit: Payer: Self-pay

## 2020-04-22 DIAGNOSIS — M79601 Pain in right arm: Secondary | ICD-10-CM | POA: Insufficient documentation

## 2020-06-01 ENCOUNTER — Encounter (INDEPENDENT_AMBULATORY_CARE_PROVIDER_SITE_OTHER): Payer: BC Managed Care – PPO

## 2020-06-01 ENCOUNTER — Ambulatory Visit (INDEPENDENT_AMBULATORY_CARE_PROVIDER_SITE_OTHER): Payer: BC Managed Care – PPO | Admitting: Vascular Surgery

## 2020-06-01 ENCOUNTER — Emergency Department: Payer: BC Managed Care – PPO

## 2020-06-01 ENCOUNTER — Emergency Department
Admission: EM | Admit: 2020-06-01 | Discharge: 2020-06-01 | Disposition: A | Discharge status: Home / Self Care | Payer: BC Managed Care – PPO | Attending: Emergency Medicine | Admitting: Emergency Medicine

## 2020-06-01 DIAGNOSIS — M25511 Pain in right shoulder: Secondary | ICD-10-CM | POA: Insufficient documentation

## 2020-06-01 DIAGNOSIS — Z7902 Long term (current) use of antithrombotics/antiplatelets: Secondary | ICD-10-CM | POA: Insufficient documentation

## 2020-06-01 DIAGNOSIS — M7989 Other specified soft tissue disorders: Secondary | ICD-10-CM

## 2020-06-01 DIAGNOSIS — I739 Peripheral vascular disease, unspecified: Secondary | ICD-10-CM | POA: Diagnosis not present

## 2020-06-01 DIAGNOSIS — M79604 Pain in right leg: Secondary | ICD-10-CM

## 2020-06-01 DIAGNOSIS — R2232 Localized swelling, mass and lump, left upper limb: Secondary | ICD-10-CM | POA: Diagnosis not present

## 2020-06-01 DIAGNOSIS — G8929 Other chronic pain: Secondary | ICD-10-CM

## 2020-06-01 DIAGNOSIS — M79641 Pain in right hand: Secondary | ICD-10-CM | POA: Diagnosis present

## 2020-06-01 DIAGNOSIS — F17210 Nicotine dependence, cigarettes, uncomplicated: Secondary | ICD-10-CM | POA: Diagnosis not present

## 2020-06-01 DIAGNOSIS — Z7901 Long term (current) use of anticoagulants: Secondary | ICD-10-CM | POA: Insufficient documentation

## 2020-06-01 DIAGNOSIS — I10 Essential (primary) hypertension: Secondary | ICD-10-CM | POA: Insufficient documentation

## 2020-06-01 DIAGNOSIS — I4891 Unspecified atrial fibrillation: Secondary | ICD-10-CM | POA: Diagnosis not present

## 2020-06-01 DIAGNOSIS — Z79899 Other long term (current) drug therapy: Secondary | ICD-10-CM | POA: Diagnosis not present

## 2020-06-01 NOTE — ED Notes (Signed)
[facility]  CALLED  FOR  TRANSPORT  HOME 

## 2020-06-01 NOTE — ED Notes (Signed)
Pt to ED via recommendation from physical therapist who came to see him in the home. Pt had a CVA 01/2019 with Rt sided deficits that remain. Pt went to rehab after that event and then came home. PT came to the home today to work with pt, and noticed a deformity to Rt shoulder. Pt has had no fall or recent injury. When attempting to assess Rt shoulder, pt with c/o pain to Rt hand. Asked pt to point to pain, pt points to Rt hand. No bruises noted.

## 2020-06-01 NOTE — ED Notes (Signed)
Patient transported to Ultrasound with tech at this time.

## 2020-06-01 NOTE — ED Provider Notes (Addendum)
[facility] Emergency Department Provider Note ____________________________________________   First MD Initiated Contact with Patient 06/01/20 1220     (approximate)  I have reviewed the triage vital signs and the nursing notes.   HISTORY  Chief Complaint Hand Pain (Right)  Level 5 caveat: History present illness limited due to aphasia  HPI Jonathan [name] is a 59 y.o. male with PMH as noted below including PAD and a recent stroke earlier this year who presents with pain and possible deformity to the right shoulder, as well as intermittent pain and occasional discoloration to the right arm and right leg.  All of these symptoms have been occurring since he was hospitalized in August for the stroke.  The wife reports that the right shoulder appears deformed and she is concerned it could be dislocated.  He has not had any specific injury to the arm, but he has been chronically weak on the right side since the stroke.  The patient and wife also report crampy intermittent pain and swelling in both the right arm and right leg.  He is unable to move the right arm at all since the stroke.  They state that sometimes the arm and leg have turned blue, but this has not happened in the last several days.  Both the patient and wife state that they are have been no acute changes in the last few days, and they came in today because they were concerned about the ongoing symptoms rather than due to any acute issue.    Past Medical History:  Diagnosis Date  . Aphasia S/P CVA   . DDD (degenerative disc disease), cervical   . ED (erectile dysfunction)   . Hyperlipidemia   . Hypertension   . Peripheral arterial disease (HCC)   . Stroke [facility])     Patient Active Problem List   Diagnosis Date Noted  . Cerebral edema (HCC) 03/09/2020  . Acute ischemic left MCA stroke (HCC) 03/09/2020  . ICAO (internal carotid artery occlusion), left 03/09/2020  . Hypernatremia 03/09/2020   . Atrial fibrillation with RVR (HCC) 03/09/2020  . Scrotal edema 03/09/2020  . Dysphagia 03/09/2020  . Transaminitis 03/09/2020  . Essential hypertension   . Tobacco abuse   . Dyslipidemia   . Leukocytosis   . Acute blood loss anemia   . Global aphasia   . 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 Endotracheal tube present   . Tobacco use 02/21/2020  . Critical lower limb ischemia (HCC) 02/21/2020  . Aphasia S/P CVA 09/29/2019  . History of stroke 09/29/2019  . Hypertension, essential 09/29/2019  . PAD (peripheral artery disease) (HCC) 10/25/2016  . DDD (degenerative disc disease), cervical 01/07/2016  . Neck pain 01/07/2016  . Hyperlipidemia, unspecified 05/23/2013  . ED (erectile dysfunction) 11/13/2012    Past Surgical History:  Procedure Laterality Date  . FEMORAL-FEMORAL BYPASS GRAFT Bilateral 02/22/2020   Procedure: bilateral ileofemoral thrombectomy, left popliteal exposure and thrombectomy, four compartment fasciotomy, left femoral endarterectomy with patch angioplasty;  Surgeon: Nada Libman, MD;  Location: [facility] ORS;  Service: Vascular;  Laterality: Bilateral;  . LOWER EXTREMITY ANGIOGRAPHY Left 02/21/2020   Procedure: LOWER EXTREMITY ANGIOGRAPHY;  Surgeon: Renford Dills, MD;  Location: [facility] INVASIVE CV LAB;  Service: Cardiovascular;  Laterality: Left;  . PERIPHERAL VASCULAR CATHETERIZATION N/A 07/21/2015   Procedure: Abdominal Aortogram w/Lower Extremity;  Surgeon: Renford Dills, MD;  Location: [facility] INVASIVE CV LAB;  Service: Cardiovascular;  Laterality: N/A;  .  PERIPHERAL VASCULAR CATHETERIZATION  07/21/2015   Procedure: Lower Extremity Intervention;  Surgeon: Renford Dills, MD;  Location: [facility] INVASIVE CV LAB;  Service: Cardiovascular;;    Prior to Admission medications   Medication Sig Start Date End Date Taking? Authorizing Provider  albuterol (PROVENTIL) (2.5 MG/3ML) 0.083% nebulizer solution Inhale 3 mLs into the lungs every 6  (six) hours as needed for wheezing or shortness of breath. 03/09/20   Roberto Scales D, MD  amLODipine (NORVASC) 10 MG tablet Take 1 tablet (10 mg total) by mouth daily. 03/10/20   Laverna Peace, MD  apixaban (ELIQUIS) 5 MG TABS tablet Take 1 tablet (5 mg total) by mouth 2 (two) times daily. 03/09/20   Laverna Peace, MD  atorvastatin (LIPITOR) 80 MG tablet Take 1 tablet (80 mg total) by mouth daily. 03/10/20   Roberto Scales D, MD  chlorhexidine (PERIDEX) 0.12 % solution 15 mLs by Mouth Rinse route 2 (two) times daily. 03/09/20   Roberto Scales D, MD  clopidogrel (PLAVIX) 75 MG tablet Take 1 tablet (75 mg total) by mouth daily. 03/10/20   Laverna Peace, MD  docusate sodium (COLACE) 100 MG capsule Take 1 capsule (100 mg total) by mouth 2 (two) times daily as needed for mild constipation. 03/09/20   Laverna Peace, MD  feeding supplement, ENSURE ENLIVE, (ENSURE ENLIVE) LIQD Take 237 mLs by mouth 2 (two) times daily between meals. 03/09/20   Laverna Peace, MD  folic acid (FOLVITE) 1 MG tablet Take 1 tablet (1 mg total) by mouth daily. 03/10/20   Laverna Peace, MD  guaiFENesin (MUCINEX) 600 MG 12 hr tablet Take 1 tablet (600 mg total) by mouth 2 (two) times daily. 03/09/20   Roberto Scales D, MD  lisinopril (ZESTRIL) 40 MG tablet Take 1 tablet (40 mg total) by mouth daily. 03/10/20   Roberto Scales D, MD  Metoprolol Tartrate 75 MG TABS Take 75 mg by mouth 2 (two) times daily. 03/09/20   Laverna Peace, MD  Multiple Vitamin (MULTIVITAMIN WITH MINERALS) TABS tablet Take 1 tablet by mouth daily. 03/10/20   Laverna Peace, MD  nicotine (NICODERM CQ - DOSED IN MG/24 HOURS) 21 mg/24hr patch Place 1 patch (21 mg total) onto the skin daily at 8 pm. 03/09/20   Roberto Scales D, MD  pantoprazole (PROTONIX) 40 MG tablet Take 1 tablet (40 mg total) by mouth daily at 8 pm. 03/09/20   Roberto Scales D, MD  polyethylene glycol (MIRALAX / GLYCOLAX) 17 g packet Take 17 g by mouth daily as needed for moderate  constipation. 03/09/20   Laverna Peace, MD    Allergies Patient has no known allergies.  Family History  Problem Relation Age of Onset  . Emphysema Mother   . Heart attack Father   . Heart attack Paternal Grandmother     Social History Social History   Tobacco Use  . Smoking status: Current Every Day Smoker    Types: Cigarettes  . Smokeless tobacco: Never Used  Vaping Use  . Vaping Use: Never used  Substance Use Topics  . Alcohol use: Yes  . Drug use: Never    Review of Systems Level 5 caveat: Review of systems limited due to aphasia Constitutional: No fever. Cardiovascular: Denies chest pain. Respiratory: Denies shortness of breath. Gastrointestinal: No vomiting. Musculoskeletal: Positive for right shoulder pain. Skin: Negative for rash.   ____________________________________________   PHYSICAL EXAM:  VITAL SIGNS: ED Triage Vitals  Enc Vitals Group  BP 06/01/20 1215 (!) 155/69     Pulse Rate 06/01/20 1215 93     Resp 06/01/20 1215 16     Temp 06/01/20 1215 (!) 97.5 F (36.4 C)     Temp Source 06/01/20 1215 Oral     SpO2 06/01/20 1215 99 %     Weight 06/01/20 1216 186 lb 8.2 oz (84.6 kg)     Height 06/01/20 1216 5\' 7"  (1.702 m)     Head Circumference --      Peak Flow --      Pain Score --      Pain Loc --      Pain Edu? --      Excl. in GC? --     Constitutional: Alert and oriented.  Chronically ill-appearing but in no acute distress. Eyes: Conjunctivae are normal.  Head: Atraumatic. Nose: No congestion/rhinnorhea. Mouth/Throat: Mucous membranes are moist.   Neck: Normal range of motion.  Cardiovascular: Normal rate, regular rhythm.  Good peripheral circulation. Respiratory: Normal respiratory effort.  No retractions.  Gastrointestinal: No distention.  Musculoskeletal: No lower extremity edema.  Extremities warm and well perfused.  2+ radial pulse to the RUE and DP pulse to the RLE.  Cap refill less than 2 seconds in all extremities.  No  discoloration or pallor.  Extremities nontender with good range of motion.  Mild swelling to the dorsal and posterior right shoulder, with no focal bony tenderness.  No significant deformity to the right shoulder. Neurologic:  Normal speech and language. No acute neurologic deficits.   Skin:  Skin is warm and dry. No rash noted. Psychiatric: Calm and cooperative.  ____________________________________________   LABS (all labs ordered are listed, but only abnormal results are displayed)  Labs Reviewed - No data to display ____________________________________________  EKG   ____________________________________________  RADIOLOGY  venous RUE: No acute DVT US venous RLE: No acute DVT XR R shoulder interpreted by me shows no acute fracture or dislocation.  ____________________________________________   PROCEDURES  Procedure(s) performed: No  Procedures  Critical Care performed: No ____________________________________________   INITIAL IMPRESSION / ASSESSMENT AND PLAN / ED COURSE  Pertinent labs & imaging results that were available during my care of the patient were reviewed by me and considered in my medical decision making (see chart for details).  59 year old male with PMH as noted above including PAD and a recent stroke presents with subacute to chronic right shoulder pain and perceived deformity, as well as intermittent pain and occasional episodes of discoloration in the right arm and right leg.  All the symptoms have been the same since he had the stroke in August.  The patient and his wife state that he has been compliant with his Plavix and Eliquis.  I reviewed the past medical records in epic.  The patient initially had acute ischemia of the lower extremities with bilateral common and external iliac artery stenting on 8/6.  He then had left common femoral endarterectomy and thrombectomy, and subsequent left hemispheric stroke leaving him with right-sided weakness  and aphasia.  He was last seen by vascular surgery on 9/13 for routine follow-up.  On exam today, the patient is overall comfortable appearing.  His vital signs are normal except for mild hypertension.  The right shoulder shows some possible swelling and muscle contraction posteriorly, but there is no gross deformity or focal bony tenderness.  Both the right upper and lower extremities have normal color, 2+ distal pulses, normal cap refill, and no tenderness or other  acute exam findings.  The shoulder pain is most likely due to muscle contractures.  We will obtain an x-ray to evaluate for dislocation or fracture.  Overall I suspect that the intermittent right arm and leg pain are also related to the stroke, weakness, and muscle contractures rather than any acute vascular etiology especially given that the patient has not had any acute change in his symptoms recently and states that everything has been the same since the stroke.  There is no clinical evidence for acute thrombosis.  We will obtain ultrasounds of the right upper and lower extremities to rule out DVT.  ----------------------------------------- 3:22 PM on 06/01/2020 -----------------------------------------  X-ray shows no evidence of dislocation or other acute abnormality.  The DVT studies are also negative.  The patient's wife reports that the patient has follow-up with vascular surgery on 12/6.  Given that there is no evidence of acute extremity ischemia or vascular occlusion, there is no indication for further ED work-up or observation.  I counseled the patient and his wife thoroughly on return precautions and the importance of follow-up.  They expressed understanding.  ____________________________________________   FINAL CLINICAL IMPRESSION(S) / ED DIAGNOSES  Final diagnoses:  Arm swelling  Chronic right shoulder pain  Right leg pain      NEW MEDICATIONS STARTED DURING THIS VISIT:  New Prescriptions   No medications on  file     Note:  This document was prepared using Dragon voice recognition software and may include unintentional dictation errors.   Dionne BucySiadecki, Samuel Rittenhouse, MD 06/01/20 [id number]    Dionne BucySiadecki, Emiley Digiacomo, MD 06/01/20 1525

## 2020-06-01 NOTE — Discharge Instructions (Addendum)
The pain in your shoulder is likely from muscle contractions after your stroke.  You should continue the physical therapy.  Return to the ER immediately for new, worsening, or persistent severe arm or leg pain, swelling, discoloration, loss of sensation, weakness or numbness, or any other new or worsening symptoms that concern you.    Follow-up with the vascular surgeon on December 6 as scheduled.

## 2020-06-22 ENCOUNTER — Ambulatory Visit (INDEPENDENT_AMBULATORY_CARE_PROVIDER_SITE_OTHER): Payer: BC Managed Care – PPO | Admitting: Nurse Practitioner

## 2020-06-22 ENCOUNTER — Encounter (INDEPENDENT_AMBULATORY_CARE_PROVIDER_SITE_OTHER): Payer: BC Managed Care – PPO

## 2020-06-22 ENCOUNTER — Encounter (INDEPENDENT_AMBULATORY_CARE_PROVIDER_SITE_OTHER): Payer: Self-pay

## 2021-01-09 ENCOUNTER — Other Ambulatory Visit: Payer: Self-pay | Admitting: Surgery

## 2021-07-07 ENCOUNTER — Emergency Department: Payer: Self-pay

## 2021-07-07 ENCOUNTER — Other Ambulatory Visit: Payer: Self-pay

## 2021-07-07 ENCOUNTER — Emergency Department
Admission: EM | Admit: 2021-07-07 | Discharge: 2021-07-07 | Disposition: A | Discharge status: Home / Self Care | Payer: Self-pay | Attending: Emergency Medicine | Admitting: Emergency Medicine

## 2021-07-07 DIAGNOSIS — R569 Unspecified convulsions: Secondary | ICD-10-CM

## 2021-07-07 DIAGNOSIS — J111 Influenza due to unidentified influenza virus with other respiratory manifestations: Secondary | ICD-10-CM

## 2021-07-07 DIAGNOSIS — I110 Hypertensive heart disease with heart failure: Secondary | ICD-10-CM | POA: Insufficient documentation

## 2021-07-07 DIAGNOSIS — F17210 Nicotine dependence, cigarettes, uncomplicated: Secondary | ICD-10-CM | POA: Insufficient documentation

## 2021-07-07 DIAGNOSIS — I509 Heart failure, unspecified: Secondary | ICD-10-CM

## 2021-07-07 DIAGNOSIS — R790 Abnormal level of blood mineral: Secondary | ICD-10-CM | POA: Insufficient documentation

## 2021-07-07 DIAGNOSIS — Z8673 Personal history of transient ischemic attack (TIA), and cerebral infarction without residual deficits: Secondary | ICD-10-CM | POA: Insufficient documentation

## 2021-07-07 DIAGNOSIS — N390 Urinary tract infection, site not specified: Secondary | ICD-10-CM

## 2021-07-07 DIAGNOSIS — Z7902 Long term (current) use of antithrombotics/antiplatelets: Secondary | ICD-10-CM | POA: Insufficient documentation

## 2021-07-07 DIAGNOSIS — Z20822 Contact with and (suspected) exposure to covid-19: Secondary | ICD-10-CM | POA: Insufficient documentation

## 2021-07-07 DIAGNOSIS — Z7901 Long term (current) use of anticoagulants: Secondary | ICD-10-CM | POA: Insufficient documentation

## 2021-07-07 DIAGNOSIS — Z79899 Other long term (current) drug therapy: Secondary | ICD-10-CM | POA: Insufficient documentation

## 2021-07-07 LAB — URINALYSIS, ROUTINE W REFLEX MICROSCOPIC
Bilirubin Urine: NEGATIVE
Glucose, UA: NEGATIVE mg/dL
Ketones, ur: NEGATIVE mg/dL
Nitrite: NEGATIVE
Protein, ur: NEGATIVE mg/dL
Specific Gravity, Urine: 1.02 (ref 1.005–1.030)
pH: 7 (ref 5.0–8.0)

## 2021-07-07 LAB — COMPREHENSIVE METABOLIC PANEL
ALT: 10 U/L (ref 0–44)
AST: 13 U/L — ABNORMAL LOW (ref 15–41)
Albumin: 3.7 g/dL (ref 3.5–5.0)
Alkaline Phosphatase: 55 U/L (ref 38–126)
Anion gap: 6 (ref 5–15)
BUN: 9 mg/dL (ref 6–20)
CO2: 23 mmol/L (ref 22–32)
Calcium: 8.6 mg/dL — ABNORMAL LOW (ref 8.9–10.3)
Chloride: 104 mmol/L (ref 98–111)
Creatinine, Ser: 0.67 mg/dL (ref 0.61–1.24)
GFR, Estimated: >60 mL/min (ref >60)
Glucose, Bld: 111 mg/dL — ABNORMAL HIGH (ref 70–99)
Potassium: 3.6 mmol/L (ref 3.5–5.1)
Sodium: 133 mmol/L — ABNORMAL LOW (ref 135–145)
Total Bilirubin: 0.7 mg/dL (ref 0.3–1.2)
Total Protein: 6.6 g/dL (ref 6.5–8.1)

## 2021-07-07 LAB — URINALYSIS, MICROSCOPIC (REFLEX): WBC, UA: >50 WBC/hpf (ref 0–5)

## 2021-07-07 LAB — URINE DRUG SCREEN, QUALITATIVE (ARMC ONLY)
Amphetamines, Ur Screen: NOT DETECTED
Barbiturates, Ur Screen: NOT DETECTED
Benzodiazepine, Ur Scrn: POSITIVE — AB
Cannabinoid 50 Ng, Ur ~~LOC~~: NOT DETECTED
Cocaine Metabolite,Ur ~~LOC~~: NOT DETECTED
MDMA (Ecstasy)Ur Screen: NOT DETECTED
Methadone Scn, Ur: NOT DETECTED
Opiate, Ur Screen: NOT DETECTED
Phencyclidine (PCP) Ur S: NOT DETECTED
Tricyclic, Ur Screen: NOT DETECTED

## 2021-07-07 LAB — CBC WITH DIFFERENTIAL/PLATELET
Abs Immature Granulocytes: 0.03 10*3/uL (ref 0.00–0.07)
Basophils Absolute: 0 10*3/uL (ref 0.0–0.1)
Basophils Relative: 0 %
Eosinophils Absolute: 0 10*3/uL (ref 0.0–0.5)
Eosinophils Relative: 0 %
HCT: 44.3 % (ref 39.0–52.0)
Hemoglobin: 14.9 g/dL (ref 13.0–17.0)
Immature Granulocytes: 0 %
Lymphocytes Relative: 7 %
Lymphs Abs: 0.7 10*3/uL (ref 0.7–4.0)
MCH: 29.3 pg (ref 26.0–34.0)
MCHC: 33.6 g/dL (ref 30.0–36.0)
MCV: 87 fL (ref 80.0–100.0)
Monocytes Absolute: 0.5 10*3/uL (ref 0.1–1.0)
Monocytes Relative: 5 %
Neutro Abs: 9.3 10*3/uL — ABNORMAL HIGH (ref 1.7–7.7)
Neutrophils Relative %: 88 %
Platelets: 220 10*3/uL (ref 150–400)
RBC: 5.09 MIL/uL (ref 4.22–5.81)
RDW: 13 % (ref 11.5–15.5)
WBC: 10.7 10*3/uL — ABNORMAL HIGH (ref 4.0–10.5)
nRBC: 0 % (ref 0.0–0.2)

## 2021-07-07 LAB — TROPONIN I (HIGH SENSITIVITY): Troponin I (High Sensitivity): 42 ng/L — ABNORMAL HIGH (ref <18)

## 2021-07-07 LAB — BRAIN NATRIURETIC PEPTIDE: B Natriuretic Peptide: 228.5 pg/mL — ABNORMAL HIGH (ref 0.0–100.0)

## 2021-07-07 LAB — RESP PANEL BY RT-PCR (FLU A&B, COVID) ARPGX2
Influenza A by PCR: POSITIVE — AB
Influenza B by PCR: NEGATIVE
SARS Coronavirus 2 by RT PCR: NEGATIVE

## 2021-07-07 MED ORDER — LEVETIRACETAM IN NACL 1000 MG/100ML IV SOLN
1000.0000 mg | Freq: Once | INTRAVENOUS | Status: AC
  Administered 2021-07-07: 16:00:00 1000 mg via INTRAVENOUS
  Filled 2021-07-07: qty 100

## 2021-07-07 MED ORDER — OSELTAMIVIR PHOSPHATE 75 MG PO CAPS: 75.0000 mg | ORAL_CAPSULE | Freq: Two times a day (BID) | ORAL | 0 refills | Status: AC

## 2021-07-07 MED ORDER — CEPHALEXIN 500 MG PO CAPS: 500.0000 mg | ORAL_CAPSULE | Freq: Three times a day (TID) | ORAL | 0 refills | Status: AC

## 2021-07-07 MED ORDER — LEVETIRACETAM 500 MG PO TABS: 500.0000 mg | ORAL_TABLET | Freq: Two times a day (BID) | ORAL | 10 refills | Status: AC

## 2021-07-07 MED ORDER — FUROSEMIDE 20 MG PO TABS: 20.0000 mg | ORAL_TABLET | ORAL | 11 refills | Status: AC

## 2021-07-07 NOTE — ED Triage Notes (Signed)
Pt here via [facility] with a seizure. Pt had a stroke with R side deficits. Pt is a left AKA. Pt received 2.5 of versed with ems for aggression with ems.  Pt stable on arrival.

## 2021-07-07 NOTE — TOC Initial Note (Signed)
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  He lives at home with is wife, she is his caregiver, he is mostly in a lift chair, she says their home is not big enough for a hospital bed.  He also has a Conservation officer, historic buildings wheelchair.  Patient does not have insurance, they live off his social security, they are worried about medical bills.  Patient also has trouble with transportation as it is difficult to get him in and out of a vehicle.  RNCM provided patient and wife with information on [employer] and reached out to [employer] with financial services, she reports that the patient has applied for disability earlier this year.   Amy did say she would reach out and screen him by phone.    Patient will likely be discharged from the ER this afternoon after all testing has been completed.    Expected Discharge Plan: Home/Self Care Barriers to Discharge: Continued Medical Work up   Patient Goals and CMS Choice Patient states their goals for this hospitalization and ongoing recovery are:: patient will return home with wife- she is his caregiver      Expected Discharge Plan and Services Expected Discharge Plan: Home/Self Care In-house Referral: Financial Counselor Discharge Planning Services: CM Consult   Living arrangements for the past 2 months: Single Family Home                 DME Arranged: N/A DME Agency: NA       HH Arranged: NA HH Agency: NA        Prior Living Arrangements/Services Living arrangements for the past 2 months: Single Family Home Lives with:: Spouse,  Relatives Patient language and need for interpreter reviewed:: Yes Do you feel safe going back to the place where you live?: Yes      Need for Family Participation in Patient Care: Yes (Comment) Care giver support system in place?: Yes (comment) Current home services: DME (lift chair, wheelchair, power chair) Criminal Activity/Legal Involvement Pertinent to Current Situation/Hospitalization: No - Comment as needed  Activities of Daily Living      Permission Sought/Granted Permission sought to share information with : Family Supports Permission granted to share information with : Yes, Verbal Permission Granted  Share Information with NAME: Zeth Buday     Permission granted to share info w Relationship: spouse  Permission granted to share info w Contact Information: [phone]  Emotional Assessment Appearance:: Appears older than stated age     Orientation: : Oriented to Self Alcohol / Substance Use: Not Applicable Psych Involvement: No (comment)  Admission diagnosis:  Seizures - EMS Patient Active Problem List   Diagnosis Date Noted   Cerebral edema (HCC) 03/09/2020   Acute ischemic left MCA stroke (HCC) 03/09/2020   ICAO (internal carotid artery occlusion), left 03/09/2020   Hypernatremia 03/09/2020   Atrial fibrillation with RVR (HCC) 03/09/2020   Scrotal edema 03/09/2020   Dysphagia 03/09/2020   Transaminitis 03/09/2020   Essential hypertension    Tobacco  abuse    Dyslipidemia    Leukocytosis    Acute blood loss anemia    Global aphasia    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 [place], [place] - [address] [place] [address] Phone: [phone] Fax: [phone]     Social Determinants of Health (SDOH) Interventions    Readmission Risk Interventions No flowsheet data found.

## 2021-07-07 NOTE — ED Notes (Signed)
Changed and cleaned patient up. Pulled both IV's in right arm ,and ED tech Shawn is getting the last set of vital signs.

## 2021-07-07 NOTE — ED Provider Notes (Signed)
[facility] Emergency Department Provider Note   ____________________________________________   Event Date/Time   First MD Initiated Contact with Patient 07/07/21 1332     (approximate)  I have reviewed the triage vital signs and the nursing notes.   HISTORY  Chief Complaint Seizures History limited by grogginess status post seizure and Versed  HPI Jonathan [name] is a 60 y.o. male who comes in with his first seizure.  Per EMS he was with his wife was acting normally asking for a coffee and had a seizure which apparently was tonic-clonic.  He was postictal afterwards and began combative swinging at the medic.  He therefore got 2 mg of Versed.  He had a history of a past left-sided stroke with right-sided weakness in 2021.  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  He is currently awake but groggy.         Past Medical History:  Diagnosis Date   Aphasia S/P CVA    DDD (degenerative disc disease), cervical    ED (erectile dysfunction)    Hyperlipidemia    Hypertension    Peripheral arterial disease (HCC)    Stroke [facility])     Patient Active Problem List   Diagnosis Date Noted   Cerebral edema (HCC) 03/09/2020   Acute ischemic left MCA stroke (HCC) 03/09/2020   ICAO (internal carotid artery occlusion), left 03/09/2020   Hypernatremia 03/09/2020   Atrial fibrillation with RVR (HCC) 03/09/2020   Scrotal edema 03/09/2020   Dysphagia 03/09/2020   Transaminitis 03/09/2020   Essential hypertension    Tobacco abuse    Dyslipidemia    Leukocytosis    Acute blood loss anemia    Global aphasia    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) 02/23/2020   Endotracheal tube present    Tobacco use 02/21/2020   Critical lower limb ischemia (HCC) 02/21/2020   Aphasia S/P CVA 09/29/2019   History of stroke 09/29/2019   Hypertension, essential 09/29/2019   PAD (peripheral artery disease) (HCC) 10/25/2016   DDD (degenerative disc disease),  cervical 01/07/2016   Neck pain 01/07/2016   Hyperlipidemia, unspecified 05/23/2013   ED (erectile dysfunction) 11/13/2012    Past Surgical History:  Procedure Laterality Date   FEMORAL-FEMORAL BYPASS GRAFT Bilateral 02/22/2020   Procedure: bilateral ileofemoral thrombectomy, left popliteal exposure and thrombectomy, four compartment fasciotomy, left femoral endarterectomy with patch angioplasty;  Surgeon: Nada Libman, MD;  Location: [facility] ORS;  Service: Vascular;  Laterality: Bilateral;   LOWER EXTREMITY ANGIOGRAPHY Left 02/21/2020   Procedure: LOWER EXTREMITY ANGIOGRAPHY;  Surgeon: Renford Dills, MD;  Location: [facility] INVASIVE CV LAB;  Service: Cardiovascular;  Laterality: Left;   PERIPHERAL VASCULAR CATHETERIZATION N/A 07/21/2015   Procedure: Abdominal Aortogram w/Lower Extremity;  Surgeon: Renford Dills, MD;  Location: [facility] INVASIVE CV LAB;  Service: Cardiovascular;  Laterality: N/A;   PERIPHERAL VASCULAR CATHETERIZATION  07/21/2015   Procedure: Lower Extremity Intervention;  Surgeon: Renford Dills, MD;  Location: [facility] INVASIVE CV LAB;  Service: Cardiovascular;;    Prior to Admission medications   Medication Sig Start Date End Date Taking? Authorizing Provider  cephALEXin (KEFLEX) 500 MG capsule Take 1 capsule (500 mg total) by mouth 3 (three) times daily for 10 days. 07/07/21 07/17/21 Yes Arnaldo Natal, MD  furosemide (LASIX) 20 MG tablet Take 1 tablet (20 mg total) by mouth as directed. Take 1 tablet every 3 days.  Start today. 07/07/21 07/07/22 Yes Arnaldo Natal, MD  levETIRAcetam (KEPPRA) 500 MG tablet Take 1 tablet (500 mg total) by mouth 2 (two) times daily. 07/07/21  Yes Nena Polio, MD  oseltamivir (TAMIFLU) 75 MG capsule Take 1 capsule (75 mg total) by mouth 2 (two) times daily for 5 days. 07/07/21 07/12/21 Yes Nena Polio, MD  albuterol (PROVENTIL) (2.5 MG/3ML) 0.083% nebulizer solution Inhale 3 mLs into the lungs every 6 (six) hours as needed for wheezing or  shortness of breath. 03/09/20   Oretha Milch D, MD  amLODipine (NORVASC) 10 MG tablet Take 1 tablet (10 mg total) by mouth daily. 03/10/20   Desiree Hane, MD  apixaban (ELIQUIS) 5 MG TABS tablet Take 1 tablet (5 mg total) by mouth 2 (two) times daily. 03/09/20   Desiree Hane, MD  atorvastatin (LIPITOR) 80 MG tablet TAKE 1 TABLET BY MOUTH EVERY DAY 01/09/21   Waynetta Sandy, MD  chlorhexidine (PERIDEX) 0.12 % solution 15 mLs by Mouth Rinse route 2 (two) times daily. 03/09/20   Oretha Milch D, MD  clopidogrel (PLAVIX) 75 MG tablet Take 1 tablet (75 mg total) by mouth daily. 03/10/20   Desiree Hane, MD  docusate sodium (COLACE) 100 MG capsule Take 1 capsule (100 mg total) by mouth 2 (two) times daily as needed for mild constipation. 03/09/20   Desiree Hane, MD  feeding supplement, ENSURE ENLIVE, (ENSURE ENLIVE) LIQD Take 237 mLs by mouth 2 (two) times daily between meals. 03/09/20   Desiree Hane, MD  folic acid (FOLVITE) 1 MG tablet Take 1 tablet (1 mg total) by mouth daily. 03/10/20   Desiree Hane, MD  guaiFENesin (MUCINEX) 600 MG 12 hr tablet Take 1 tablet (600 mg total) by mouth 2 (two) times daily. 03/09/20   Oretha Milch D, MD  lisinopril (ZESTRIL) 40 MG tablet Take 1 tablet (40 mg total) by mouth daily. 03/10/20   Oretha Milch D, MD  Metoprolol Tartrate 75 MG TABS Take 75 mg by mouth 2 (two) times daily. 03/09/20   Desiree Hane, MD  Multiple Vitamin (MULTIVITAMIN WITH MINERALS) TABS tablet Take 1 tablet by mouth daily. 03/10/20   Desiree Hane, MD  nicotine (NICODERM CQ - DOSED IN MG/24 HOURS) 21 mg/24hr patch Place 1 patch (21 mg total) onto the skin daily at 8 pm. 03/09/20   Oretha Milch D, MD  pantoprazole (PROTONIX) 40 MG tablet Take 1 tablet (40 mg total) by mouth daily at 8 pm. 03/09/20   Oretha Milch D, MD  polyethylene glycol (MIRALAX / GLYCOLAX) 17 g packet Take 17 g by mouth daily as needed for moderate constipation. 03/09/20   Desiree Hane, MD     Allergies Patient has no known allergies.  Family History  Problem Relation Age of Onset   Emphysema Mother    Heart attack Father    Heart attack Paternal Grandmother     Social History Social History   Tobacco Use   Smoking status: Every Day    Types: Cigarettes   Smokeless tobacco: Never  Vaping Use   Vaping Use: Never used  Substance Use Topics   Alcohol use: Yes   Drug use: Never    Review of Systems  Unable to obtain ____________________________________________   PHYSICAL EXAM:  VITAL SIGNS: ED Triage Vitals  Enc Vitals Group     BP      Pulse      Resp      Temp      Temp src  SpO2      Weight      Height      Head Circumference      Peak Flow      Pain Score      Pain Loc      Pain Edu?      Excl. in [place]?     Constitutional: Groggy but awake follows some commands Eyes: Conjunctivae are normal. PER Head: Atraumatic. Nose: No congestion/rhinnorhea. Mouth/Throat: Mucous membranes are moist.  Oropharynx non-erythematous. Neck: No stridor.  Supple. Cardiovascular: Normal rate, regular rhythm. Grossly normal heart sounds.   Respiratory: Normal respiratory effort.  No retractions. Lungs CTAB.  Patient does have a wet sounding cough. Gastrointestinal: Soft and nontender. No distention. No abdominal bruits.  Genitourinary: Normal male circumcised has a condom catheter on Musculoskeletal: Left AKA. Neurologic: Speech is garbled and unclear I cannot understand what he saying Skin:  Skin is warm, dry and intact. No rash noted.   ____________________________________________   LABS (all labs ordered are listed, but only abnormal results are displayed)  Labs Reviewed  RESP PANEL BY RT-PCR (FLU A&B, COVID) ARPGX2 - Abnormal; Notable for the following components:      Result Value   Influenza A by PCR POSITIVE (*)    All other components within normal limits  COMPREHENSIVE METABOLIC PANEL - Abnormal; Notable for the following components:    Sodium 133 (*)    Glucose, Bld 111 (*)    Calcium 8.6 (*)    AST 13 (*)    All other components within normal limits  CBC WITH DIFFERENTIAL/PLATELET - Abnormal; Notable for the following components:   WBC 10.7 (*)    Neutro Abs 9.3 (*)    All other components within normal limits  URINALYSIS, ROUTINE W REFLEX MICROSCOPIC - Abnormal; Notable for the following components:   APPearance HAZY (*)    Hgb urine dipstick SMALL (*)    Leukocytes,Ua LARGE (*)    All other components within normal limits  URINE DRUG SCREEN, QUALITATIVE ([facility] ONLY) - Abnormal; Notable for the following components:   Benzodiazepine, Ur Scrn POSITIVE (*)    All other components within normal limits  URINALYSIS, MICROSCOPIC (REFLEX) - Abnormal; Notable for the following components:   Bacteria, UA MANY (*)    All other components within normal limits  BRAIN NATRIURETIC PEPTIDE - Abnormal; Notable for the following components:   B Natriuretic Peptide 228.5 (*)    All other components within normal limits  TROPONIN I (HIGH SENSITIVITY) - Abnormal; Notable for the following components:   Troponin I (High Sensitivity) 42 (*)    All other components within normal limits  URINE CULTURE  CBG MONITORING, ED   ____________________________________________  EKG  EKG read interpreted by me shows sinus tachycardia rate of 104 normal axis no acute ST-T wave changes there are some nonspecific changes present. ____________________________________________  RADIOLOGY Gertha Calkin, personally viewed and evaluated these images (plain radiographs) as part of my medical decision making, as well as reviewing the written report by the radiologist.  ED MD interpretation: Radiology reads his chest x-ray is cardiomegaly and possible CHF.  His BNP is somewhat elevated at 224.  Official radiology report(s): CT Head Wo Contrast  Result Date: 07/07/2021 CLINICAL DATA:  Seizure, new-onset, no history of trauma hx cva w/ l side  weakness in 2021 EXAM: CT HEAD WITHOUT CONTRAST TECHNIQUE: Contiguous axial images were obtained from the base of the skull through the vertex without intravenous contrast. COMPARISON:  CT head 02/28/2020 FINDINGS: Brain: Redemonstration of large left middle cerebral artery territory encephalomalacia due to prior infarction. Infarct involves the left basal ganglia. Patchy and confluent areas of decreased attenuation are noted throughout the deep and periventricular white matter of the cerebral hemispheres bilaterally, compatible with chronic microvascular ischemic disease. No evidence of large-territorial acute infarction. No parenchymal hemorrhage. No mass lesion. No extra-axial collection. No mass effect or midline shift. No hydrocephalus. Basilar cisterns are patent. Vascular: No hyperdense vessel. Atherosclerotic calcifications are present within the cavernous internal carotid arteries. Skull: No acute fracture or focal lesion. Sinuses/Orbits: Paranasal sinuses and mastoid air cells are clear. The orbits are unremarkable. Other: None. Electronically Signed   By: Iven Finn M.D.   On: 07/07/2021 15:27   DG Chest Portable 1 View  Result Date: 07/07/2021 CLINICAL DATA:  Low-grade fever and seizures. EXAM: PORTABLE CHEST 1 VIEW COMPARISON:  Chest radiograph dated February 24, 2020 FINDINGS: The heart is enlarged. Evaluation is suboptimal due to mediastinal shift to the right. Atherosclerotic calcification of the aortic arch. Pulmonary vascular congestion. No focal consolidation or large pleural effusion. IMPRESSION: Cardiomegaly. Pulmonary vascular congestion. No focal consolidation or large pleural effusion. Electronically Signed   By: Keane Police D.O.   On: 07/07/2021 14:51    ____________________________________________   PROCEDURES  Procedure(s) performed (including Critical Care):  Procedures   ____________________________________________   INITIAL IMPRESSION / ASSESSMENT AND PLAN / ED  COURSE  Patient with a history of stroke.  The stroke scar may provide a locus to start the seizure.  His urine smells like there may be a UTI present we will check for that.  Additionally since he has a wet cough we will get the chest x-ray flu and COVID testing.  The patient's neck is supple.  The patient was at baseline and acting normally just prior to the seizure.  I doubt if there is any infectious cause of that especially with a temperature 99.  Since this is his first seizure we will check CBG electrolytes etc.  I would anticipate that after his seizure white count will be somewhat elevated.    ----------------------------------------- 3:46 PM on 07/07/2021 ----------------------------------------- Discussed patient with Dr. Quinn Axe neurology.  Will start the patient on Keppra since he has a scar from the stroke which was significant.  He is at high likelihood for having another seizure.  I will treat him with Keppra 500 twice daily and have him follow-up with outpatient neurology.   ----------------------------------------- 4:26 PM on 07/07/2021 ----------------------------------------- Chest x-ray has been read as suspicious for CHF basically.  BNP is elevated.  Patient has a history of a normal ejection fraction of 60 to 65% in 2021 when he had his stroke and leg amputation.  I will call the hospitalist and talk to them about possibly admitting this gentleman and checking him for new onset CHF which appears to be one of his several problems today.  ----------------------------------------- 4:35 PM on 07/07/2021 ----------------------------------------- Patient adamantly does not want to spend any time in the hospital.  He wants to go home.  His wife is okay with this.  They understand that new onset congestive heart failure is a very worrisome thing and could get worse.  It does indicate a new problem with his heart.  They ask if I can give him some Lasix.  I will give them a small amount  of Lasix every third day.  They will return if he is worse and follow-up with his doctor in the next  week.  They understand the significance of this.     ____________________________________________   FINAL CLINICAL IMPRESSION(S) / ED DIAGNOSES  Final diagnoses:  Seizure ([place])  Influenza  Urinary tract infection with hematuria, site unspecified  Congestive heart failure, unspecified HF chronicity, unspecified heart failure type [facility])     ED Discharge Orders          Ordered    oseltamivir (TAMIFLU) 75 MG capsule  2 times daily        07/07/21 1633    cephALEXin (KEFLEX) 500 MG capsule  3 times daily        07/07/21 1633    levETIRAcetam (KEPPRA) 500 MG tablet  2 times daily        07/07/21 1633    furosemide (LASIX) 20 MG tablet  As directed        07/07/21 1633             Note:  This document was prepared using Dragon voice recognition software and may include unintentional dictation errors.    Nena Polio, MD 07/07/21 [phone]

## 2021-07-07 NOTE — Discharge Instructions (Addendum)
I would like you to follow-up with the neurologist for the seizure.  Please see either Dr. Malvin Johns or Dr. Sherryll Burger.  Call them and let them know that you were seen in the hospital for your first seizure.  I spoke with our hospital neurologist and she feels that since you have the history of the large stroke we should start you on some antiseizure medicines with this will be Keppra 500 mg twice a day.  Please return if you have another seizure.  I will give you a prescription of Tamiflu for the flu.  Its been a couple days since you got sick so it may not work very well and if you cannot afford it is not essential that should get this medicine.  If you do get a higher fever or begin coughing more or coughing up colored phlegm and get short of breath please come back.  Occasionally you will get a regular bacterial pneumonia after the flu.  You also have a UTI.  I will give you Keflex 1 pill 3 times a day for that.  That should help.  You have some fluid buildup in your lungs.  Is very mild but since it is new it would be best if you spent a night or 2 in the hospital.  Since you do not I will give you 1 Lasix pill to take every 3 days.  Please have your doctor check on you sometime in the next week or so.  Please return if you have any increasing symptoms of fever shortness of breath or any other problems.  Please make sure you are drinking enough water and eating oranges or bananas every day.

## 2021-07-10 LAB — URINE CULTURE: Culture: >=100000 — AB

## 2021-07-11 NOTE — Progress Notes (Signed)
ED Antimicrobial Stewardship Positive Culture Follow Up   Jonathan [name] is an 60 y.o. male who presented to [facility] on 07/07/2021 with a chief complaint of  Chief Complaint  Patient presents with   Seizures    Recent Results (from the past 720 hour(s))  Urine Culture     Status: Abnormal   Collection Time: 07/07/21  1:48 PM   Specimen: In/Out Cath Urine  Result Value Ref Range Status   Specimen Description   Final    IN/OUT CATH URINE Performed at [facility], [address] [place], [place] [address]    Special Requests   Final    NONE Performed at [facility], [address] [place], [place] [address]    Culture >=100,000 COLONIES/mL ENTEROBACTER CLOACAE (A)  Final   Report Status 07/10/2021 FINAL  Final   Organism ID, Bacteria ENTEROBACTER CLOACAE (A)  Final      Susceptibility   Enterobacter cloacae - MIC*    CEFAZOLIN >=64 RESISTANT Resistant     CEFEPIME <=0.12 SENSITIVE Sensitive     CIPROFLOXACIN <=0.25 SENSITIVE Sensitive     GENTAMICIN <=1 SENSITIVE Sensitive     IMIPENEM 1 SENSITIVE Sensitive     NITROFURANTOIN 64 INTERMEDIATE Intermediate     TRIMETH/SULFA <=20 SENSITIVE Sensitive     PIP/TAZO <=4 SENSITIVE Sensitive     * >=100,000 COLONIES/mL ENTEROBACTER CLOACAE  Resp Panel by RT-PCR (Flu A&B, Covid) Nasopharyngeal Swab     Status: Abnormal   Collection Time: 07/07/21  2:18 PM   Specimen: Nasopharyngeal Swab; Nasopharyngeal(NP) swabs in vial transport medium  Result Value Ref Range Status   SARS Coronavirus 2 by RT PCR NEGATIVE NEGATIVE Final    Comment: (NOTE) SARS-CoV-2 target nucleic acids are NOT DETECTED.  The SARS-CoV-2 RNA is generally detectable in upper respiratory specimens during the acute phase of infection. The lowest concentration of SARS-CoV-2 viral copies this assay can detect is 138 copies/mL. A negative result does not preclude SARS-Cov-2 infection and should not be used as the sole basis for treatment  or other patient management decisions. A negative result may occur with  improper specimen collection/handling, submission of specimen other than nasopharyngeal swab, presence of viral mutation(s) within the areas targeted by this assay, and inadequate number of viral copies(<138 copies/mL). A negative result must be combined with clinical observations, patient history, and epidemiological information. The expected result is Negative.  Fact Sheet for Patients:  BloggerCourse.com  Fact Sheet for Healthcare Providers:  SeriousBroker.it  This test is no t yet approved or cleared by the [place] FDA and  has been authorized for detection and/or diagnosis of SARS-CoV-2 by FDA under an Emergency Use Authorization (EUA). This EUA will remain  in effect (meaning this test can be used) for the duration of the COVID-19 declaration under Section 564(b)(1) of the Act, 21 U.S.C.section 360bbb-3(b)(1), unless the authorization is terminated  or revoked sooner.       Influenza A by PCR POSITIVE (A) NEGATIVE Final   Influenza B by PCR NEGATIVE NEGATIVE Final    Comment: (NOTE) The Xpert Xpress SARS-CoV-2/FLU/RSV plus assay is intended as an aid in the diagnosis of influenza from Nasopharyngeal swab specimens and should not be used as a sole basis for treatment. Nasal washings and aspirates are unacceptable for Xpert Xpress SARS-CoV-2/FLU/RSV testing.  Fact Sheet for Patients: BloggerCourse.com  Fact Sheet for Healthcare Providers: SeriousBroker.it  This test is not yet approved or cleared by the [place] FDA and has  been authorized for detection and/or diagnosis of SARS-CoV-2 by FDA under an Emergency Use Authorization (EUA). This EUA will remain in effect (meaning this test can be used) for the duration of the COVID-19 declaration under Section 564(b)(1) of the Act, 21  U.S.C. section 360bbb-3(b)(1), unless the authorization is terminated or revoked.  Performed at [facility], [address] [place], [place] [address]     [x]  Treated with cephalexin, organism resistant to prescribed antimicrobial  New antibiotic prescription: Bactrim 1 DS tablet PO BID x7 days  ED Provider: Dr.   Pharmacy closed due to Christmas holiday, left voicemail with [facility] in [place] [phone]  Left voicemail with patient updating on changes in therapy.   [phone], PharmD Pharmacy Resident  07/11/2021 1:04 PM

## 2023-06-28 IMAGING — CT CT HEAD W/O CM
4 series · 16 of 47 positions shown, 18 images · non-contrast
Comparison: CT head 02/28/2020

CLINICAL DATA: Seizure, new-onset, no history of trauma hx cva w/ l
side weakness in 9897

EXAM:
CT HEAD WITHOUT CONTRAST
TECHNIQUE: Contiguous axial images were obtained from the base of the skull
through the vertex without intravenous contrast.

[Series 2: head wo · axial · 0.41mm/px · z∈[+288,+408]mm · 7 of 32 slices shown, 9 images]
[im 4/32  brain]
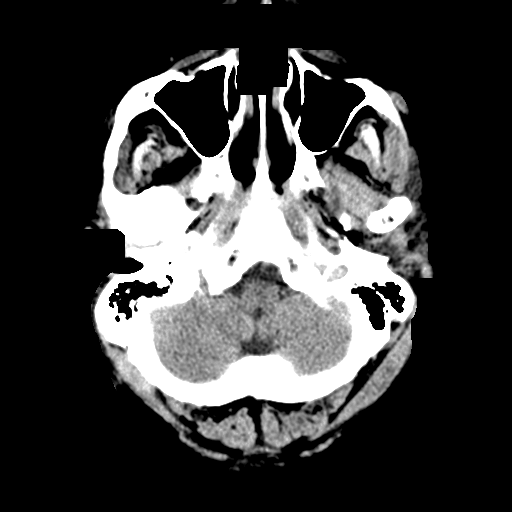
[im 4/32  bone]
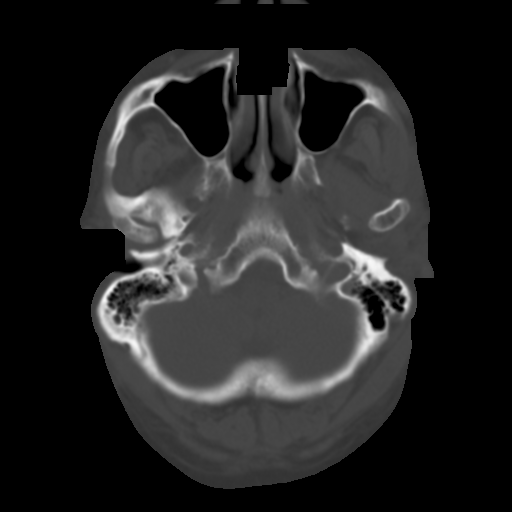
[im 8/32  brain]
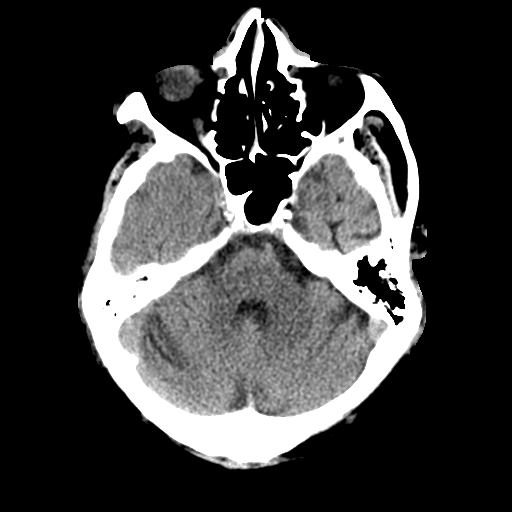
[im 12/32  brain]
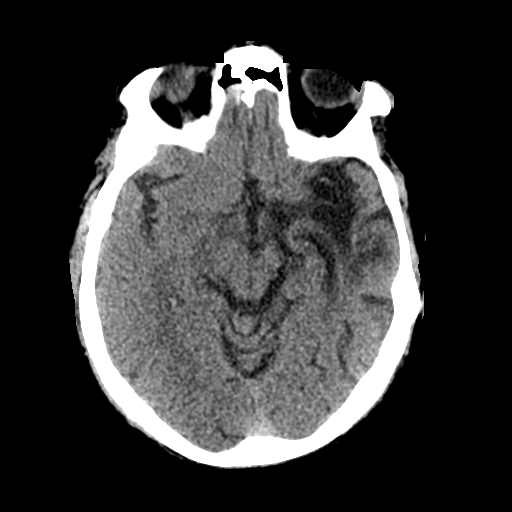
[im 16/32  brain]
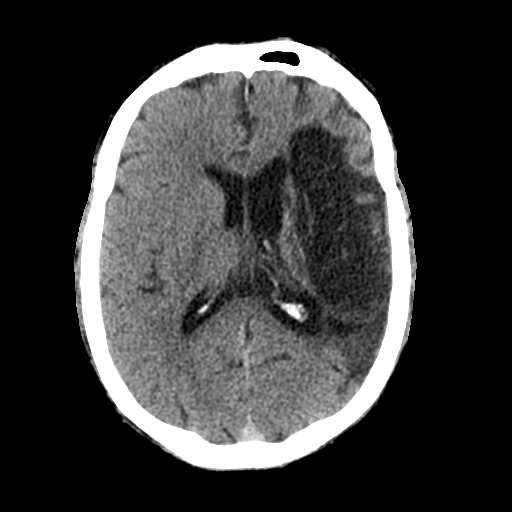
[im 20/32  brain]
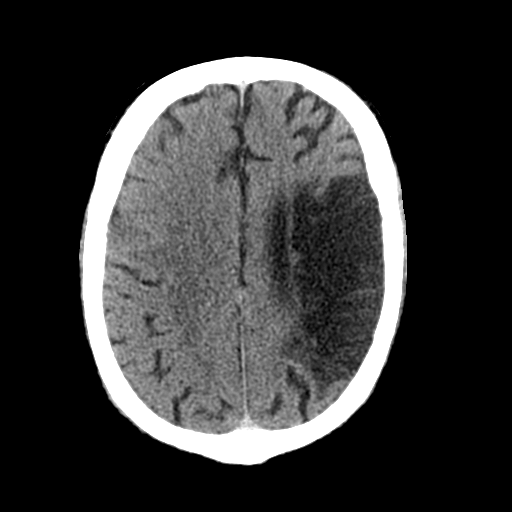
[im 20/32  bone]
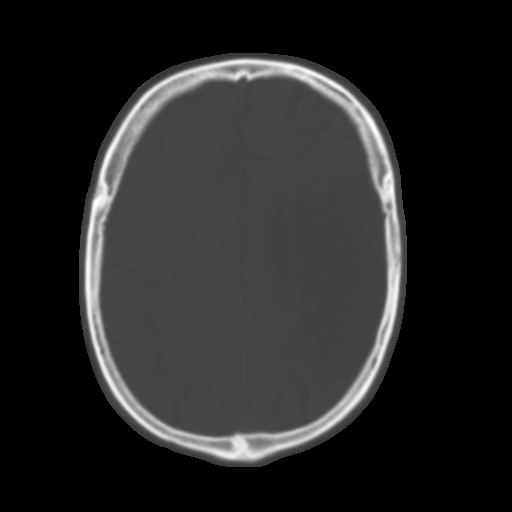
[im 24/32  brain]
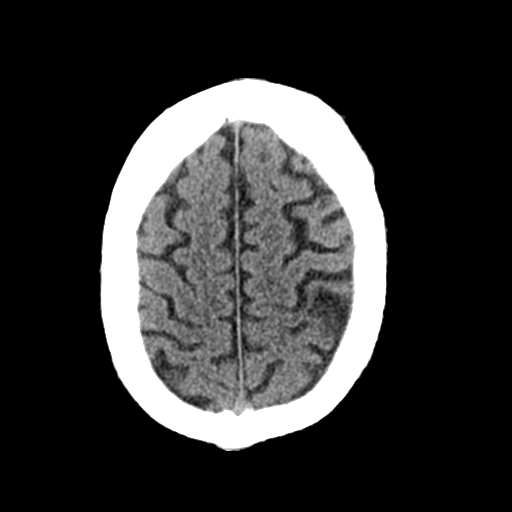
[im 28/32  brain]
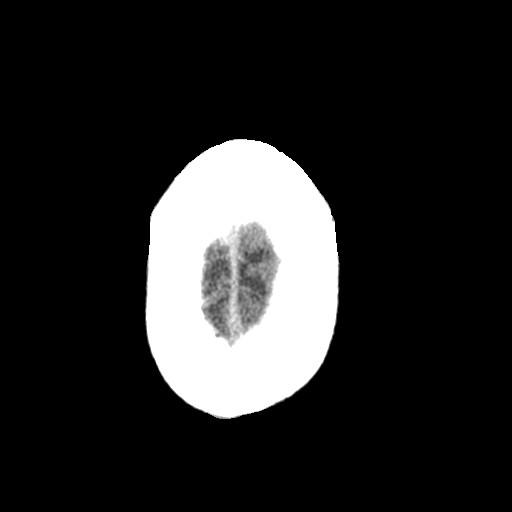

[Series 3: head bone · axial · 0.41mm/px · z∈[+287,+319]mm · 3 of 80 slices shown]
[im 8/80  bone]
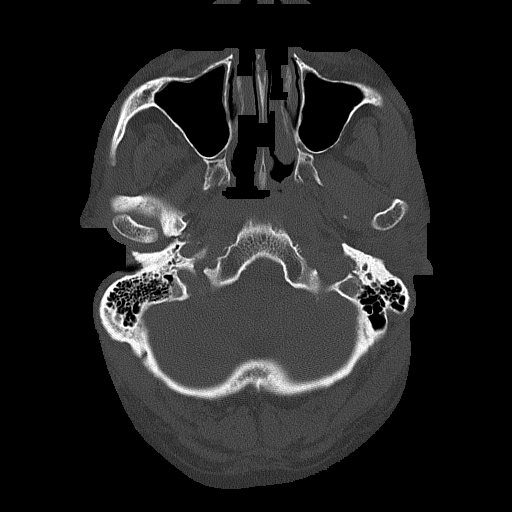
[im 16/80  bone]
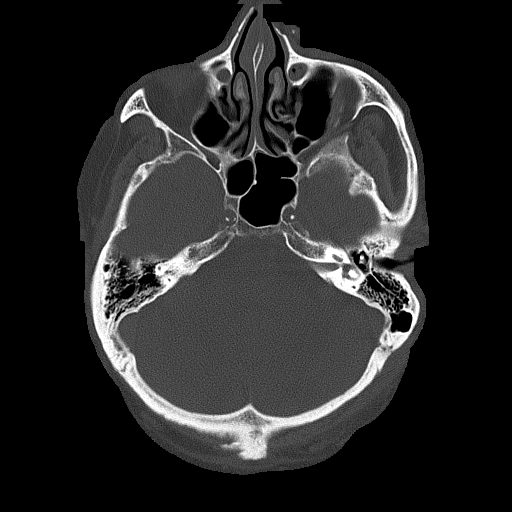
[im 24/80  bone]
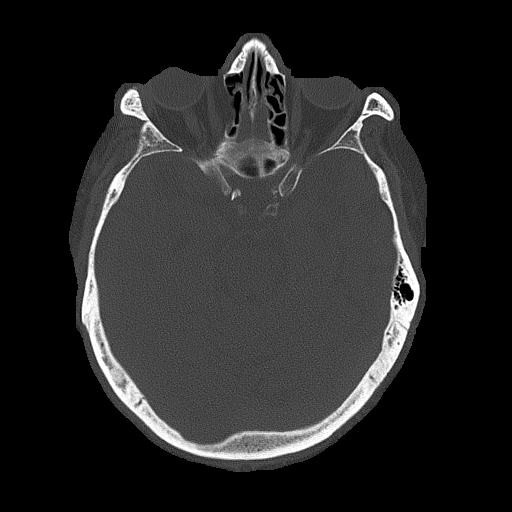

[Series 4: coronal soft tissue · coronal · 0.33mm/px · 3 of 68 slices shown]
[im 23/68  brain]
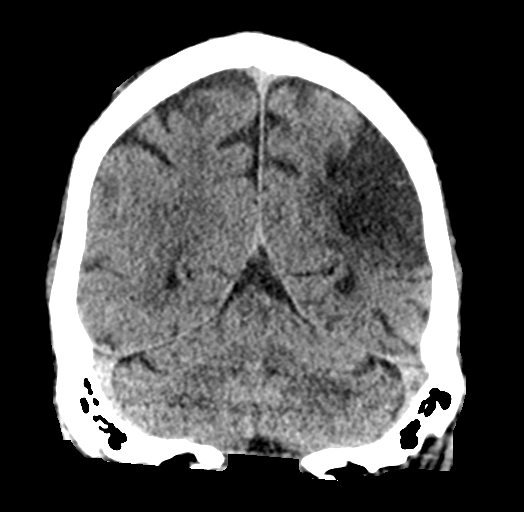
[im 30/68  brain]
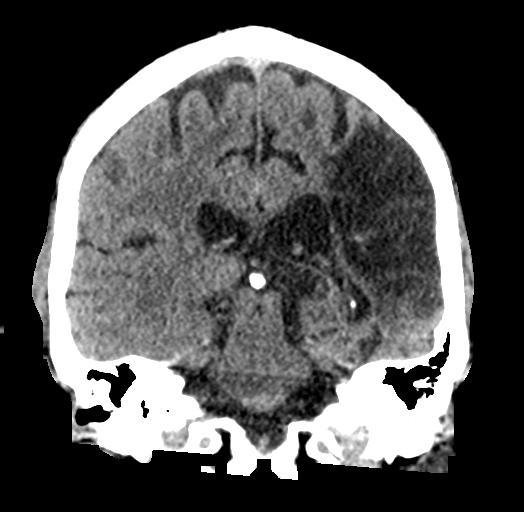
[im 38/68  brain]
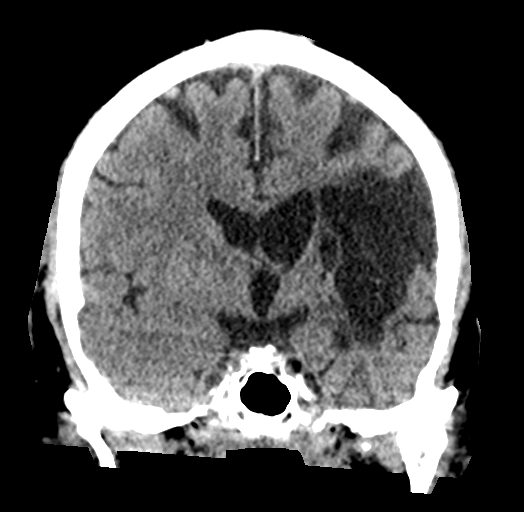

[Series 5: sagittal soft tissue · sagittal · 0.33mm/px · 3 of 56 slices shown]
[im 19/56  brain]
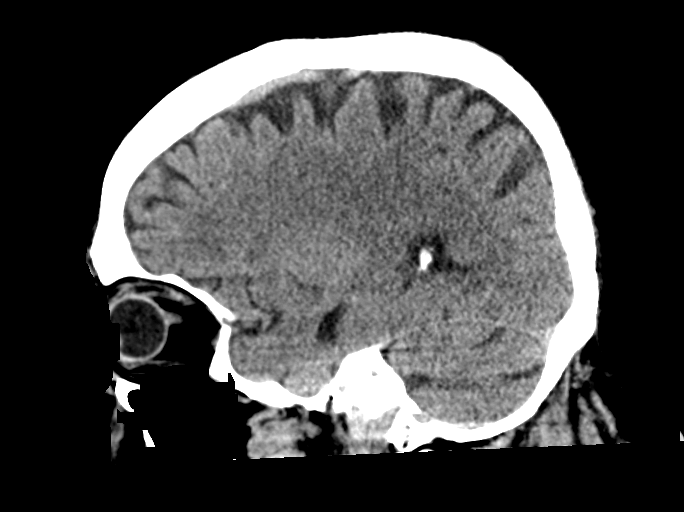
[im 28/56  brain]
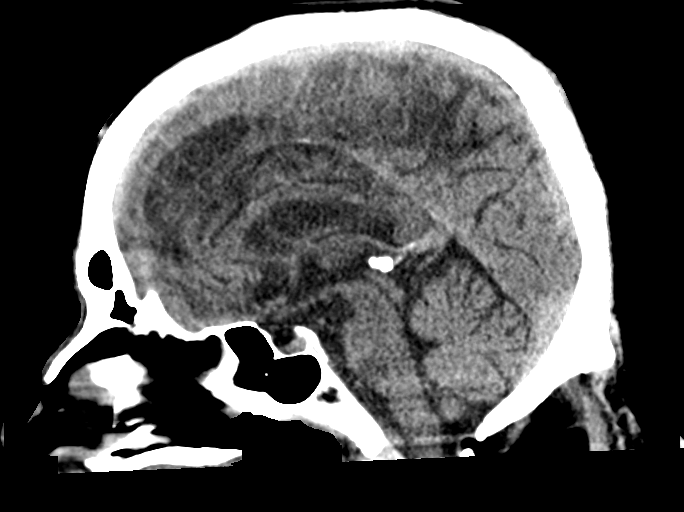
[im 37/56  brain]
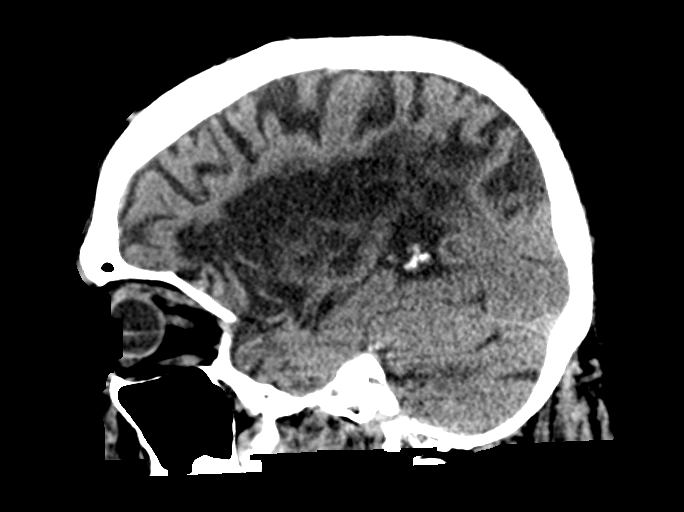

[16 of 47 positions shown; findings below may reference images not displayed]

FINDINGS: Brain:

Redemonstration of large left middle cerebral artery territory
encephalomalacia due to prior infarction. Infarct involves the left
basal ganglia. Patchy and confluent areas of decreased attenuation
are noted throughout the deep and periventricular white matter of
the cerebral hemispheres bilaterally, compatible with chronic
microvascular ischemic disease. No evidence of large-territorial
acute infarction. No parenchymal hemorrhage. No mass lesion. No
extra-axial collection.

No mass effect or midline shift. No hydrocephalus. Basilar cisterns
are patent.

Vascular: No hyperdense vessel. Atherosclerotic calcifications are
present within the cavernous internal carotid arteries.

Skull: No acute fracture or focal lesion.

Sinuses/Orbits: Paranasal sinuses and mastoid air cells are clear.
The orbits are unremarkable.

Other: None.

## 2023-06-28 IMAGING — DX DG CHEST 1V PORT
1 series · 1 of 1 positions shown · non-contrast
Comparison: Chest radiograph dated February 24, 2020

CLINICAL DATA: Low-grade fever and seizures.

EXAM:
PORTABLE CHEST 1 VIEW

[chest ap]
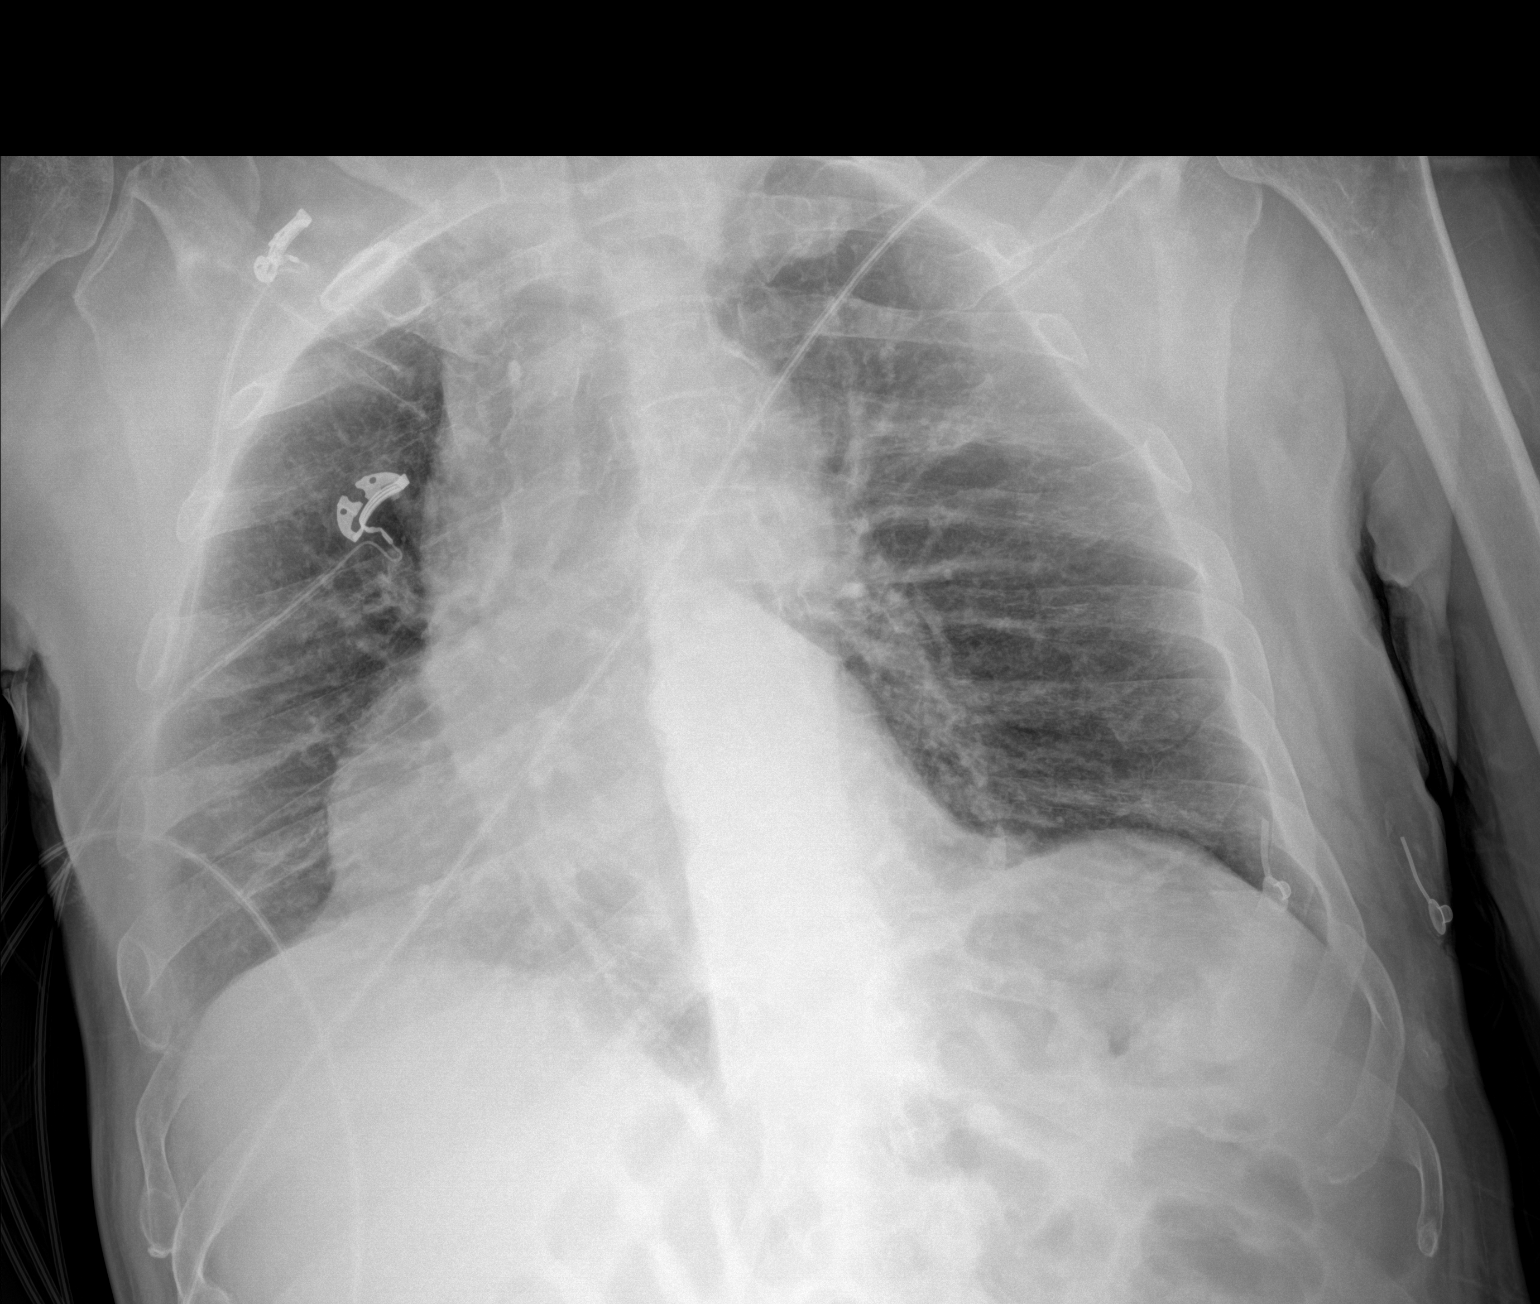

[1 of 1 positions shown; findings below may reference images not displayed]

FINDINGS: The heart is enlarged. Evaluation is suboptimal due to mediastinal
shift to the right. Atherosclerotic calcification of the aortic
arch. Pulmonary vascular congestion. No focal consolidation or large
pleural effusion.
IMPRESSION: Cardiomegaly. Pulmonary vascular congestion. No focal consolidation
or large pleural effusion.

## 2023-09-16 DEATH — deceased
